# Patient Record
Sex: Female | Born: 1950 | Race: White | Hispanic: No | Smoking: Never smoker
Health system: Southern US, Community
[De-identification: ages and names within clinical notes are randomized; demographics above are authoritative.]

## PROBLEM LIST (undated history)

## (undated) DIAGNOSIS — F4310 Post-traumatic stress disorder, unspecified: Secondary | ICD-10-CM

## (undated) DIAGNOSIS — I10 Essential (primary) hypertension: Secondary | ICD-10-CM

## (undated) DIAGNOSIS — F419 Anxiety disorder, unspecified: Secondary | ICD-10-CM

## (undated) DIAGNOSIS — F32A Depression, unspecified: Secondary | ICD-10-CM

## (undated) DIAGNOSIS — F1010 Alcohol abuse, uncomplicated: Secondary | ICD-10-CM

## (undated) HISTORY — DX: Post-traumatic stress disorder, unspecified: F43.10

## (undated) HISTORY — PX: CYSTECTOMY: SUR359

## (undated) HISTORY — DX: Depression, unspecified: F32.A

## (undated) HISTORY — DX: Anxiety disorder, unspecified: F41.9

---

## 2006-03-17 ENCOUNTER — Encounter: Admission: RE | Admit: 2006-03-17 | Discharge: 2006-03-17 | Payer: Self-pay | Admitting: Sports Medicine

## 2006-09-23 ENCOUNTER — Encounter: Admission: RE | Admit: 2006-09-23 | Discharge: 2006-09-23 | Payer: Self-pay | Admitting: Orthopedic Surgery

## 2007-08-22 IMAGING — US US ASPIRATION
1 series · 10 of 10 positions shown · non-contrast
Comparison: none

CLINICAL DATA: Right knee Baker's cysts. Previously aspirated in [place].

ULTRASOUND GUIDED RIGHT BAKER'S CYST ASPIRATION AND STEROID INJECTION:

[Series 1: unknown · 0.07mm/px · 10 of 10 slices shown]
[im 1/10]
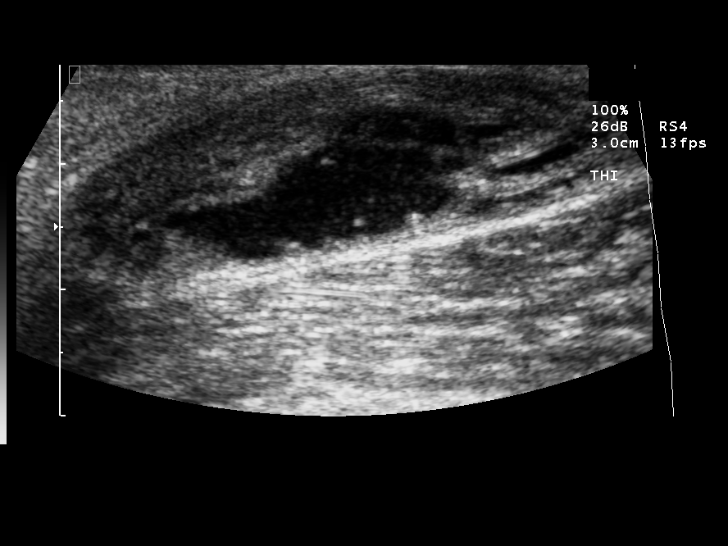
[im 2/10]
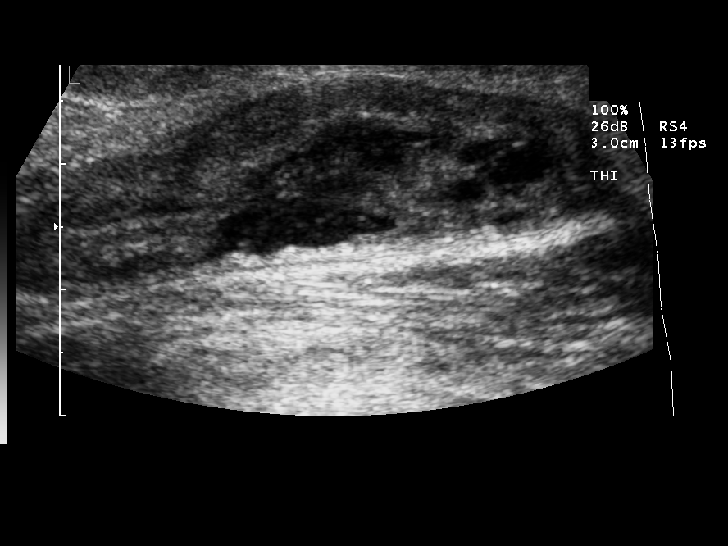
[im 3/10]
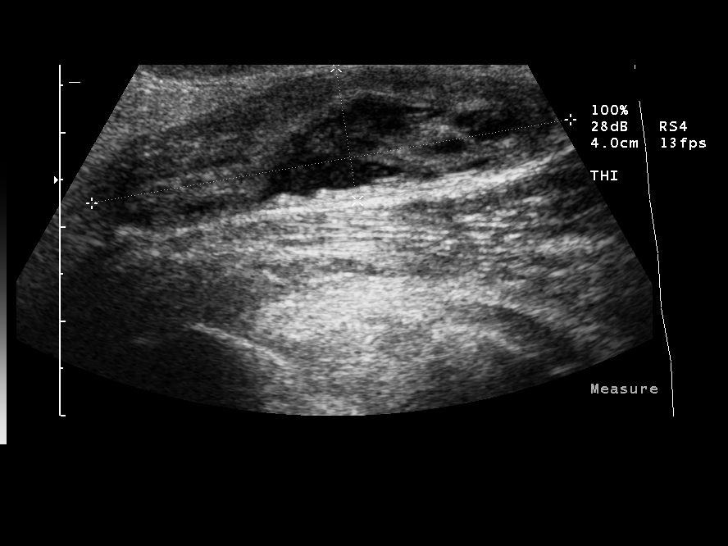
[im 4/10]
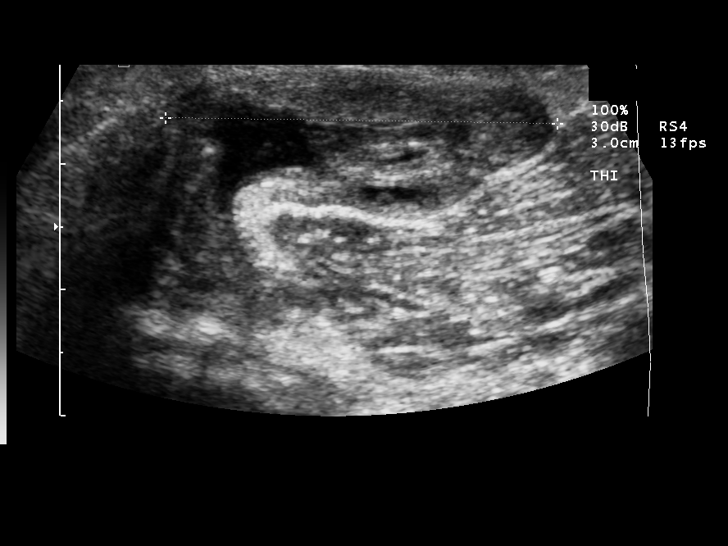
[im 5/10]
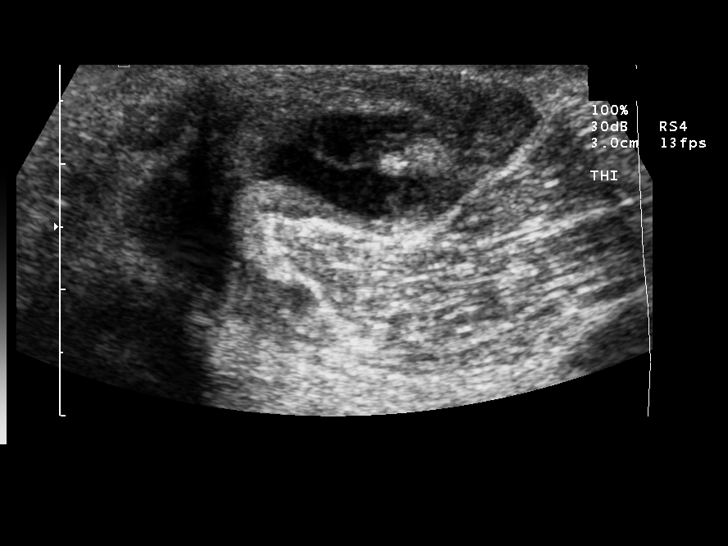
[im 6/10]
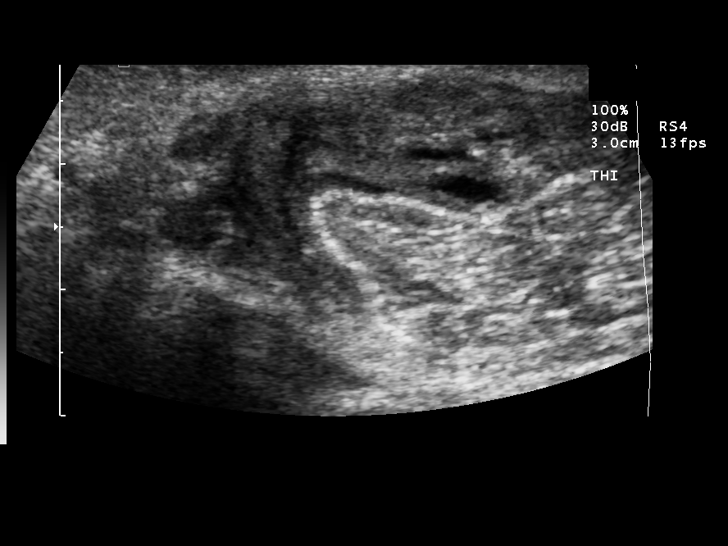
[im 7/10]
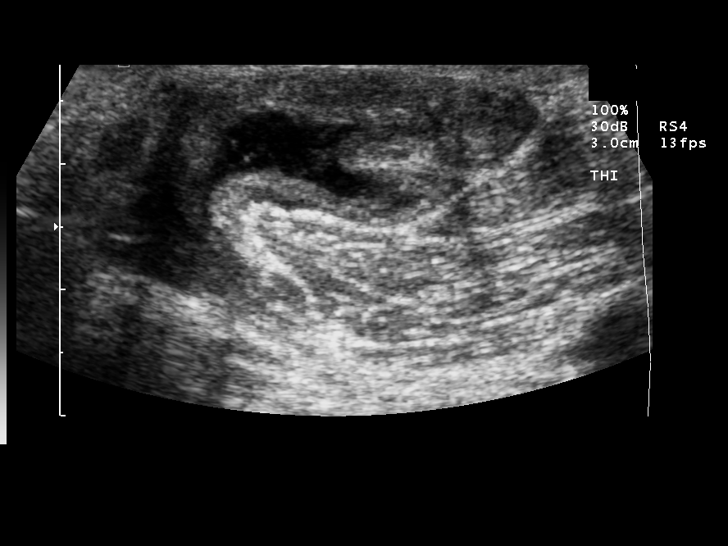
[im 8/10]
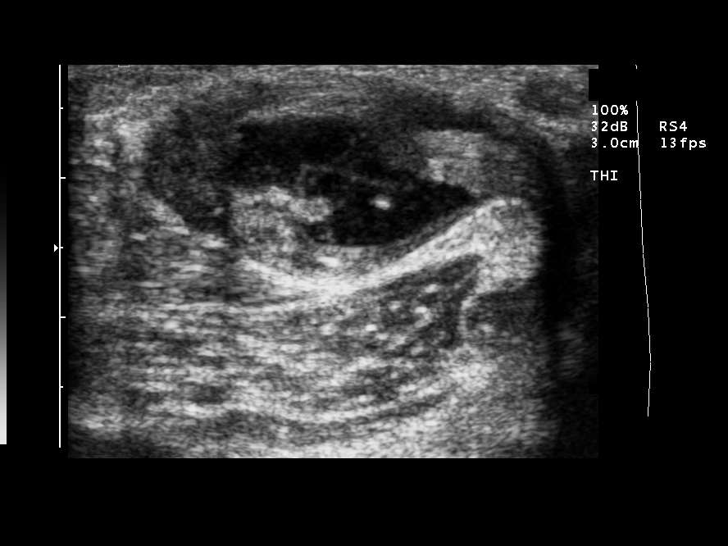
[im 9/10]
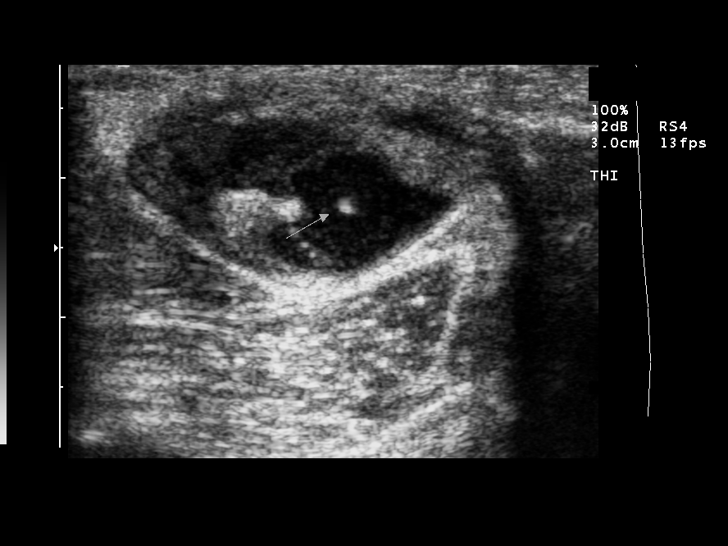
[im 10/10]
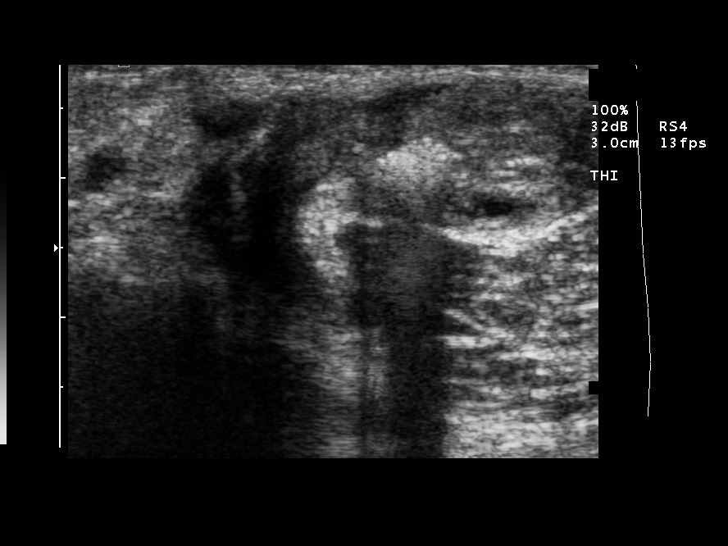

[10 of 10 positions shown; findings below may reference images not displayed]

FINDINGS: Written informed consent was obtained from the patient. The patient
was placed prone and the posterior right knee was prepped and draped in sterile
fashion. The skin was anesthetized with 1% lidocaine. Under ultrasound guidance,
an 18-gauge needle was advanced into the right knee Baker's cyst. Approximately
2-3 cc of somewhat thick amber-colored fluid was aspirated. Ultrasound shows
near complete collapse of the cyst. 80 mg of Depo-Medrol was injected into the
Baker's cyst.

The patient tolerated the procedure well.
IMPRESSION: Successful aspiration and steroid injection of the right knee Baker's cyst as
described above.

## 2022-11-06 ENCOUNTER — Emergency Department (HOSPITAL_COMMUNITY)
Admission: EM | Admit: 2022-11-06 | Discharge: 2022-11-06 | Disposition: A | Discharge status: Home / Self Care | Payer: Medicare PPO | Attending: Emergency Medicine | Admitting: Emergency Medicine

## 2022-11-06 ENCOUNTER — Other Ambulatory Visit: Payer: Self-pay

## 2022-11-06 ENCOUNTER — Encounter (HOSPITAL_COMMUNITY): Payer: Self-pay

## 2022-11-06 DIAGNOSIS — I10 Essential (primary) hypertension: Secondary | ICD-10-CM | POA: Insufficient documentation

## 2022-11-06 DIAGNOSIS — Y902 Blood alcohol level of 40-59 mg/100 ml: Secondary | ICD-10-CM | POA: Insufficient documentation

## 2022-11-06 DIAGNOSIS — E876 Hypokalemia: Secondary | ICD-10-CM | POA: Diagnosis not present

## 2022-11-06 DIAGNOSIS — F1010 Alcohol abuse, uncomplicated: Secondary | ICD-10-CM | POA: Insufficient documentation

## 2022-11-06 HISTORY — DX: Essential (primary) hypertension: I10

## 2022-11-06 HISTORY — DX: Alcohol abuse, uncomplicated: F10.10

## 2022-11-06 LAB — CBC WITH DIFFERENTIAL/PLATELET
Abs Immature Granulocytes: 0.04 10*3/uL (ref 0.00–0.07)
Basophils Absolute: 0.1 10*3/uL (ref 0.0–0.1)
Basophils Relative: 2 %
Eosinophils Absolute: 0.1 10*3/uL (ref 0.0–0.5)
Eosinophils Relative: 1 %
HCT: 39.7 % (ref 36.0–46.0)
Hemoglobin: 13.9 g/dL (ref 12.0–15.0)
Immature Granulocytes: 1 %
Lymphocytes Relative: 25 %
Lymphs Abs: 1.8 10*3/uL (ref 0.7–4.0)
MCH: 32.9 pg (ref 26.0–34.0)
MCHC: 35 g/dL (ref 30.0–36.0)
MCV: 93.9 fL (ref 80.0–100.0)
Monocytes Absolute: 0.5 10*3/uL (ref 0.1–1.0)
Monocytes Relative: 7 %
Neutro Abs: 4.5 10*3/uL (ref 1.7–7.7)
Neutrophils Relative %: 64 %
Platelets: 314 10*3/uL (ref 150–400)
RBC: 4.23 MIL/uL (ref 3.87–5.11)
RDW: 12.6 % (ref 11.5–15.5)
WBC: 7 10*3/uL (ref 4.0–10.5)
nRBC: 0 % (ref 0.0–0.2)

## 2022-11-06 LAB — COMPREHENSIVE METABOLIC PANEL
ALT: 38 U/L (ref 0–44)
AST: 45 U/L — ABNORMAL HIGH (ref 15–41)
Albumin: 4.3 g/dL (ref 3.5–5.0)
Alkaline Phosphatase: 74 U/L (ref 38–126)
Anion gap: 16 — ABNORMAL HIGH (ref 5–15)
BUN: 13 mg/dL (ref 8–23)
CO2: 21 mmol/L — ABNORMAL LOW (ref 22–32)
Calcium: 9.4 mg/dL (ref 8.9–10.3)
Chloride: 100 mmol/L (ref 98–111)
Creatinine, Ser: 0.48 mg/dL (ref 0.44–1.00)
GFR, Estimated: >60 mL/min (ref >60)
Glucose, Bld: 117 mg/dL — ABNORMAL HIGH (ref 70–99)
Potassium: 3 mmol/L — ABNORMAL LOW (ref 3.5–5.1)
Sodium: 137 mmol/L (ref 135–145)
Total Bilirubin: 1.6 mg/dL — ABNORMAL HIGH (ref 0.3–1.2)
Total Protein: 7.6 g/dL (ref 6.5–8.1)

## 2022-11-06 LAB — ETHANOL: Alcohol, Ethyl (B): 54 mg/dL — ABNORMAL HIGH (ref <10)

## 2022-11-06 MED ORDER — SODIUM CHLORIDE 0.9 % IV BOLUS
1000.0000 mL | Freq: Once | INTRAVENOUS | Status: AC
  Administered 2022-11-06: 1000 mL via INTRAVENOUS

## 2022-11-06 MED ORDER — LORAZEPAM 2 MG/ML IJ SOLN
1.0000 mg | Freq: Once | INTRAMUSCULAR | Status: AC
  Administered 2022-11-06: 1 mg via INTRAVENOUS
  Filled 2022-11-06: qty 1

## 2022-11-06 MED ORDER — KETOROLAC TROMETHAMINE 15 MG/ML IJ SOLN
15.0000 mg | Freq: Once | INTRAMUSCULAR | Status: AC
  Administered 2022-11-06: 15 mg via INTRAVENOUS
  Filled 2022-11-06: qty 1

## 2022-11-06 MED ORDER — ONDANSETRON HCL 4 MG/2ML IJ SOLN
4.0000 mg | Freq: Once | INTRAMUSCULAR | Status: AC
  Administered 2022-11-06: 4 mg via INTRAVENOUS
  Filled 2022-11-06: qty 2

## 2022-11-06 MED ORDER — POTASSIUM CHLORIDE CRYS ER 20 MEQ PO TBCR
40.0000 meq | EXTENDED_RELEASE_TABLET | Freq: Once | ORAL | Status: AC
Start: 2022-11-06 — End: 2022-11-06
  Administered 2022-11-06: 40 meq via ORAL
  Filled 2022-11-06: qty 2

## 2022-11-06 MED ORDER — THIAMINE HCL 100 MG/ML IJ SOLN
100.0000 mg | Freq: Once | INTRAMUSCULAR | Status: AC
  Administered 2022-11-06: 100 mg via INTRAVENOUS
  Filled 2022-11-06: qty 2

## 2022-11-06 NOTE — ED Triage Notes (Addendum)
Pt BIB EMS with reports of ETOH. Pt states that she feels shaky and thinks that she has seizures. Pt recently split up with her husband and has been drinking a bunch of wine. Last drink was last night. Clovis Cao)

## 2022-11-06 NOTE — ED Provider Notes (Signed)
[facility]-EMERGENCY DEPT Provider Note: Lowella Dell, MD, FACEP  CSN: [id number] MRN: [id number] ARRIVAL: 11/06/22 at 0209 ROOM: WA12/WA12   CHIEF COMPLAINT  Alcohol Intoxication   HISTORY OF PRESENT ILLNESS  11/06/22 4:49 AM Stephanie Riley is a 71 y.o. female who recently split up with her husband (due to a family problem involving their daughter) and has been drinking wine recently to cope with the associated stress.  Her last drink was yesterday evening.  She is feeling shaky but not severely so.  She has a headache on the top of her head which is not severe and this has been accompanied with nausea.  She is not sure if this is due to drinking too much.  She admits to perhaps drinking more alcohol than she should and would like referrals to treatment programs.   Past Medical History:  Diagnosis Date   Alcohol abuse    HTN (hypertension)     History reviewed. No pertinent surgical history.  History reviewed. No pertinent family history.  Social History   Tobacco Use   Smoking status: Never   Smokeless tobacco: Never  Substance Use Topics   Alcohol use: Yes   Drug use: Never    Prior to Admission medications   Not on File    Allergies Patient has no allergy information on record.   REVIEW OF SYSTEMS  Negative except as noted here or in the History of Present Illness.   PHYSICAL EXAMINATION  Initial Vital Signs Blood pressure (!) 164/89, pulse 90, temperature 98.5 F (36.9 C), temperature source Oral, resp. rate 16, height 5\' 7"  (1.702 m), weight 63.5 kg, SpO2 96 %.  Examination General: Well-developed, well-nourished female in no acute distress; appearance consistent with age of record HENT: normocephalic; atraumatic Eyes: Normal appearance; no nystagmus Neck: supple Heart: regular rate and rhythm Lungs: clear to auscultation bilaterally Abdomen: soft; nondistended; nontender; bowel sounds present Extremities: No deformity; full range of motion; pulses  normal Neurologic: Awake, alert and oriented; motor function intact in all extremities and symmetric; no facial droop; mildly tremulous Skin: Warm and dry Psychiatric: Normal mood and affect   RESULTS  Summary of this visit's results, reviewed and interpreted by myself:   EKG Interpretation  Date/Time:    Ventricular Rate:    PR Interval:    QRS Duration:   QT Interval:    QTC Calculation:   R Axis:     Text Interpretation:         Laboratory Studies: Results for orders placed or performed during the hospital encounter of 11/06/22 (from the past 24 hour(s))  CBC with Differential     Status: None   Collection Time: 11/06/22  2:19 AM  Result Value Ref Range   WBC 7.0 4.0 - 10.5 K/uL   RBC 4.23 3.87 - 5.11 MIL/uL   Hemoglobin 13.9 12.0 - 15.0 g/dL   HCT 14/01/23 65.5 - 37.4 %   MCV 93.9 80.0 - 100.0 fL   MCH 32.9 26.0 - 34.0 pg   MCHC 35.0 30.0 - 36.0 g/dL   RDW 82.7 07.8 - 67.5 %   Platelets 314 150 - 400 K/uL   nRBC 0.0 0.0 - 0.2 %   Neutrophils Relative % 64 %   Neutro Abs 4.5 1.7 - 7.7 K/uL   Lymphocytes Relative 25 %   Lymphs Abs 1.8 0.7 - 4.0 K/uL   Monocytes Relative 7 %   Monocytes Absolute 0.5 0.1 - 1.0 K/uL   Eosinophils Relative 1 %  Eosinophils Absolute 0.1 0.0 - 0.5 K/uL   Basophils Relative 2 %   Basophils Absolute 0.1 0.0 - 0.1 K/uL   Immature Granulocytes 1 %   Abs Immature Granulocytes 0.04 0.00 - 0.07 K/uL  Comprehensive metabolic panel     Status: Abnormal   Collection Time: 11/06/22  2:19 AM  Result Value Ref Range   Sodium 137 135 - 145 mmol/L   Potassium 3.0 (L) 3.5 - 5.1 mmol/L   Chloride 100 98 - 111 mmol/L   CO2 21 (L) 22 - 32 mmol/L   Glucose, Bld 117 (H) 70 - 99 mg/dL   BUN 13 8 - 23 mg/dL   Creatinine, Ser 6.25 0.44 - 1.00 mg/dL   Calcium 9.4 8.9 - 63.8 mg/dL   Total Protein 7.6 6.5 - 8.1 g/dL   Albumin 4.3 3.5 - 5.0 g/dL   AST 45 (H) 15 - 41 U/L   ALT 38 0 - 44 U/L   Alkaline Phosphatase 74 38 - 126 U/L   Total Bilirubin 1.6  (H) 0.3 - 1.2 mg/dL   GFR, Estimated >93 >73 mL/min   Anion gap 16 (H) 5 - 15  Ethanol     Status: Abnormal   Collection Time: 11/06/22  2:19 AM  Result Value Ref Range   Alcohol, Ethyl (B) 54 (H) <10 mg/dL   Imaging Studies: No results found.  ED COURSE and MDM  Nursing notes, initial and subsequent vitals signs, including pulse oximetry, reviewed and interpreted by myself.  Vitals:   11/06/22 0217 11/06/22 0218 11/06/22 0625  BP: (!) 164/89  (!) 152/74  Pulse: 90  98  Resp: 16  16  Temp: 98.5 F (36.9 C)  98 F (36.7 C)  TempSrc: Oral  Oral  SpO2: 96%  98%  Weight:  63.5 kg   Height:  5\' 7"  (1.702 m)    Medications  sodium chloride 0.9 % bolus 1,000 mL (1,000 mLs Intravenous Bolus 11/06/22 0532)  LORazepam (ATIVAN) injection 1 mg (1 mg Intravenous Given 11/06/22 0531)  ketorolac (TORADOL) 15 MG/ML injection 15 mg (15 mg Intravenous Given 11/06/22 0532)  thiamine (VITAMIN B1) injection 100 mg (100 mg Intravenous Given 11/06/22 0531)  ondansetron (ZOFRAN) injection 4 mg (4 mg Intravenous Given 11/06/22 0531)  potassium chloride SA (KLOR-CON M) CR tablet 40 mEq (40 mEq Oral Given 11/06/22 0532)   6:51 AM Patient feeling significantly better after IV fluids and medications.  We will provide outpatient referral options.  She does not appear to be in acute alcohol withdrawal and not in need of medical admission at this time.   PROCEDURES  Procedures   ED DIAGNOSES     ICD-10-CM   1. Alcohol abuse  F10.10     2. Hypokalemia  E87.6          Kaysan Peixoto, 14/1/23, MD 11/06/22 [phone]

## 2023-02-11 ENCOUNTER — Ambulatory Visit (HOSPITAL_COMMUNITY)
Admission: EM | Admit: 2023-02-11 | Discharge: 2023-02-17 | Disposition: A | Discharge status: Home / Self Care | Payer: Medicare PPO | Attending: Student | Admitting: Student

## 2023-02-11 DIAGNOSIS — I10 Essential (primary) hypertension: Secondary | ICD-10-CM | POA: Insufficient documentation

## 2023-02-11 DIAGNOSIS — F909 Attention-deficit hyperactivity disorder, unspecified type: Secondary | ICD-10-CM | POA: Insufficient documentation

## 2023-02-11 DIAGNOSIS — F13239 Sedative, hypnotic or anxiolytic dependence with withdrawal, unspecified: Secondary | ICD-10-CM | POA: Insufficient documentation

## 2023-02-11 DIAGNOSIS — Z1152 Encounter for screening for COVID-19: Secondary | ICD-10-CM | POA: Insufficient documentation

## 2023-02-11 DIAGNOSIS — F13930 Sedative, hypnotic or anxiolytic use, unspecified with withdrawal, uncomplicated: Secondary | ICD-10-CM

## 2023-02-11 DIAGNOSIS — F329 Major depressive disorder, single episode, unspecified: Secondary | ICD-10-CM | POA: Diagnosis not present

## 2023-02-11 DIAGNOSIS — Z79899 Other long term (current) drug therapy: Secondary | ICD-10-CM | POA: Diagnosis not present

## 2023-02-11 DIAGNOSIS — F419 Anxiety disorder, unspecified: Secondary | ICD-10-CM | POA: Diagnosis not present

## 2023-02-11 DIAGNOSIS — F1020 Alcohol dependence, uncomplicated: Secondary | ICD-10-CM | POA: Diagnosis present

## 2023-02-11 LAB — RESP PANEL BY RT-PCR (RSV, FLU A&B, COVID)  RVPGX2
Influenza A by PCR: NEGATIVE
Influenza B by PCR: NEGATIVE
Resp Syncytial Virus by PCR: NEGATIVE
SARS Coronavirus 2 by RT PCR: NEGATIVE

## 2023-02-11 LAB — COMPREHENSIVE METABOLIC PANEL
ALT: 34 U/L (ref 0–44)
AST: 35 U/L (ref 15–41)
Albumin: 4.7 g/dL (ref 3.5–5.0)
Alkaline Phosphatase: 91 U/L (ref 38–126)
Anion gap: 15 (ref 5–15)
BUN: 7 mg/dL — ABNORMAL LOW (ref 8–23)
CO2: 24 mmol/L (ref 22–32)
Calcium: 9.2 mg/dL (ref 8.9–10.3)
Chloride: 97 mmol/L — ABNORMAL LOW (ref 98–111)
Creatinine, Ser: 0.47 mg/dL (ref 0.44–1.00)
GFR, Estimated: >60 mL/min (ref >60)
Glucose, Bld: 97 mg/dL (ref 70–99)
Potassium: 3.2 mmol/L — ABNORMAL LOW (ref 3.5–5.1)
Sodium: 136 mmol/L (ref 135–145)
Total Bilirubin: 1.7 mg/dL — ABNORMAL HIGH (ref 0.3–1.2)
Total Protein: 7.7 g/dL (ref 6.5–8.1)

## 2023-02-11 LAB — ETHANOL: Alcohol, Ethyl (B): 72 mg/dL — ABNORMAL HIGH (ref <10)

## 2023-02-11 LAB — CBC
HCT: 38.8 % (ref 36.0–46.0)
Hemoglobin: 13.4 g/dL (ref 12.0–15.0)
MCH: 31.7 pg (ref 26.0–34.0)
MCHC: 34.5 g/dL (ref 30.0–36.0)
MCV: 91.7 fL (ref 80.0–100.0)
Platelets: 422 10*3/uL — ABNORMAL HIGH (ref 150–400)
RBC: 4.23 MIL/uL (ref 3.87–5.11)
RDW: 13 % (ref 11.5–15.5)
WBC: 9.2 10*3/uL (ref 4.0–10.5)
nRBC: 0 % (ref 0.0–0.2)

## 2023-02-11 LAB — MAGNESIUM: Magnesium: 2 mg/dL (ref 1.7–2.4)

## 2023-02-11 LAB — TSH: TSH: 3.115 u[IU]/mL (ref 0.350–4.500)

## 2023-02-11 LAB — VITAMIN B12: Vitamin B-12: 252 pg/mL (ref 180–914)

## 2023-02-11 LAB — PHOSPHORUS: Phosphorus: 3.7 mg/dL (ref 2.5–4.6)

## 2023-02-11 LAB — FOLATE: Folate: 6.3 ng/mL (ref >5.9)

## 2023-02-11 MED ORDER — MAGNESIUM HYDROXIDE 400 MG/5ML PO SUSP: 30.0000 mL | Freq: Every day | ORAL | Status: DC | PRN

## 2023-02-11 MED ORDER — LORAZEPAM 1 MG PO TABS
1.0000 mg | ORAL_TABLET | Freq: Four times a day (QID) | ORAL | Status: AC | PRN
  Administered 2023-02-11 – 2023-02-12 (×2): 1 mg via ORAL
  Filled 2023-02-11 (×2): qty 1

## 2023-02-11 MED ORDER — ONDANSETRON 4 MG PO TBDP
ORAL_TABLET | ORAL | Status: AC
  Filled 2023-02-11: qty 1

## 2023-02-11 MED ORDER — ADULT MULTIVITAMIN W/MINERALS CH
1.0000 | ORAL_TABLET | Freq: Every day | ORAL | Status: DC
  Administered 2023-02-11 – 2023-02-17 (×7): 1 via ORAL
  Filled 2023-02-11 (×7): qty 1

## 2023-02-11 MED ORDER — AMLODIPINE BESYLATE 10 MG PO TABS
10.0000 mg | ORAL_TABLET | Freq: Every day | ORAL | Status: DC
  Administered 2023-02-11 – 2023-02-16 (×6): 10 mg via ORAL
  Filled 2023-02-11 (×6): qty 1

## 2023-02-11 MED ORDER — ONDANSETRON 4 MG PO TBDP
4.0000 mg | ORAL_TABLET | Freq: Once | ORAL | Status: AC
  Administered 2023-02-11: 4 mg via ORAL

## 2023-02-11 MED ORDER — CLONAZEPAM 0.5 MG PO TABS
0.5000 mg | ORAL_TABLET | Freq: Two times a day (BID) | ORAL | Status: DC
  Administered 2023-02-11 – 2023-02-17 (×12): 0.5 mg via ORAL
  Filled 2023-02-11 (×12): qty 1

## 2023-02-11 MED ORDER — ACETAMINOPHEN 325 MG PO TABS: 650.0000 mg | ORAL_TABLET | Freq: Four times a day (QID) | ORAL | Status: DC | PRN

## 2023-02-11 MED ORDER — POTASSIUM CHLORIDE CRYS ER 20 MEQ PO TBCR
30.0000 meq | EXTENDED_RELEASE_TABLET | Freq: Once | ORAL | Status: AC
  Administered 2023-02-11: 30 meq via ORAL
  Filled 2023-02-11: qty 1

## 2023-02-11 MED ORDER — HYDROXYZINE HCL 25 MG PO TABS: 25.0000 mg | ORAL_TABLET | Freq: Three times a day (TID) | ORAL | Status: DC | PRN

## 2023-02-11 MED ORDER — ALUM & MAG HYDROXIDE-SIMETH 200-200-20 MG/5ML PO SUSP
30.0000 mL | ORAL | Status: DC | PRN
  Administered 2023-02-13: 30 mL via ORAL
  Filled 2023-02-11: qty 30

## 2023-02-11 MED ORDER — CLONIDINE HCL 0.1 MG PO TABS: 0.1000 mg | ORAL_TABLET | Freq: Once | ORAL | Status: DC

## 2023-02-11 MED ORDER — LOPERAMIDE HCL 2 MG PO CAPS
2.0000 mg | ORAL_CAPSULE | ORAL | Status: AC | PRN
  Administered 2023-02-12 – 2023-02-14 (×3): 4 mg via ORAL
  Filled 2023-02-11 (×3): qty 2

## 2023-02-11 MED ORDER — THIAMINE HCL 100 MG/ML IJ SOLN
100.0000 mg | Freq: Once | INTRAMUSCULAR | Status: AC
  Administered 2023-02-11: 100 mg via INTRAMUSCULAR
  Filled 2023-02-11: qty 2

## 2023-02-11 MED ORDER — LOSARTAN POTASSIUM 50 MG PO TABS
100.0000 mg | ORAL_TABLET | Freq: Every day | ORAL | Status: DC
  Administered 2023-02-12 – 2023-02-17 (×6): 100 mg via ORAL
  Filled 2023-02-11 (×6): qty 2

## 2023-02-11 MED ORDER — HYDROXYZINE HCL 25 MG PO TABS
25.0000 mg | ORAL_TABLET | Freq: Four times a day (QID) | ORAL | Status: AC | PRN
  Administered 2023-02-11 – 2023-02-12 (×4): 25 mg via ORAL
  Filled 2023-02-11 (×5): qty 1

## 2023-02-11 MED ORDER — HYDROCHLOROTHIAZIDE 12.5 MG PO TABS
12.5000 mg | ORAL_TABLET | Freq: Every day | ORAL | Status: DC
  Administered 2023-02-12 – 2023-02-17 (×6): 12.5 mg via ORAL
  Filled 2023-02-11 (×6): qty 1

## 2023-02-11 MED ORDER — LOSARTAN POTASSIUM-HCTZ 100-12.5 MG PO TABS: 1.0000 | ORAL_TABLET | Freq: Every day | ORAL | Status: DC

## 2023-02-11 MED ORDER — THIAMINE MONONITRATE 100 MG PO TABS
100.0000 mg | ORAL_TABLET | Freq: Every day | ORAL | Status: DC
  Administered 2023-02-12 – 2023-02-17 (×6): 100 mg via ORAL
  Filled 2023-02-11 (×6): qty 1

## 2023-02-11 MED ORDER — ONDANSETRON 4 MG PO TBDP
4.0000 mg | ORAL_TABLET | Freq: Four times a day (QID) | ORAL | Status: AC | PRN
  Administered 2023-02-12: 4 mg via ORAL
  Filled 2023-02-11: qty 1

## 2023-02-11 NOTE — BH Assessment (Signed)
Stephanie Riley presented to [facility] with EMS with report that patient was a direct admit to [facility]. TTS did not complete triage however, pt was seen by provider and admitted to [facility].

## 2023-02-11 NOTE — ED Notes (Signed)
Patient arrived on unit. Patient conversing with pharmacy. Patient calm and relaxed in assigned are. Patient safe on unit with proper behavior.

## 2023-02-11 NOTE — ED Notes (Signed)
Pt is in the bed sleeping. Respirations are even and unlabored. No acute distress noted. Will continue to monitor for safety. 

## 2023-02-11 NOTE — ED Notes (Signed)
Pt awake, alert disorganized. She has had an incontinent episode and is having difficulty following directions. Tremors noted. Medicated with Klonopin per order. Assisted pt in shower and with clean scrubs. Bed linens replaced. Instructed pt to rest in recliner bed. She denies SI/HI/AVH.  Will continue to monitor for safety

## 2023-02-11 NOTE — ED Provider Notes (Signed)
[facility] Continuous Assessment Admission H&P  Date: 02/11/23 Patient Name: Stephanie Riley MRN: [id number] Chief Complaint: "Wants help with alcohol detox after long term BZD use."  Diagnoses:  Final diagnoses:  None    HPI: Stephanie Riley is a 72 year old female BIBEMS from home for admission to the [facility] for benzodiazepine and alcohol withdrawal. 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 After 30 years on Klonopin, the decision was made to taper off during that admission. Patient was discharged with Ativan 0.5 mg BID x 15 days, which completed on 2/24.Since then, she has had worsening anxiety, and has thus increased her alcohol consumption. She now drinks anywhere between "a couple of beers or an entire box of wine" on most days.   EMT also reports that patient has been tremulous, although steady in gait. Patient reports that she would like help with returning to taking klonopin and discontinuing alcohol intake, pursuing rehabilitation.   Patient's current outpatient medications: Trazodone 100 mg qHS Zoloft 100 mg daily Losartan-HCTZ 100-12.5 mg daily Wellbutrin XL 150 mg daily Adderall '40mg'$  daily Amlodipine 10 mg daily  Additionally, she was discharged from [place] with a 15-day supply of the following: Zyprexa 15 mg Ativan 0.5 mg BID Lamictal 50 mg  Patient denies SI, HI, AVH today, and does not voice delusions.   Total Time spent with patient: 30 minutes  Musculoskeletal  Strength & Muscle Tone: within normal limits Gait & Station:  assessed on hospital stretcher Patient leans:  assessed on hospital stretcher  Psychiatric Specialty Exam  Presentation General Appearance: Appropriate for Environment; Casual  Eye Contact:Good  Speech:Clear and Coherent; Slow  Speech Volume:Normal  Handedness:Right   Mood and Affect  Mood:Anxious  Affect:Congruent   Thought Process  Thought Processes:Coherent; Goal  Directed  Descriptions of Associations:Intact  Orientation:Full (Time, Place and Person)  Thought Content:WDL; Logical    Hallucinations:Hallucinations: None  Ideas of Reference:None  Suicidal Thoughts:Suicidal Thoughts: No  Homicidal Thoughts:Homicidal Thoughts: No   Sensorium  Memory:Immediate Good; Recent Fair  Judgment:Impaired  Insight:Lacking   Executive Functions  Concentration:Fair  Attention Span:Fair  [place]   Psychomotor Activity  Psychomotor Activity:Psychomotor Activity: Normal (No tremors, no asterixis.)   Assets  Assets:Desire for Improvement; Housing   Sleep  Sleep:Sleep: Fair   Nutritional Assessment (For OBS and FBC admissions only) Has the patient had a weight loss or gain of 10 pounds or more in the last 3 months?: No Has the patient had a decrease in food intake/or appetite?: No Does the patient have dental problems?: No Does the patient have eating habits or behaviors that may be indicators of an eating disorder including binging or inducing vomiting?: No Has the patient recently lost weight without trying?: 0 Has the patient been eating poorly because of a decreased appetite?: 0 Malnutrition Screening Tool Score: 0    Physical Exam Vitals reviewed.  Constitutional:      General: She is not in acute distress.    Appearance: She is diaphoretic.  HENT:     Head: Normocephalic and atraumatic.     Mouth/Throat:     Mouth: Mucous membranes are moist.     Pharynx: Oropharynx is clear.  Pulmonary:     Effort: Pulmonary effort is normal.  Skin:    General: Skin is warm.  Neurological:     Mental Status: She is alert and oriented to person, place, and time.     Comments:  No tremors of outstretched hands nor periorally. No asterixis. Strength of BUE intact. Some abnormal perioral movements c/w tardive dyskinesia    Review of Systems  Constitutional:  Positive for diaphoresis.  Negative for chills.  Genitourinary:  Positive for urgency.       Incontinence  Neurological:  Positive for dizziness and tremors. Negative for headaches.    Blood pressure (!) 158/80, pulse 87, temperature 98.9 F (37.2 C), temperature source Oral, resp. rate 18, SpO2 98 %. There is no height or weight on file to calculate BMI.  Past Psychiatric History: Documented Anxiety, alcohol use, ADHD- inattentive type   Is the patient at risk to self? No  Has the patient been a risk to self in the past 6 months? No .    Has the patient been a risk to self within the distant past? No   Is the patient a risk to others? No   Has the patient been a risk to others in the past 6 months? No   Has the patient been a risk to others within the distant past? No   Past Medical History: See chart  Family History: See chart  Social History: Separated from husband. Lives alone.   Last Labs:  Admission on 11/06/2022, Discharged on 11/06/2022  Component Date Value Ref Range Status  . WBC 11/06/2022 7.0  4.0 - 10.5 K/uL Final  . RBC 11/06/2022 4.23  3.87 - 5.11 MIL/uL Final  . Hemoglobin 11/06/2022 13.9  12.0 - 15.0 g/dL Final  . HCT 11/06/2022 39.7  36.0 - 46.0 % Final  . MCV 11/06/2022 93.9  80.0 - 100.0 fL Final  . MCH 11/06/2022 32.9  26.0 - 34.0 pg Final  . MCHC 11/06/2022 35.0  30.0 - 36.0 g/dL Final  . RDW 11/06/2022 12.6  11.5 - 15.5 % Final  . Platelets 11/06/2022 314  150 - 400 K/uL Final  . nRBC 11/06/2022 0.0  0.0 - 0.2 % Final  . Neutrophils Relative % 11/06/2022 64  % Final  . Neutro Abs 11/06/2022 4.5  1.7 - 7.7 K/uL Final  . Lymphocytes Relative 11/06/2022 25  % Final  . Lymphs Abs 11/06/2022 1.8  0.7 - 4.0 K/uL Final  . Monocytes Relative 11/06/2022 7  % Final  . Monocytes Absolute 11/06/2022 0.5  0.1 - 1.0 K/uL Final  . Eosinophils Relative 11/06/2022 1  % Final  . Eosinophils Absolute 11/06/2022 0.1  0.0 - 0.5 K/uL Final  . Basophils Relative 11/06/2022 2  % Final  . Basophils  Absolute 11/06/2022 0.1  0.0 - 0.1 K/uL Final  . Immature Granulocytes 11/06/2022 1  % Final  . Abs Immature Granulocytes 11/06/2022 0.04  0.00 - 0.07 K/uL Final   Performed at [facility], [place] [address], [place], [place] [address]  . Sodium 11/06/2022 137  135 - 145 mmol/L Final  . Potassium 11/06/2022 3.0 (L)  3.5 - 5.1 mmol/L Final  . Chloride 11/06/2022 100  98 - 111 mmol/L Final  . CO2 11/06/2022 21 (L)  22 - 32 mmol/L Final  . Glucose, Bld 11/06/2022 117 (H)  70 - 99 mg/dL Final   Glucose reference range applies only to samples taken after fasting for at least 8 hours.  . BUN 11/06/2022 13  8 - 23 mg/dL Final  . Creatinine, Ser 11/06/2022 0.48  0.44 - 1.00 mg/dL Final  . Calcium 11/06/2022 9.4  8.9 - 10.3 mg/dL Final  . Total Protein 11/06/2022 7.6  6.5 - 8.1  g/dL Final  . Albumin 11/06/2022 4.3  3.5 - 5.0 g/dL Final  . AST 11/06/2022 45 (H)  15 - 41 U/L Final  . ALT 11/06/2022 38  0 - 44 U/L Final  . Alkaline Phosphatase 11/06/2022 74  38 - 126 U/L Final  . Total Bilirubin 11/06/2022 1.6 (H)  0.3 - 1.2 mg/dL Final  . GFR, Estimated 11/06/2022 >60  >60 mL/min Final   Comment: (NOTE) Calculated using the CKD-EPI Creatinine Equation (2021)   . Anion gap 11/06/2022 16 (H)  5 - 15 Final   Performed at [facility], [place] [address] [place], [place] [address]  . Alcohol, Ethyl (B) 11/06/2022 54 (H)  <10 mg/dL Final   Comment: (NOTE) Lowest detectable limit for serum alcohol is 10 mg/dL.  For medical purposes only. Performed at [facility], [place] [address], [place],  [address]     Allergies: Patient has no known allergies.  Medications:  Facility Ordered Medications  Medication  . [COMPLETED] ondansetron (ZOFRAN-ODT) disintegrating tablet 4 mg  . ondansetron (ZOFRAN-ODT) 4 MG disintegrating tablet  . acetaminophen (TYLENOL) tablet 650 mg  . alum & mag hydroxide-simeth (MAALOX/MYLANTA) 200-200-20 MG/5ML  suspension 30 mL  . magnesium hydroxide (MILK OF MAGNESIA) suspension 30 mL  . thiamine (VITAMIN B1) injection 100 mg  . [START ON 02/12/2023] thiamine (VITAMIN B1) tablet 100 mg  . multivitamin with minerals tablet 1 tablet  . LORazepam (ATIVAN) tablet 1 mg  . hydrOXYzine (ATARAX) tablet 25 mg  . loperamide (IMODIUM) capsule 2-4 mg  . ondansetron (ZOFRAN-ODT) disintegrating tablet 4 mg   PTA Medications  Medication Sig  . losartan-hydrochlorothiazide (HYZAAR) 100-12.5 MG tablet Take 1 tablet by mouth daily.  Marland Kitchen amphetamine-dextroamphetamine (ADDERALL) 20 MG tablet Take 20 mg by mouth 2 (two) times daily.  Marland Kitchen buPROPion (WELLBUTRIN XL) 150 MG 24 hr tablet Take 150 mg by mouth every morning.  Marland Kitchen amLODipine (NORVASC) 10 MG tablet Take 10 mg by mouth daily.  Marland Kitchen LORazepam (ATIVAN) 0.5 MG tablet Take 0.5 mg by mouth in the morning and at bedtime.  . sertraline (ZOLOFT) 100 MG tablet Take 200 mg by mouth daily.  . traZODone (DESYREL) 100 MG tablet Take 100 mg by mouth at bedtime.    Medical Decision Making  Patient brought in voluntarily via EMS. Due to age, longstanding BZD use history with recent discontinuation after short (2 week) taper, and use of other seizure-threshold lowering agents (Wellbutrin, Adderall, and alcohol), patient at high risk of decompensation. As she was brought in via EMS, will obtain labs and start CIWA monitoring with PRN Ativan and hydroxyzine. At this time, patient fully oriented, able to ambulate, and not tremulous. Will keep close monitoring if her. If labs are abnormal and patient appears to decompensate clinically, low threshold to send to the ED for management. If patient remains stable, will admit to [facility] for detox and residential substance use treatment.  Home medications: -Hold home Adderall and Wellbutrin in the setting of acute alcohol intoxication - Restart home antihypertensives: Losartan-HCTZ 100-12.5 mg and Amlodipine 10 mg - Restart home psychotropics: Zoloft  100 mg. Will move Trazodone 100 mg to PRN; use with caution in this elderly patient as alpha-1 adrenergic activity can make patients more prone to dizziness and falls.  -Start CIWA protocol for monitoring of withdrawal with IM thiamine x 1 and MVI replacement and Ativan 1mg  for scores >10   Clinical Course as of 02/12/23 0827  Fri Feb 12, 2023  0827 Temp:  98.4 F (36.9 C) [CC]    Clinical Course User Index [CC] Rosezetta Schlatter, MD    Recommendations  See above for recommendations.  Rosezetta Schlatter, MD 02/11/23  3:48 PM

## 2023-02-11 NOTE — ED Provider Notes (Signed)
Informed pt is hypertensive by nursing staff. B/P 195/102 at 1718. Was given ativan '1mg'$  at 1726. Spoke w/ pt who denies headache, dizziness, blurry vision. She states she does feel tremulous which she feels is associated with withdrawal. She states she takes amlodipine and losartan-hydrochlorothiazide for blood pressure at home. She states she took the losartan-hydrochlorothiazide today but did not take the amlodipine. Will order home amlodipine '10mg'$  for today and losartan-hydrochlorothiazide 100-12.'5mg'$  for tomorrow.

## 2023-02-11 NOTE — ED Notes (Signed)
Patient was admitted to OBS unit. Patient was brought in by EMS via stretcher. Patient is ambulatory but reported she was had an unsteady gait. Writer walked with patient to restroom due to patient voiding on the stretcher she reported she could not hold it. Patient was alert and oriented x 4. Patient reported she was nauseated. Patient had received Zofran 4 mg IV push from EMS and given 4 mg of Zofran from Probation officer. Patient reported she had drank 3 beers prior to calling EMS to pick her up. Patient was able to ambulate without difficulty. Patient was anxious and slightly jittery while her blood was being drawn and EKG. Patient denies SI/HI and AVH. Patient reported she was having a difficult time going through withdrawals from the benzos.

## 2023-02-11 NOTE — Progress Notes (Signed)
Patient remains incontinent of bowel and bladder since arrival to facility.  She is unsteady on her feet and considered to be a high fall risk.

## 2023-02-12 DIAGNOSIS — F329 Major depressive disorder, single episode, unspecified: Secondary | ICD-10-CM | POA: Diagnosis not present

## 2023-02-12 DIAGNOSIS — F1020 Alcohol dependence, uncomplicated: Secondary | ICD-10-CM

## 2023-02-12 DIAGNOSIS — F13930 Sedative, hypnotic or anxiolytic use, unspecified with withdrawal, uncomplicated: Secondary | ICD-10-CM

## 2023-02-12 HISTORY — DX: Sedative, hypnotic or anxiolytic use, unspecified with withdrawal, uncomplicated: F13.930

## 2023-02-12 MED ORDER — TRAZODONE HCL 100 MG PO TABS
100.0000 mg | ORAL_TABLET | Freq: Every evening | ORAL | Status: DC | PRN
  Administered 2023-02-13: 100 mg via ORAL
  Filled 2023-02-12: qty 1

## 2023-02-12 MED ORDER — SERTRALINE HCL 100 MG PO TABS
200.0000 mg | ORAL_TABLET | Freq: Every day | ORAL | Status: DC
  Administered 2023-02-12 – 2023-02-17 (×6): 200 mg via ORAL
  Filled 2023-02-12 (×6): qty 2

## 2023-02-12 NOTE — Progress Notes (Signed)
Patient complained of diarrhea and was given '4mg'$  imodium for symptoms relief.  Stephanie Riley
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
## 2023-02-12 NOTE — Progress Notes (Signed)
[name] CIWA was 2. °

## 2023-02-12 NOTE — ED Notes (Signed)
Pt a/o. Disorganized at times. Watching tv with peers. Denies SI/HI/AVH. Denies c/o withdrawal @ present time. No noted distress. Will continue to monitor for safety

## 2023-02-12 NOTE — ED Provider Notes (Signed)
The patient is a 72 year old female, with a history of alcohol use disorder and chronic benzodiazepine use.  No history of psychiatric admissions or psychiatric care documented in the medical record.  She presented to the [facility] behavioral urgent care reporting excessive alcohol use in the context of being taken off of her benzodiazepines.  On assessment 3/8, the patient exhibits a linear and logical thought process.  She appears confused at times during the interview, but this seems to be a significant improvement compared to her mental status yesterday.  Relevant social history obtained by the patient's report.  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  (Her story at times is not logical and I have a high suspicion that she has had severe alcohol use disorder for a long period of time).  She reports that she was separated from her husband in July but that he comes over "every day, walks the dog, and we chat".  She reports that they are doing marriage counseling right now.  The patient denies issues with depression recently and says she has no suicidal thoughts now or in the recent past.  She denies experiencing any withdrawal.  Her memory appears impaired, both short-term and long-term.  Regarding the plan, the patient states that she wants to remain on her longtime dose of Klonopin 0.5 mg twice daily.  She states that she then wants to follow-up outpatient with her primary care doctor and perhaps a psychiatrist.  She states that she does not have a problem with alcohol and is not interested in substance use treatment with residential rehab or CD IOP.  She requests to stay on Wellbutrin.  Discussed with her that we cannot prescribe Wellbutrin given her recent alcohol use issues and recent issues with benzodiazepine use, and the concern  for seizure as a result.   Corky Sox, MD PGY-2

## 2023-02-12 NOTE — ED Notes (Signed)
Pt sleeping in room. RR even and unlabored. Will continue to monitor for safety 

## 2023-02-12 NOTE — ED Notes (Signed)
Snacks given 

## 2023-02-12 NOTE — ED Notes (Signed)
Rn came to take patient to fbc.

## 2023-02-12 NOTE — ED Notes (Signed)
Patient alert and oriented x 3. Denies SI/HI/AVH. Denies intent or plan to harm self or others. Routine conducted according to faculty protocol. Encourage patient to notify staff with any needs or concerns. Patient verbalized agreement and understanding. Will continue to monitor for safety. 
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
## 2023-02-12 NOTE — ED Notes (Signed)
Rn gave report to fbc states that they will come get her shortly.

## 2023-02-12 NOTE — Progress Notes (Signed)
Pt went to the courtyard for fresh air.

## 2023-02-12 NOTE — ED Notes (Signed)
Pt accompanied to [place] from OBS unit. Gait steady.  Pt oriented to milieu and rules.  Signed consents and skin assessment performed.  Pt denies SI, HI or AVH at this time.  She spoke with Dr. Alvie Heidelberg and attended group.   Q 15 min checks initiated.

## 2023-02-12 NOTE — ED Notes (Signed)
Pt is currently sleeping, no distress noted, environmental check complete, will continue to monitor patient for safety. ? ?

## 2023-02-12 NOTE — Progress Notes (Signed)
Pt had lunch and is currently watching TV. No distress noted or concerns voiced. Staff will monitor for pt's safety.

## 2023-02-12 NOTE — ED Notes (Signed)
Patient  sleeping in no acute stress. RR even and unlabored .Environment secured .Will continue to monitor for safely. 

## 2023-02-13 DIAGNOSIS — F329 Major depressive disorder, single episode, unspecified: Secondary | ICD-10-CM | POA: Diagnosis not present

## 2023-02-13 MED ORDER — TRAZODONE HCL 100 MG PO TABS
100.0000 mg | ORAL_TABLET | Freq: Every day | ORAL | Status: DC
  Administered 2023-02-13 – 2023-02-16 (×4): 100 mg via ORAL
  Filled 2023-02-13 (×4): qty 1

## 2023-02-13 NOTE — ED Notes (Signed)
Pt is currently sleeping, no distress noted, environmental check complete, will continue to monitor patient for safety. ? ?

## 2023-02-13 NOTE — ED Notes (Signed)
Pt sleeping in bed. RR even and unlabored. No noted distress. Will continue to monitor for safety

## 2023-02-13 NOTE — ED Provider Notes (Cosign Needed Addendum)
Behavioral Health Progress Note  Date and Time: 02/13/2023 1:42 PM Name: Stephanie Riley MRN:  [id number]  Subjective:  Stephanie Riley is a 72 yo patient with a PPH of MDD, Etoh use disorder, ADHD, anxiety, and chronic prescribed BZD use. She presented to the ED endorsing withdrawal symptoms and wish to receive medication assisted withdrawal from BZDs. Patient had inpatient hospitalization 12/2022 for Etoh use disorder where there was concern that patient was fixated on titrating off Klonopin but minimized her Etoh intake, which providers felt was more concerning. Per EMR, patient endorsed 1 bottle of wine a night being normal intake. Patient was noted to have withdrawals 2/2 to Etoh (diarrhea, HTN, and nausea). There was also concern that patient was having falls at home, but unsure if poor cognition or being evasive, she would not talk about them.  On assessment today patient is Aox4. Patient is able to spell "world" backwards, completes DOWB, and calculate 11 quarters in $2.75.However, provider does not deem patient to be a reliable story teller as she does appear a bit confused on timelines, and does not ever talk about her psych hospitalization 12/2022 at [facility]. She denies SI, HI, and AVH. She reports that her appetite was good, but she did have some night time awakening until she got PRN Trazodone which helped. She denies SI, HI, and AVH today.  Etoh-  Did address this hospitalization with patient (RN briefly came in to talk with patient as well) and she continues to minimize the hospitalization and her Etoh intake. Patient did admit that she has some trouble recalling things. Objectively patient appeared to become anxious when talking about Etoh use, and was noted to tug at her hair and tense up. Patient did admit that her family attempted an intervention in July, but she did not wish to go into details. Patient endorsed that she does not think her drinking is a problem, when asked by RN 2 times.  Patient endorsed she was most concerned about her Klonopin and BZD reliance.   Patient endorsed that she thinks she really on drinks when she is not using BZDs. Patient really struggled to quantify how much Etoh she is drinking, but was willing to admit that she is at least drinking 1 bottle of wine in a 24 hr period. Patient was not very forthcoming about her drinking but admitted she had some amount of Etoh the day before arrival (02/12/2023).  Patient does not think her family needed to have an intervention last year and endorsed that she likes to drink from smaller glasses as this makes her feel that she is not drinking much, and she believes this.   BZD- Patient reports that in 11/2022 her PCP discontinued her BZDs at 0.'5mg'$  BID. Per PDMP, patient was tapering off Klonopin 0.'5mg'$  QID then to Klonopin 0.'5mg'$  TID in the last 2 years. It is unknown why the taper was abruptly stopped, patient endorses that she has been on either Xanax or Klonopin  since 1986. Patient reports that she went to [facility], for safe titration off of BZDs but reports that she hadn't been on any due to running out in maybe 1 week. Patient reports that around day 3, he endorsed paranoia symptoms. Patient has no insight that it was paranoia, but she thinks that the group of women she liked to hang with were about to "jump" her, she also felt like her roommate had "changed" and was more volatile towards her, despite previously getting along well. Patient reports that she  was so anxious to leave due to this, she requested a taxi but she does not think [facility] every called them. After waiting what she believes were a few hours for a ride, she ended up eloping off the campus, "sliding through the fence." She was IVC'd, found, and taken to [facility]. She reports that she recalls being told she will titrated of BZD's there, and asked Dr. Reece Levy to not put her on Klonopin, so she was placed on Ativan and then sent home with a 15 day supply. Patient reports  that she tried to self titrate with what she was sent home with, but she began to have withdrawal symptoms and her Etoh intake significantly increased to accommodate for this.   Anxiety, depression- Patient endorses depressive mood since the intervention in 06/2202, but she reports that it is because her husband left. She denies anxiety but also reports that if she does not have BZDs or Etoh she gets very anxious. She also reports that she was put on BZDs in 1986 due to anxiety symptoms, feeling overwhelmed by life.   Falls- Patient minimizes and doesn't appear concerned. RN found a bruise on R side of frontal lobe on hairline. When patient was confronted she reports she got up one night for a drink and hit her head on the nightstand. She reports that this was 1 week ago. Her last fall was approx 3 months ago.    Diagnosis:  Final diagnoses:  Alcohol use disorder, moderate, dependence (HCC)  Benzodiazepine withdrawal without complication (HCC)    Total Time spent with patient: 1.5 hours  Past Psychiatric History: OPT: Hx of Adderall, Zoloft, Trazodone, Wellbutrin, Xanax, Klonopin, no psychiatry opt 12/2022- INPT at [facility] , patient noted to have yellow/orange sclera on arrival, but cleared by discharge. Dx with Etoh use d/o,. MOCA was done due to cognitive concerns, scored 23/30. 2nd hosp in 2024- [facility], BZD withdrawal Dx: MDD, Etoh use disorder, ADHD, anxiety, and chronic prescribed BZD use. Therapy: marriage counseling Past Medical History: HTN Family History: Unknown at this time Family Psychiatric  History: Both parents had Alzhmier's Social History:  -Living alone, separated from husband -Daughter in [place] permission for Husband to be contact but not daughter - was a Pharmacist, hospital - enjoyed working out at least 1 year ago  Additional Social History:                         Sleep: Fair  Appetite:  Good  Current Medications:  Current Facility-Administered  Medications  Medication Dose Route Frequency Provider Last Rate Last Admin   acetaminophen (TYLENOL) tablet 650 mg  650 mg Oral Q6H PRN Rosezetta Schlatter, MD       alum & mag hydroxide-simeth (MAALOX/MYLANTA) 200-200-20 MG/5ML suspension 30 mL  30 mL Oral Q4H PRN Rosezetta Schlatter, MD       amLODipine (NORVASC) tablet 10 mg  10 mg Oral QHS Tharon Aquas, NP   10 mg at 02/12/23 2137   clonazePAM (KLONOPIN) tablet 0.5 mg  0.5 mg Oral BID Rosezetta Schlatter, MD   0.5 mg at 02/13/23 0959   losartan (COZAAR) tablet 100 mg  100 mg Oral Daily Tharon Aquas, NP   100 mg at 02/13/23 [id number]   And   hydrochlorothiazide (HYDRODIURIL) tablet 12.5 mg  12.5 mg Oral Daily Tharon Aquas, NP   12.5 mg at 02/13/23 0958   hydrOXYzine (ATARAX) tablet 25 mg  25 mg Oral Q6H  PRN Rosezetta Schlatter, MD   25 mg at 02/12/23 2137   loperamide (IMODIUM) capsule 2-4 mg  2-4 mg Oral PRN Rosezetta Schlatter, MD   4 mg at 02/13/23 0959   LORazepam (ATIVAN) tablet 1 mg  1 mg Oral Q6H PRN Rosezetta Schlatter, MD   1 mg at 02/12/23 0201   magnesium hydroxide (MILK OF MAGNESIA) suspension 30 mL  30 mL Oral Daily PRN Rosezetta Schlatter, MD       multivitamin with minerals tablet 1 tablet  1 tablet Oral Daily Rosezetta Schlatter, MD   1 tablet at 02/13/23 0958   ondansetron (ZOFRAN-ODT) disintegrating tablet 4 mg  4 mg Oral Q6H PRN Rosezetta Schlatter, MD   4 mg at 02/12/23 1308   sertraline (ZOLOFT) tablet 200 mg  200 mg Oral Daily Rosezetta Schlatter, MD   200 mg at 02/13/23 [id number]   thiamine (VITAMIN B1) tablet 100 mg  100 mg Oral Daily Rosezetta Schlatter, MD   100 mg at 02/13/23 0959   traZODone (DESYREL) tablet 100 mg  100 mg Oral QHS Freida Busman, MD       Current Outpatient Medications  Medication Sig Dispense Refill   amLODipine (NORVASC) 10 MG tablet Take 10 mg by mouth daily.     amphetamine-dextroamphetamine (ADDERALL) 20 MG tablet Take 20 mg by mouth 2 (two) times daily.     buPROPion (WELLBUTRIN XL) 150 MG 24 hr tablet Take 150 mg by  mouth every morning.     losartan-hydrochlorothiazide (HYZAAR) 100-12.5 MG tablet Take 1 tablet by mouth daily.     sertraline (ZOLOFT) 100 MG tablet Take 200 mg by mouth daily.     traZODone (DESYREL) 100 MG tablet Take 100 mg by mouth at bedtime.      Labs  Lab Results:  Admission on 02/11/2023  Component Date Value Ref Range Status   Alcohol, Ethyl (B) 02/11/2023 72 (H)  <10 mg/dL Final   Comment: (NOTE) Lowest detectable limit for serum alcohol is 10 mg/dL.  For medical purposes only. Performed at [facility], [place] [address], [place], [place] [address]    Sodium 02/11/2023 136  135 - 145 mmol/L Final   Potassium 02/11/2023 3.2 (L)  3.5 - 5.1 mmol/L Final   Chloride 02/11/2023 97 (L)  98 - 111 mmol/L Final   CO2 02/11/2023 24  22 - 32 mmol/L Final   Glucose, Bld 02/11/2023 97  70 - 99 mg/dL Final   Glucose reference range applies only to samples taken after fasting for at least 8 hours.   BUN 02/11/2023 7 (L)  8 - 23 mg/dL Final   Creatinine, Ser 02/11/2023 0.47  0.44 - 1.00 mg/dL Final   Calcium 02/11/2023 9.2  8.9 - 10.3 mg/dL Final   Total Protein 02/11/2023 7.7  6.5 - 8.1 g/dL Final   Albumin 02/11/2023 4.7  3.5 - 5.0 g/dL Final   AST 02/11/2023 35  15 - 41 U/L Final   ALT 02/11/2023 34  0 - 44 U/L Final   Alkaline Phosphatase 02/11/2023 91  38 - 126 U/L Final   Total Bilirubin 02/11/2023 1.7 (H)  0.3 - 1.2 mg/dL Final   GFR, Estimated 02/11/2023 >60  >60 mL/min Final   Comment: (NOTE) Calculated using the CKD-EPI Creatinine Equation (2021)    Anion gap 02/11/2023 15  5 - 15 Final   Performed at [facility] [address] [place], [place] [address]   WBC 02/11/2023 9.2  4.0 - 10.5 K/uL  Final   RBC 02/11/2023 4.23  3.87 - 5.11 MIL/uL Final   Hemoglobin 02/11/2023 13.4  12.0 - 15.0 g/dL Final   HCT 02/11/2023 38.8  36.0 - 46.0 % Final   MCV 02/11/2023 91.7  80.0 - 100.0 fL Final   MCH 02/11/2023 31.7  26.0 - 34.0 pg Final   MCHC 02/11/2023 34.5  30.0  - 36.0 g/dL Final   RDW 02/11/2023 13.0  11.5 - 15.5 % Final   Platelets 02/11/2023 422 (H)  150 - 400 K/uL Final   nRBC 02/11/2023 0.0  0.0 - 0.2 % Final   Performed at [facility], [place] [address] [place], [place] [address]   Magnesium 02/11/2023 2.0  1.7 - 2.4 mg/dL Final   Performed at [facility] [address] [place], [place] [address]   Phosphorus 02/11/2023 3.7  2.5 - 4.6 mg/dL Final   Performed at [facility] [address], [place], [place] [address]   Vitamin B-12 02/11/2023 252  180 - 914 pg/mL Final   Comment: (NOTE) This assay is not validated for testing neonatal or myeloproliferative syndrome specimens for Vitamin B12 levels. Performed at [facility], [place] [address] [place], [place] [address]    Folate 02/11/2023 6.3  >5.9 ng/mL Final   Performed at [facility] [address], [place], [place] [address]   TSH 02/11/2023 3.115  0.350 - 4.500 uIU/mL Final   Comment: Performed by a 3rd Generation assay with a functional sensitivity of <=0.01 uIU/mL. Performed at [facility], [place] [address], [place], [place] [address]    SARS Coronavirus 2 by RT PCR 02/11/2023 NEGATIVE  NEGATIVE Final   Influenza A by PCR 02/11/2023 NEGATIVE  NEGATIVE Final   Influenza B by PCR 02/11/2023 NEGATIVE  NEGATIVE Final   Comment: (NOTE) The Xpert Xpress SARS-CoV-2/FLU/RSV plus assay is intended as an aid in the diagnosis of influenza from Nasopharyngeal swab specimens and should not be used as a sole basis for treatment. Nasal washings and aspirates are unacceptable for Xpert Xpress SARS-CoV-2/FLU/RSV testing.  Fact Sheet for Patients: EntrepreneurPulse.com.au  Fact Sheet for Healthcare Providers: IncredibleEmployment.be  This test is not yet approved or cleared by the [place] FDA and has been authorized for detection and/or diagnosis of SARS-CoV-2 by FDA under an Emergency Use Authorization (EUA).  This EUA will remain in effect (meaning this test can be used) for the duration of the COVID-19 declaration under Section 564(b)(1) of the Act, 21 U.S.C. section 360bbb-3(b)(1), unless the authorization is terminated or revoked.     Resp Syncytial Virus by PCR 02/11/2023 NEGATIVE  NEGATIVE Final   Comment: (NOTE) Fact Sheet for Patients: EntrepreneurPulse.com.au  Fact Sheet for Healthcare Providers: IncredibleEmployment.be  This test is not yet approved or cleared by the [place] FDA and has been authorized for detection and/or diagnosis of SARS-CoV-2 by FDA under an Emergency Use Authorization (EUA). This EUA will remain in effect (meaning this test can be used) for the duration of the COVID-19 declaration under Section 564(b)(1) of the Act, 21 U.S.C. section 360bbb-3(b)(1), unless the authorization is terminated or revoked.  Performed at [facility], [place] [address] [place], [place] [address]   Admission on 11/06/2022, Discharged on 11/06/2022  Component Date Value Ref Range Status   WBC 11/06/2022 7.0  4.0 - 10.5 K/uL Final   RBC 11/06/2022 4.23  3.87 - 5.11 MIL/uL Final   Hemoglobin 11/06/2022 13.9  12.0 -  15.0 g/dL Final   HCT 11/06/2022 39.7  36.0 - 46.0 % Final   MCV 11/06/2022 93.9  80.0 - 100.0 fL Final   MCH 11/06/2022 32.9  26.0 - 34.0 pg Final   MCHC 11/06/2022 35.0  30.0 - 36.0 g/dL Final   RDW 11/06/2022 12.6  11.5 - 15.5 % Final   Platelets 11/06/2022 314  150 - 400 K/uL Final   nRBC 11/06/2022 0.0  0.0 - 0.2 % Final   Neutrophils Relative % 11/06/2022 64  % Final   Neutro Abs 11/06/2022 4.5  1.7 - 7.7 K/uL Final   Lymphocytes Relative 11/06/2022 25  % Final   Lymphs Abs 11/06/2022 1.8  0.7 - 4.0 K/uL Final   Monocytes Relative 11/06/2022 7  % Final   Monocytes Absolute 11/06/2022 0.5  0.1 - 1.0 K/uL Final   Eosinophils Relative 11/06/2022 1  % Final   Eosinophils Absolute 11/06/2022 0.1  0.0 - 0.5 K/uL Final    Basophils Relative 11/06/2022 2  % Final   Basophils Absolute 11/06/2022 0.1  0.0 - 0.1 K/uL Final   Immature Granulocytes 11/06/2022 1  % Final   Abs Immature Granulocytes 11/06/2022 0.04  0.00 - 0.07 K/uL Final   Performed at [facility], [place] [address] [place], [place] [address]   Sodium 11/06/2022 137  135 - 145 mmol/L Final   Potassium 11/06/2022 3.0 (L)  3.5 - 5.1 mmol/L Final   Chloride 11/06/2022 100  98 - 111 mmol/L Final   CO2 11/06/2022 21 (L)  22 - 32 mmol/L Final   Glucose, Bld 11/06/2022 117 (H)  70 - 99 mg/dL Final   Glucose reference range applies only to samples taken after fasting for at least 8 hours.   BUN 11/06/2022 13  8 - 23 mg/dL Final   Creatinine, Ser 11/06/2022 0.48  0.44 - 1.00 mg/dL Final   Calcium 11/06/2022 9.4  8.9 - 10.3 mg/dL Final   Total Protein 11/06/2022 7.6  6.5 - 8.1 g/dL Final   Albumin 11/06/2022 4.3  3.5 - 5.0 g/dL Final   AST 11/06/2022 45 (H)  15 - 41 U/L Final   ALT 11/06/2022 38  0 - 44 U/L Final   Alkaline Phosphatase 11/06/2022 74  38 - 126 U/L Final   Total Bilirubin 11/06/2022 1.6 (H)  0.3 - 1.2 mg/dL Final   GFR, Estimated 11/06/2022 >60  >60 mL/min Final   Comment: (NOTE) Calculated using the CKD-EPI Creatinine Equation (2021)    Anion gap 11/06/2022 16 (H)  5 - 15 Final   Performed at [facility], [place] [address], [place], [place] [address]   Alcohol, Ethyl (B) 11/06/2022 54 (H)  <10 mg/dL Final   Comment: (NOTE) Lowest detectable limit for serum alcohol is 10 mg/dL.  For medical purposes only. Performed at [facility], [place] [address], [place], [place] [address]     Blood Alcohol level:  Lab Results  Component Value Date   ETH 72 (H) 02/11/2023   ETH 54 (H) [id number]    Metabolic Disorder Labs: No results found for: "HGBA1C", "MPG" No results found for: "PROLACTIN" No results found for: "CHOL", "TRIG", "HDL", "CHOLHDL", "VLDL", "LDLCALC"  Therapeutic  Lab Levels: No results found for: "LITHIUM" No results found for: "VALPROATE" No results found for: "CBMZ"  Physical Findings   PHQ2-9    Flowsheet Row ED from 02/11/2023 in [facility]  PHQ-2 Total Score 6  PHQ-9 Total Score 9  [place] ED from 02/11/2023 in [facility] ED from [facility]/06/2022 in [facility] Emergency Department at [place] No Risk No Risk        Musculoskeletal  Strength & Muscle Tone: decreased Gait & Station: unsteady Patient leans: N/A  Psychiatric Specialty Exam  Presentation  General Appearance:  Appropriate for Environment  Eye Contact: Good  Speech: Clear and Coherent; Normal Rate  Speech Volume: Normal  Handedness: Right   Mood and Affect  Mood: Anxious  Affect: Congruent   Thought Process  Thought Processes: Coherent; Linear; Goal Directed  Descriptions of Associations:Intact  Orientation:Full (Time, Place and Person)  Thought Content:Logical; WDL     Hallucinations:Hallucinations: None  Ideas of Reference:None  Suicidal Thoughts:Suicidal Thoughts: No  Homicidal Thoughts:Homicidal Thoughts: No   Sensorium  Memory: Immediate Fair; Recent Fair; Remote Fair  Judgment: Poor  Insight: Poor   Executive Functions  Concentration: Fair  Attention Span: Fair  Recall: [place] of Knowledge: Fair  Language: Fair   Psychomotor Activity  Psychomotor Activity: Psychomotor Activity: Normal   Assets  Assets: Communication Skills; Desire for Improvement   Sleep  Sleep: Sleep: Good   Nutritional Assessment (For OBS and FBC admissions only) Has the patient had a weight loss or gain of 10 pounds or more in the last 3 months?: No Has the patient had a decrease in food intake/or appetite?: No Does the patient have dental problems?: No Does the patient have eating habits or behaviors that may be  indicators of an eating disorder including binging or inducing vomiting?: No Has the patient recently lost weight without trying?: 0 Has the patient been eating poorly because of a decreased appetite?: 0 Malnutrition Screening Tool Score: 0    Physical Exam  Physical Exam HENT:     Head: Normocephalic.  Pulmonary:     Effort: Pulmonary effort is normal.  Neurological:     Mental Status: She is alert and oriented to person, place, and time.    Review of Systems  Psychiatric/Behavioral:  Negative for hallucinations and suicidal ideas. The patient is not nervous/anxious.    Blood pressure 122/73, pulse 85, temperature 98.1 F (36.7 C), resp. rate 16, SpO2 96 %. There is no height or weight on file to calculate BMI.  Treatment Plan Summary: Daily contact with patient to assess and evaluate symptoms and progress in treatment and Medication management  Patient appears to have VERY poor insight into her diagnosis and problems. She is fixated on her BZD use disorder, but success in coming off BZDs will likely being decreased due to Etoh use. Patient has a lot of risk factors including living alone, falls that she may not be reporting, minimizing Etoh intake, and cognitive impairment. Unsure if patient is/ has been experiencing BZD withdrawal delirium or if her presentation (poor memory) is her baseline with a recent [place] of 23/30 this is possible. Regardless due to patient's long hx of BZD use and Etoh abuse, she needs a slow, taper over months on Klonopin. Will continue Klonopin and monitor for signs of withdrawal or improvement in cognition. Will not restart Wellbutrin due to seizure precautions.   Etoh use disorder, severe Complicated BZD withdrawal Chronic BZD use Mild cognitive impairment - CIWA w/ PRN Ativan - Continue Klonopin 0.'5mg'$  BID - thiamine '100mg'$  daily PRN -Tylenol '650mg'$  q6h, pain -Maalox 50m q4h, indigestion -Atarax '25mg'$  TID, anxiety -Milk of Mag 325m constipation -  Zofran '4mg'$  q6h , nausea -loperamide 2-'4mg'$ ,  diarrhea  Hx MDD Hx GAD - Continue Zoloft '200mg'$  daily - Trazodone '100mg'$  QHS      PGY-3 Freida Busman, MD 02/13/2023 1:42 PM

## 2023-02-13 NOTE — ED Notes (Signed)
Pt awake aat nurses station requesting sleep med. Medicated with PRN med.Returned to room. Will continue to monitor for safety

## 2023-02-13 NOTE — Group Note (Signed)
Group Topic: Recovery Basics  Group Date: 02/13/2023 Start Time: 0900 End Time: 1000 Facilitators: Parks Neptune, NT  Department: [facility]  Number of Participants: 8  Group Focus: daily focus and substance abuse education Treatment Modality:  Psychoeducation Interventions utilized were clarification, group exercise, and support Purpose: enhance coping skills and increase insight  Name: Stephanie Riley Date of Birth: 05-11-1951  MR: [id number]    Level of Participation: moderate Quality of Participation: quiet Interactions with others: positive Mood/Affect: positive Triggers (if applicable): n/a Cognition: coherent/clear Progress: Moderate Response: n/a Plan: follow-up needed  Patients Problems:  Patient Active Problem List   Diagnosis Date Noted   Alcohol use disorder, moderate, dependence ([place]) 02/12/2023   Benzodiazepine withdrawal without complication ([place]) [id number]

## 2023-02-13 NOTE — ED Notes (Signed)
Patient is currently sitting in the dining room watching television, no distress noted, will continue to monitor patient for safety. 

## 2023-02-13 NOTE — ED Notes (Signed)
Patient was disheveled and somewhat confused this morning.  She ate breakfast and spoke with MD.  Patient was under reporting her etoh use and appears to be in significant denial about substance use.  Patient [place] in thoughts around etoh and BZD use.  Patient was assisted by Probation officer and [facility] with shower and dressing.  During shower process a bruise was identified at her right temple.  MD aware and she visually assessed it.  Patient states she hit is on the edge of her end table.  Patient ate lunch and has been pleasant on approach.  Patient seems less confused as the day progresses.  She has eaten well and has attended all groups offered and has participated well.  Will monitor and provide a safe supportive environment for her.  Will assist with ADL's as needed.  No evidence of withdrawal or delirium.

## 2023-02-14 DIAGNOSIS — F329 Major depressive disorder, single episode, unspecified: Secondary | ICD-10-CM | POA: Diagnosis not present

## 2023-02-14 DIAGNOSIS — F1020 Alcohol dependence, uncomplicated: Secondary | ICD-10-CM | POA: Diagnosis not present

## 2023-02-14 LAB — POTASSIUM: Potassium: 4.2 mmol/L (ref 3.5–5.1)

## 2023-02-14 MED ORDER — HYDROXYZINE HCL 25 MG PO TABS
25.0000 mg | ORAL_TABLET | Freq: Three times a day (TID) | ORAL | Status: DC | PRN
  Administered 2023-02-14 – 2023-02-16 (×3): 25 mg via ORAL
  Filled 2023-02-14 (×3): qty 1

## 2023-02-14 NOTE — Group Note (Signed)
Group Topic: Overcoming Obstacles  Group Date: 02/14/2023 Start Time: 0800 End Time: 0900 Facilitators: Mervyn Skeeters D, NT  Department: [facility]  Number of Participants: 6  Group Focus: relaxation Treatment Modality:  Psychoeducation Interventions utilized were assignment Purpose: increase insight  Name: Stephanie Riley Date of Birth: [birth year]  MR: [id number]    Level of Participation: moderate Quality of Participation: cooperative Interactions with others: gave feedback Mood/Affect: appropriate Triggers (if applicable): n/a Cognition: coherent/clear Progress: Moderate Response: n/a Plan: follow-up needed  Patients Problems:  Patient Active Problem List   Diagnosis Date Noted   Alcohol use disorder, moderate, dependence ([place]) 02/12/2023   Benzodiazepine withdrawal without complication ([place]) [id number]

## 2023-02-14 NOTE — ED Notes (Signed)
Outside with nurse

## 2023-02-14 NOTE — ED Provider Notes (Signed)
Behavioral Health Progress Note  Date and Time: 02/14/2023 2:15 PM Name: Stephanie Riley MRN:  [id number]  Subjective:  Stephanie Riley is a 72 yo patient with a PPH of MDD, Etoh use disorder, ADHD, anxiety, and chronic prescribed BZD use. She presented to the ED endorsing withdrawal symptoms and wish to receive medication assisted withdrawal from BZDs. Patient had inpatient hospitalization 12/2022 for Etoh use disorder where there was concern that patient was fixated on titrating off Klonopin but minimized her Etoh intake, which providers felt was more concerning. Per EMR, patient endorsed 1 bottle of wine a night being normal intake. Patient was noted to have withdrawals 2/2 to Etoh (diarrhea, HTN, and nausea). There was also concern that patient was having falls at home, but unsure if poor cognition or being evasive, she would not talk about them.   On assessment today, patient appeared to have more ease with conversation and more coherent, linear thought process.  Patient endorsed that she slept and is eating well. Patient denies SI, HI, and AVH. Patient and provider discussed patient's BZD and Etoh use d/o. During discussion, patient did admit that she had been struggling with depression especially after her husband left in 06/2022. She also admitted that her drinking increased BEFORE he left, due to issues with communication and feeling verbally abused. She endorsed that she would drink more in the night time when he was still at home and after he went to bed. She appeared to make the connection that her increase Etoh intake was in response to how she felt in the relationship. Provider and patient discussed that patient must accept that both her Etoh intake and Klonopin reliance are a problem and she should not ingest either. Today patient endorsed acceptance at this idea, but she still see's BZD as the main reason for Etoh being a problem. She was a bit reluctant to admit that Etoh was a problem as well  as admit that she has anxiety and that this is likely contributing to reliance on both substances. Patient also endorsed some insight that she struggles with her identity and constantly see's herself as a Pharmacist, hospital. Patient endorsed that each time she is in a group session or therapy, she see's herself as a Pharmacist, hospital rather than a Landscape architect. Patient struggled to identify what she got out of provider's group yesterday.   Assignment:  3 things she can learn this year. 2 things she has learned in the facility.   Diagnosis:  Final diagnoses:  Alcohol use disorder, moderate, dependence (HCC)  Benzodiazepine withdrawal without complication (HCC)    Total Time spent with patient: 45 minutes Past Psychiatric History: OPT: Hx of Adderall, Zoloft, Trazodone, Wellbutrin, Xanax, Klonopin, no psychiatry opt 12/2022- INPT at [facility] , patient noted to have yellow/orange sclera on arrival, but cleared by discharge. Dx with Etoh use d/o,. MOCA was done due to cognitive concerns, scored 23/30. 2nd hosp in 2024- [place], BZD withdrawal Dx: MDD, Etoh use disorder, ADHD, anxiety, and chronic prescribed BZD use. Therapy: marriage counseling Past Medical History: HTN Family History: Unknown at this time Family Psychiatric  History: Both parents had Alzhmier's Social History:  -Living alone, separated from husband -Daughter in [place] permission for Husband to be contact but not daughter - was a Pharmacist, hospital - enjoyed working out at least 1 year ago  Additional Social History:  Sleep: Good  Appetite:  Good  Current Medications:  Current Facility-Administered Medications  Medication Dose Route Frequency Provider Last Rate Last Admin   acetaminophen (TYLENOL) tablet 650 mg  650 mg Oral Q6H PRN Rosezetta Schlatter, MD       alum & mag hydroxide-simeth (MAALOX/MYLANTA) 200-200-20 MG/5ML suspension 30 mL  30 mL Oral Q4H PRN Rosezetta Schlatter, MD   30 mL at 02/13/23 1856    amLODipine (NORVASC) tablet 10 mg  10 mg Oral QHS Tharon Aquas, NP   10 mg at 02/13/23 2100   clonazePAM (KLONOPIN) tablet 0.5 mg  0.5 mg Oral BID Rosezetta Schlatter, MD   0.5 mg at 02/14/23 0933   losartan (COZAAR) tablet 100 mg  100 mg Oral Daily Tharon Aquas, NP   100 mg at 02/14/23 [id number]   And   hydrochlorothiazide (HYDRODIURIL) tablet 12.5 mg  12.5 mg Oral Daily Tharon Aquas, NP   12.5 mg at 02/14/23 [id number]   hydrOXYzine (ATARAX) tablet 25 mg  25 mg Oral Q6H PRN Rosezetta Schlatter, MD   25 mg at 02/12/23 2137   loperamide (IMODIUM) capsule 2-4 mg  2-4 mg Oral PRN Rosezetta Schlatter, MD   4 mg at 02/13/23 0959   LORazepam (ATIVAN) tablet 1 mg  1 mg Oral Q6H PRN Rosezetta Schlatter, MD   1 mg at 02/12/23 0201   magnesium hydroxide (MILK OF MAGNESIA) suspension 30 mL  30 mL Oral Daily PRN Rosezetta Schlatter, MD       multivitamin with minerals tablet 1 tablet  1 tablet Oral Daily Rosezetta Schlatter, MD   1 tablet at 02/14/23 0933   ondansetron (ZOFRAN-ODT) disintegrating tablet 4 mg  4 mg Oral Q6H PRN Rosezetta Schlatter, MD   4 mg at 02/12/23 1308   sertraline (ZOLOFT) tablet 200 mg  200 mg Oral Daily Rosezetta Schlatter, MD   200 mg at 02/14/23 [id number]   thiamine (VITAMIN B1) tablet 100 mg  100 mg Oral Daily Rosezetta Schlatter, MD   100 mg at 02/14/23 0933   traZODone (DESYREL) tablet 100 mg  100 mg Oral QHS Damita Dunnings B, MD   100 mg at 02/13/23 2100   Current Outpatient Medications  Medication Sig Dispense Refill   amLODipine (NORVASC) 10 MG tablet Take 10 mg by mouth daily.     amphetamine-dextroamphetamine (ADDERALL) 20 MG tablet Take 20 mg by mouth 2 (two) times daily.     buPROPion (WELLBUTRIN XL) 150 MG 24 hr tablet Take 150 mg by mouth every morning.     losartan-hydrochlorothiazide (HYZAAR) 100-12.5 MG tablet Take 1 tablet by mouth daily.     sertraline (ZOLOFT) 100 MG tablet Take 200 mg by mouth daily.     traZODone (DESYREL) 100 MG tablet Take 100 mg by mouth at bedtime.      Labs  Lab  Results:  Admission on 02/11/2023  Component Date Value Ref Range Status   Alcohol, Ethyl (B) 02/11/2023 72 (H)  <10 mg/dL Final   Comment: (NOTE) Lowest detectable limit for serum alcohol is 10 mg/dL.  For medical purposes only. Performed at [facility], [place] [address] [place], [place] [address]    Sodium 02/11/2023 136  135 - 145 mmol/L Final   Potassium 02/11/2023 3.2 (L)  3.5 - 5.1 mmol/L Final   Chloride 02/11/2023 97 (L)  98 - 111 mmol/L Final   CO2 02/11/2023 24  22 - 32 mmol/L Final   Glucose, Bld 02/11/2023 97  70 - 99 mg/dL  Final   Glucose reference range applies only to samples taken after fasting for at least 8 hours.   Riley 02/11/2023 7 (L)  8 - 23 mg/dL Final   Creatinine, Ser 02/11/2023 0.47  0.44 - 1.00 mg/dL Final   Calcium 02/11/2023 9.2  8.9 - 10.3 mg/dL Final   Total Protein 02/11/2023 7.7  6.5 - 8.1 g/dL Final   Albumin 02/11/2023 4.7  3.5 - 5.0 g/dL Final   AST 02/11/2023 35  15 - 41 U/L Final   ALT 02/11/2023 34  0 - 44 U/L Final   Alkaline Phosphatase 02/11/2023 91  38 - 126 U/L Final   Total Bilirubin 02/11/2023 1.7 (H)  0.3 - 1.2 mg/dL Final   GFR, Estimated 02/11/2023 >60  >60 mL/min Final   Comment: (NOTE) Calculated using the CKD-EPI Creatinine Equation (2021)    Anion gap 02/11/2023 15  5 - 15 Final   Performed at [facility] [address], [place], [place] [address]   WBC 02/11/2023 9.2  4.0 - 10.5 K/uL Final   RBC 02/11/2023 4.23  3.87 - 5.11 MIL/uL Final   Hemoglobin 02/11/2023 13.4  12.0 - 15.0 g/dL Final   HCT 02/11/2023 38.8  36.0 - 46.0 % Final   MCV 02/11/2023 91.7  80.0 - 100.0 fL Final   MCH 02/11/2023 31.7  26.0 - 34.0 pg Final   MCHC 02/11/2023 34.5  30.0 - 36.0 g/dL Final   RDW 02/11/2023 13.0  11.5 - 15.5 % Final   Platelets 02/11/2023 422 (H)  150 - 400 K/uL Final   nRBC 02/11/2023 0.0  0.0 - 0.2 % Final   Performed at [facility] [address] [place], [place] [address]   Magnesium 02/11/2023 2.0  1.7 -  2.4 mg/dL Final   Performed at [facility] [address], [place], [place] [address]   Phosphorus 02/11/2023 3.7  2.5 - 4.6 mg/dL Final   Performed at [facility] [address], [place], [place] [address]   Vitamin B-12 02/11/2023 252  180 - 914 pg/mL Final   Comment: (NOTE) This assay is not validated for testing neonatal or myeloproliferative syndrome specimens for Vitamin B12 levels. Performed at [facility], [place] [address], [place], [place] [address]    Folate 02/11/2023 6.3  >5.9 ng/mL Final   Performed at [facility] [address] [place], [place] [address]   TSH 02/11/2023 3.115  0.350 - 4.500 uIU/mL Final   Comment: Performed by a 3rd Generation assay with a functional sensitivity of <=0.01 uIU/mL. Performed at [facility], [place] [address] [place] [address], [place], [place] [address]    SARS Coronavirus 2 by RT PCR 02/11/2023 NEGATIVE  NEGATIVE Final   Influenza A by PCR 02/11/2023 NEGATIVE  NEGATIVE Final   Influenza B by PCR 02/11/2023 NEGATIVE  NEGATIVE Final   Comment: (NOTE) The Xpert Xpress SARS-CoV-2/FLU/RSV plus assay is intended as an aid in the diagnosis of influenza from Nasopharyngeal swab specimens and should not be used as a sole basis for treatment. Nasal washings and aspirates are unacceptable for Xpert Xpress SARS-CoV-2/FLU/RSV testing.  Fact Sheet for Patients: EntrepreneurPulse.com.au  Fact Sheet for Healthcare Providers: IncredibleEmployment.be  This test is not yet approved or cleared by the [place] FDA and has been authorized for detection and/or diagnosis of SARS-CoV-2 by FDA under an Emergency Use Authorization (EUA). This EUA will remain in effect (meaning this test can be used) for  the duration of the COVID-19 declaration under Section 564(b)(1) of the Act, 21 U.S.C. section 360bbb-3(b)(1), unless the authorization is terminated or revoked.     Resp Syncytial Virus by PCR  02/11/2023 NEGATIVE  NEGATIVE Final   Comment: (NOTE) Fact Sheet for Patients: EntrepreneurPulse.com.au  Fact Sheet for Healthcare Providers: IncredibleEmployment.be  This test is not yet approved or cleared by the [place] FDA and has been authorized for detection and/or diagnosis of SARS-CoV-2 by FDA under an Emergency Use Authorization (EUA). This EUA will remain in effect (meaning this test can be used) for the duration of the COVID-19 declaration under Section 564(b)(1) of the Act, 21 U.S.C. section 360bbb-3(b)(1), unless the authorization is terminated or revoked.  Performed at [facility], [place] [address], [place], [place] [address]    Potassium 02/14/2023 4.2  3.5 - 5.1 mmol/L Final   Performed at [facility], [place] [address], [place], [place] [address]  Admission on 11/06/2022, Discharged on 11/06/2022  Component Date Value Ref Range Status   WBC 11/06/2022 7.0  4.0 - 10.5 K/uL Final   RBC 11/06/2022 4.23  3.87 - 5.11 MIL/uL Final   Hemoglobin 11/06/2022 13.9  12.0 - 15.0 g/dL Final   HCT 11/06/2022 39.7  36.0 - 46.0 % Final   MCV 11/06/2022 93.9  80.0 - 100.0 fL Final   MCH 11/06/2022 32.9  26.0 - 34.0 pg Final   MCHC 11/06/2022 35.0  30.0 - 36.0 g/dL Final   RDW 11/06/2022 12.6  11.5 - 15.5 % Final   Platelets 11/06/2022 314  150 - 400 K/uL Final   nRBC 11/06/2022 0.0  0.0 - 0.2 % Final   Neutrophils Relative % 11/06/2022 64  % Final   Neutro Abs 11/06/2022 4.5  1.7 - 7.7 K/uL Final   Lymphocytes Relative 11/06/2022 25  % Final   Lymphs Abs 11/06/2022 1.8  0.7 - 4.0 K/uL Final   Monocytes Relative 11/06/2022 7  % Final   Monocytes Absolute 11/06/2022 0.5  0.1 - 1.0 K/uL Final   Eosinophils Relative 11/06/2022 1  % Final   Eosinophils Absolute 11/06/2022 0.1  0.0 - 0.5 K/uL Final   Basophils Relative 11/06/2022 2  % Final   Basophils Absolute 11/06/2022 0.1  0.0 - 0.1 K/uL Final   Immature Granulocytes 11/06/2022  1  % Final   Abs Immature Granulocytes 11/06/2022 0.04  0.00 - 0.07 K/uL Final   Performed at [facility], [place] [address], [place], [place] [address]   Sodium 11/06/2022 137  135 - 145 mmol/L Final   Potassium 11/06/2022 3.0 (L)  3.5 - 5.1 mmol/L Final   Chloride 11/06/2022 100  98 - 111 mmol/L Final   CO2 11/06/2022 21 (L)  22 - 32 mmol/L Final   Glucose, Bld 11/06/2022 117 (H)  70 - 99 mg/dL Final   Glucose reference range applies only to samples taken after fasting for at least 8 hours.   Riley 11/06/2022 13  8 - 23 mg/dL Final   Creatinine, Ser 11/06/2022 0.48  0.44 - 1.00 mg/dL Final   Calcium 11/06/2022 9.4  8.9 - 10.3 mg/dL Final   Total Protein 11/06/2022 7.6  6.5 - 8.1 g/dL Final   Albumin 11/06/2022 4.3  3.5 - 5.0 g/dL Final   AST 11/06/2022 45 (H)  15 - 41 U/L Final   ALT 11/06/2022 38  0 - 44 U/L Final   Alkaline Phosphatase 11/06/2022 74  38 - 126 U/L Final  Total Bilirubin 11/06/2022 1.6 (H)  0.3 - 1.2 mg/dL Final   GFR, Estimated 11/06/2022 >60  >60 mL/min Final   Comment: (NOTE) Calculated using the CKD-EPI Creatinine Equation (2021)    Anion gap 11/06/2022 16 (H)  5 - 15 Final   Performed at [facility], [place] [address], [place], [place] [address]   Alcohol, Ethyl (B) 11/06/2022 54 (H)  <10 mg/dL Final   Comment: (NOTE) Lowest detectable limit for serum alcohol is 10 mg/dL.  For medical purposes only. Performed at [facility], [place] [address], [place], [place] [address]     Blood Alcohol level:  Lab Results  Component Value Date   ETH 72 (H) 02/11/2023   ETH 54 (H) [id number]    Metabolic Disorder Labs: No results found for: "HGBA1C", "MPG" No results found for: "PROLACTIN" No results found for: "CHOL", "TRIG", "HDL", "CHOLHDL", "VLDL", "[place]"  Therapeutic Lab Levels: No results found for: "LITHIUM" No results found for: "VALPROATE" No results found for: "CBMZ"  Physical Findings   PHQ2-9     [facility] ED from 02/11/2023 in [facility]  PHQ-2 Total Score 4  PHQ-9 Total Score 6      [place] ED from 02/11/2023 in [facility] ED from 11/06/2022 in [facility] Emergency Department at [place] No Risk No Risk        Musculoskeletal  Strength & Muscle Tone: within normal limits Gait & Station: normal Patient leans: N/A  Psychiatric Specialty Exam  Presentation  General Appearance:  Casual (in mental health scurbs, put hair up today to best of abilities)  Eye Contact: Fair  Speech: Clear and Coherent  Speech Volume: Normal  Handedness: Right   Mood and Affect  Mood: Euthymic  Affect: Congruent   Thought Process  Thought Processes: Goal Directed  Descriptions of Associations:Intact  Orientation:Full (Time, Place and Person)  Thought Content:Logical     Hallucinations:Hallucinations: None  Ideas of Reference:None  Suicidal Thoughts:Suicidal Thoughts: No  Homicidal Thoughts:Homicidal Thoughts: No   Sensorium  Memory: Immediate Fair; Recent Fair  Judgment: Impaired  Insight: Lacking Low  Executive Functions  Concentration: Fair  Attention Span: Fair  Recall: Old Agency of Knowledge: Good  Language: Good   Psychomotor Activity  Psychomotor Activity: Psychomotor Activity: Decreased   Assets  Assets: Desire for Improvement; Housing; Resilience   Sleep  Sleep:No data recorded  No data recorded  Physical Exam  Physical Exam HENT:     Head: Normocephalic.     Comments: R temporal ecchymosis is improved, appears smaller in size Pulmonary:     Effort: Pulmonary effort is normal.  Skin:    Comments: L elbow has large ecchymosis, appears yellow, black and blue. Area is slightly swollen  Neurological:     Mental Status: She is alert.    Review of Systems  Psychiatric/Behavioral:  Positive for depression.  Negative for hallucinations and suicidal ideas. The patient does not have insomnia.    Blood pressure 137/76, pulse 76, temperature 98.3 F (36.8 C), temperature source Oral, resp. rate 20, SpO2 99 %. There is no height or weight on file to calculate BMI.  Treatment Plan Summary: Daily contact with patient to assess and evaluate symptoms and progress in treatment and Medication management   Patient is very much struggling. She has not accepted that both Etoh and BZDs are a problem and is barely accepting that BZDs are a problem. She  did endorse having told long time friends that she felt her Klonopin may be a problem, but she seems very confused about what to do with this. Patient is slowly admitting that there is more going on with depression and anxiety, but while she is having some insight it appears that she is blaming others for her problems. It was reiterated to patient several times today and yesterday that she has to focus on herself and not attempt to control/ lecture others or blame others, in order to get better. Physically patient appears stronger and more mentally alert than on admission, however, patient really needs dispo planning before she can be discharged. Patient K+ has significantly increased (4.2) with better intake on the unit again suggesting patient was malnourished on admission.   Overall insight was mildly improved from yesterday. Would consider CDIOP for patient.   Etoh use disorder, severe Complicated BZD withdrawal Chronic BZD use Mild cognitive impairment - CIWA w/ PRN Ativan - Continue Klonopin 0.'5mg'$  BID - thiamine '100mg'$  daily PRN -Tylenol '650mg'$  q6h, pain -Maalox 39m q4h, indigestion -Atarax '25mg'$  TID, anxiety -Milk of Mag 370m constipation - Zofran '4mg'$  q6h , nausea -loperamide 2-'4mg'$ , diarrhea   Hx MDD Hx GAD - Continue Zoloft '200mg'$  daily - Trazodone '100mg'$  QHS     PGY-3 JaFreida BusmanMD 02/14/2023 2:15 PM

## 2023-02-14 NOTE — ED Notes (Signed)
Patient is currently in her room watching television, no distress noted, will continue to monitor patient for safety

## 2023-02-14 NOTE — ED Notes (Signed)
Patient asked for medication to help with her anxiety. The patient's hydroxyzine has been discontinued, will reach out to provider to see if it can be continued.

## 2023-02-14 NOTE — ED Notes (Signed)
Patient is alert and oriented x 4. Patient had her K+ redrawn this morning. Patient ate breakfast and received her medications without difficulty. Patient has been interacting with other patients on the unit and staff. Patient denies SI/HI and AVH. Patient is being monitored for safety.

## 2023-02-14 NOTE — ED Notes (Signed)
Pt is currently sleeping, no distress noted, environmental check complete, will continue to monitor patient for safety. ? ?

## 2023-02-15 DIAGNOSIS — F329 Major depressive disorder, single episode, unspecified: Secondary | ICD-10-CM | POA: Diagnosis not present

## 2023-02-15 NOTE — ED Notes (Signed)
Pt sleeping in no acute distress. RR even and unlabored. Environment secured. Will continue to monitor for safety. 

## 2023-02-15 NOTE — ED Notes (Signed)
Pt in [place]

## 2023-02-15 NOTE — ED Provider Notes (Signed)
Behavioral Health Progress Note  Date and Time: 02/15/2023 10:55 AM Name: Stephanie Riley MRN:  [id number]  HPI: Stephanie Riley 72 year old female who initially presented to the [facility] emergency department on 02/11/2023 endorsing withdrawal symptoms and requesting to receive assistance withdrawing from benzodiazepines.  She was recommended for treatment in the [facility].  Subjective:   [name], 72 y.o., female patient seen face to face by this provider chart reviewed on 02/15/23.  Per chart review patient has a past psychiatric history of MDD, EtOH use disorder, ADHD, anxiety, and chronic prescribed benzodiazepine use.  She has a hx of an inpatient hospitalization on 12/2022 for EtOH use.  On today's evaluation Tyriel Kargbo Stefan is observed in her room sitting on her bed in no acute distress.  She is alert/oriented x 4, cooperative, and attentive.  She has normal speech and behavior.  She is denying any withdrawal symptoms.  States today she is struggling with feeling depressed due to her ex spouse telling her that he was going to start dating again.  She has a depressed affect.  She denies any concerns with appetite or sleep.  She feels more confident that she can return home as long as she has psychiatric services in place and if she is discharged home on Klonopin 0.5 mg twice daily, which she is currently prescribed.  She does express anxiety that she will be returning home alone.  Provided reassurance and support.  Discussed outpatient program such as CD IOP and patient does express some interest but states she does not feel that she will be able to present face-to-face and states that may not be a good fit.  She would prefer individual therapy.  She continues to deny SI/HI/AVH.  She verbally contracts for safety.  She does not appear to be responding to internal/external stimuli.  Discussed discharge planning and patient is requested that she be discharged tomorrow 02/16/2023.  She has scheudled  appointments in place, per social work "the [facility]: Appointment was already scheduled for February 19, 2023 at 3:30pm with MD Feliciana Rossetti for medication management/geriatric care.  [facility]: Appointment was scheduled for March 15, 2023 at 10:00am with Warren Danes".    Diagnosis:  Final diagnoses:  Alcohol use disorder, moderate, dependence (HCC)  Benzodiazepine withdrawal without complication ([place])    Total Time spent with patient: 30 minutes  Past Psychiatric History: as documented in H&P Past Medical History: as documented in H&P Family History: as documented in H&P Family Psychiatric  History: as documented in H&P Social History: as documented in H&P  Additional Social History:       Sleep: Good  Appetite:  Good  Current Medications:  Current Facility-Administered Medications  Medication Dose Route Frequency Provider Last Rate Last Admin   acetaminophen (TYLENOL) tablet 650 mg  650 mg Oral Q6H PRN Rosezetta Schlatter, MD       alum & mag hydroxide-simeth (MAALOX/MYLANTA) 200-200-20 MG/5ML suspension 30 mL  30 mL Oral Q4H PRN Rosezetta Schlatter, MD   30 mL at 02/13/23 1856   amLODipine (NORVASC) tablet 10 mg  10 mg Oral QHS Tharon Aquas, NP   10 mg at 02/14/23 2130   clonazePAM (KLONOPIN) tablet 0.5 mg  0.5 mg Oral BID Rosezetta Schlatter, MD   0.5 mg at 02/15/23 0928   losartan (COZAAR) tablet 100 mg  100 mg Oral Daily Tharon Aquas, NP   100 mg at 02/15/23 [id number]   And   hydrochlorothiazide (HYDRODIURIL)  tablet 12.5 mg  12.5 mg Oral Daily Tharon Aquas, NP   12.5 mg at 02/15/23 [id number]   hydrOXYzine (ATARAX) tablet 25 mg  25 mg Oral TID PRN Ajibola, Ene A, NP   25 mg at 02/14/23 2128   magnesium hydroxide (MILK OF MAGNESIA) suspension 30 mL  30 mL Oral Daily PRN Rosezetta Schlatter, MD       multivitamin with minerals tablet 1 tablet  1 tablet Oral Daily Rosezetta Schlatter, MD   1 tablet at 02/15/23 [id number]   sertraline (ZOLOFT) tablet 200 mg   200 mg Oral Daily Rosezetta Schlatter, MD   200 mg at 02/15/23 [id number]   thiamine (VITAMIN B1) tablet 100 mg  100 mg Oral Daily Rosezetta Schlatter, MD   100 mg at 02/15/23 [id number]   traZODone (DESYREL) tablet 100 mg  100 mg Oral QHS Damita Dunnings B, MD   100 mg at 02/14/23 2132   Current Outpatient Medications  Medication Sig Dispense Refill   amLODipine (NORVASC) 10 MG tablet Take 10 mg by mouth daily.     amphetamine-dextroamphetamine (ADDERALL) 20 MG tablet Take 20 mg by mouth 2 (two) times daily.     buPROPion (WELLBUTRIN XL) 150 MG 24 hr tablet Take 150 mg by mouth every morning.     losartan-hydrochlorothiazide (HYZAAR) 100-12.5 MG tablet Take 1 tablet by mouth daily.     sertraline (ZOLOFT) 100 MG tablet Take 200 mg by mouth daily.     traZODone (DESYREL) 100 MG tablet Take 100 mg by mouth at bedtime.      Labs  Lab Results:  Admission on 02/11/2023  Component Date Value Ref Range Status   Alcohol, Ethyl (B) 02/11/2023 72 (H)  <10 mg/dL Final   Comment: (NOTE) Lowest detectable limit for serum alcohol is 10 mg/dL.  For medical purposes only. Performed at [facility], [place] [address], [place], [place] [address]    Sodium 02/11/2023 136  135 - 145 mmol/L Final   Potassium 02/11/2023 3.2 (L)  3.5 - 5.1 mmol/L Final   Chloride 02/11/2023 97 (L)  98 - 111 mmol/L Final   CO2 02/11/2023 24  22 - 32 mmol/L Final   Glucose, Bld 02/11/2023 97  70 - 99 mg/dL Final   Glucose reference range applies only to samples taken after fasting for at least 8 hours.   BUN 02/11/2023 7 (L)  8 - 23 mg/dL Final   Creatinine, Ser 02/11/2023 0.47  0.44 - 1.00 mg/dL Final   Calcium 02/11/2023 9.2  8.9 - 10.3 mg/dL Final   Total Protein 02/11/2023 7.7  6.5 - 8.1 g/dL Final   Albumin 02/11/2023 4.7  3.5 - 5.0 g/dL Final   AST 02/11/2023 35  15 - 41 U/L Final   ALT 02/11/2023 34  0 - 44 U/L Final   Alkaline Phosphatase 02/11/2023 91  38 - 126 U/L Final   Total Bilirubin 02/11/2023 1.7 (H)  0.3 - 1.2 mg/dL  Final   GFR, Estimated 02/11/2023 >60  >60 mL/min Final   Comment: (NOTE) Calculated using the CKD-EPI Creatinine Equation (2021)    Anion gap 02/11/2023 15  5 - 15 Final   Performed at [facility] [address] [place], [place] [address]   WBC 02/11/2023 9.2  4.0 - 10.5 K/uL Final   RBC 02/11/2023 4.23  3.87 - 5.11 MIL/uL Final   Hemoglobin 02/11/2023 13.4  12.0 - 15.0 g/dL Final   HCT 02/11/2023 38.8  36.0 - 46.0 %  Final   MCV 02/11/2023 91.7  80.0 - 100.0 fL Final   MCH 02/11/2023 31.7  26.0 - 34.0 pg Final   MCHC 02/11/2023 34.5  30.0 - 36.0 g/dL Final   RDW 02/11/2023 13.0  11.5 - 15.5 % Final   Platelets 02/11/2023 422 (H)  150 - 400 K/uL Final   nRBC 02/11/2023 0.0  0.0 - 0.2 % Final   Performed at [facility], [place] [address], [place], [place] [address]   Magnesium 02/11/2023 2.0  1.7 - 2.4 mg/dL Final   Performed at [facility] [address], [place], [place] [address]   Phosphorus 02/11/2023 3.7  2.5 - 4.6 mg/dL Final   Performed at [facility] [address], [place], [place] [address]   Vitamin B-12 02/11/2023 252  180 - 914 pg/mL Final   Comment: (NOTE) This assay is not validated for testing neonatal or myeloproliferative syndrome specimens for Vitamin B12 levels. Performed at [facility], [place] [address] [place], [place] [address]    Folate 02/11/2023 6.3  >5.9 ng/mL Final   Performed at [facility] [address] [place], [place] [address]   TSH 02/11/2023 3.115  0.350 - 4.500 uIU/mL Final   Comment: Performed by a 3rd Generation assay with a functional sensitivity of <=0.01 uIU/mL. Performed at [facility], [place] [address] [place], [place] [address]    SARS Coronavirus 2 by RT PCR 02/11/2023 NEGATIVE  NEGATIVE Final   Influenza A by PCR 02/11/2023 NEGATIVE  NEGATIVE Final   Influenza B by PCR 02/11/2023 NEGATIVE  NEGATIVE Final   Comment: (NOTE) The Xpert Xpress SARS-CoV-2/FLU/RSV plus assay is intended as  an aid in the diagnosis of influenza from Nasopharyngeal swab specimens and should not be used as a sole basis for treatment. Nasal washings and aspirates are unacceptable for Xpert Xpress SARS-CoV-2/FLU/RSV testing.  Fact Sheet for Patients: EntrepreneurPulse.com.au  Fact Sheet for Healthcare Providers: IncredibleEmployment.be  This test is not yet approved or cleared by the [place] FDA and has been authorized for detection and/or diagnosis of SARS-CoV-2 by FDA under an Emergency Use Authorization (EUA). This EUA will remain in effect (meaning this test can be used) for the duration of the COVID-19 declaration under Section 564(b)(1) of the Act, 21 U.S.C. section 360bbb-3(b)(1), unless the authorization is terminated or revoked.     Resp Syncytial Virus by PCR 02/11/2023 NEGATIVE  NEGATIVE Final   Comment: (NOTE) Fact Sheet for Patients: EntrepreneurPulse.com.au  Fact Sheet for Healthcare Providers: IncredibleEmployment.be  This test is not yet approved or cleared by the [place] FDA and has been authorized for detection and/or diagnosis of SARS-CoV-2 by FDA under an Emergency Use Authorization (EUA). This EUA will remain in effect (meaning this test can be used) for the duration of the COVID-19 declaration under Section 564(b)(1) of the Act, 21 U.S.C. section 360bbb-3(b)(1), unless the authorization is terminated or revoked.  Performed at [facility], [place] [address] [place], [place] [address]    Potassium 02/14/2023 4.2  3.5 - 5.1 mmol/L Final   Performed at [facility], [place] [address] [place], [place] [address]  Admission on 11/06/2022, Discharged on 11/06/2022  Component Date Value Ref Range Status   WBC 11/06/2022 7.0  4.0 - 10.5 K/uL Final   RBC 11/06/2022 4.23  3.87 - 5.11 MIL/uL Final   Hemoglobin 11/06/2022 13.9  12.0 - 15.0 g/dL Final   HCT 11/06/2022 39.7  36.0 -  46.0 % Final   MCV 11/06/2022 93.9  80.0 - 100.0 fL Final   MCH 11/06/2022 32.9  26.0 - 34.0 pg Final   MCHC 11/06/2022 35.0  30.0 - 36.0 g/dL Final   RDW 11/06/2022 12.6  11.5 - 15.5 % Final   Platelets 11/06/2022 314  150 - 400 K/uL Final   nRBC 11/06/2022 0.0  0.0 - 0.2 % Final   Neutrophils Relative % 11/06/2022 64  % Final   Neutro Abs 11/06/2022 4.5  1.7 - 7.7 K/uL Final   Lymphocytes Relative 11/06/2022 25  % Final   Lymphs Abs 11/06/2022 1.8  0.7 - 4.0 K/uL Final   Monocytes Relative 11/06/2022 7  % Final   Monocytes Absolute 11/06/2022 0.5  0.1 - 1.0 K/uL Final   Eosinophils Relative 11/06/2022 1  % Final   Eosinophils Absolute 11/06/2022 0.1  0.0 - 0.5 K/uL Final   Basophils Relative 11/06/2022 2  % Final   Basophils Absolute 11/06/2022 0.1  0.0 - 0.1 K/uL Final   Immature Granulocytes 11/06/2022 1  % Final   Abs Immature Granulocytes 11/06/2022 0.04  0.00 - 0.07 K/uL Final   Performed at [facility], [place] [address], [place], [place] [address]   Sodium 11/06/2022 137  135 - 145 mmol/L Final   Potassium 11/06/2022 3.0 (L)  3.5 - 5.1 mmol/L Final   Chloride 11/06/2022 100  98 - 111 mmol/L Final   CO2 11/06/2022 21 (L)  22 - 32 mmol/L Final   Glucose, Bld 11/06/2022 117 (H)  70 - 99 mg/dL Final   Glucose reference range applies only to samples taken after fasting for at least 8 hours.   BUN 11/06/2022 13  8 - 23 mg/dL Final   Creatinine, Ser 11/06/2022 0.48  0.44 - 1.00 mg/dL Final   Calcium 11/06/2022 9.4  8.9 - 10.3 mg/dL Final   Total Protein 11/06/2022 7.6  6.5 - 8.1 g/dL Final   Albumin 11/06/2022 4.3  3.5 - 5.0 g/dL Final   AST 11/06/2022 45 (H)  15 - 41 U/L Final   ALT 11/06/2022 38  0 - 44 U/L Final   Alkaline Phosphatase 11/06/2022 74  38 - 126 U/L Final   Total Bilirubin 11/06/2022 1.6 (H)  0.3 - 1.2 mg/dL Final   GFR, Estimated 11/06/2022 >60  >60 mL/min Final   Comment: (NOTE) Calculated using the CKD-EPI Creatinine Equation (2021)     Anion gap 11/06/2022 16 (H)  5 - 15 Final   Performed at [facility], [place] [address] [place], [place] [address]   Alcohol, Ethyl (B) 11/06/2022 54 (H)  <10 mg/dL Final   Comment: (NOTE) Lowest detectable limit for serum alcohol is 10 mg/dL.  For medical purposes only. Performed at [facility], [place] [address] [place], [place] [address]     Blood Alcohol level:  Lab Results  Component Value Date   ETH 72 (H) 02/11/2023   ETH 54 (H) [id number]    Metabolic Disorder Labs: No results found for: "HGBA1C", "MPG" No results found for: "PROLACTIN" No results found for: "CHOL", "TRIG", "HDL", "CHOLHDL", "VLDL", "LDLCALC"  Therapeutic Lab Levels: No results found for: "LITHIUM" No results found for: "VALPROATE" No results found for: "CBMZ"  Physical Findings   PHQ2-9    Flowsheet Row ED from 02/11/2023 in [facility]  PHQ-2 Total Score 4  PHQ-9 Total Score 6      [facility] ED from  02/11/2023 in [facility] from 11[facility]06/2022 in [facility] at [place] No Risk No Risk        Musculoskeletal  Strength & Muscle Tone: within normal limits Gait & Station: normal Patient leans: N/A  Psychiatric Specialty Exam  Presentation  General Appearance:  Casual  Eye Contact: Good  Speech: Clear and Coherent; Normal Rate  Speech Volume: Normal  Handedness: Right   Mood and Affect  Mood: Depressed  Affect: Congruent   Thought Process  Thought Processes: Coherent  Descriptions of Associations:Intact  Orientation:Full (Time, Place and Person)  Thought Content:Logical     Hallucinations:Hallucinations: None  Ideas of Reference:None  Suicidal Thoughts:Suicidal Thoughts: No  Homicidal Thoughts:Homicidal Thoughts: No   Sensorium  Memory: Immediate Good; Recent Good; Remote  Good  Judgment: Fair  Insight: Fair   Community education officer  Concentration: Good  Attention Span: Good  Recall: Good  Fund of Knowledge: Good  Language: Good   Psychomotor Activity  Psychomotor Activity: Psychomotor Activity: Normal   Assets  Assets: Communication Skills; Desire for Improvement; Financial Resources/Insurance; Physical Health; Resilience; Social Support   Sleep  Sleep: Sleep: Good   No data recorded  Physical Exam  Physical Exam Vitals and nursing note reviewed.  Constitutional:      General: She is not in acute distress.    Appearance: Normal appearance. She is not ill-appearing.  HENT:     Head: Normocephalic.  Eyes:     General:        Right eye: No discharge.        Left eye: No discharge.     Conjunctiva/sclera: Conjunctivae normal.  Cardiovascular:     Rate and Rhythm: Normal rate.  Pulmonary:     Effort: Pulmonary effort is normal. No respiratory distress.  Musculoskeletal:        General: Normal range of motion.     Cervical back: Normal range of motion.  Skin:    Coloration: Skin is not jaundiced or pale.  Neurological:     Mental Status: She is alert and oriented to person, place, and time.  Psychiatric:        Attention and Perception: Attention normal. She does not perceive auditory or visual hallucinations.        Mood and Affect: Affect normal. Mood is depressed.        Speech: Speech normal.        Behavior: Behavior normal. Behavior is cooperative.        Thought Content: Thought content normal.        Cognition and Memory: Cognition normal.        Judgment: Judgment normal.    Review of Systems  Constitutional: Negative.   HENT: Negative.    Eyes: Negative.   Respiratory: Negative.    Cardiovascular: Negative.   Gastrointestinal: Negative.   Musculoskeletal: Negative.   Skin: Negative.   Neurological: Negative.   Psychiatric/Behavioral:  Positive for depression.    Blood pressure 123/71, pulse 95,  temperature 98.7 F (37.1 C), temperature source Oral, resp. rate 18, SpO2 97 %. There is no height or weight on file to calculate BMI.  Treatment Plan Summary:  Disposition: Discharge planning is set for 02/16/2023.  Patient is in agreement.Will continue to have daily contact with patient to assess and evaluate symptoms and progress in treatment and Medication management.   Patient has outpatient appointments in place for medication management and therapy.  Patient is requesting to  be discharged home on Klonopin 0.5 mg twice daily, which she is currently prescribed.   "the [facility] on A[facility]: Appointment was already scheduled for February 19, 2023 at 3:30pm with MD Feliciana Rossetti for medication management/geriatric care.  [facility]: Appointment was scheduled for March 15, 2023 at 10:00am with Warren Danes".    Revonda Humphrey, NP 02/15/2023 10:55 AM

## 2023-02-15 NOTE — ED Notes (Signed)
Pt informed that breakfast is served

## 2023-02-15 NOTE — ED Notes (Signed)
Pt sitting in dayroom eating dinner and interacting with peers. No acute distress noted. No concerns voiced. Informed pt to notify staff with any needs or assistance. Pt verbalized understanding or agreement. Will continue to monitor for safety.

## 2023-02-15 NOTE — ED Notes (Signed)
Pt is in the bed sleeping. Respirations are even and unlabored. No acute distress noted. Will continue to monitor for safety. 

## 2023-02-15 NOTE — ED Notes (Signed)
Patient stated feeling depressed because of her situation with her husband. Pt requested for med. Vistaril given as prescribed.

## 2023-02-15 NOTE — ED Notes (Signed)
round

## 2023-02-15 NOTE — ED Notes (Signed)
Pt is currently sleeping, no distress noted, environmental check complete, will continue to monitor patient for safety. ? ?

## 2023-02-15 NOTE — Discharge Planning (Signed)
LCSW spoke with the patient on this morning regarding disposition plans. Patient reports her plan is to discharge home with outpatient services in place. LCSW explored if patient was interested in CDIOP services at this time and patient declined stating, "I don't want to go to groups constantly speaking about alcohol and substances", "that's not my program". LCSW expressed understanding and explored if patient would be in agreement with LCSW setting aftercare appointments for her for [facility] [place] [facility] [place], and patient agreed. Appointments are as follows:   [facility]: Appointment was already scheduled for February 19, 2023 at 3:30pm with MD Feliciana Rossetti for medication management/geriatric care.  [facility]: Appointment was scheduled for March 15, 2023 at 10:00am with Warren Danes", LCSW for therapy. Information was updated into AVS. No other needs were reported at this time.  MD to be provided update regarding plan. LCSW will continue to follow and provide support while on [facility] unit.   Lucius Conn, LCSW Clinical Social Worker [facility] Ph: [phone]

## 2023-02-15 NOTE — ED Notes (Signed)
Pt is in the bed resting. Respirations are even and unlabored. No acute distress noted. Will continue to monitor for safety  

## 2023-02-15 NOTE — Discharge Instructions (Addendum)
Based on the information you have provided and the presenting issue, outpatient services with therapy and psychiatry have been recommended.  It is imperative that you follow through with treatment recommendations within 5-7 days from the of discharge to mitigate further risk to your safety and mental well-being. A list of referrals has been provided below to get you started.  You are not limited to the list provided.  In case of an urgent crisis, you may contact the Mobile Crisis Unit with [facility] at [phone].  Outpatient Therapy and Psychiatry for Medicare Recipients  [facility] [address] [place], [place], [address] [phone] phone  [facility] [address] [place], [place], [address] [address] phone ([address] [employer] [address] - [place], [address], [place], [place], [employer], [address], [employer] Healthy [place], [place], [address], [place], [place], [place], [employer], T[employer], [employer], [employer], [place])  [facility] [address], [address] [place], [place], [address] [phone] phone  [facility] [address] [place], [place], [address] [phone] phone  [facility] [address] [place], [place], [address] [phone] phone [phone] fax  [facility], [place] [address], [place], [address] [phone] phone  [facility] [address] [place], [place], [address] [phone] phone [phone] fax  [facility] [address] [place], [place], [address] [phone] phone  [facility] [address] [place], [place], [address] [phone] phone  [facility] [address] [place], [place], [address] [phone] phone [phone] fax

## 2023-02-16 DIAGNOSIS — F329 Major depressive disorder, single episode, unspecified: Secondary | ICD-10-CM | POA: Diagnosis not present

## 2023-02-16 MED ORDER — POLYETHYLENE GLYCOL 3350 17 G PO PACK
17.0000 g | PACK | Freq: Once | ORAL | Status: AC
  Administered 2023-02-16: 17 g via ORAL
  Filled 2023-02-16: qty 1

## 2023-02-16 NOTE — ED Provider Notes (Signed)
Behavioral Health Progress Note  Date and Time: 02/16/2023 12:03 PM Name: Stephanie Riley MRN:  [id number]   Subjective:  On assessment 3/12, the patient exhibits a depressed affect.  She reports that she is bothered by a recent statement that her husband made (they are separated).  He said that he wants to start dating other women.  She feels this was hurtful.  She was scheduled to discharge today, but she expresses relief when told that she can stay until tomorrow morning.  She has a follow-up plan in place with a PCP appointment appointment on Friday and therapy on April 8.  She says that her friend Morey Hummingbird and her Bible study is a big support for her.  She has engaged well in group and should be ready for discharge tomorrow morning.  The patient denies auditory/visual hallucinations.  The patient reports good appetite, and sleep. They deny suicidal and homicidal thoughts. The patient denies side effects from their medications.  Review of systems as below. The patient denies experiencing any withdrawal symptoms.   Information obtained on initial evaluation: The patient is a 72 year old female, with a history of alcohol use disorder and chronic benzodiazepine use.  No history of psychiatric admissions or psychiatric care documented in the medical record.  She presented to the [facility] behavioral urgent care reporting excessive alcohol use in the context of being taken off of her benzodiazepines.  On assessment 3/8, the patient exhibits a linear and logical thought process.  She appears confused at times during the interview, but this seems to be a significant improvement compared to her mental status yesterday.  Relevant social history obtained by the patient's report.  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  (Her story at times  is not logical and I have a high suspicion that she has had severe alcohol use disorder for a long period of time).  She reports that she was separated from her husband in July but that he comes over "every day, walks the dog, and we chat".  She reports that they are doing marriage counseling right now.  The patient denies issues with depression recently and says she has no suicidal thoughts now or in the recent past.  She denies experiencing any withdrawal.  Her memory appears impaired, both short-term and long-term.  Regarding the plan, the patient states that she wants to remain on her longtime dose of Klonopin 0.5 mg twice daily.  She states that she then wants to follow-up outpatient with her primary care doctor and perhaps a psychiatrist.  She states that she does not have a problem with alcohol and is not interested in substance use treatment with residential rehab or CD IOP.  She requests to stay on Wellbutrin.  Discussed with her that we cannot prescribe Wellbutrin given her recent alcohol use issues and recent issues with benzodiazepine use, and the concern for seizure as a result.   Corky Sox, MD PGY-2   Diagnosis:  Final diagnoses:  Alcohol use disorder, moderate, dependence ([place])  Benzodiazepine withdrawal without complication ([place])    Total Time spent with patient: 30 minutes  Past Psychiatric History: as documented in H&P Past Medical History: as documented in H&P Family History: as documented in H&P Family Psychiatric  History: as documented in H&P Social History: as documented in H&P  Additional Social History:       Sleep: Good  Appetite:  Good  Current Medications:  Current Facility-Administered Medications  Medication Dose Route Frequency Provider Last Rate Last Admin   acetaminophen (TYLENOL) tablet 650 mg  650 mg Oral Q6H PRN Rosezetta Schlatter, MD       alum & mag hydroxide-simeth (MAALOX/MYLANTA) 200-200-20 MG/5ML suspension 30 mL  30 mL Oral Q4H PRN  Rosezetta Schlatter, MD   30 mL at 02/13/23 1856   amLODipine (NORVASC) tablet 10 mg  10 mg Oral QHS Tharon Aquas, NP   10 mg at 02/15/23 2118   clonazePAM (KLONOPIN) tablet 0.5 mg  0.5 mg Oral BID Rosezetta Schlatter, MD   0.5 mg at 02/16/23 0910   losartan (COZAAR) tablet 100 mg  100 mg Oral Daily Tharon Aquas, NP   100 mg at 02/16/23 [id number]   And   hydrochlorothiazide (HYDRODIURIL) tablet 12.5 mg  12.5 mg Oral Daily Tharon Aquas, NP   12.5 mg at 02/16/23 [id number]   hydrOXYzine (ATARAX) tablet 25 mg  25 mg Oral TID PRN [place], Ene A, NP   25 mg at 02/15/23 2013   magnesium hydroxide (MILK OF MAGNESIA) suspension 30 mL  30 mL Oral Daily PRN Rosezetta Schlatter, MD       multivitamin with minerals tablet 1 tablet  1 tablet Oral Daily Rosezetta Schlatter, MD   1 tablet at 02/16/23 0910   sertraline (ZOLOFT) tablet 200 mg  200 mg Oral Daily Rosezetta Schlatter, MD   200 mg at 02/16/23 0910   thiamine (VITAMIN B1) tablet 100 mg  100 mg Oral Daily Rosezetta Schlatter, MD   100 mg at 02/16/23 0910   traZODone (DESYREL) tablet 100 mg  100 mg Oral QHS Damita Dunnings B, MD   100 mg at 02/15/23 2118   Current Outpatient Medications  Medication Sig Dispense Refill   amLODipine (NORVASC) 10 MG tablet Take 10 mg by mouth daily.     amphetamine-dextroamphetamine (ADDERALL) 20 MG tablet Take 20 mg by mouth 2 (two) times daily.     buPROPion (WELLBUTRIN XL) 150 MG 24 hr tablet Take 150 mg by mouth every morning.     losartan-hydrochlorothiazide (HYZAAR) 100-12.5 MG tablet Take 1 tablet by mouth daily.     sertraline (ZOLOFT) 100 MG tablet Take 200 mg by mouth daily.     traZODone (DESYREL) 100 MG tablet Take 100 mg by mouth at bedtime.      Labs  Lab Results:  Admission on 02/11/2023  Component Date Value Ref Range Status   Alcohol, Ethyl (B) 02/11/2023 72 (H)  <10 mg/dL Final   Comment: (NOTE) Lowest detectable limit for serum alcohol is 10 mg/dL.  For medical purposes only. Performed at [facility], [place] [address], [place], [place] [address]    Sodium 02/11/2023 136  135 - 145 mmol/L Final   Potassium 02/11/2023 3.2 (L)  3.5 - 5.1 mmol/L Final   Chloride 02/11/2023 97 (L)  98 - 111 mmol/L Final   CO2 02/11/2023 24  22 - 32 mmol/L Final   Glucose, Bld 02/11/2023 97  70 - 99 mg/dL Final   Glucose reference range applies only to samples taken after fasting for at least 8 hours.   BUN 02/11/2023 7 (L)  8 - 23 mg/dL Final   Creatinine, Ser 02/11/2023 0.47  0.44 - 1.00 mg/dL Final   Calcium 02/11/2023 9.2  8.9 - 10.3 mg/dL Final   Total Protein 02/11/2023 7.7  6.5 - 8.1 g/dL Final   Albumin 02/11/2023 4.7  3.5 - 5.0 g/dL Final   AST 02/11/2023 35  15 - 41 U/L Final   ALT 02/11/2023 34  0 - 44 U/L Final   Alkaline Phosphatase 02/11/2023 91  38 - 126 U/L Final   Total Bilirubin 02/11/2023 1.7 (H)  0.3 - 1.2 mg/dL Final   GFR, Estimated 02/11/2023 >60  >60 mL/min Final   Comment: (NOTE) Calculated using the CKD-EPI Creatinine Equation (2021)    Anion gap 02/11/2023 15  5 - 15 Final   Performed at [facility], [place] [address], [place], [place] [address]   WBC 02/11/2023 9.2  4.0 - 10.5 K/uL Final   RBC 02/11/2023 4.23  3.87 - 5.11 MIL/uL Final   Hemoglobin 02/11/2023 13.4  12.0 - 15.0 g/dL Final   HCT 02/11/2023 38.8  36.0 - 46.0 % Final   MCV 02/11/2023 91.7  80.0 - 100.0 fL Final   MCH 02/11/2023 31.7  26.0 - 34.0 pg Final   MCHC 02/11/2023 34.5  30.0 - 36.0 g/dL Final   RDW 02/11/2023 13.0  11.5 - 15.5 % Final   Platelets 02/11/2023 422 (H)  150 - 400 K/uL Final   nRBC 02/11/2023 0.0  0.0 - 0.2 % Final   Performed at [facility] [address] [place], [place] [address]   Magnesium 02/11/2023 2.0  1.7 - 2.4 mg/dL Final   Performed at [facility] [address], [place], [place] [address]   Phosphorus 02/11/2023 3.7  2.5 - 4.6 mg/dL Final   Performed at [facility] [address], [place], [place] [address]   Vitamin B-12 02/11/2023 252  180 - 914  pg/mL Final   Comment: (NOTE) This assay is not validated for testing neonatal or myeloproliferative syndrome specimens for Vitamin B12 levels. Performed at [facility], [place] [address], [place], [place] [address]    Folate 02/11/2023 6.3  >5.9 ng/mL Final   Performed at [facility] [address] [place], [place] [address]   TSH 02/11/2023 3.115  0.350 - 4.500 uIU/mL Final   Comment: Performed by a 3rd Generation assay with a functional sensitivity of <=0.01 uIU/mL. Performed at [facility], [place] [address], [place], [place] [address]    SARS Coronavirus 2 by RT PCR 02/11/2023 NEGATIVE  NEGATIVE Final   Influenza A by PCR 02/11/2023 NEGATIVE  NEGATIVE Final   Influenza B by PCR 02/11/2023 NEGATIVE  NEGATIVE Final   Comment: (NOTE) The Xpert Xpress SARS-CoV-2/FLU/RSV plus assay is intended as an aid in the diagnosis of influenza from Nasopharyngeal swab specimens and should not be used as a sole basis for treatment. Nasal washings and aspirates are unacceptable for Xpert Xpress SARS-CoV-2/FLU/RSV testing.  Fact Sheet for Patients: EntrepreneurPulse.com.au  Fact Sheet for Healthcare Providers: IncredibleEmployment.be  This test is not yet approved or cleared by the [place] FDA and has been authorized for detection and/or diagnosis of SARS-CoV-2 by FDA under an Emergency Use Authorization (EUA). This EUA will remain in effect (meaning this test can be used) for the duration of the COVID-19 declaration under Section 564(b)(1) of the Act, 21 U.S.C. section 360bbb-3(b)(1), unless the authorization is terminated or revoked.     Resp Syncytial Virus by PCR 02/11/2023 NEGATIVE  NEGATIVE Final   Comment: (NOTE) Fact Sheet for Patients: EntrepreneurPulse.com.au  Fact Sheet for Healthcare Providers: IncredibleEmployment.be  This test is not yet approved  or cleared by the [place] and has been authorized for detection and/or diagnosis of SARS-CoV-2 by FDA under an Emergency Use Authorization (EUA). This EUA will remain in effect (meaning this test can be used) for the duration of the COVID-19 declaration under Section 564(b)(1) of the Act, 21 U.S.C. section 360bbb-3(b)(1), unless the authorization is terminated or revoked.  Performed at [facility], [place] [address] [place], [place] [address]    Potassium 02/14/2023 4.2  3.5 - 5.1 mmol/L Final   Performed at [facility], [place] [address], [place], [place] [address]  Admission on 11/06/2022, Discharged on 11/06/2022  Component Date Value Ref Range Status   WBC 11/06/2022 7.0  4.0 - 10.5 K/uL Final   RBC 11/06/2022 4.23  3.87 - 5.11 MIL/uL Final   Hemoglobin 11/06/2022 13.9  12.0 - 15.0 g/dL Final   HCT 11/06/2022 39.7  36.0 - 46.0 % Final   MCV 11/06/2022 93.9  80.0 - 100.0 fL Final   MCH 11/06/2022 32.9  26.0 - 34.0 pg Final   MCHC 11/06/2022 35.0  30.0 - 36.0 g/dL Final   RDW 11/06/2022 12.6  11.5 - 15.5 % Final   Platelets 11/06/2022 314  150 - 400 K/uL Final   nRBC 11/06/2022 0.0  0.0 - 0.2 % Final   Neutrophils Relative % 11/06/2022 64  % Final   Neutro Abs 11/06/2022 4.5  1.7 - 7.7 K/uL Final   Lymphocytes Relative 11/06/2022 25  % Final   Lymphs Abs 11/06/2022 1.8  0.7 - 4.0 K/uL Final   Monocytes Relative 11/06/2022 7  % Final   Monocytes Absolute 11/06/2022 0.5  0.1 - 1.0 K/uL Final   Eosinophils Relative 11/06/2022 1  % Final   Eosinophils Absolute 11/06/2022 0.1  0.0 - 0.5 K/uL Final   Basophils Relative 11/06/2022 2  % Final   Basophils Absolute 11/06/2022 0.1  0.0 - 0.1 K/uL Final   Immature Granulocytes 11/06/2022 1  % Final   Abs Immature Granulocytes 11/06/2022 0.04  0.00 - 0.07 K/uL Final   Performed at [facility], [place] [address], [place], [place] [address]   Sodium 11/06/2022 137  135 - 145 mmol/L Final   Potassium 11/06/2022 3.0 (L)  3.5 - 5.1 mmol/L  Final   Chloride 11/06/2022 100  98 - 111 mmol/L Final   CO2 11/06/2022 21 (L)  22 - 32 mmol/L Final   Glucose, Bld 11/06/2022 117 (H)  70 - 99 mg/dL Final   Glucose reference range applies only to samples taken after fasting for at least 8 hours.   BUN 11/06/2022 13  8 - 23 mg/dL Final   Creatinine, Ser 11/06/2022 0.48  0.44 - 1.00 mg/dL Final   Calcium 11/06/2022 9.4  8.9 - 10.3 mg/dL Final   Total Protein 11/06/2022 7.6  6.5 - 8.1 g/dL Final   Albumin 11/06/2022 4.3  3.5 - 5.0 g/dL Final   AST 11/06/2022 45 (H)  15 - 41 U/L Final   ALT 11/06/2022 38  0 - 44 U/L Final   Alkaline Phosphatase 11/06/2022 74  38 - 126 U/L Final   Total Bilirubin 11/06/2022 1.6 (H)  0.3 - 1.2 mg/dL Final   GFR, Estimated 11/06/2022 >60  >60 mL/min Final   Comment: (NOTE) Calculated using the CKD-EPI Creatinine Equation (2021)    Anion gap 11/06/2022 16 (H)  5 - 15 Final   Performed at [facility], [place] [address] [place], [place]  [address]   Alcohol, Ethyl (B) 11/06/2022 54 (H)  <10 mg/dL Final   Comment: (NOTE) Lowest detectable limit for serum alcohol is 10 mg/dL.  For medical purposes only. Performed at [facility], [place] [address], [place], [place] [address]     Blood Alcohol level:  Lab Results  Component Value Date   ETH 72 (H) 02/11/2023   ETH 54 (H) [id number]    Metabolic Disorder Labs: No results found for: "HGBA1C", "MPG" No results found for: "PROLACTIN" No results found for: "CHOL", "TRIG", "HDL", "CHOLHDL", "VLDL", "[place]"  Therapeutic Lab Levels: No results found for: "LITHIUM" No results found for: "VALPROATE" No results found for: "CBMZ"  Physical Findings   PHQ2-9    [place] [facility] from 02/11/2023 in [facility]  PHQ-2 Total Score 4  PHQ-9 Total Score 6      [place] [facility] from 02/11/2023 in [facility] from 11/06/2022 in [facility] at  [place] No Risk No Risk        Musculoskeletal  Strength & Muscle Tone: within normal limits Gait & Station: normal Patient leans: N/A  Psychiatric Specialty Exam  Presentation  General Appearance:  Casual  Eye Contact: Good  Speech: Clear and Coherent; Normal Rate  Speech Volume: Normal  Handedness: Right   Mood and Affect  Mood: Depressed  Affect: Congruent   Thought Process  Thought Processes: Coherent  Descriptions of Associations:Intact  Orientation:Full (Time, Place and Person)  Thought Content:Logical     Hallucinations:Hallucinations: None  Ideas of Reference:None  Suicidal Thoughts:Suicidal Thoughts: No  Homicidal Thoughts:Homicidal Thoughts: No   Sensorium  Memory: Immediate Good; Recent Good; Remote Good  Judgment: Fair  Insight: Fair   Community education officer  Concentration: Good  Attention Span: Good  Recall: Good  Fund of Knowledge: Good  Language: Good   Psychomotor Activity  Psychomotor Activity: Psychomotor Activity: Normal   Assets  Assets: Communication Skills; Desire for Improvement; Financial Resources/Insurance; Physical Health; Resilience; Social Support   Sleep  Sleep: Sleep: Good   No data recorded  Physical Exam  Physical Exam Vitals and nursing note reviewed.  Constitutional:      General: She is not in acute distress.    Appearance: Normal appearance. She is not ill-appearing.  HENT:     Head: Normocephalic.  Eyes:     General:        Right eye: No discharge.        Left eye: No discharge.     Conjunctiva/sclera: Conjunctivae normal.  Cardiovascular:     Rate and Rhythm: Normal rate.  Pulmonary:     Effort: Pulmonary effort is normal. No respiratory distress.  Musculoskeletal:        General: Normal range of motion.     Cervical back: Normal range of motion.  Skin:    Coloration: Skin is not jaundiced or pale.  Neurological:     Mental  Status: She is alert and oriented to person, place, and time.  Psychiatric:        Attention and Perception: Attention normal. She does not perceive auditory or visual hallucinations.        Mood and Affect: Affect normal. Mood is depressed.        Speech: Speech normal.        Behavior: Behavior normal. Behavior is cooperative.        Thought Content: Thought content normal.  Cognition and Memory: Cognition normal.        Judgment: Judgment normal.    Review of Systems  Constitutional: Negative.   HENT: Negative.    Eyes: Negative.   Respiratory: Negative.    Cardiovascular: Negative.   Gastrointestinal: Negative.   Musculoskeletal: Negative.   Skin: Negative.   Neurological: Negative.   Psychiatric/Behavioral:  Positive for depression.    Blood pressure 131/83, pulse (!) 17, temperature 98.5 F (36.9 C), temperature source Oral, resp. rate 17, SpO2 98 %. There is no height or weight on file to calculate BMI.  Treatment Plan Summary:  Disposition: Discharge planning is set for 02/17/2023.  Patient is in agreement.Will continue to have daily contact with patient to assess and evaluate symptoms and progress in treatment and Medication management.   Patient has outpatient appointments in place for medication management and therapy.  Patient is requesting to be discharged home on Klonopin 0.5 mg twice daily, which she is currently prescribed.   "[facility]: Appointment was already scheduled for February 19, 2023 at 3:30pm with MD Feliciana Rossetti for medication management/geriatric care.  [facility]: Appointment was scheduled for March 15, 2023 at 10:00am with Warren Danes".    Corky Sox, MD 02/16/2023 12:03 PM

## 2023-02-16 NOTE — Group Note (Unsigned)
Group Topic: Understanding Self  Group Date: 02/16/2023 Start Time: 1000 End Time: [place] Facilitators: Quentin Cornwall B  Department: [facility]  Number of Participants: 6  Group Focus: feeling awareness/expression Treatment Modality:  Psychoeducation Interventions utilized were group exercise Purpose: regain self-worth   Name: Stephanie Riley Date of Birth: January 12, 1951  MR: [id number]    Level of Participation: {THERAPIES; PSYCH GROUP PARTICIPATION [id number] Quality of Participation: {THERAPIES; PSYCH QUALITY OF PARTICIPATION:23992} Interactions with others: {THERAPIES; PSYCH INTERACTIONS:23993} Mood/Affect: {THERAPIES; PSYCH MOOD/AFFECT:23994} Triggers (if applicable): *** Cognition: {THERAPIES; PSYCH COGNITION:23995} Progress: {THERAPIES; PSYCH PROGRESS:23997} Response: *** Plan: {THERAPIES; PSYCH [id number]  Patients Problems:  Patient Active Problem List   Diagnosis Date Noted   Alcohol use disorder, moderate, dependence ([place]) 02/12/2023   Benzodiazepine withdrawal without complication ([place]) [id number]

## 2023-02-16 NOTE — ED Notes (Signed)
Patient A&Ox4. Denies intent to harm self/others when asked. Denies A/VH. Patient denies any physical complaints when asked. No acute distress noted. Routine safety checks conducted according to facility protocol. Encouraged patient to notify staff if thoughts of harm toward self or others arise. Patient verbalize understanding and agreement. Will continue to monitor for safety.    

## 2023-02-16 NOTE — ED Notes (Signed)
Pt sleeping in no acute distress. RR even and unlabored. Environment secured. Will continue to monitor for safety. 

## 2023-02-16 NOTE — Group Note (Signed)
Group Topic: Understanding Self  Group Date: 02/16/2023 Start Time: 1000 End Time: [place] Facilitators: Quentin Cornwall B  Department: [facility]  Number of Participants: 6  Group Focus: acceptance and self-awareness Treatment Modality:  Psychoeducation Interventions utilized were group exercise Purpose: regain self-worth  Name: Stephanie Riley Date of Birth: November 02, 1951  MR: [id number]    Level of Participation: moderate Quality of Participation: attentive and cooperative Interactions with others: gave feedback Mood/Affect: positive Triggers (if applicable): na Cognition: coherent/clear Progress: Moderate Response: na Plan: follow-up needed  Patients Problems:  Patient Active Problem List   Diagnosis Date Noted   Alcohol use disorder, moderate, dependence ([place]) 02/12/2023   Benzodiazepine withdrawal without complication ([place]) [id number]

## 2023-02-16 NOTE — ED Notes (Signed)
Pt is in the bed sleeping. Respirations are even and unlabored. No acute distress noted. Will continue to monitor for safety. 

## 2023-02-16 NOTE — ED Notes (Signed)
Patient currently in her room, no distress noted, will continue to monitor patient for safety

## 2023-02-16 NOTE — ED Notes (Signed)
Pt is currently sleeping, no distress noted, environmental check complete, will continue to monitor patient for safety. ? ?

## 2023-02-17 DIAGNOSIS — F329 Major depressive disorder, single episode, unspecified: Secondary | ICD-10-CM | POA: Diagnosis not present

## 2023-02-17 MED ORDER — SERTRALINE HCL 100 MG PO TABS: 200.0000 mg | ORAL_TABLET | Freq: Every day | ORAL | 0 refills | Status: DC

## 2023-02-17 MED ORDER — CLONAZEPAM 0.5 MG PO TABS: 0.5000 mg | ORAL_TABLET | Freq: Two times a day (BID) | ORAL | 0 refills | Status: DC

## 2023-02-17 NOTE — ED Provider Notes (Signed)
FBC/OBS ASAP Discharge Summary  Date and Time: 02/17/2023 1:36 PM  Name: Stephanie Riley  MRN:  [id number]   Discharge Diagnoses:  Final diagnoses:  Alcohol use disorder, moderate, dependence ([place])  Benzodiazepine withdrawal without complication ([place])    Subjective:  Information obtained on initial evaluation: The patient is a 72 year old female, with a history of alcohol use disorder and chronic benzodiazepine use.  No history of psychiatric admissions or psychiatric care documented in the medical record.  She presented to the [facility] behavioral urgent care reporting excessive alcohol use in the context of being taken off of her benzodiazepines.   On assessment 3/8, the patient exhibits a linear and logical thought process.  She appears confused at times during the interview, but this seems to be a significant improvement compared to her mental status yesterday.   Relevant social history obtained by the patient's report.  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  (Her story at times is not logical and I have a high suspicion that she has had severe alcohol use disorder for a long period of time).  She reports that she was separated from her husband in July but that he comes over "every day, walks the dog, and we chat".  She reports that they are doing marriage counseling right now.   The patient denies issues with depression recently and says she has no suicidal thoughts now or in the recent past.  She denies experiencing any withdrawal.  Her memory appears impaired, both short-term and long-term.   Regarding the plan, the patient states that she wants to remain on her longtime dose of Klonopin 0.5 mg twice daily.  She states that she then wants to follow-up outpatient with her primary care doctor and perhaps a psychiatrist.   She states that she does not have a problem with alcohol and is not interested in substance use treatment with residential rehab or CD IOP.  She requests to stay on Wellbutrin.  Discussed with her that we cannot prescribe Wellbutrin given her recent alcohol use issues and recent issues with benzodiazepine use, and the concern for seizure as a result.  Stay Summary:  Collateral was collected from the patient's husband, Khamille Spickerman, [phone].  He reports that since the patient quit her job as a Education officer, museum 15 years ago, she has had increasing issues with alcohol, drinking several bottles of wine per day.  Recently the patient's husband and one of her children had an intervention and stated that she would not be able to see the grandkids and that she would be separated from her husband if she did not quit drinking and go to [facility] for rehab.  The patient left [facility] after the initial evaluation.  She eventually went to [facility] treatment center, where she denied having any alcohol issues.  She eventually ended up at [facility] [place] after roaming the streets of [place] late at night.  Throughout all of this the patient has been fixated on continuing to take her Klonopin.  Per her husband, the patient has stated over the past several months that she only drinks alcohol because of benzodiazepine withdrawal.  He says this is untrue and that the patient has had alcohol issues for a long period of time.  On interview throughout her stay, the patient demonstrates little insight  into her alcohol use problems.  During the interviews she is fixated on making sure she will be able to continue taking Klonopin after discharge.  It is notable that the patient has some cognitive deficits, clearly difficulty with her memory.  Recommend strongly that the patient be tapered off her benzodiazepines.  Unfortunately, this is not the right setting for this to occur, because benzodiazepine withdrawal can be  severe and even deadly, especially for a patient like her who has been on benzodiazepines for many years.  On the day of discharge her husband reported that her PCP appointment was actually moved to March 28, significantly further out than we had been planning on.  Discussed with him that we will write a prescription for Klonopin until that appointment.  He agreed to administer the medication to the patient as prescribed so that the patient is not able to abuse the medication.  The patient denies auditory/visual hallucinations.  The patient reports good mood, appetite, and sleep. They deny suicidal and homicidal thoughts. The patient denies side effects from their medications.  Review of systems as below. The patient denies experiencing any withdrawal symptoms.   Total Time spent with patient: 20 minutes  Past Psychiatric History: as above Past Medical History: as above Family History: none Family Psychiatric History: none Social History: as above Tobacco Cessation:  A prescription for an FDA-approved tobacco cessation medication provided at discharge   Current Medications:  Current Facility-Administered Medications  Medication Dose Route Frequency Provider Last Rate Last Admin   acetaminophen (TYLENOL) tablet 650 mg  650 mg Oral Q6H PRN Rosezetta Schlatter, MD       alum & mag hydroxide-simeth (MAALOX/MYLANTA) 200-200-20 MG/5ML suspension 30 mL  30 mL Oral Q4H PRN Rosezetta Schlatter, MD   30 mL at 02/13/23 1856   amLODipine (NORVASC) tablet 10 mg  10 mg Oral QHS Tharon Aquas, NP   10 mg at 02/16/23 2102   clonazePAM (KLONOPIN) tablet 0.5 mg  0.5 mg Oral BID Rosezetta Schlatter, MD   0.5 mg at 02/17/23 0939   losartan (COZAAR) tablet 100 mg  100 mg Oral Daily Tharon Aquas, NP   100 mg at 02/17/23 [id number]   And   hydrochlorothiazide (HYDRODIURIL) tablet 12.5 mg  12.5 mg Oral Daily Tharon Aquas, NP   12.5 mg at 02/17/23 [id number]   hydrOXYzine (ATARAX) tablet 25 mg  25 mg Oral TID PRN  A[place], Ene A, NP   25 mg at 02/16/23 1653   magnesium hydroxide (MILK OF MAGNESIA) suspension 30 mL  30 mL Oral Daily PRN Rosezetta Schlatter, MD       multivitamin with minerals tablet 1 tablet  1 tablet Oral Daily Rosezetta Schlatter, MD   1 tablet at 02/17/23 0939   sertraline (ZOLOFT) tablet 200 mg  200 mg Oral Daily Rosezetta Schlatter, MD   200 mg at 02/17/23 [id number]   thiamine (VITAMIN B1) tablet 100 mg  100 mg Oral Daily Rosezetta Schlatter, MD   100 mg at 02/17/23 0939   traZODone (DESYREL) tablet 100 mg  100 mg Oral QHS Damita Dunnings B, MD   100 mg at 02/16/23 2102   Current Outpatient Medications  Medication Sig Dispense Refill   amLODipine (NORVASC) 10 MG tablet Take 10 mg by mouth daily.     losartan-hydrochlorothiazide (HYZAAR) 100-12.5 MG tablet Take 1 tablet by mouth daily.     traZODone (DESYREL) 100 MG tablet Take 100 mg by mouth at bedtime.     clonazePAM (KLONOPIN)  0.5 MG tablet Take 1 tablet (0.5 mg total) by mouth 2 (two) times daily. 34 tablet 0   sertraline (ZOLOFT) 100 MG tablet Take 2 tablets (200 mg total) by mouth daily. 60 tablet 0    PTA Medications:  Facility Ordered Medications  Medication   [COMPLETED] ondansetron (ZOFRAN-ODT) disintegrating tablet 4 mg   [EXPIRED] ondansetron (ZOFRAN-ODT) 4 MG disintegrating tablet   acetaminophen (TYLENOL) tablet 650 mg   alum & mag hydroxide-simeth (MAALOX/MYLANTA) 200-200-20 MG/5ML suspension 30 mL   magnesium hydroxide (MILK OF MAGNESIA) suspension 30 mL   [COMPLETED] thiamine (VITAMIN B1) injection 100 mg   thiamine (VITAMIN B1) tablet 100 mg   multivitamin with minerals tablet 1 tablet   [EXPIRED] LORazepam (ATIVAN) tablet 1 mg   [EXPIRED] hydrOXYzine (ATARAX) tablet 25 mg   [EXPIRED] loperamide (IMODIUM) capsule 2-4 mg   [EXPIRED] ondansetron (ZOFRAN-ODT) disintegrating tablet 4 mg   [COMPLETED] potassium chloride (KLOR-CON M) CR tablet 30 mEq   amLODipine (NORVASC) tablet 10 mg   clonazePAM (KLONOPIN) tablet 0.5 mg    losartan (COZAAR) tablet 100 mg   And   hydrochlorothiazide (HYDRODIURIL) tablet 12.5 mg   sertraline (ZOLOFT) tablet 200 mg   traZODone (DESYREL) tablet 100 mg   hydrOXYzine (ATARAX) tablet 25 mg   [COMPLETED] polyethylene glycol (MIRALAX / GLYCOLAX) packet 17 g   PTA Medications  Medication Sig   losartan-hydrochlorothiazide (HYZAAR) 100-12.5 MG tablet Take 1 tablet by mouth daily.   amLODipine (NORVASC) 10 MG tablet Take 10 mg by mouth daily.   traZODone (DESYREL) 100 MG tablet Take 100 mg by mouth at bedtime.   clonazePAM (KLONOPIN) 0.5 MG tablet Take 1 tablet (0.5 mg total) by mouth 2 (two) times daily.   sertraline (ZOLOFT) 100 MG tablet Take 2 tablets (200 mg total) by mouth daily.       02/14/2023   11:48 AM 02/13/2023    9:59 AM  Depression screen PHQ 2/9  Decreased Interest 2 3  Down, Depressed, Hopeless 2 3  PHQ - 2 Score 4 6  Altered sleeping 0 1  Tired, decreased energy 2 2  Change in appetite 0 0  Feeling bad or failure about yourself  0 0  Trouble concentrating 0 0  Moving slowly or fidgety/restless 0 0  Suicidal thoughts 0 0  PHQ-9 Score 6 9  Difficult doing work/chores Somewhat difficult Somewhat difficult    Flowsheet Row ED from 02/11/2023 in [facility] ED from 11/06/2022 in [facility] Emergency Department at [place] No Risk No Risk      Musculoskeletal  Strength & Muscle Tone: within normal limits Gait & Station: normal Patient leans: N/A  Psychiatric Specialty Exam  Presentation General Appearance: Appropriate for Environment  Eye Contact:Fair  Speech:Clear and Coherent  Speech Volume:Normal  Handedness:-- (not assessed)   Mood and Affect  Mood:Euthymic  Affect:Congruent   Thought Process  Thought Processes:Coherent; Linear  Descriptions of Associations:Intact  Orientation:Full (Time, Place and Person)  Thought Content:Logical    Hallucinations:Hallucinations:  None  Ideas of Reference:None  Suicidal Thoughts:Suicidal Thoughts: No  Homicidal Thoughts:Homicidal Thoughts: No   Sensorium  Memory:Immediate Fair; Recent Fair; Remote Fair  Judgment:Fair  Insight:Fair   Executive Functions  Concentration:Fair  Attention Span:Fair  [place]   Psychomotor Activity  Psychomotor Activity:Psychomotor Activity: Normal   Assets  Assets:Communication Skills; Resilience   Sleep  Sleep:Sleep: Fair    Physical Exam Constitutional:  Appearance: the patient is not toxic-appearing.  Pulmonary:     Effort: Pulmonary effort is normal.  Neurological:     General: No focal deficit present.     Mental Status: the patient is alert and oriented to person, place, and time.   Review of Systems  Respiratory:  Negative for shortness of breath.   Cardiovascular:  Negative for chest pain.  Gastrointestinal:  Negative for abdominal pain, constipation, diarrhea, nausea and vomiting.  Neurological:  Negative for headaches.    BP 114/79   Pulse 99   Temp 98.9 F (37.2 C) (Tympanic)   Resp 16   SpO2 95%   Demographic Factors:  None pertinent   Loss Factors: NA   Historical Factors: NA   Risk Reduction Factors:   Positive social support, Positive therapeutic relationship, and Positive coping skills or problem solving skills   Continued Clinical Symptoms:  Substance use    Cognitive Features That Contribute To Risk:  None     Suicide Risk:  Mild   Plan Of Care/Follow-up recommendations:  Activity as tolerated. Diet as recommended by PCP. Keep all scheduled follow-up appointments as recommended.   Patient is instructed to take all prescribed medications as recommended. Report any side effects or adverse reactions to your outpatient psychiatrist. Patient is instructed to abstain from alcohol and illegal drugs while on prescription medications. In the event of worsening symptoms,  patient is instructed to call the crisis hotline, 911, or go to the nearest emergency department for evaluation and treatment.     Patient is also instructed prior to discharge to: Take all medications as prescribed by mental healthcare provider. Report any adverse effects and or reactions from the medicines to outpatient provider promptly. Patient has been instructed & cautioned: To not engage in alcohol and or illegal drug use while on prescription medicines. In the event of worsening symptoms,  patient is instructed to call the crisis hotline, 911 and or go to the nearest ED for appropriate evaluation and treatment of symptoms. To follow-up with primary care provider for other medical issues, concerns and or health care needs    Disposition: self-care (care of her husband)     Corky Sox, MD 02/17/2023, 1:36 PM

## 2023-02-17 NOTE — ED Notes (Signed)
Patient is awake and alert on unit.  She is presently sitting in dayroom eating breakfast.  Patient is disheveled and poorly groomed despite encouragement to attend to ADL's.  Patient is anxious but pleasant.  No evidence of withdrawal.  She is scheduled for discharge later today.

## 2023-02-17 NOTE — ED Notes (Signed)
Patient attended group meeting with the nurse

## 2023-02-17 NOTE — [facility] Group Notes (Signed)
[facility] LCSW Group Therapy Note  Date/Time: 02/16/23  Type of Therapy/Topic:  Group Therapy:  Balance in Life  Participation Level:  Did Not Attend  Summary of Patient Progress: Patient was informed of group time and activity, however decided not to participate. Patient was encouraged to participate in programming on the unit as able.   Lucius Conn, LCSW Clinical Social Worker [facility] Ph: [phone]

## 2023-02-17 NOTE — ED Notes (Signed)
Pt is currently sleeping, no distress noted, environmental check complete, will continue to monitor patient for safety. ? ?

## 2023-02-18 NOTE — ED Provider Notes (Signed)
Patient called regarding trazodone RX not being sent to pharmacy. Called in 30-day RX for Trazodone 100 mg qHS, no refills to [facility].   Rosezetta Schlatter, MD PGY-2 02/18/2023  4:29 PM [place] Department of Psychiatry

## 2023-02-26 ENCOUNTER — Telehealth (HOSPITAL_COMMUNITY): Payer: Self-pay | Admitting: Student in an Organized Health Care Education/Training Program

## 2023-02-26 NOTE — Telephone Encounter (Signed)
Patient called in wanting to speak with you ASAP before her appointment on 04/12. She stated that she is just feeling really depressed and is wanting to speak with you. You don't have any availability that would be sooner than she is already scheduled, but I let her know about the walk in hours. As well has downstairs if she feels as if she is in crisis. She said it's not emergent but that she is just really wanting to speak with you.

## 2023-03-02 NOTE — Telephone Encounter (Signed)
Thank you, I will see her at her scheduled appt

## 2023-03-03 ENCOUNTER — Other Ambulatory Visit (HOSPITAL_COMMUNITY): Payer: Self-pay | Admitting: Student in an Organized Health Care Education/Training Program

## 2023-03-03 ENCOUNTER — Telehealth (HOSPITAL_COMMUNITY): Payer: Self-pay | Admitting: Student in an Organized Health Care Education/Training Program

## 2023-03-03 DIAGNOSIS — F13931 Sedative, hypnotic or anxiolytic use, unspecified with withdrawal delirium: Secondary | ICD-10-CM

## 2023-03-03 NOTE — Progress Notes (Signed)
Initial Plan: 17-day supply of patient's Klonopin 0.5 mg twice daily, was set to start on 3/30 for patient.  This provider did see patient previously in the [facility] and is aware that patient has benzodiazepine withdrawal, with complications.  The plan was that patient be slightly titrated over the next few months off of benzodiazepines.  This provider was scheduled to see patient at her follow-up appointment from the [facility] discharge on 03/19/2023.  Update: Decided to not dispense patient's Klonopin 0.5 mg twice daily, provider called patient to let her know that she would have a prescription waiting for her.  However, during phone call patient endorsed that she had already been misusing her Klonopin taking more than she was prescribed on occasion.  Patient also endorsed that she had received some life-changing information-her husband wanting to divorce her.  Patient endorsed that her husband had been taking her around to doctor's appointments yesterday and today however, patient endorsed that she no longer cared about anything.  Patient was still at this location, provider recommended that she check into the urgent care since she was on location.  If patient had not been at this location would have recommended patient go to a medical ED.  Did update/brief providers in the urgent care on conversation with patient.  Also notified both patient and provider is that a refill will not be sent.  Patient is not using medications appropriately.  PGY-3 Damita Dunnings, MD

## 2023-03-11 ENCOUNTER — Telehealth (HOSPITAL_COMMUNITY): Payer: Self-pay | Admitting: Student in an Organized Health Care Education/Training Program

## 2023-03-11 NOTE — Telephone Encounter (Signed)
No not with me, I have not seen her Outpatient. She either needs to come as a walk-in or asks the person who just discharged her from last emergent interactions.

## 2023-03-19 ENCOUNTER — Other Ambulatory Visit (HOSPITAL_COMMUNITY)
Admission: EM | Admit: 2023-03-19 | Discharge: 2023-03-26 | Disposition: A | Discharge status: Home / Self Care | Payer: Medicare PPO | Attending: Psychiatry | Admitting: Psychiatry

## 2023-03-19 ENCOUNTER — Encounter (HOSPITAL_COMMUNITY): Payer: Self-pay | Admitting: Student in an Organized Health Care Education/Training Program

## 2023-03-19 ENCOUNTER — Ambulatory Visit (INDEPENDENT_AMBULATORY_CARE_PROVIDER_SITE_OTHER): Payer: Medicare PPO | Admitting: Student in an Organized Health Care Education/Training Program

## 2023-03-19 VITALS — BP 145/81 | HR 91 | Resp 20 | Wt 147.0 lb

## 2023-03-19 DIAGNOSIS — F13931 Sedative, hypnotic or anxiolytic use, unspecified with withdrawal delirium: Secondary | ICD-10-CM

## 2023-03-19 DIAGNOSIS — F1090 Alcohol use, unspecified, uncomplicated: Secondary | ICD-10-CM | POA: Diagnosis not present

## 2023-03-19 DIAGNOSIS — F1910 Other psychoactive substance abuse, uncomplicated: Secondary | ICD-10-CM | POA: Diagnosis not present

## 2023-03-19 DIAGNOSIS — F909 Attention-deficit hyperactivity disorder, unspecified type: Secondary | ICD-10-CM | POA: Insufficient documentation

## 2023-03-19 DIAGNOSIS — F1024 Alcohol dependence with alcohol-induced mood disorder: Secondary | ICD-10-CM | POA: Insufficient documentation

## 2023-03-19 DIAGNOSIS — F332 Major depressive disorder, recurrent severe without psychotic features: Secondary | ICD-10-CM | POA: Insufficient documentation

## 2023-03-19 DIAGNOSIS — F1010 Alcohol abuse, uncomplicated: Secondary | ICD-10-CM | POA: Insufficient documentation

## 2023-03-19 DIAGNOSIS — I10 Essential (primary) hypertension: Secondary | ICD-10-CM | POA: Insufficient documentation

## 2023-03-19 DIAGNOSIS — M1990 Unspecified osteoarthritis, unspecified site: Secondary | ICD-10-CM | POA: Insufficient documentation

## 2023-03-19 DIAGNOSIS — Z1152 Encounter for screening for COVID-19: Secondary | ICD-10-CM | POA: Diagnosis not present

## 2023-03-19 DIAGNOSIS — Z79899 Other long term (current) drug therapy: Secondary | ICD-10-CM | POA: Insufficient documentation

## 2023-03-19 DIAGNOSIS — F411 Generalized anxiety disorder: Secondary | ICD-10-CM

## 2023-03-19 DIAGNOSIS — F1020 Alcohol dependence, uncomplicated: Secondary | ICD-10-CM

## 2023-03-19 DIAGNOSIS — F1994 Other psychoactive substance use, unspecified with psychoactive substance-induced mood disorder: Secondary | ICD-10-CM

## 2023-03-19 DIAGNOSIS — Z789 Other specified health status: Secondary | ICD-10-CM

## 2023-03-19 DIAGNOSIS — F329 Major depressive disorder, single episode, unspecified: Secondary | ICD-10-CM | POA: Insufficient documentation

## 2023-03-19 DIAGNOSIS — F419 Anxiety disorder, unspecified: Secondary | ICD-10-CM | POA: Insufficient documentation

## 2023-03-19 LAB — COMPREHENSIVE METABOLIC PANEL
ALT: 29 U/L (ref 0–44)
AST: 37 U/L (ref 15–41)
Albumin: 4.5 g/dL (ref 3.5–5.0)
Alkaline Phosphatase: 81 U/L (ref 38–126)
Anion gap: 13 (ref 5–15)
BUN: 21 mg/dL (ref 8–23)
CO2: 24 mmol/L (ref 22–32)
Calcium: 9 mg/dL (ref 8.9–10.3)
Chloride: 95 mmol/L — ABNORMAL LOW (ref 98–111)
Creatinine, Ser: 0.95 mg/dL (ref 0.44–1.00)
GFR, Estimated: >60 mL/min (ref >60)
Glucose, Bld: 97 mg/dL (ref 70–99)
Potassium: 2.8 mmol/L — ABNORMAL LOW (ref 3.5–5.1)
Sodium: 132 mmol/L — ABNORMAL LOW (ref 135–145)
Total Bilirubin: 2.9 mg/dL — ABNORMAL HIGH (ref 0.3–1.2)
Total Protein: 7.1 g/dL (ref 6.5–8.1)

## 2023-03-19 LAB — URINALYSIS, ROUTINE W REFLEX MICROSCOPIC
Glucose, UA: NEGATIVE mg/dL
Hgb urine dipstick: NEGATIVE
Ketones, ur: 5 mg/dL — AB
Nitrite: NEGATIVE
Protein, ur: 100 mg/dL — AB
Specific Gravity, Urine: 1.025 (ref 1.005–1.030)
pH: 5 (ref 5.0–8.0)

## 2023-03-19 LAB — CBC WITH DIFFERENTIAL/PLATELET
Abs Immature Granulocytes: 0.06 10*3/uL (ref 0.00–0.07)
Basophils Absolute: 0.1 10*3/uL (ref 0.0–0.1)
Basophils Relative: 1 %
Eosinophils Absolute: 0 10*3/uL (ref 0.0–0.5)
Eosinophils Relative: 0 %
HCT: 35.3 % — ABNORMAL LOW (ref 36.0–46.0)
Hemoglobin: 11.8 g/dL — ABNORMAL LOW (ref 12.0–15.0)
Immature Granulocytes: 1 %
Lymphocytes Relative: 15 %
Lymphs Abs: 1.4 10*3/uL (ref 0.7–4.0)
MCH: 30.5 pg (ref 26.0–34.0)
MCHC: 33.4 g/dL (ref 30.0–36.0)
MCV: 91.2 fL (ref 80.0–100.0)
Monocytes Absolute: 0.9 10*3/uL (ref 0.1–1.0)
Monocytes Relative: 10 %
Neutro Abs: 6.8 10*3/uL (ref 1.7–7.7)
Neutrophils Relative %: 73 %
Platelets: 181 10*3/uL (ref 150–400)
RBC: 3.87 MIL/uL (ref 3.87–5.11)
RDW: 15.4 % (ref 11.5–15.5)
WBC: 9.3 10*3/uL (ref 4.0–10.5)
nRBC: 0 % (ref 0.0–0.2)

## 2023-03-19 LAB — ETHANOL: Alcohol, Ethyl (B): <10 mg/dL (ref <10)

## 2023-03-19 LAB — POC SARS CORONAVIRUS 2 AG: SARSCOV2ONAVIRUS 2 AG: NEGATIVE

## 2023-03-19 LAB — MAGNESIUM: Magnesium: 2.2 mg/dL (ref 1.7–2.4)

## 2023-03-19 LAB — SARS CORONAVIRUS 2 BY RT PCR: SARS Coronavirus 2 by RT PCR: NEGATIVE

## 2023-03-19 LAB — VITAMIN B12: Vitamin B-12: 398 pg/mL (ref 180–914)

## 2023-03-19 MED ORDER — CHLORDIAZEPOXIDE HCL 25 MG PO CAPS
25.0000 mg | ORAL_CAPSULE | Freq: Every day | ORAL | Status: AC
  Administered 2023-03-22: 25 mg via ORAL
  Filled 2023-03-19: qty 1

## 2023-03-19 MED ORDER — CHLORDIAZEPOXIDE HCL 25 MG PO CAPS
25.0000 mg | ORAL_CAPSULE | ORAL | Status: AC
  Administered 2023-03-21 (×2): 25 mg via ORAL
  Filled 2023-03-19 (×2): qty 1

## 2023-03-19 MED ORDER — CHLORDIAZEPOXIDE HCL 25 MG PO CAPS
25.0000 mg | ORAL_CAPSULE | Freq: Three times a day (TID) | ORAL | Status: AC
  Administered 2023-03-20 (×3): 25 mg via ORAL
  Filled 2023-03-19 (×3): qty 1

## 2023-03-19 MED ORDER — SERTRALINE HCL 100 MG PO TABS
100.0000 mg | ORAL_TABLET | Freq: Every day | ORAL | Status: DC
  Administered 2023-03-19 – 2023-03-26 (×8): 100 mg via ORAL
  Filled 2023-03-19 (×8): qty 1

## 2023-03-19 MED ORDER — LOPERAMIDE HCL 2 MG PO CAPS
2.0000 mg | ORAL_CAPSULE | ORAL | Status: AC | PRN
  Administered 2023-03-19 – 2023-03-22 (×5): 2 mg via ORAL
  Filled 2023-03-19 (×5): qty 1

## 2023-03-19 MED ORDER — BUSPIRONE HCL 5 MG PO TABS
5.0000 mg | ORAL_TABLET | Freq: Two times a day (BID) | ORAL | Status: DC
  Administered 2023-03-19 – 2023-03-26 (×14): 5 mg via ORAL
  Filled 2023-03-19 (×14): qty 1

## 2023-03-19 MED ORDER — LOSARTAN POTASSIUM 50 MG PO TABS
100.0000 mg | ORAL_TABLET | Freq: Every day | ORAL | Status: DC
  Administered 2023-03-19 – 2023-03-26 (×8): 100 mg via ORAL
  Filled 2023-03-19 (×8): qty 2

## 2023-03-19 MED ORDER — AMLODIPINE BESYLATE 10 MG PO TABS
10.0000 mg | ORAL_TABLET | Freq: Every day | ORAL | Status: DC
  Administered 2023-03-19 – 2023-03-26 (×8): 10 mg via ORAL
  Filled 2023-03-19 (×8): qty 1

## 2023-03-19 MED ORDER — HYDROXYZINE PAMOATE 25 MG PO CAPS
25.0000 mg | ORAL_CAPSULE | Freq: Two times a day (BID) | ORAL | 0 refills | Status: DC | PRN
Start: 2023-03-19 — End: 2023-03-26

## 2023-03-19 MED ORDER — VITAMIN B-12 1000 MCG PO TABS
1000.0000 ug | ORAL_TABLET | Freq: Every day | ORAL | Status: DC
  Administered 2023-03-19 – 2023-03-26 (×8): 1000 ug via ORAL
  Filled 2023-03-19 (×8): qty 1

## 2023-03-19 MED ORDER — CHLORDIAZEPOXIDE HCL 25 MG PO CAPS: 25.0000 mg | ORAL_CAPSULE | Freq: Four times a day (QID) | ORAL | Status: DC | PRN

## 2023-03-19 MED ORDER — TRAZODONE HCL 100 MG PO TABS
100.0000 mg | ORAL_TABLET | Freq: Every day | ORAL | Status: DC
  Administered 2023-03-19 – 2023-03-25 (×7): 100 mg via ORAL
  Filled 2023-03-19 (×7): qty 1

## 2023-03-19 MED ORDER — THIAMINE HCL 100 MG PO TABS
500.0000 mg | ORAL_TABLET | Freq: Three times a day (TID) | ORAL | Status: DC
  Administered 2023-03-19 – 2023-03-21 (×6): 500 mg via ORAL
  Filled 2023-03-19 (×10): qty 5

## 2023-03-19 MED ORDER — CHLORDIAZEPOXIDE HCL 25 MG PO CAPS
25.0000 mg | ORAL_CAPSULE | Freq: Four times a day (QID) | ORAL | Status: AC
  Administered 2023-03-19 (×2): 25 mg via ORAL
  Filled 2023-03-19 (×2): qty 1

## 2023-03-19 MED ORDER — SERTRALINE HCL 100 MG PO TABS: 200.0000 mg | ORAL_TABLET | Freq: Every day | ORAL | Status: DC

## 2023-03-19 MED ORDER — THIAMINE HCL 100 MG/ML IJ SOLN: 100.0000 mg | Freq: Once | INTRAMUSCULAR | Status: DC

## 2023-03-19 MED ORDER — ADULT MULTIVITAMIN W/MINERALS CH
1.0000 | ORAL_TABLET | Freq: Every day | ORAL | Status: DC
  Administered 2023-03-19 – 2023-03-26 (×8): 1 via ORAL
  Filled 2023-03-19 (×8): qty 1

## 2023-03-19 MED ORDER — ACETAMINOPHEN 325 MG PO TABS: 650.0000 mg | ORAL_TABLET | Freq: Four times a day (QID) | ORAL | Status: DC | PRN

## 2023-03-19 MED ORDER — ONDANSETRON 4 MG PO TBDP: 4.0000 mg | ORAL_TABLET | Freq: Four times a day (QID) | ORAL | Status: AC | PRN

## 2023-03-19 MED ORDER — LOSARTAN POTASSIUM-HCTZ 100-12.5 MG PO TABS: 1.0000 | ORAL_TABLET | Freq: Every day | ORAL | Status: DC

## 2023-03-19 MED ORDER — HYDROCHLOROTHIAZIDE 12.5 MG PO TABS
12.5000 mg | ORAL_TABLET | Freq: Every day | ORAL | Status: DC
  Administered 2023-03-19 – 2023-03-26 (×8): 12.5 mg via ORAL
  Filled 2023-03-19 (×8): qty 1

## 2023-03-19 MED ORDER — HYDROXYZINE PAMOATE 25 MG PO CAPS
25.0000 mg | ORAL_CAPSULE | Freq: Two times a day (BID) | ORAL | Status: DC | PRN
  Filled 2023-03-19: qty 1

## 2023-03-19 MED ORDER — ALUM & MAG HYDROXIDE-SIMETH 200-200-20 MG/5ML PO SUSP: 30.0000 mL | ORAL | Status: DC | PRN

## 2023-03-19 MED ORDER — CHLORDIAZEPOXIDE HCL 25 MG PO CAPS: 25.0000 mg | ORAL_CAPSULE | Freq: Four times a day (QID) | ORAL | Status: AC | PRN

## 2023-03-19 MED ORDER — MAGNESIUM HYDROXIDE 400 MG/5ML PO SUSP: 30.0000 mL | Freq: Every day | ORAL | Status: DC | PRN

## 2023-03-19 NOTE — Discharge Instructions (Addendum)
Patient is instructed prior to discharge to:  Take all medications as prescribed by his/her mental healthcare provider. Report any adverse effects and or reactions from the medicines to his/her outpatient provider promptly. Keep all scheduled appointments, to ensure that you are getting refills on time and to avoid any interruption in your medication.  If you are unable to keep an appointment call to reschedule.  Be sure to follow-up with resources and follow-up appointments provided.  Patient has been instructed & cautioned: To not engage in alcohol and or illegal drug use while on prescription medicines. In the event of worsening symptoms, patient is instructed to call the crisis hotline, 911 and or go to the nearest ED for appropriate evaluation and treatment of symptoms. To follow-up with his/her primary care provider for your other medical issues, concerns and or health care needs.  Information: -National Suicide Prevention Lifeline 1-800-SUICIDE or 760-289-4581.  -988 offers 24/7 access to trained crisis counselors who can help people experiencing mental health-related distress. People can call or text 988 or chat 988lifeline.org for themselves or if they are worried about a loved one who may need crisis support.     [facility] [place]  [phone]  Mental Health & Drug Addiction Programs at [facility] in [place] near [place] offers a short term acute recovery-oriented treatment program for mental health and substance abuse issues. The program is designed to rapidly identify treatment strategies to promote recovery; provide the initial stabilization needed to return to work, home, family or other community placement and facilitate discharge for a seamless transition to the next step of you treatment plan.  Our mental health and substance program provides an interdisciplinary team of staff who serve as experts to  collaborate with the client to assist in identifying and reaching treatment goals. Our program is group focused, so patients can draw on the input from staff, peers and the treatment milieu as they regain stability and prepare for return to the community.    [facility] [address]  [place] [address] [phone]  Inpatient detox is approached by using the most effective medications available to help reduce the uncomfortable and unpleasant withdrawal symptoms which often prevent patients from seeking addiction treatment from drugs or alcohol.  According to the Substance Abuse and Mental Health Services Administration, close to 20 million people aged 62 and older struggle with substance use disorder yearly. The inpatient substance abuse treatment program at [facility] can help with the journey of living an addiction-free lifestyle.
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
## 2023-03-19 NOTE — Progress Notes (Signed)
   03/19/23 1404  [facility] Triage Screening (Walk-ins at [facility] only)  How Did You Hear About [place]? Other (Comment) (psychiatrist)  What Is the Reason for Your Visit/Call Today? "I'm checking in" because Dr Morrie Sheldon  recommend me to go into detox from ETOH during her appointment today. Reports she needs to go four days without alcohol to get on librium. Pt reports she drunk 8 ounces of wine last night and she drinks about 3+ glasses of wine a day for about a week. Pt was discharged from [facility] a week ago. Pt states "the Dr. wants me off of everything". Pt receives med mgt at [facility] with a dx of depression. Pt reports her drinking increased after separating from her husband July of 2023. Pt denies SI, HI, AVH, SIB. No legal issues, no housing issues.  How Long Has This Been Causing You Problems? 1 wk - 1 month  Have You Recently Had Any Thoughts About Hurting Yourself? No  Are You Planning to Commit Suicide/Harm Yourself At This time? No  Have you Recently Had Thoughts About Hurting Someone [name]? No  Are You Planning To Harm Someone At This Time? No  Are you currently experiencing any auditory, visual or other hallucinations? No  Have You Used Any Alcohol or Drugs in the Past 24 Hours? No  Do you have any current medical co-morbidities that require immediate attention? No  Clinician description of patient physical appearance/behavior: calm  What Do You Feel Would Help You the Most Today? Treatment for Depression or other mood problem  If access to [facility] Urgent [facility] was not available, would you have sought care in the Emergency Department? No  Determination of Need Routine (7 days)  Options For Referral Medication Management;Outpatient Therapy;Facility-Based Crisis

## 2023-03-19 NOTE — ED Notes (Signed)
Pt is in the dayroom watching TV with peers. Pt denies SI/HI/AVH. No acute distress noted. Will continue to monitor for safety. 
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
## 2023-03-19 NOTE — ED Notes (Signed)
Patient is sleeping. Respirations equal and unlabored, skin warm and dry. No change in assessment or acuity. Routine safety checks conducted according to facility protocol. Will continue to monitor for safety.   

## 2023-03-19 NOTE — ED Provider Notes (Signed)
Facility Based Crisis Admission H&P  Date: 03/19/23 Patient Name: Stephanie Riley MRN: [id number] Chief Complaint: Facility based crisis unit admission  Diagnoses:  Final diagnoses:  Admits to alcohol use    HPI: Patient presents voluntarily to [facility] behavioral health.  Patient encouraged to seek assessment by outpatient provider, Dr.Jai McQuilla.   Aram Beecham assessed by this nurse practitioner face-to-face.  She is seated in assessment area, no apparent distress.  She is alert and oriented, pleasant and cooperative during assessment.  She presents with euthymic mood, congruent affect.  Chart reviewed and patient evaluated along with Dr. Nelly Rout on 03/19/2023.  Patient states "Dr. Morrie Sheldon wanted me to come down here and go to [facility], she does not want me to drink any alcohol at all.  I do not want to go to [facility] has been there before and it is boring.  I do not want to be admitted."  Patient states "I know my doctors have my best interest at heart, I will go to [facility]."  Patient recently admitted to facility based crisis unit.  Discharge from facility based crisis on 02/17/2023.  Patient reports maintained sobriety for 2 weeks after discharge.  She relapsed on alcohol 2 weeks ago.  Consuming an average of 3 glasses of wine per day for the last 2 weeks.  Most recent alcohol use at 8 PM last night when she consumed 8 ounces of wine.  Patient reports she believes she should be permitted to use alcohol.  Anjel recently using more of her benzodiazepine medication, clonazepam, than prescribed.  Current plan reviewed with patient by outpatient provider includes chlordiazepoxide taper during facility based crisis unit admission.  Recent stressors include separation from her husband, of 50 years, in July 2023.  Patient reports separation triggered by both spouses alcohol use.  Lashaye denies suicidal and homicidal ideation.  She easily contracts verbally for safety at this time.  She denies  auditory and visual hallucinations.  There is no evidence of delusional thought content no indication that patient is responding to internal stimuli.  She denies symptoms of paranoia.  Patient resides alone in [place].  She denies access to weapons.  She denies substance use aside from alcohol.  She endorses average sleep and appetite.  Patient offered support and encouragement.  She agrees with plan for facility based crisis unit admission.  Reviewed medications, discussed potential side effects and patient offered opportunity to ask questions.  Patient confirms she will remain full CODE STATUS.  PHQ 2-9:  Flowsheet Row ED from 02/11/2023 in [facility]  Thoughts that you would be better off dead, or of hurting yourself in some way Not at all  PHQ-9 Total Score 4       Flowsheet Row ED from 03/19/2023 in [facility] ED from 02/11/2023 in [facility] ED from 11/06/2022 in [facility] Emergency Department at [facility]  C-SSRS RISK CATEGORY No Risk No Risk No Risk        Total Time spent with patient: 30 minutes  Musculoskeletal  Strength & Muscle Tone: within normal limits Gait & Station: normal Patient leans: N/A  Psychiatric Specialty Exam  Presentation General Appearance:  Appropriate for Environment; Casual  Eye Contact: Good  Speech: Clear and Coherent; Normal Rate  Speech Volume: Normal  Handedness: Right   Mood and Affect  Mood: Euthymic  Affect: Appropriate; Congruent   Thought Process  Thought Processes: Coherent; Goal Directed; Linear  Descriptions of  Associations:Intact  Orientation:Full (Time, Place and Person)  Thought Content:Logical; WDL    Hallucinations:Hallucinations: None  Ideas of Reference:None  Suicidal Thoughts:Suicidal Thoughts: No  Homicidal Thoughts:Homicidal Thoughts: No   Sensorium  Memory: Immediate  Good  Judgment: Fair  Insight: Fair   Executive Functions  Concentration: Good  Attention Span: Good  Recall: Good  Fund of Knowledge: Good  Language: Good   Psychomotor Activity  Psychomotor Activity: Psychomotor Activity: Normal   Assets  Assets: Communication Skills; Desire for Improvement; Financial Resources/Insurance; Housing; Physical Health; Resilience; Social Support   Sleep  Sleep: Sleep: Fair   No data recorded  Physical Exam Vitals and nursing note reviewed.  Constitutional:      Appearance: Normal appearance. She is well-developed.  HENT:     Head: Normocephalic and atraumatic.     Nose: Nose normal.  Cardiovascular:     Rate and Rhythm: Normal rate.  Pulmonary:     Effort: Pulmonary effort is normal.  Musculoskeletal:        General: Normal range of motion.     Cervical back: Normal range of motion.  Skin:    General: Skin is warm and dry.  Neurological:     Mental Status: She is alert and oriented to person, place, and time.  Psychiatric:        Attention and Perception: Attention and perception normal.        Mood and Affect: Mood and affect normal.        Speech: Speech normal.        Behavior: Behavior normal. Behavior is cooperative.        Thought Content: Thought content normal.        Cognition and Memory: Cognition and memory normal.    Review of Systems  Constitutional: Negative.   HENT: Negative.    Eyes: Negative.   Respiratory: Negative.    Cardiovascular: Negative.   Gastrointestinal: Negative.   Genitourinary: Negative.   Musculoskeletal: Negative.   Skin: Negative.   Neurological: Negative.   Psychiatric/Behavioral:  Positive for substance abuse.     Blood pressure (!) 148/86, pulse 85, temperature 98.1 F (36.7 C), temperature source Oral, resp. rate 19, height 5\' 6"  (1.676 m), weight 147 lb (66.7 kg), SpO2 99 %. Body mass index is 23.73 kg/m.  Past Psychiatric History: Alcohol use disorder,  benzodiazepine use/benzodiazepine withdrawal  Is the patient at risk to self? No  Has the patient been a risk to self in the past 6 months? No .    Has the patient been a risk to self within the distant past? No   Is the patient a risk to others? No   Has the patient been a risk to others in the past 6 months? No   Has the patient been a risk to others within the distant past? No   Past Medical History: Knee pain, laryngeal spasm, osteoarthritis, hypertension Family History: None reported Social History: Alcohol use disorder, resides alone  Last Labs:  Admission on 03/19/2023  Component Date Value Ref Range Status   SARSCOV2ONAVIRUS 2 AG 03/19/2023 NEGATIVE  NEGATIVE Final   Comment: (NOTE) SARS-CoV-2 antigen NOT DETECTED.   Negative results are presumptive.  Negative results do not preclude SARS-CoV-2 infection and should not be used as the sole basis for treatment or other patient management decisions, including infection  control decisions, particularly in the presence of clinical signs and  symptoms consistent with COVID-19, or in those who have been in contact with the virus.  Negative results must be combined with clinical observations, patient history, and epidemiological information. The expected result is Negative.  Fact Sheet for Patients: https://www.jennings-kim.com/  Fact Sheet for Healthcare Providers: https://alexander-rogers.biz/  This test is not yet approved or cleared by the [place] FDA and  has been authorized for detection and/or diagnosis of SARS-CoV-2 by FDA under an Emergency Use Authorization (EUA).  This EUA will remain in effect (meaning this test can be used) for the duration of  the COV                          ID-19 declaration under Section 564(b)(1) of the Act, 21 U.S.C. section 360bbb-3(b)(1), unless the authorization is terminated or revoked sooner.    Admission on 02/11/2023, Discharged on 02/17/2023   Component Date Value Ref Range Status   Alcohol, Ethyl (B) 02/11/2023 72 (H)  <10 mg/dL Final   Comment: (NOTE) Lowest detectable limit for serum alcohol is 10 mg/dL.  For medical purposes only. Performed at [facility], [address] [place], [place] [address]    Sodium 02/11/2023 136  135 - 145 mmol/L Final   Potassium 02/11/2023 3.2 (L)  3.5 - 5.1 mmol/L Final   Chloride 02/11/2023 97 (L)  98 - 111 mmol/L Final   CO2 02/11/2023 24  22 - 32 mmol/L Final   Glucose, Bld 02/11/2023 97  70 - 99 mg/dL Final   Glucose reference range applies only to samples taken after fasting for at least 8 hours.   BUN 02/11/2023 7 (L)  8 - 23 mg/dL Final   Creatinine, Ser 02/11/2023 0.47  0.44 - 1.00 mg/dL Final   Calcium 60/45/4098 9.2  8.9 - 10.3 mg/dL Final   Total Protein 11/91/4782 7.7  6.5 - 8.1 g/dL Final   Albumin 95/62/1308 4.7  3.5 - 5.0 g/dL Final   AST 65/78/4696 35  15 - 41 U/L Final   ALT 02/11/2023 34  0 - 44 U/L Final   Alkaline Phosphatase 02/11/2023 91  38 - 126 U/L Final   Total Bilirubin 02/11/2023 1.7 (H)  0.3 - 1.2 mg/dL Final   GFR, Estimated 02/11/2023 >60  >60 mL/min Final   Comment: (NOTE) Calculated using the CKD-EPI Creatinine Equation (2021)    Anion gap 02/11/2023 15  5 - 15 Final   Performed at [facility], [address] [place], [place] [address]   WBC 02/11/2023 9.2  4.0 - 10.5 K/uL Final   RBC 02/11/2023 4.23  3.87 - 5.11 MIL/uL Final   Hemoglobin 02/11/2023 13.4  12.0 - 15.0 g/dL Final   HCT 41/32/4401 38.8  36.0 - 46.0 % Final   MCV 02/11/2023 91.7  80.0 - 100.0 fL Final   MCH 02/11/2023 31.7  26.0 - 34.0 pg Final   MCHC 02/11/2023 34.5  30.0 - 36.0 g/dL Final   RDW 02/72/5366 13.0  11.5 - 15.5 % Final   Platelets 02/11/2023 422 (H)  150 - 400 K/uL Final   nRBC 02/11/2023 0.0  0.0 - 0.2 % Final   Performed at [facility], [address], [place], [place] [address]   Magnesium 02/11/2023 2.0  1.7 - 2.4 mg/dL Final   Performed at  [facility], [address], [place], [place] [address]   Phosphorus 02/11/2023 3.7  2.5 - 4.6 mg/dL Final   Performed at [facility], [address] [place], [place] [address]   Vitamin  B-12 02/11/2023 252  180 - 914 pg/mL Final   Comment: (NOTE) This assay is not validated for testing neonatal or myeloproliferative syndrome specimens for Vitamin B12 levels. Performed at [facility], [address], [place], [place] [address]    Folate 02/11/2023 6.3  >5.9 ng/mL Final   Performed at [facility], [address] [place], [place] [address]   TSH 02/11/2023 3.115  0.350 - 4.500 uIU/mL Final   Comment: Performed by a 3rd Generation assay with a functional sensitivity of <=0.01 uIU/mL. Performed at [facility], [address], [place], [place] [address]    SARS Coronavirus 2 by RT PCR 02/11/2023 NEGATIVE  NEGATIVE Final   Influenza A by PCR 02/11/2023 NEGATIVE  NEGATIVE Final   Influenza B by PCR 02/11/2023 NEGATIVE  NEGATIVE Final   Comment: (NOTE) The Xpert Xpress SARS-CoV-2/FLU/RSV plus assay is intended as an aid in the diagnosis of influenza from Nasopharyngeal swab specimens and should not be used as a sole basis for treatment. Nasal washings and aspirates are unacceptable for Xpert Xpress SARS-CoV-2/FLU/RSV testing.  Fact Sheet for Patients: BloggerCourse.com  Fact Sheet for Healthcare Providers: SeriousBroker.it  This test is not yet approved or cleared by the [place] FDA and has been authorized for detection and/or diagnosis of SARS-CoV-2 by FDA under an Emergency Use Authorization (EUA). This EUA will remain in effect (meaning this test can be used) for the duration of the COVID-19 declaration under Section 564(b)(1) of the Act, 21 U.S.C. section 360bbb-3(b)(1), unless the authorization is terminated or revoked.     Resp Syncytial Virus by PCR 02/11/2023 NEGATIVE  NEGATIVE Final    Comment: (NOTE) Fact Sheet for Patients: BloggerCourse.com  Fact Sheet for Healthcare Providers: SeriousBroker.it  This test is not yet approved or cleared by the [place] FDA and has been authorized for detection and/or diagnosis of SARS-CoV-2 by FDA under an Emergency Use Authorization (EUA). This EUA will remain in effect (meaning this test can be used) for the duration of the COVID-19 declaration under Section 564(b)(1) of the Act, 21 U.S.C. section 360bbb-3(b)(1), unless the authorization is terminated or revoked.  Performed at [facility], [address], [place], [place] [address]    Potassium 02/14/2023 4.2  3.5 - 5.1 mmol/L Final   Performed at [facility], [address], [place], [place] [address]  Admission on 11/06/2022, Discharged on 11/06/2022  Component Date Value Ref Range Status   WBC 11/06/2022 7.0  4.0 - 10.5 K/uL Final   RBC 11/06/2022 4.23  3.87 - 5.11 MIL/uL Final   Hemoglobin 11/06/2022 13.9  12.0 - 15.0 g/dL Final   HCT 10/03/2535 39.7  36.0 - 46.0 % Final   MCV 11/06/2022 93.9  80.0 - 100.0 fL Final   MCH 11/06/2022 32.9  26.0 - 34.0 pg Final   MCHC 11/06/2022 35.0  30.0 - 36.0 g/dL Final   RDW 64/40/3474 12.6  11.5 - 15.5 % Final   Platelets 11/06/2022 314  150 - 400 K/uL Final   nRBC 11/06/2022 0.0  0.0 - 0.2 % Final   Neutrophils Relative % 11/06/2022 64  % Final   Neutro Abs 11/06/2022 4.5  1.7 - 7.7 K/uL Final   Lymphocytes Relative 11/06/2022 25  % Final   Lymphs Abs 11/06/2022 1.8  0.7 - 4.0 K/uL Final   Monocytes Relative 11/06/2022 7  % Final   Monocytes Absolute 11/06/2022 0.5  0.1 - 1.0 K/uL Final  Eosinophils Relative 11/06/2022 1  % Final   Eosinophils Absolute 11/06/2022 0.1  0.0 - 0.5 K/uL Final   Basophils Relative 11/06/2022 2  % Final   Basophils Absolute 11/06/2022 0.1  0.0 - 0.1 K/uL Final   Immature Granulocytes 11/06/2022 1  % Final   Abs Immature  Granulocytes 11/06/2022 0.04  0.00 - 0.07 K/uL Final   Performed at [facility], [address], [place], [place] [address]   Sodium 11/06/2022 137  135 - 145 mmol/L Final   Potassium 11/06/2022 3.0 (L)  3.5 - 5.1 mmol/L Final   Chloride 11/06/2022 100  98 - 111 mmol/L Final   CO2 11/06/2022 21 (L)  22 - 32 mmol/L Final   Glucose, Bld 11/06/2022 117 (H)  70 - 99 mg/dL Final   Glucose reference range applies only to samples taken after fasting for at least 8 hours.   BUN 11/06/2022 13  8 - 23 mg/dL Final   Creatinine, Ser 11/06/2022 0.48  0.44 - 1.00 mg/dL Final   Calcium 64/33/2951 9.4  8.9 - 10.3 mg/dL Final   Total Protein 88/41/6606 7.6  6.5 - 8.1 g/dL Final   Albumin 30/16/0109 4.3  3.5 - 5.0 g/dL Final   AST 32/35/5732 45 (H)  15 - 41 U/L Final   ALT 11/06/2022 38  0 - 44 U/L Final   Alkaline Phosphatase 11/06/2022 74  38 - 126 U/L Final   Total Bilirubin 11/06/2022 1.6 (H)  0.3 - 1.2 mg/dL Final   GFR, Estimated 11/06/2022 >60  >60 mL/min Final   Comment: (NOTE) Calculated using the CKD-EPI Creatinine Equation (2021)    Anion gap 11/06/2022 16 (H)  5 - 15 Final   Performed at [facility], [address], [place], [place] [address]   Alcohol, Ethyl (B) 11/06/2022 54 (H)  <10 mg/dL Final   Comment: (NOTE) Lowest detectable limit for serum alcohol is 10 mg/dL.  For medical purposes only. Performed at [facility], [address], [place], [place] [address]     Allergies: Patient has no known allergies.  Medications:  Facility Ordered Medications  Medication   acetaminophen (TYLENOL) tablet 650 mg   alum & mag hydroxide-simeth (MAALOX/MYLANTA) 200-200-20 MG/5ML suspension 30 mL   magnesium hydroxide (MILK OF MAGNESIA) suspension 30 mL   amLODipine (NORVASC) tablet 10 mg   hydrOXYzine (VISTARIL) capsule 25 mg   traZODone (DESYREL) tablet 100 mg   multivitamin with minerals tablet 1 tablet   chlordiazePOXIDE  (LIBRIUM) capsule 25 mg   loperamide (IMODIUM) capsule 2-4 mg   ondansetron (ZOFRAN-ODT) disintegrating tablet 4 mg   losartan (COZAAR) tablet 100 mg   And   hydrochlorothiazide (HYDRODIURIL) tablet 12.5 mg   thiamine (VITAMIN B1) tablet 500 mg   busPIRone (BUSPAR) tablet 5 mg   sertraline (ZOLOFT) tablet 100 mg   chlordiazePOXIDE (LIBRIUM) capsule 25 mg   Followed by   Melene Muller ON 03/20/2023] chlordiazePOXIDE (LIBRIUM) capsule 25 mg   Followed by   Melene Muller ON 03/21/2023] chlordiazePOXIDE (LIBRIUM) capsule 25 mg   Followed by   Melene Muller ON 03/22/2023] chlordiazePOXIDE (LIBRIUM) capsule 25 mg   cyanocobalamin (VITAMIN B12) tablet 1,000 mcg   PTA Medications  Medication Sig   losartan-hydrochlorothiazide (HYZAAR) 100-12.5 MG tablet Take 1 tablet by mouth daily.   traZODone (DESYREL) 100 MG tablet Take 100 mg by mouth at bedtime.   sertraline (ZOLOFT) 100 MG tablet Take 2 tablets (200 mg total) by mouth daily. (Patient taking differently: Take  200 mg by mouth at bedtime.)   buPROPion (WELLBUTRIN XL) 150 MG 24 hr tablet Take 150 mg by mouth every morning.   naltrexone (DEPADE) 50 MG tablet Take 50 mg by mouth daily.   ondansetron (ZOFRAN) 4 MG tablet Take 4 mg by mouth every 8 (eight) hours as needed for nausea or vomiting.   busPIRone (BUSPAR) 5 MG tablet Take 5 mg by mouth 2 (two) times daily.    Long Term Goals: Improvement in symptoms so as ready for discharge  Short Term Goals: Patient will verbalize feelings in meetings with treatment team members., Patient will attend at least of 50% of the groups daily., Pt will complete the PHQ9 on admission, day 3 and discharge., Patient will participate in completing the [place] Suicide Severity Rating Scale, Patient will score a low risk of violence for 24 hours prior to discharge, and Patient will take medications as prescribed daily.  Medical Decision Making  Patient remains voluntary.  She will be admitted to facility based crisis unit for  treatment and stabilization.   Laboratory studies ordered including CBC, CMP, ethanol, magnesium and vitamin B12 level.  Urinalysis and urine drug screen ordered.  EKG order initiated.  Current medications: -Acetaminophen 650 mg every 6 as needed/mild pain -Maalox 30 mL oral every 4 as needed/digestion -Magnesium hydroxide 30 mL daily as needed/mild constipation  Initiated home medications: -Amlodipine 10 mg daily -Buspirone 5 mg twice daily -Cyanocobalamin 1000 mcg daily -Hydroxyzine 25 mg twice daily as needed/anxiety -Losartan 100 mg daily -Hydrochlorothiazide 12.25 mg daily -Sertraline 100 mg daily -Trazodone 100 mg nightly  CIWA Librium protocol initiated: -Loperamide 2 to 4 mg oral as needed/diarrhea or loose stools - chlordiazepoxide (Librium) 25 mg 4 times daily x4 doses, 25 mg 3 times daily x3 doses,25 mg 2 times daily x2 doses, 25 mg daily x1 dose -chlordiazepoxide (LIbrium) 25 mg every 6 hours as needed CIWA greater than 10 -Multivitamin with minerals 1 tablet daily -Ondansetron disintegrating tablet 4 mg every 6 as needed/nausea or vomiting -Thiamine injection 100 mg IM once -Thiamine tablet 500 mg Q8 hours  Recommendations  Based on my evaluation the patient does not appear to have an emergency medical condition.  Lenard Lance, FNP 03/19/23  5:26 PM

## 2023-03-19 NOTE — ED Notes (Signed)
Patient was provided with dinner 

## 2023-03-19 NOTE — ED Notes (Signed)
Pt admitted to [facility]bc denies  SI/HI/AVH. Calm, cooperative throughout interview process. Skin assessment completed. Oriented to unit. Meal and drink offered. At currrent, pt continue to denies SI/HI/AVH. Pt verbally contract for safety. Will monitor for safety. 

## 2023-03-19 NOTE — ED Notes (Signed)
Patient in room. Environment is secured. Will continue to monitor for safety. 

## 2023-03-20 DIAGNOSIS — F1090 Alcohol use, unspecified, uncomplicated: Secondary | ICD-10-CM | POA: Diagnosis not present

## 2023-03-20 DIAGNOSIS — F332 Major depressive disorder, recurrent severe without psychotic features: Secondary | ICD-10-CM | POA: Diagnosis not present

## 2023-03-20 MED ORDER — POTASSIUM CHLORIDE CRYS ER 20 MEQ PO TBCR
40.0000 meq | EXTENDED_RELEASE_TABLET | Freq: Two times a day (BID) | ORAL | Status: AC
  Administered 2023-03-20 (×2): 40 meq via ORAL
  Filled 2023-03-20 (×2): qty 2

## 2023-03-20 MED ORDER — SACCHAROMYCES BOULARDII 250 MG PO CAPS
250.0000 mg | ORAL_CAPSULE | Freq: Every day | ORAL | Status: DC
  Administered 2023-03-20 – 2023-03-26 (×7): 250 mg via ORAL
  Filled 2023-03-20 (×7): qty 1

## 2023-03-20 MED ORDER — CEPHALEXIN 250 MG PO CAPS
500.0000 mg | ORAL_CAPSULE | Freq: Two times a day (BID) | ORAL | Status: AC
  Administered 2023-03-20 – 2023-03-25 (×10): 500 mg via ORAL
  Filled 2023-03-20 (×10): qty 2

## 2023-03-20 NOTE — ED Provider Notes (Signed)
Behavioral Health Progress Note  Date and Time: 03/20/2023 2:40 PM Name: Stephanie Riley MRN:  [id number]  Subjective:  Stephanie Riley is a 72 yo patient with a PPH of MDD, Etoh use disorder, self-reported ADHD, anxiety, and chronic prescribed BZD use.  Current regimen:   Zoloft 100 mg daily BuSpar 5 mg twice daily (started/10/2023) Trazodone 100-150 mg nightly Librium Taper   She presented to [facility] and then [facility] at the request of, provider for Etoh detox in the setting of hx complicated withdrawal from BZDs.   On assessment today patient endorses feeling better about coming to [facility] and less depressed. She reports that she believes being around other people has positively impacted her mood and she is now considering residential rehab instead of returning home once her taper completes. She endorses still being preoccupied with her husband separating from her and wants to talk to him about how guilty he should feel for "leaving" their marriage. She is also still very upset about the intervention endorsing "I wasn't even drinking that much back then."   She reports she slept very well last night but wet the bed. She reports that this was because she was too tired to get up and was sleeping so well. However, she also reports that she has been having diarrhea. She reports she had a large amount this AM. She reports that her appetite has already drastically improved since admission. She also denies SI, HI, and AVH.   Diagnosis:  Final diagnoses:  Admits to alcohol use    Total Time spent with patient: 20 minutes  Past Psychiatric History: OPT: Hx of Adderall, Zoloft, Trazodone, Wellbutrin, Xanax, Klonopin, no psychiatry opt 12/2022- INPT at [facility] , patient noted to have yellow/orange sclera on arrival, but cleared by discharge. Dx with Etoh use d/o,. MOCA was done due to cognitive concerns, scored 23/30. 2nd hosp in 2024- [facility], BZD withdrawal Dx: MDD, Etoh use disorder, ADHD, anxiety,  and chronic prescribed BZD use. Therapy: marriage counseling Past Medical History: HTN Family History: Both parents had Alzhmier's  Family Psychiatric  History: na Social History: -Living alone, separated from husband -Daughter in [place] - was a Runner, broadcasting/film/video - enjoyed working out at least 1 year ago  Additional Social History:    Pain Medications: See [facility] Prescriptions: See MAR Over the Counter: See MAR History of alcohol / drug use?: Yes Longest period of sobriety (when/how long): Unknown Negative Consequences of Use: Personal relationships Withdrawal Symptoms: Agitation, Irritability Name of Substance 1: Alcohol 1 - Age of First Use: 17 1 - Amount (size/oz): Pt reports amounts vary from 1 to 3 glasses of wine daily 1 - Frequency: Daily 1 - Duration: Ongoing 1 - Last Use / Amount: Last 24 hours patient reports "a couple glasses of wine." 1 - Method of Aquiring: Legally 1- Route of Use: Oral                  Sleep: Good  Appetite:  Good  Current Medications:  Current Facility-Administered Medications  Medication Dose Route Frequency Provider Last Rate Last Admin   acetaminophen (TYLENOL) tablet 650 mg  650 mg Oral Q6H PRN Lenard Lance, FNP       alum & mag hydroxide-simeth (MAALOX/MYLANTA) 200-200-20 MG/5ML suspension 30 mL  30 mL Oral Q4H PRN Lenard Lance, FNP       amLODipine (NORVASC) tablet 10 mg  10 mg Oral Daily Lenard Lance, FNP   10 mg at 03/20/23 [phone]  busPIRone (BUSPAR) tablet 5 mg  5 mg Oral BID Kizzie Ide B, MD   5 mg at 03/20/23 0913   chlordiazePOXIDE (LIBRIUM) capsule 25 mg  25 mg Oral Q6H PRN Lenard Lance, FNP       chlordiazePOXIDE (LIBRIUM) capsule 25 mg  25 mg Oral TID Kizzie Ide B, MD   25 mg at 03/20/23 0913   Followed by   Melene Muller ON 03/21/2023] chlordiazePOXIDE (LIBRIUM) capsule 25 mg  25 mg Oral Wilber Bihari B, MD       Followed by   Melene Muller ON 03/22/2023] chlordiazePOXIDE (LIBRIUM) capsule 25 mg  25 mg Oral Daily Kizzie Ide  B, MD       cyanocobalamin (VITAMIN B12) tablet 1,000 mcg  1,000 mcg Oral Daily Kizzie Ide B, MD   1,000 mcg at 03/20/23 0913   losartan (COZAAR) tablet 100 mg  100 mg Oral Daily Nelly Rout, MD   100 mg at 03/20/23 6962   And   hydrochlorothiazide (HYDRODIURIL) tablet 12.5 mg  12.5 mg Oral Daily Nelly Rout, MD   12.5 mg at 03/20/23 0913   hydrOXYzine (VISTARIL) capsule 25 mg  25 mg Oral BID PRN Lenard Lance, FNP       loperamide (IMODIUM) capsule 2-4 mg  2-4 mg Oral PRN Lenard Lance, FNP   2 mg at 03/19/23 1850   magnesium hydroxide (MILK OF MAGNESIA) suspension 30 mL  30 mL Oral Daily PRN Lenard Lance, FNP       multivitamin with minerals tablet 1 tablet  1 tablet Oral Daily Lenard Lance, FNP   1 tablet at 03/20/23 0913   ondansetron (ZOFRAN-ODT) disintegrating tablet 4 mg  4 mg Oral Q6H PRN Lenard Lance, FNP       potassium chloride SA (KLOR-CON M) CR tablet 40 mEq  40 mEq Oral BID Eliseo Gum B, MD   40 mEq at 03/20/23 1031   sertraline (ZOLOFT) tablet 100 mg  100 mg Oral Daily Kizzie Ide B, MD   100 mg at 03/20/23 0913   thiamine (VITAMIN B1) tablet 500 mg  500 mg Oral Q8H Kizzie Ide B, MD   500 mg at 03/20/23 1313   traZODone (DESYREL) tablet 100 mg  100 mg Oral QHS Lenard Lance, FNP   100 mg at 03/19/23 2127   Current Outpatient Medications  Medication Sig Dispense Refill   buPROPion (WELLBUTRIN XL) 150 MG 24 hr tablet Take 150 mg by mouth every morning.     busPIRone (BUSPAR) 5 MG tablet Take 5 mg by mouth 2 (two) times daily.     Cyanocobalamin (VITAMIN B-12 PO) Take 1 tablet by mouth daily.     hydrOXYzine (VISTARIL) 25 MG capsule Take 1 capsule (25 mg total) by mouth 2 (two) times daily as needed for anxiety. 14 capsule 0   losartan-hydrochlorothiazide (HYZAAR) 100-12.5 MG tablet Take 1 tablet by mouth daily.     naltrexone (DEPADE) 50 MG tablet Take 50 mg by mouth daily.     ondansetron (ZOFRAN) 4 MG tablet Take 4 mg by mouth every 8 (eight) hours as  needed for nausea or vomiting.     sertraline (ZOLOFT) 100 MG tablet Take 2 tablets (200 mg total) by mouth daily. (Patient taking differently: Take 200 mg by mouth at bedtime.) 60 tablet 0   traZODone (DESYREL) 100 MG tablet Take 100 mg by mouth at bedtime.      Labs  Lab Results:  Admission on 03/19/2023  Component Date Value Ref Range Status   SARS Coronavirus 2 by RT PCR 03/19/2023 NEGATIVE  NEGATIVE Final   Performed at [facility], [address], [place], [place] [address]   WBC 03/19/2023 9.3  4.0 - 10.5 K/uL Final   RBC 03/19/2023 3.87  3.87 - 5.11 MIL/uL Final   Hemoglobin 03/19/2023 11.8 (L)  12.0 - 15.0 g/dL Final   HCT 60/45/4098 35.3 (L)  36.0 - 46.0 % Final   MCV 03/19/2023 91.2  80.0 - 100.0 fL Final   MCH 03/19/2023 30.5  26.0 - 34.0 pg Final   MCHC 03/19/2023 33.4  30.0 - 36.0 g/dL Final   RDW 11/91/4782 15.4  11.5 - 15.5 % Final   Platelets 03/19/2023 181  150 - 400 K/uL Final   nRBC 03/19/2023 0.0  0.0 - 0.2 % Final   Neutrophils Relative % 03/19/2023 73  % Final   Neutro Abs 03/19/2023 6.8  1.7 - 7.7 K/uL Final   Lymphocytes Relative 03/19/2023 15  % Final   Lymphs Abs 03/19/2023 1.4  0.7 - 4.0 K/uL Final   Monocytes Relative 03/19/2023 10  % Final   Monocytes Absolute 03/19/2023 0.9  0.1 - 1.0 K/uL Final   Eosinophils Relative 03/19/2023 0  % Final   Eosinophils Absolute 03/19/2023 0.0  0.0 - 0.5 K/uL Final   Basophils Relative 03/19/2023 1  % Final   Basophils Absolute 03/19/2023 0.1  0.0 - 0.1 K/uL Final   Immature Granulocytes 03/19/2023 1  % Final   Abs Immature Granulocytes 03/19/2023 0.06  0.00 - 0.07 K/uL Final   Performed at [facility], [address], [place], [place] [address]   Sodium 03/19/2023 132 (L)  135 - 145 mmol/L Final   Potassium 03/19/2023 2.8 (L)  3.5 - 5.1 mmol/L Final   Chloride 03/19/2023 95 (L)  98 - 111 mmol/L Final   CO2 03/19/2023 24  22 - 32 mmol/L Final   Glucose, Bld 03/19/2023 97  70 - 99 mg/dL Final    Glucose reference range applies only to samples taken after fasting for at least 8 hours.   BUN 03/19/2023 21  8 - 23 mg/dL Final   Creatinine, Ser 03/19/2023 0.95  0.44 - 1.00 mg/dL Final   Calcium 30/86/5784 9.0  8.9 - 10.3 mg/dL Final   Total Protein 69/62/9528 7.1  6.5 - 8.1 g/dL Final   Albumin 41/32/4401 4.5  3.5 - 5.0 g/dL Final   AST 02/72/5366 37  15 - 41 U/L Final   ALT 03/19/2023 29  0 - 44 U/L Final   Alkaline Phosphatase 03/19/2023 81  38 - 126 U/L Final   Total Bilirubin 03/19/2023 2.9 (H)  0.3 - 1.2 mg/dL Final   GFR, Estimated 03/19/2023 >60  >60 mL/min Final   Comment: (NOTE) Calculated using the CKD-EPI Creatinine Equation (2021)    Anion gap 03/19/2023 13  5 - 15 Final   Performed at [facility], [address], [place], [place] [address]   Alcohol, Ethyl (B) 03/19/2023 <10  <10 mg/dL Final   Comment: (NOTE) Lowest detectable limit for serum alcohol is 10 mg/dL.  For medical purposes only. Performed at [facility], [address], [place], [place] [address]    Color, Urine 03/19/2023 AMBER (A)  YELLOW Final   BIOCHEMICALS MAY BE AFFECTED BY COLOR   APPearance 03/19/2023 CLOUDY (A)  CLEAR Final   Specific Gravity, Urine 03/19/2023 1.025  1.005 - 1.030 Final  pH 03/19/2023 5.0  5.0 - 8.0 Final   Glucose, UA 03/19/2023 NEGATIVE  NEGATIVE mg/dL Final   Hgb urine dipstick 03/19/2023 NEGATIVE  NEGATIVE Final   Bilirubin Urine 03/19/2023 MODERATE (A)  NEGATIVE Final   Ketones, ur 03/19/2023 5 (A)  NEGATIVE mg/dL Final   Protein, ur 16/09/9603 100 (A)  NEGATIVE mg/dL Final   Nitrite 54/08/8118 NEGATIVE  NEGATIVE Final   Leukocytes,Ua 03/19/2023 MODERATE (A)  NEGATIVE Final   RBC / HPF 03/19/2023 0-5  0 - 5 RBC/hpf Final   WBC, UA 03/19/2023 21-50  0 - 5 WBC/hpf Final   Bacteria, UA 03/19/2023 MANY (A)  NONE SEEN Final   Squamous Epithelial / HPF 03/19/2023 6-10  0 - 5 /HPF Final   Mucus 03/19/2023 PRESENT   Final   Hyaline Casts, UA 03/19/2023  PRESENT   Final   Non Squamous Epithelial 03/19/2023 0-5 (A)  NONE SEEN Final   Performed at [facility], [address], [place], [place] [address]   Magnesium 03/19/2023 2.2  1.7 - 2.4 mg/dL Final   Performed at [facility], [address], [place], [place] [address]   Vitamin B-12 03/19/2023 398  180 - 914 pg/mL Final   Comment: (NOTE) This assay is not validated for testing neonatal or myeloproliferative syndrome specimens for Vitamin B12 levels. Performed at [facility], [address] [place], [place] [address]    SARSCOV2ONAVIRUS 2 AG 03/19/2023 NEGATIVE  NEGATIVE Final   Comment: (NOTE) SARS-CoV-2 antigen NOT DETECTED.   Negative results are presumptive.  Negative results do not preclude SARS-CoV-2 infection and should not be used as the sole basis for treatment or other patient management decisions, including infection  control decisions, particularly in the presence of clinical signs and  symptoms consistent with COVID-19, or in those who have been in contact with the virus.  Negative results must be combined with clinical observations, patient history, and epidemiological information. The expected result is Negative.  Fact Sheet for Patients: https://www.jennings-kim.com/  Fact Sheet for Healthcare Providers: https://alexander-rogers.biz/  This test is not yet approved or cleared by the [place] FDA and  has been authorized for detection and/or diagnosis of SARS-CoV-2 by FDA under an Emergency Use Authorization (EUA).  This EUA will remain in effect (meaning this test can be used) for the duration of  the COV                          ID-19 declaration under Section 564(b)(1) of the Act, 21 U.S.C. section 360bbb-3(b)(1), unless the authorization is terminated or revoked sooner.    Admission on 02/11/2023, Discharged on 02/17/2023  Component Date Value Ref Range Status   Alcohol, Ethyl (B) 02/11/2023 72 (H)  <10  mg/dL Final   Comment: (NOTE) Lowest detectable limit for serum alcohol is 10 mg/dL.  For medical purposes only. Performed at [facility], [address] [place], [place] [address]    Sodium 02/11/2023 136  135 - 145 mmol/L Final   Potassium 02/11/2023 3.2 (L)  3.5 - 5.1 mmol/L Final   Chloride 02/11/2023 97 (L)  98 - 111 mmol/L Final   CO2 02/11/2023 24  22 - 32 mmol/L Final   Glucose, Bld 02/11/2023 97  70 - 99 mg/dL Final   Glucose reference range applies only to samples taken after fasting for at least 8 hours.   BUN 02/11/2023 7 (L)  8 - 23 mg/dL Final  Creatinine, Ser 02/11/2023 0.47  0.44 - 1.00 mg/dL Final   Calcium 40/98/1191 9.2  8.9 - 10.3 mg/dL Final   Total Protein 47/82/9562 7.7  6.5 - 8.1 g/dL Final   Albumin 13/07/6577 4.7  3.5 - 5.0 g/dL Final   AST 46/96/2952 35  15 - 41 U/L Final   ALT 02/11/2023 34  0 - 44 U/L Final   Alkaline Phosphatase 02/11/2023 91  38 - 126 U/L Final   Total Bilirubin 02/11/2023 1.7 (H)  0.3 - 1.2 mg/dL Final   GFR, Estimated 02/11/2023 >60  >60 mL/min Final   Comment: (NOTE) Calculated using the CKD-EPI Creatinine Equation (2021)    Anion gap 02/11/2023 15  5 - 15 Final   Performed at [facility], [address], [place], [place] [address]   WBC 02/11/2023 9.2  4.0 - 10.5 K/uL Final   RBC 02/11/2023 4.23  3.87 - 5.11 MIL/uL Final   Hemoglobin 02/11/2023 13.4  12.0 - 15.0 g/dL Final   HCT 44/12/270 38.8  36.0 - 46.0 % Final   MCV 02/11/2023 91.7  80.0 - 100.0 fL Final   MCH 02/11/2023 31.7  26.0 - 34.0 pg Final   MCHC 02/11/2023 34.5  30.0 - 36.0 g/dL Final   RDW 53/66/4403 13.0  11.5 - 15.5 % Final   Platelets 02/11/2023 422 (H)  150 - 400 K/uL Final   nRBC 02/11/2023 0.0  0.0 - 0.2 % Final   Performed at [facility], [address], [place], [place] [address]   Magnesium 02/11/2023 2.0  1.7 - 2.4 mg/dL Final   Performed at [facility], [address] [place], [place] [address]   Phosphorus 02/11/2023  3.7  2.5 - 4.6 mg/dL Final   Performed at [facility], [address] [place], [place] [address]   Vitamin B-12 02/11/2023 252  180 - 914 pg/mL Final   Comment: (NOTE) This assay is not validated for testing neonatal or myeloproliferative syndrome specimens for Vitamin B12 levels. Performed at [facility], [address], [place], [place] [address]    Folate 02/11/2023 6.3  >5.9 ng/mL Final   Performed at [facility], [address], [place], [place] [address]   TSH 02/11/2023 3.115  0.350 - 4.500 uIU/mL Final   Comment: Performed by a 3rd Generation assay with a functional sensitivity of <=0.01 uIU/mL. Performed at [facility], [address], [place], [place] [address]    SARS Coronavirus 2 by RT PCR 02/11/2023 NEGATIVE  NEGATIVE Final   Influenza A by PCR 02/11/2023 NEGATIVE  NEGATIVE Final   Influenza B by PCR 02/11/2023 NEGATIVE  NEGATIVE Final   Comment: (NOTE) The Xpert Xpress SARS-CoV-2/FLU/RSV plus assay is intended as an aid in the diagnosis of influenza from Nasopharyngeal swab specimens and should not be used as a sole basis for treatment. Nasal washings and aspirates are unacceptable for Xpert Xpress SARS-CoV-2/FLU/RSV testing.  Fact Sheet for Patients: BloggerCourse.com  Fact Sheet for Healthcare Providers: SeriousBroker.it  This test is not yet approved or cleared by the [place] FDA and has been authorized for detection and/or diagnosis of SARS-CoV-2 by FDA under an Emergency Use Authorization (EUA). This EUA will remain in effect (meaning this test can be used) for the duration of the COVID-19 declaration under Section 564(b)(1) of the Act, 21 U.S.C. section 360bbb-3(b)(1), unless the authorization is terminated or revoked.     Resp Syncytial Virus by PCR  02/11/2023 NEGATIVE  NEGATIVE Final   Comment: (NOTE) Fact Sheet for  Patients: BloggerCourse.com  Fact Sheet for Healthcare Providers: SeriousBroker.it  This test is not yet approved or cleared by the [place] FDA and has been authorized for detection and/or diagnosis of SARS-CoV-2 by FDA under an Emergency Use Authorization (EUA). This EUA will remain in effect (meaning this test can be used) for the duration of the COVID-19 declaration under Section 564(b)(1) of the Act, 21 U.S.C. section 360bbb-3(b)(1), unless the authorization is terminated or revoked.  Performed at [facility], [address] [place], [place] [address]    Potassium 02/14/2023 4.2  3.5 - 5.1 mmol/L Final   Performed at [facility], [address] [place], [place] [address]  Admission on 11/06/2022, Discharged on 11/06/2022  Component Date Value Ref Range Status   WBC 11/06/2022 7.0  4.0 - 10.5 K/uL Final   RBC 11/06/2022 4.23  3.87 - 5.11 MIL/uL Final   Hemoglobin 11/06/2022 13.9  12.0 - 15.0 g/dL Final   HCT 09/81/1914 39.7  36.0 - 46.0 % Final   MCV 11/06/2022 93.9  80.0 - 100.0 fL Final   MCH 11/06/2022 32.9  26.0 - 34.0 pg Final   MCHC 11/06/2022 35.0  30.0 - 36.0 g/dL Final   RDW 78/29/5621 12.6  11.5 - 15.5 % Final   Platelets 11/06/2022 314  150 - 400 K/uL Final   nRBC 11/06/2022 0.0  0.0 - 0.2 % Final   Neutrophils Relative % 11/06/2022 64  % Final   Neutro Abs 11/06/2022 4.5  1.7 - 7.7 K/uL Final   Lymphocytes Relative 11/06/2022 25  % Final   Lymphs Abs 11/06/2022 1.8  0.7 - 4.0 K/uL Final   Monocytes Relative 11/06/2022 7  % Final   Monocytes Absolute 11/06/2022 0.5  0.1 - 1.0 K/uL Final   Eosinophils Relative 11/06/2022 1  % Final   Eosinophils Absolute 11/06/2022 0.1  0.0 - 0.5 K/uL Final   Basophils Relative 11/06/2022 2  % Final   Basophils Absolute 11/06/2022 0.1  0.0 - 0.1 K/uL Final   Immature Granulocytes 11/06/2022 1  % Final   Abs Immature Granulocytes 11/06/2022 0.04  0.00 - 0.07  K/uL Final   Performed at [facility], [address], [place], [place] [address]   Sodium 11/06/2022 137  135 - 145 mmol/L Final   Potassium 11/06/2022 3.0 (L)  3.5 - 5.1 mmol/L Final   Chloride 11/06/2022 100  98 - 111 mmol/L Final   CO2 11/06/2022 21 (L)  22 - 32 mmol/L Final   Glucose, Bld 11/06/2022 117 (H)  70 - 99 mg/dL Final   Glucose reference range applies only to samples taken after fasting for at least 8 hours.   BUN 11/06/2022 13  8 - 23 mg/dL Final   Creatinine, Ser 11/06/2022 0.48  0.44 - 1.00 mg/dL Final   Calcium 78/46/9629 9.4  8.9 - 10.3 mg/dL Final   Total Protein 52/84/1324 7.6  6.5 - 8.1 g/dL Final   Albumin 40/09/2724 4.3  3.5 - 5.0 g/dL Final   AST 36/64/4034 45 (H)  15 - 41 U/L Final   ALT 11/06/2022 38  0 - 44 U/L Final   Alkaline Phosphatase 11/06/2022 74  38 - 126 U/L Final   Total Bilirubin 11/06/2022 1.6 (H)  0.3 - 1.2 mg/dL Final   GFR, Estimated 11/06/2022 >60  >60 mL/min Final   Comment: (NOTE) Calculated using the CKD-EPI Creatinine Equation (  2021)    Anion gap 11/06/2022 16 (H)  5 - 15 Final   Performed at [facility], [address], [place], [place] [address]   Alcohol, Ethyl (B) 11/06/2022 54 (H)  <10 mg/dL Final   Comment: (NOTE) Lowest detectable limit for serum alcohol is 10 mg/dL.  For medical purposes only. Performed at [facility], [address], [place], [place] [address]     Blood Alcohol level:  Lab Results  Component Value Date   ETH <10 03/19/2023   ETH 72 (H) 02/11/2023    Metabolic Disorder Labs: No results found for: "HGBA1C", "MPG" No results found for: "PROLACTIN" No results found for: "CHOL", "TRIG", "HDL", "CHOLHDL", "VLDL", "LDLCALC"  Therapeutic Lab Levels: No results found for: "LITHIUM" No results found for: "VALPROATE" No results found for: "CBMZ"  Physical Findings   PHQ2-9    Flowsheet Row ED from 03/19/2023 in [facility] ED from 02/11/2023 in [facility]  PHQ-2 Total Score 6 1  PHQ-9 Total Score 16 4      Flowsheet Row ED from 03/19/2023 in [facility] ED from 02/11/2023 in [facility] ED from 11/06/2022 in [facility] Emergency Department at [facility]  C-SSRS RISK CATEGORY No Risk No Risk No Risk        Musculoskeletal  Strength & Muscle Tone: decreased Gait & Station: normal Patient leans: N/A  Psychiatric Specialty Exam  Presentation  General Appearance:  Disheveled  Eye Contact: Good  Speech: Clear and Coherent  Speech Volume: Normal  Handedness: Right   Mood and Affect  Mood: Euthymic  Affect: Appropriate   Thought Process  Thought Processes: Coherent  Descriptions of Associations:Intact  Orientation:Full (Time, Place and Person)  Thought Content:Perseveration  Diagnosis of Schizophrenia or Schizoaffective disorder in past: No    Hallucinations:Hallucinations: None  Ideas of Reference:None  Suicidal Thoughts:Suicidal Thoughts: No  Homicidal Thoughts:Homicidal Thoughts: No   Sensorium  Memory: Immediate Fair  Judgment: -- (IMproving)  Insight: Shallow   Executive Functions  Concentration: Fair  Attention Span: Fair  Recall: Good  Fund of Knowledge: Good  Language: Good   Psychomotor Activity  Psychomotor Activity: Psychomotor Activity: Decreased   Assets  Assets: Desire for Improvement; Housing; Financial Resources/Insurance; Resilience; Social Support   Sleep  Sleep: Sleep: Good   No data recorded  Physical Exam  Physical Exam HENT:     Head: Normocephalic.  Pulmonary:     Effort: Pulmonary effort is normal.  Neurological:     Mental Status: She is alert and oriented to person, place, and time.    Review of Systems  Psychiatric/Behavioral:  Negative for hallucinations and suicidal ideas. The patient does  not have insomnia.    Blood pressure (!) 145/77, pulse 85, temperature 98 F (36.7 C), resp. rate 19, height 5\' 6"  (1.676 m), weight 147 lb (66.7 kg), SpO2 98 %. Body mass index is 23.73 kg/m.  Treatment Plan Summary: Daily contact with patient to assess and evaluate symptoms and progress in treatment and Medication management   Patient does appear more alert today and interacts with peers on unit. She continues to smell of urine and endorses wetting the bed. Her UA is concerning and she endorses a possibility that stool was mixed in due to her diarrhea. However, even if this is the case, patient has been having a lot of urinary incontinence despite having no pain with urination, remain concern  that she now has a UTI. With patient's age would like to treat to avoid further complications that could contribute to AMS.   IN regards to Etoh use d/o patient thus far appears to not be having cravings and although insight appears to be VERY minimal her judgement is improving as she is considering rehab. She has made some connection that she is depressed and this will not help in the quest of total Etoh abstinence, she does believe that being around others and having activities will be helpful. Total abstinence will also better allow for understandings of whether or not patient SSRI is beneficial.   Will need to recheck Na and K on Monday, if NA continues to trend down despite nourishment may have to stop Zoloft; however do not expect this based off of hx it tends to increase with nourishment.   Diarrhea could be 2/2 to self reported microcolitis or Etoh use.HTN could be baseline HTN or Etoh withdrawal, will continue to monitor but will continue amlodpine.    DID reach out to Dr. Madelyn Flavors via consult about possible UTI, he did recommend treating and dx of UTI.  Etoh use d/o Hx of complicated withdrawal from BZDs Hx of BZD delirium MDD GAD - Continue Zoloft 100mg  daily - Continue Buspar 5mg   BID CIWA Librium protocol initiated: -Loperamide 2 to 4 mg oral as needed/diarrhea or loose stools - chlordiazepoxide (Librium) 25 mg 4 times daily x4 doses, 25 mg 3 times daily x3 doses,25 mg 2 times daily x2 doses, 25 mg daily x1 dose -chlordiazepoxide (LIbrium) 25 mg every 6 hours as needed CIWA greater than 10 -Multivitamin with minerals 1 tablet daily -Ondansetron disintegrating tablet 4 mg every 6 as needed/nausea or vomiting -Thiamine injection 100 mg IM once -Thiamine tablet 500 mg Q8 hours  Hypokalmeia Hyponatremia - 40mg  eQ of K+ 2x - Recheck labs Monday  Hyperbilirubinemia - Recheck on Monday, likely 2/2 to Etoh use  HTN - Continue amlodipine 10mg  daily  UTI - Probiotics - keflex 500mg  BID for 5 days  PGY-3 [name], MD 03/20/2023 2:40 PM

## 2023-03-20 NOTE — ED Notes (Signed)
Pt is in the Dayroom eating and watching TV. Respirations are even and unlabored. No acute distress noted. Will continue to monitor for safety.

## 2023-03-20 NOTE — ED Notes (Signed)
Notified pt that breakfast is been served

## 2023-03-20 NOTE — ED Notes (Signed)
Pt is sleeping. No distress noted. Will continue to monitor for safety. 

## 2023-03-20 NOTE — ED Notes (Signed)
Pt is in the bed sleeping. Respirations are even and unlabored. No acute distress noted. Will continue to monitor for safety. 

## 2023-03-20 NOTE — Progress Notes (Signed)
Pt is awake, alert and oriented X3. No distress noted or concerns voiced. Pt denies pain and current SI/HI/AVH, plan or intent. Received scheduled meds with no issue. Staff will monitor for pt's safety.

## 2023-03-21 DIAGNOSIS — F1090 Alcohol use, unspecified, uncomplicated: Secondary | ICD-10-CM | POA: Diagnosis not present

## 2023-03-21 DIAGNOSIS — F332 Major depressive disorder, recurrent severe without psychotic features: Secondary | ICD-10-CM | POA: Diagnosis not present

## 2023-03-21 LAB — POCT URINE DRUG SCREEN - MANUAL ENTRY (I-SCREEN)
POC Amphetamine UR: NOT DETECTED
POC Buprenorphine (BUP): NOT DETECTED
POC Cocaine UR: NOT DETECTED
POC Marijuana UR: NOT DETECTED
POC Methadone UR: NOT DETECTED
POC Methamphetamine UR: NOT DETECTED
POC Morphine: NOT DETECTED
POC Oxazepam (BZO): POSITIVE — AB
POC Oxycodone UR: NOT DETECTED
POC Secobarbital (BAR): NOT DETECTED

## 2023-03-21 MED ORDER — THIAMINE MONONITRATE 100 MG PO TABS
250.0000 mg | ORAL_TABLET | Freq: Every day | ORAL | Status: DC
  Administered 2023-03-22 – 2023-03-26 (×5): 250 mg via ORAL
  Filled 2023-03-21 (×6): qty 3

## 2023-03-21 MED ORDER — THIAMINE HCL 100 MG PO TABS
500.0000 mg | ORAL_TABLET | Freq: Three times a day (TID) | ORAL | Status: AC
  Administered 2023-03-21 (×2): 500 mg via ORAL
  Filled 2023-03-21 (×2): qty 5

## 2023-03-21 NOTE — ED Notes (Signed)
Pt is in his room resting in bed. Pt complained of tiredness and being bored. Writer suggested she goes to the dayroom to spend sometime with peers or read a book. Pt denies SI/HI/AVH. No acute distress noted. Will continue to monitor for safety.

## 2023-03-21 NOTE — ED Notes (Signed)
Pt sleeping in no acute distress. RR even and unlabored. Environment secured. Will continue to monitor for safety. 

## 2023-03-21 NOTE — ED Notes (Signed)
Patient was provided with lunch 

## 2023-03-21 NOTE — ED Notes (Signed)
Patient is sleeping. Respirations equal and unlabored, skin warm and dry. No change in assessment or acuity. Routine safety checks conducted according to facility protocol. Will continue to monitor for safety.   

## 2023-03-21 NOTE — Group Note (Signed)
Group Topic: Decisional Balance/Substance Abuse  Group Date: 03/21/2023 Start Time: 1515 End Time: 1610 Facilitators: Prentice Docker, RN  Department: [facility]  Number of Participants: 7  Group Focus: clarity of thought, daily focus, family, feeling awareness/expression, goals/reality orientation, personal responsibility, and self-awareness Treatment Modality:  Psychoeducation Interventions utilized were clarification and problem solving Purpose: enhance coping skills, improve communication skills, increase insight, regain self-worth, and relapse prevention strategies  Name: Stephanie Riley Date of Birth: 06/10/1951  MR: [id number]    Level of Participation: active Quality of Participation: attentive and engaged Interactions with others: gave feedback Mood/Affect: appropriate and positive Triggers (if applicable): n/a Cognition: coherent/clear, insightful, and logical Progress: Gaining insight Response: I'm just tired of always waking up sick. I'm not getting any younger. It's time to grow up" Plan: patient will be encouraged to attend future groups  Patients Problems:  Patient Active Problem List   Diagnosis Date Noted   Alcohol use disorder 03/19/2023   Alcohol use disorder, moderate, dependence 02/12/2023   Benzodiazepine withdrawal without complication 02/12/2023

## 2023-03-21 NOTE — ED Notes (Signed)
Patient was provided with dinner 

## 2023-03-21 NOTE — ED Provider Notes (Signed)
Behavioral Health Progress Note  Date and Time: 03/21/2023 11:35 AM Name: Stephanie Riley MRN:  [id number]  Subjective:  Stephanie Riley is a 72 yo patient with a PPH of MDD, Etoh use disorder, self-reported ADHD, anxiety, and chronic prescribed BZD use.  Current regimen:   Zoloft 100 mg daily BuSpar 5 mg twice daily (started/10/2023) Trazodone 100-150 mg nightly Librium Taper   She presented to [facility] and then [facility] at the request of, provider for Etoh detox in the setting of hx complicated withdrawal from BZDs.   On assessment today patient reports that she again had urinary incontinence overnight. She reports that she still slept very well despite this. She reports that she feels her appetite is ok, but she has not gotten up for breakfast this AM. She reports that she had more diarrhea yesterday and physcially feels very tired today. She endorses that she also feels more down today. She reports that this is because she has been thinking about "my situation." Patient goes on again to talk about how she feels abandoned by her family and that they are in the "wrong." Provider reminded patient that she has more support that most people and that her family has not abandoned her as they are still taking her too appointments and giving her medications to her. She agreed that abandonment may not be the best word. She reports that she is still interested in going to rehab as she feels being alone in her home will only trigger her to feel down and then drink again. She reports that she really needs to be around other people right now and have some stimulation. She reports that she will work to attend more groups here, but looks forward to attending groups when she feels more physically well. She endorses feeling that her balance is improving. She denies HI, SI, and AVH.   Diagnosis:  Final diagnoses:  Admits to alcohol use    Total Time spent with patient: 20 minutes  Past Psychiatric History:  OPT: Hx of  Adderall, Zoloft, Trazodone, Wellbutrin, Xanax, Klonopin, no psychiatry opt 12/2022- INPT at [facility] , patient noted to have yellow/orange sclera on arrival, but cleared by discharge. Dx with Etoh use d/o,. MOCA was done due to cognitive concerns, scored 23/30. 2nd hosp in 2024- [facility], BZD withdrawal Dx: MDD, Etoh use disorder, ADHD, anxiety, and chronic prescribed BZD use. Therapy: marriage counseling Past Medical History: HTN Family History: Both parents had Alzhmier's  Family Psychiatric  History: na Social History: -Living alone, separated from husband -Daughter in [place] - was a Runner, broadcasting/film/video - enjoyed working out at least 1 year ago    Additional Social History:    Pain Medications: See [facility] Prescriptions: See MAR Over the Counter: See MAR History of alcohol / drug use?: Yes Longest period of sobriety (when/how long): Unknown Negative Consequences of Use: Personal relationships Withdrawal Symptoms: Agitation, Irritability Name of Substance 1: Alcohol 1 - Age of First Use: 17 1 - Amount (size/oz): Pt reports amounts vary from 1 to 3 glasses of wine daily 1 - Frequency: Daily 1 - Duration: Ongoing 1 - Last Use / Amount: Last 24 hours patient reports "a couple glasses of wine." 1 - Method of Aquiring: Legally 1- Route of Use: Oral                  Sleep: Good  Appetite:  Fair  Current Medications:  Current Facility-Administered Medications  Medication Dose Route Frequency Provider Last Rate Last Admin  acetaminophen (TYLENOL) tablet 650 mg  650 mg Oral Q6H PRN Lenard Lance, FNP       alum & mag hydroxide-simeth (MAALOX/MYLANTA) 200-200-20 MG/5ML suspension 30 mL  30 mL Oral Q4H PRN Lenard Lance, FNP       amLODipine (NORVASC) tablet 10 mg  10 mg Oral Daily Lenard Lance, FNP   10 mg at 03/21/23 0952   busPIRone (BUSPAR) tablet 5 mg  5 mg Oral BID Kizzie Ide B, MD   5 mg at 03/21/23 0952   cephALEXin (KEFLEX) capsule 500 mg  500 mg Oral Q12H Eliseo Gum B,  MD   500 mg at 03/21/23 1610   chlordiazePOXIDE (LIBRIUM) capsule 25 mg  25 mg Oral Q6H PRN Lenard Lance, FNP       chlordiazePOXIDE (LIBRIUM) capsule 25 mg  25 mg Oral Wilber Bihari B, MD   25 mg at 03/21/23 9604   Followed by   Melene Muller ON 03/22/2023] chlordiazePOXIDE (LIBRIUM) capsule 25 mg  25 mg Oral Daily Kizzie Ide B, MD       cyanocobalamin (VITAMIN B12) tablet 1,000 mcg  1,000 mcg Oral Daily Kizzie Ide B, MD   1,000 mcg at 03/21/23 5409   losartan (COZAAR) tablet 100 mg  100 mg Oral Daily Nelly Rout, MD   100 mg at 03/21/23 8119   And   hydrochlorothiazide (HYDRODIURIL) tablet 12.5 mg  12.5 mg Oral Daily Nelly Rout, MD   12.5 mg at 03/21/23 1478   hydrOXYzine (VISTARIL) capsule 25 mg  25 mg Oral BID PRN Lenard Lance, FNP       loperamide (IMODIUM) capsule 2-4 mg  2-4 mg Oral PRN Lenard Lance, FNP   2 mg at 03/20/23 1809   magnesium hydroxide (MILK OF MAGNESIA) suspension 30 mL  30 mL Oral Daily PRN Lenard Lance, FNP       multivitamin with minerals tablet 1 tablet  1 tablet Oral Daily Lenard Lance, FNP   1 tablet at 03/21/23 0951   ondansetron (ZOFRAN-ODT) disintegrating tablet 4 mg  4 mg Oral Q6H PRN Lenard Lance, FNP       saccharomyces boulardii (FLORASTOR) capsule 250 mg  250 mg Oral Daily Eliseo Gum B, MD   250 mg at 03/21/23 2956   sertraline (ZOLOFT) tablet 100 mg  100 mg Oral Daily Kizzie Ide B, MD   100 mg at 03/21/23 0952   [START ON 03/22/2023] thiamine (VITAMIN B1) tablet 250 mg  250 mg Oral Daily Eliseo Gum B, MD       thiamine (VITAMIN B1) tablet 500 mg  500 mg Oral Q8H Akili Cuda B, MD       traZODone (DESYREL) tablet 100 mg  100 mg Oral QHS Lenard Lance, FNP   100 mg at 03/20/23 2139   Current Outpatient Medications  Medication Sig Dispense Refill   buPROPion (WELLBUTRIN XL) 150 MG 24 hr tablet Take 150 mg by mouth every morning.     busPIRone (BUSPAR) 5 MG tablet Take 5 mg by mouth 2 (two) times daily.     Cyanocobalamin  (VITAMIN B-12 PO) Take 1 tablet by mouth daily.     hydrOXYzine (VISTARIL) 25 MG capsule Take 1 capsule (25 mg total) by mouth 2 (two) times daily as needed for anxiety. 14 capsule 0   losartan-hydrochlorothiazide (HYZAAR) 100-12.5 MG tablet Take 1 tablet by mouth daily.     naltrexone (DEPADE) 50 MG tablet Take 50 mg by  mouth daily.     ondansetron (ZOFRAN) 4 MG tablet Take 4 mg by mouth every 8 (eight) hours as needed for nausea or vomiting.     sertraline (ZOLOFT) 100 MG tablet Take 2 tablets (200 mg total) by mouth daily. (Patient taking differently: Take 200 mg by mouth at bedtime.) 60 tablet 0   traZODone (DESYREL) 100 MG tablet Take 100 mg by mouth at bedtime.      Labs  Lab Results:  Admission on 03/19/2023  Component Date Value Ref Range Status   SARS Coronavirus 2 by RT PCR 03/19/2023 NEGATIVE  NEGATIVE Final   Performed at [facility], [address] [place], [place] [address]   WBC 03/19/2023 9.3  4.0 - 10.5 K/uL Final   RBC 03/19/2023 3.87  3.87 - 5.11 MIL/uL Final   Hemoglobin 03/19/2023 11.8 (L)  12.0 - 15.0 g/dL Final   HCT 30/13/1438 35.3 (L)  36.0 - 46.0 % Final   MCV 03/19/2023 91.2  80.0 - 100.0 fL Final   MCH 03/19/2023 30.5  26.0 - 34.0 pg Final   MCHC 03/19/2023 33.4  30.0 - 36.0 g/dL Final   RDW 88/75/7972 15.4  11.5 - 15.5 % Final   Platelets 03/19/2023 181  150 - 400 K/uL Final   nRBC 03/19/2023 0.0  0.0 - 0.2 % Final   Neutrophils Relative % 03/19/2023 73  % Final   Neutro Abs 03/19/2023 6.8  1.7 - 7.7 K/uL Final   Lymphocytes Relative 03/19/2023 15  % Final   Lymphs Abs 03/19/2023 1.4  0.7 - 4.0 K/uL Final   Monocytes Relative 03/19/2023 10  % Final   Monocytes Absolute 03/19/2023 0.9  0.1 - 1.0 K/uL Final   Eosinophils Relative 03/19/2023 0  % Final   Eosinophils Absolute 03/19/2023 0.0  0.0 - 0.5 K/uL Final   Basophils Relative 03/19/2023 1  % Final   Basophils Absolute 03/19/2023 0.1  0.0 - 0.1 K/uL Final   Immature Granulocytes 03/19/2023 1   % Final   Abs Immature Granulocytes 03/19/2023 0.06  0.00 - 0.07 K/uL Final   Performed at [facility], [address] [place], [place] [address]   Sodium 03/19/2023 132 (L)  135 - 145 mmol/L Final   Potassium 03/19/2023 2.8 (L)  3.5 - 5.1 mmol/L Final   Chloride 03/19/2023 95 (L)  98 - 111 mmol/L Final   CO2 03/19/2023 24  22 - 32 mmol/L Final   Glucose, Bld 03/19/2023 97  70 - 99 mg/dL Final   Glucose reference range applies only to samples taken after fasting for at least 8 hours.   BUN 03/19/2023 21  8 - 23 mg/dL Final   Creatinine, Ser 03/19/2023 0.95  0.44 - 1.00 mg/dL Final   Calcium 15/61/5379 9.0  8.9 - 10.3 mg/dL Final   Total Protein 43/27/6147 7.1  6.5 - 8.1 g/dL Final   Albumin 09/04/5746 4.5  3.5 - 5.0 g/dL Final   AST 34/02/7095 37  15 - 41 U/L Final   ALT 03/19/2023 29  0 - 44 U/L Final   Alkaline Phosphatase 03/19/2023 81  38 - 126 U/L Final   Total Bilirubin 03/19/2023 2.9 (H)  0.3 - 1.2 mg/dL Final   GFR, Estimated 03/19/2023 >60  >60 mL/min Final   Comment: (NOTE) Calculated using the CKD-EPI Creatinine Equation (2021)    Anion gap 03/19/2023 13  5 - 15 Final   Performed at [facility], [address],  [place], [place] [address]   Alcohol, Ethyl (B) 03/19/2023 <10  <10 mg/dL Final   Comment: (NOTE) Lowest detectable limit for serum alcohol is 10 mg/dL.  For medical purposes only. Performed at [facility], [address] [place], [place] [address]    Color, Urine 03/19/2023 AMBER (A)  YELLOW Final   BIOCHEMICALS MAY BE AFFECTED BY COLOR   APPearance 03/19/2023 CLOUDY (A)  CLEAR Final   Specific Gravity, Urine 03/19/2023 1.025  1.005 - 1.030 Final   pH 03/19/2023 5.0  5.0 - 8.0 Final   Glucose, UA 03/19/2023 NEGATIVE  NEGATIVE mg/dL Final   Hgb urine dipstick 03/19/2023 NEGATIVE  NEGATIVE Final   Bilirubin Urine 03/19/2023 MODERATE (A)  NEGATIVE Final   Ketones, ur 03/19/2023 5 (A)  NEGATIVE mg/dL Final   Protein, ur 09/81/1914 100 (A)   NEGATIVE mg/dL Final   Nitrite 78/29/5621 NEGATIVE  NEGATIVE Final   Leukocytes,Ua 03/19/2023 MODERATE (A)  NEGATIVE Final   RBC / HPF 03/19/2023 0-5  0 - 5 RBC/hpf Final   WBC, UA 03/19/2023 21-50  0 - 5 WBC/hpf Final   Bacteria, UA 03/19/2023 MANY (A)  NONE SEEN Final   Squamous Epithelial / HPF 03/19/2023 6-10  0 - 5 /HPF Final   Mucus 03/19/2023 PRESENT   Final   Hyaline Casts, UA 03/19/2023 PRESENT   Final   Non Squamous Epithelial 03/19/2023 0-5 (A)  NONE SEEN Final   Performed at [facility], [address], [place], [place] [address]   Magnesium 03/19/2023 2.2  1.7 - 2.4 mg/dL Final   Performed at [facility], [address], [place], [place] [address]   Vitamin B-12 03/19/2023 398  180 - 914 pg/mL Final   Comment: (NOTE) This assay is not validated for testing neonatal or myeloproliferative syndrome specimens for Vitamin B12 levels. Performed at [facility], [address] [place], [place] [address]    SARSCOV2ONAVIRUS 2 AG 03/19/2023 NEGATIVE  NEGATIVE Final   Comment: (NOTE) SARS-CoV-2 antigen NOT DETECTED.   Negative results are presumptive.  Negative results do not preclude SARS-CoV-2 infection and should not be used as the sole basis for treatment or other patient management decisions, including infection  control decisions, particularly in the presence of clinical signs and  symptoms consistent with COVID-19, or in those who have been in contact with the virus.  Negative results must be combined with clinical observations, patient history, and epidemiological information. The expected result is Negative.  Fact Sheet for Patients: https://www.jennings-kim.com/  Fact Sheet for Healthcare Providers: https://alexander-rogers.biz/  This test is not yet approved or cleared by the [place] FDA and  has been authorized for detection and/or diagnosis of SARS-CoV-2 by FDA under an Emergency Use Authorization (EUA).  This  EUA will remain in effect (meaning this test can be used) for the duration of  the COV                          ID-19 declaration under Section 564(b)(1) of the Act, 21 U.S.C. section 360bbb-3(b)(1), unless the authorization is terminated or revoked sooner.    Admission on 02/11/2023, Discharged on 02/17/2023  Component Date Value Ref Range Status   Alcohol, Ethyl (B) 02/11/2023 72 (H)  <10 mg/dL Final   Comment: (NOTE) Lowest detectable limit for serum alcohol is 10 mg/dL.  For medical purposes only. Performed at [facility], [address] [place], [place] [address]  Sodium 02/11/2023 136  135 - 145 mmol/L Final   Potassium 02/11/2023 3.2 (L)  3.5 - 5.1 mmol/L Final   Chloride 02/11/2023 97 (L)  98 - 111 mmol/L Final   CO2 02/11/2023 24  22 - 32 mmol/L Final   Glucose, Bld 02/11/2023 97  70 - 99 mg/dL Final   Glucose reference range applies only to samples taken after fasting for at least 8 hours.   BUN 02/11/2023 7 (L)  8 - 23 mg/dL Final   Creatinine, Ser 02/11/2023 0.47  0.44 - 1.00 mg/dL Final   Calcium 16/09/9603 9.2  8.9 - 10.3 mg/dL Final   Total Protein 54/08/8118 7.7  6.5 - 8.1 g/dL Final   Albumin 14/78/2956 4.7  3.5 - 5.0 g/dL Final   AST 21/30/8657 35  15 - 41 U/L Final   ALT 02/11/2023 34  0 - 44 U/L Final   Alkaline Phosphatase 02/11/2023 91  38 - 126 U/L Final   Total Bilirubin 02/11/2023 1.7 (H)  0.3 - 1.2 mg/dL Final   GFR, Estimated 02/11/2023 >60  >60 mL/min Final   Comment: (NOTE) Calculated using the CKD-EPI Creatinine Equation (2021)    Anion gap 02/11/2023 15  5 - 15 Final   Performed at [facility], [address], [place], [place] [address]   WBC 02/11/2023 9.2  4.0 - 10.5 K/uL Final   RBC 02/11/2023 4.23  3.87 - 5.11 MIL/uL Final   Hemoglobin 02/11/2023 13.4  12.0 - 15.0 g/dL Final   HCT 29/52/8413 38.8  36.0 - 46.0 % Final   MCV 02/11/2023 91.7  80.0 - 100.0 fL Final   MCH 02/11/2023 31.7  26.0 - 34.0 pg Final   MCHC  02/11/2023 34.5  30.0 - 36.0 g/dL Final   RDW 24/40/1027 13.0  11.5 - 15.5 % Final   Platelets 02/11/2023 422 (H)  150 - 400 K/uL Final   nRBC 02/11/2023 0.0  0.0 - 0.2 % Final   Performed at [facility], [address] [place], [place] [address]   Magnesium 02/11/2023 2.0  1.7 - 2.4 mg/dL Final   Performed at [facility], [address] [place], [place] [address]   Phosphorus 02/11/2023 3.7  2.5 - 4.6 mg/dL Final   Performed at [facility], [address] [place], [place] [address]   Vitamin B-12 02/11/2023 252  180 - 914 pg/mL Final   Comment: (NOTE) This assay is not validated for testing neonatal or myeloproliferative syndrome specimens for Vitamin B12 levels. Performed at [facility], [address], [place], [place] [address]    Folate 02/11/2023 6.3  >5.9 ng/mL Final   Performed at [facility], [address], [place], [place] [address]   TSH 02/11/2023 3.115  0.350 - 4.500 uIU/mL Final   Comment: Performed by a 3rd Generation assay with a functional sensitivity of <=0.01 uIU/mL. Performed at [facility], [address], [place], [place] [address]    SARS Coronavirus 2 by RT PCR 02/11/2023 NEGATIVE  NEGATIVE Final   Influenza A by PCR 02/11/2023 NEGATIVE  NEGATIVE Final   Influenza B by PCR 02/11/2023 NEGATIVE  NEGATIVE Final   Comment: (NOTE) The Xpert Xpress SARS-CoV-2/FLU/RSV plus assay is intended as an aid in the diagnosis of influenza from Nasopharyngeal swab specimens and should not be used as a sole basis for treatment. Nasal washings and aspirates are unacceptable for Xpert Xpress SARS-CoV-2/FLU/RSV testing.  Fact Sheet  for Patients: BloggerCourse.com  Fact Sheet for Healthcare Providers: SeriousBroker.it  This test is not yet approved or cleared by the [place] FDA and has been authorized for detection and/or diagnosis of SARS-CoV-2 by FDA under an Emergency Use  Authorization (EUA). This EUA will remain in effect (meaning this test can be used) for the duration of the COVID-19 declaration under Section 564(b)(1) of the Act, 21 U.S.C. section 360bbb-3(b)(1), unless the authorization is terminated or revoked.     Resp Syncytial Virus by PCR 02/11/2023 NEGATIVE  NEGATIVE Final   Comment: (NOTE) Fact Sheet for Patients: BloggerCourse.com  Fact Sheet for Healthcare Providers: SeriousBroker.it  This test is not yet approved or cleared by the [place] FDA and has been authorized for detection and/or diagnosis of SARS-CoV-2 by FDA under an Emergency Use Authorization (EUA). This EUA will remain in effect (meaning this test can be used) for the duration of the COVID-19 declaration under Section 564(b)(1) of the Act, 21 U.S.C. section 360bbb-3(b)(1), unless the authorization is terminated or revoked.  Performed at [facility], [address] [place], [place] [address]    Potassium 02/14/2023 4.2  3.5 - 5.1 mmol/L Final   Performed at [facility], [address] [place], [place] [address]  Admission on 11/06/2022, Discharged on 11/06/2022  Component Date Value Ref Range Status   WBC 11/06/2022 7.0  4.0 - 10.5 K/uL Final   RBC 11/06/2022 4.23  3.87 - 5.11 MIL/uL Final   Hemoglobin 11/06/2022 13.9  12.0 - 15.0 g/dL Final   HCT 69/62/9528 39.7  36.0 - 46.0 % Final   MCV 11/06/2022 93.9  80.0 - 100.0 fL Final   MCH 11/06/2022 32.9  26.0 - 34.0 pg Final   MCHC 11/06/2022 35.0  30.0 - 36.0 g/dL Final   RDW 41/32/4401 12.6  11.5 - 15.5 % Final   Platelets 11/06/2022 314  150 - 400 K/uL Final   nRBC 11/06/2022 0.0  0.0 - 0.2 % Final   Neutrophils Relative % 11/06/2022 64  % Final   Neutro Abs 11/06/2022 4.5  1.7 - 7.7 K/uL Final   Lymphocytes Relative 11/06/2022 25  % Final   Lymphs Abs 11/06/2022 1.8  0.7 - 4.0 K/uL Final   Monocytes Relative 11/06/2022 7  % Final   Monocytes  Absolute 11/06/2022 0.5  0.1 - 1.0 K/uL Final   Eosinophils Relative 11/06/2022 1  % Final   Eosinophils Absolute 11/06/2022 0.1  0.0 - 0.5 K/uL Final   Basophils Relative 11/06/2022 2  % Final   Basophils Absolute 11/06/2022 0.1  0.0 - 0.1 K/uL Final   Immature Granulocytes 11/06/2022 1  % Final   Abs Immature Granulocytes 11/06/2022 0.04  0.00 - 0.07 K/uL Final   Performed at [facility], [address] [place], [place] [address]   Sodium 11/06/2022 137  135 - 145 mmol/L Final   Potassium 11/06/2022 3.0 (L)  3.5 - 5.1 mmol/L Final   Chloride 11/06/2022 100  98 - 111 mmol/L Final   CO2 11/06/2022 21 (L)  22 - 32 mmol/L Final   Glucose, Bld 11/06/2022 117 (H)  70 - 99 mg/dL Final   Glucose reference range applies only to samples taken after fasting for at least 8 hours.   BUN 11/06/2022 13  8 - 23 mg/dL Final   Creatinine, Ser 11/06/2022 0.48  0.44 - 1.00 mg/dL Final   Calcium 36/64/4034 9.4  8.9 - 10.3 mg/dL Final   Total Protein  11/06/2022 7.6  6.5 - 8.1 g/dL Final   Albumin 16/09/9603 4.3  3.5 - 5.0 g/dL Final   AST 54/08/8118 45 (H)  15 - 41 U/L Final   ALT 11/06/2022 38  0 - 44 U/L Final   Alkaline Phosphatase 11/06/2022 74  38 - 126 U/L Final   Total Bilirubin 11/06/2022 1.6 (H)  0.3 - 1.2 mg/dL Final   GFR, Estimated 11/06/2022 >60  >60 mL/min Final   Comment: (NOTE) Calculated using the CKD-EPI Creatinine Equation (2021)    Anion gap 11/06/2022 16 (H)  5 - 15 Final   Performed at [facility], [address], [place], [place] [address]   Alcohol, Ethyl (B) 11/06/2022 54 (H)  <10 mg/dL Final   Comment: (NOTE) Lowest detectable limit for serum alcohol is 10 mg/dL.  For medical purposes only. Performed at [facility], [address], [place], [place] [address]     Blood Alcohol level:  Lab Results  Component Value Date   ETH <10 03/19/2023   ETH 72 (H) 02/11/2023    Metabolic Disorder Labs: No results  found for: "HGBA1C", "MPG" No results found for: "PROLACTIN" No results found for: "CHOL", "TRIG", "HDL", "CHOLHDL", "VLDL", "LDLCALC"  Therapeutic Lab Levels: No results found for: "LITHIUM" No results found for: "VALPROATE" No results found for: "CBMZ"  Physical Findings   PHQ2-9    Flowsheet Row ED from 03/19/2023 in [facility] ED from 02/11/2023 in [facility]  PHQ-2 Total Score 3 1  PHQ-9 Total Score 6 4      Flowsheet Row ED from 03/19/2023 in [facility] ED from 02/11/2023 in [facility] ED from 11/06/2022 in [facility] Emergency Department at [facility]  C-SSRS RISK CATEGORY No Risk No Risk No Risk        Musculoskeletal  Strength & Muscle Tone: decreased Gait & Station:  more steady still small gait Patient leans: N/A  Psychiatric Specialty Exam  Presentation  General Appearance:  Disheveled  Eye Contact: Fair  Speech: Clear and Coherent  Speech Volume: Normal  Handedness: Right   Mood and Affect  Mood: Dysphoric  Affect: Non-Congruent   Thought Process  Thought Processes: Goal Directed  Descriptions of Associations:Circumstantial  Orientation:Full (Time, Place and Person)  Thought Content:Rumination  Diagnosis of Schizophrenia or Schizoaffective disorder in past: No    Hallucinations:Hallucinations: None  Ideas of Reference:None  Suicidal Thoughts:Suicidal Thoughts: No  Homicidal Thoughts:Homicidal Thoughts: No   Sensorium  Memory: Immediate Fair; Recent Fair  Judgment: Impaired  Insight: Shallow   Executive Functions  Concentration: Fair  Attention Span: Fair  Recall: Good  Fund of Knowledge: Fair  Language: Fair   Psychomotor Activity  Psychomotor Activity: Psychomotor Activity: Decreased   Assets  Assets: Communication Skills; Desire for Improvement; Resilience; Social  Support; Housing; Financial Resources/Insurance   Sleep  Sleep: Sleep: Good   No data recorded  Physical Exam  Physical Exam HENT:     Head: Normocephalic and atraumatic.  Pulmonary:     Effort: Pulmonary effort is normal.  Neurological:     Mental Status: She is alert and oriented to person, place, and time.    Review of Systems  Gastrointestinal:  Positive for diarrhea.  Genitourinary:  Negative for dysuria.  Psychiatric/Behavioral:  Negative for hallucinations and suicidal ideas. The patient does not have insomnia.    Blood pressure 136/75, pulse 79, temperature 97.7 F (36.5 C),  temperature source Oral, resp. rate 18, height  (1.676 m), weight 147 lb (66.7 kg), SpO2 97 %. Body mass index is 23.73 kg/m.  Treatment Plan Summary: Daily contact with patient to assess and evaluate symptoms and progress in treatment and Medication management   Based on assessment today patient continues to be interested in rehab. Insight continues to be poor, but she would at least admit that in the past year her Etoh intake is a problem. She also associates her loneliness with increased Etoh and recognizes that being alone right now is not in her best interest, and this is improvement. She continues to perseverate on her families flaws and struggles with introspection, and has VERY poor insight into this.Do hope that continued nutrition and UTI treatment may help with some of patient's cognition.   Etoh use d/o Hx of complicated withdrawal from BZDs Hx of BZD delirium MDD GAD - Continue Zoloft  daily - Continue Buspar  BID CIWA Librium protocol initiated: -Loperamide 2 to 4 mg oral as needed/diarrhea or loose stools - chlordiazepoxide (Librium) 25 mg 4 times daily x4 doses, 25 mg 3 times daily x3 doses,25 mg 2 times daily x2 doses, 25 mg daily x1 dose -chlordiazepoxide (LIbrium) 25 mg every 6 hours as needed CIWA greater than 10 -Multivitamin with minerals 1 tablet  daily -Ondansetron disintegrating tablet 4 mg every 6 as needed/nausea or vomiting -Thiamine injection 100 mg IM once -Thiamine tablet 500 mg Q8 hours   Hypokalmeia Hyponatremia Diarrhea - Recheck labs Monday - Recommend no more than 1 cup of coffee/ day   Hyperbilirubinemia - Recheck on Monday, likely 2/2 to Etoh use   HTN - Continue amlodipine  daily   UTI Diarrhea - Probiotics - keflex  BID for 5 days     PGY-3 [name], MD 03/21/2023 11:35 AM

## 2023-03-21 NOTE — ED Notes (Signed)
Patient reported she had voided on herself during the night. Patient was assisted by staff to wash her clothing and get clean scrubs. Patient's bed was cleaned by staff as well. Patient was reminded to come to breakfast. Patient denies SI/HI and AVH. Patient received her medications without difficulty. Patient will be monitored for safety.

## 2023-03-21 NOTE — ED Notes (Signed)
Patient was informed that breakfast is being served

## 2023-03-21 NOTE — ED Notes (Signed)
Pt is in the bed sleeping. Respirations are even and unlabored. No acute distress noted. Will continue to monitor for safety. 

## 2023-03-22 DIAGNOSIS — Z789 Other specified health status: Secondary | ICD-10-CM | POA: Diagnosis not present

## 2023-03-22 DIAGNOSIS — F1090 Alcohol use, unspecified, uncomplicated: Secondary | ICD-10-CM | POA: Diagnosis not present

## 2023-03-22 DIAGNOSIS — F332 Major depressive disorder, recurrent severe without psychotic features: Secondary | ICD-10-CM | POA: Diagnosis not present

## 2023-03-22 LAB — COMPREHENSIVE METABOLIC PANEL
ALT: 18 U/L (ref 0–44)
AST: 16 U/L (ref 15–41)
Albumin: 3.8 g/dL (ref 3.5–5.0)
Alkaline Phosphatase: 63 U/L (ref 38–126)
Anion gap: 9 (ref 5–15)
BUN: 22 mg/dL (ref 8–23)
CO2: 27 mmol/L (ref 22–32)
Calcium: 9.4 mg/dL (ref 8.9–10.3)
Chloride: 99 mmol/L (ref 98–111)
Creatinine, Ser: 0.72 mg/dL (ref 0.44–1.00)
GFR, Estimated: >60 mL/min (ref >60)
Glucose, Bld: 97 mg/dL (ref 70–99)
Potassium: 3.6 mmol/L (ref 3.5–5.1)
Sodium: 135 mmol/L (ref 135–145)
Total Bilirubin: 0.7 mg/dL (ref 0.3–1.2)
Total Protein: 6.9 g/dL (ref 6.5–8.1)

## 2023-03-22 NOTE — ED Notes (Signed)
Pt is in the bed sleeping. Respirations are even and unlabored. No acute distress noted. Will continue to monitor for safety. 

## 2023-03-22 NOTE — ED Provider Notes (Signed)
Behavioral Health Progress Note  Date and Time: 03/22/2023 3:29 PM Name: Stephanie Riley MRN:  [id number]  Subjective:  Stephanie Riley is a 72 yo patient with a PPH of MDD, Etoh use disorder, self-reported ADHD, anxiety, and chronic prescribed BZD use.  Current regimen:   Zoloft 100 mg daily BuSpar 5 mg twice daily (started/10/2023) Trazodone 100-150 mg nightly Librium Taper   She presented to [facility] and then [facility] at the request of provider for Etoh detox in the setting of hx complicated withdrawal from BZDs.   On assessment today patient reports that she is feeling down due to the separation from her husband. She again reports urinary urgency with some incontinence. She reports that she slept well, and her appetite is improving. She continues to report diarrheal episodes. She maintains that she is interested in going to rehab as she desires to be around others. She denies HI, SI, and AVH.   Diagnosis:  Final diagnoses:  Admits to alcohol use    Total Time spent with patient: 20 minutes  Past Psychiatric History:  OPT: Hx of Adderall, Zoloft, Trazodone, Wellbutrin, Xanax, Klonopin, no psychiatry opt 12/2022- INPT at [facility] , patient noted to have yellow/orange sclera on arrival, but cleared by discharge. Dx with Etoh use d/o,. MOCA was done due to cognitive concerns, scored 23/30. 2nd hosp in 2024- [facility], BZD withdrawal Dx: MDD, Etoh use disorder, ADHD, anxiety, and chronic prescribed BZD use. Therapy: marriage counseling Past Medical History: HTN Family History: Both parents had Alzhmier's  Family Psychiatric  History: na Social History: -Living alone, separated from husband -Daughter in [place] - was a Runner, broadcasting/film/video - enjoyed working out at least 1 year ago    Additional Social History:    Pain Medications: See [facility] Prescriptions: See MAR Over the Counter: See MAR History of alcohol / drug use?: Yes Longest period of sobriety (when/how long): Unknown Negative Consequences of  Use: Personal relationships Withdrawal Symptoms: Agitation, Irritability Name of Substance 1: Alcohol 1 - Age of First Use: 17 1 - Amount (size/oz): Pt reports amounts vary from 1 to 3 glasses of wine daily 1 - Frequency: Daily 1 - Duration: Ongoing 1 - Last Use / Amount: Last 24 hours patient reports "a couple glasses of wine." 1 - Method of Aquiring: Legally 1- Route of Use: Oral                  Sleep: Good  Appetite:  Fair  Current Medications:  Current Facility-Administered Medications  Medication Dose Route Frequency Provider Last Rate Last Admin   acetaminophen (TYLENOL) tablet 650 mg  650 mg Oral Q6H PRN Lenard Lance, FNP       alum & mag hydroxide-simeth (MAALOX/MYLANTA) 200-200-20 MG/5ML suspension 30 mL  30 mL Oral Q4H PRN Lenard Lance, FNP       amLODipine (NORVASC) tablet 10 mg  10 mg Oral Daily Lenard Lance, FNP   10 mg at 03/22/23 0934   busPIRone (BUSPAR) tablet 5 mg  5 mg Oral BID Kizzie Ide B, MD   5 mg at 03/22/23 0935   cephALEXin (KEFLEX) capsule 500 mg  500 mg Oral Q12H Eliseo Gum B, MD   500 mg at 03/22/23 0934   cyanocobalamin (VITAMIN B12) tablet 1,000 mcg  1,000 mcg Oral Daily Kizzie Ide B, MD   1,000 mcg at 03/22/23 0934   losartan (COZAAR) tablet 100 mg  100 mg Oral Daily Nelly Rout, MD   100 mg at 03/22/23  6213   And   hydrochlorothiazide (HYDRODIURIL) tablet 12.5 mg  12.5 mg Oral Daily Nelly Rout, MD   12.5 mg at 03/22/23 0935   hydrOXYzine (VISTARIL) capsule 25 mg  25 mg Oral BID PRN Lenard Lance, FNP       magnesium hydroxide (MILK OF MAGNESIA) suspension 30 mL  30 mL Oral Daily PRN Lenard Lance, FNP       multivitamin with minerals tablet 1 tablet  1 tablet Oral Daily Lenard Lance, FNP   1 tablet at 03/22/23 0935   saccharomyces boulardii (FLORASTOR) capsule 250 mg  250 mg Oral Daily Eliseo Gum B, MD   250 mg at 03/22/23 0934   sertraline (ZOLOFT) tablet 100 mg  100 mg Oral Daily Kizzie Ide B, MD   100 mg at  03/22/23 0935   thiamine (VITAMIN B1) tablet 250 mg  250 mg Oral Daily Eliseo Gum B, MD   250 mg at 03/22/23 0934   traZODone (DESYREL) tablet 100 mg  100 mg Oral QHS Lenard Lance, FNP   100 mg at 03/21/23 2116   Current Outpatient Medications  Medication Sig Dispense Refill   buPROPion (WELLBUTRIN XL) 150 MG 24 hr tablet Take 150 mg by mouth every morning.     busPIRone (BUSPAR) 5 MG tablet Take 5 mg by mouth 2 (two) times daily.     Cyanocobalamin (VITAMIN B-12 PO) Take 1 tablet by mouth daily.     hydrOXYzine (VISTARIL) 25 MG capsule Take 1 capsule (25 mg total) by mouth 2 (two) times daily as needed for anxiety. 14 capsule 0   losartan-hydrochlorothiazide (HYZAAR) 100-12.5 MG tablet Take 1 tablet by mouth daily.     naltrexone (DEPADE) 50 MG tablet Take 50 mg by mouth daily.     ondansetron (ZOFRAN) 4 MG tablet Take 4 mg by mouth every 8 (eight) hours as needed for nausea or vomiting.     sertraline (ZOLOFT) 100 MG tablet Take 2 tablets (200 mg total) by mouth daily. (Patient taking differently: Take 200 mg by mouth at bedtime.) 60 tablet 0   traZODone (DESYREL) 100 MG tablet Take 100 mg by mouth at bedtime.      Labs  Lab Results:  Admission on 03/19/2023  Component Date Value Ref Range Status   SARS Coronavirus 2 by RT PCR 03/19/2023 NEGATIVE  NEGATIVE Final   Performed at [facility], [address], [place], [place] [address]   WBC 03/19/2023 9.3  4.0 - 10.5 K/uL Final   RBC 03/19/2023 3.87  3.87 - 5.11 MIL/uL Final   Hemoglobin 03/19/2023 11.8 (L)  12.0 - 15.0 g/dL Final   HCT 84/69/6295 35.3 (L)  36.0 - 46.0 % Final   MCV 03/19/2023 91.2  80.0 - 100.0 fL Final   MCH 03/19/2023 30.5  26.0 - 34.0 pg Final   MCHC 03/19/2023 33.4  30.0 - 36.0 g/dL Final   RDW 28/41/3244 15.4  11.5 - 15.5 % Final   Platelets 03/19/2023 181  150 - 400 K/uL Final   nRBC 03/19/2023 0.0  0.0 - 0.2 % Final   Neutrophils Relative % 03/19/2023 73  % Final   Neutro Abs 03/19/2023 6.8  1.7 -  7.7 K/uL Final   Lymphocytes Relative 03/19/2023 15  % Final   Lymphs Abs 03/19/2023 1.4  0.7 - 4.0 K/uL Final   Monocytes Relative 03/19/2023 10  % Final   Monocytes Absolute 03/19/2023 0.9  0.1 - 1.0 K/uL Final  Eosinophils Relative 03/19/2023 0  % Final   Eosinophils Absolute 03/19/2023 0.0  0.0 - 0.5 K/uL Final   Basophils Relative 03/19/2023 1  % Final   Basophils Absolute 03/19/2023 0.1  0.0 - 0.1 K/uL Final   Immature Granulocytes 03/19/2023 1  % Final   Abs Immature Granulocytes 03/19/2023 0.06  0.00 - 0.07 K/uL Final   Performed at [facility], [address], [place], [place] [address]   Sodium 03/19/2023 132 (L)  135 - 145 mmol/L Final   Potassium 03/19/2023 2.8 (L)  3.5 - 5.1 mmol/L Final   Chloride 03/19/2023 95 (L)  98 - 111 mmol/L Final   CO2 03/19/2023 24  22 - 32 mmol/L Final   Glucose, Bld 03/19/2023 97  70 - 99 mg/dL Final   Glucose reference range applies only to samples taken after fasting for at least 8 hours.   BUN 03/19/2023 21  8 - 23 mg/dL Final   Creatinine, Ser 03/19/2023 0.95  0.44 - 1.00 mg/dL Final   Calcium 21/30/8657 9.0  8.9 - 10.3 mg/dL Final   Total Protein 84/69/6295 7.1  6.5 - 8.1 g/dL Final   Albumin 28/41/3244 4.5  3.5 - 5.0 g/dL Final   AST 12/09/7251 37  15 - 41 U/L Final   ALT 03/19/2023 29  0 - 44 U/L Final   Alkaline Phosphatase 03/19/2023 81  38 - 126 U/L Final   Total Bilirubin 03/19/2023 2.9 (H)  0.3 - 1.2 mg/dL Final   GFR, Estimated 03/19/2023 >60  >60 mL/min Final   Comment: (NOTE) Calculated using the CKD-EPI Creatinine Equation (2021)    Anion gap 03/19/2023 13  5 - 15 Final   Performed at [facility], [address] [place], [place] [address]   Alcohol, Ethyl (B) 03/19/2023 <10  <10 mg/dL Final   Comment: (NOTE) Lowest detectable limit for serum alcohol is 10 mg/dL.  For medical purposes only. Performed at [facility], [address], [place], [place] [address]    POC Amphetamine UR 03/21/2023 None  Detected  NONE DETECTED (Cut Off Level 1000 ng/mL) Final   POC Secobarbital (BAR) 03/21/2023 None Detected  NONE DETECTED (Cut Off Level 300 ng/mL) Final   POC Buprenorphine (BUP) 03/21/2023 None Detected  NONE DETECTED (Cut Off Level 10 ng/mL) Final   POC Oxazepam (BZO) 03/21/2023 Positive (A)  NONE DETECTED (Cut Off Level 300 ng/mL) Final   POC Cocaine UR 03/21/2023 None Detected  NONE DETECTED (Cut Off Level 300 ng/mL) Final   POC Methamphetamine UR 03/21/2023 None Detected  NONE DETECTED (Cut Off Level 1000 ng/mL) Final   POC Morphine 03/21/2023 None Detected  NONE DETECTED (Cut Off Level 300 ng/mL) Final   POC Methadone UR 03/21/2023 None Detected  NONE DETECTED (Cut Off Level 300 ng/mL) Final   POC Oxycodone UR 03/21/2023 None Detected  NONE DETECTED (Cut Off Level 100 ng/mL) Final   POC Marijuana UR 03/21/2023 None Detected  NONE DETECTED (Cut Off Level 50 ng/mL) Final   Color, Urine 03/19/2023 AMBER (A)  YELLOW Final   BIOCHEMICALS MAY BE AFFECTED BY COLOR   APPearance 03/19/2023 CLOUDY (A)  CLEAR Final   Specific Gravity, Urine 03/19/2023 1.025  1.005 - 1.030 Final   pH 03/19/2023 5.0  5.0 - 8.0 Final   Glucose, UA 03/19/2023 NEGATIVE  NEGATIVE mg/dL Final   Hgb urine dipstick 03/19/2023 NEGATIVE  NEGATIVE Final   Bilirubin Urine 03/19/2023 MODERATE (A)  NEGATIVE Final   Ketones,  ur 03/19/2023 5 (A)  NEGATIVE mg/dL Final   Protein, ur 11/91/4782 100 (A)  NEGATIVE mg/dL Final   Nitrite 95/62/1308 NEGATIVE  NEGATIVE Final   Leukocytes,Ua 03/19/2023 MODERATE (A)  NEGATIVE Final   RBC / HPF 03/19/2023 0-5  0 - 5 RBC/hpf Final   WBC, UA 03/19/2023 21-50  0 - 5 WBC/hpf Final   Bacteria, UA 03/19/2023 MANY (A)  NONE SEEN Final   Squamous Epithelial / HPF 03/19/2023 6-10  0 - 5 /HPF Final   Mucus 03/19/2023 PRESENT   Final   Hyaline Casts, UA 03/19/2023 PRESENT   Final   Non Squamous Epithelial 03/19/2023 0-5 (A)  NONE SEEN Final   Performed at [facility], [address]  [place], [place] [address]   Magnesium 03/19/2023 2.2  1.7 - 2.4 mg/dL Final   Performed at [facility], [address] [place], [place] [address]   Vitamin B-12 03/19/2023 398  180 - 914 pg/mL Final   Comment: (NOTE) This assay is not validated for testing neonatal or myeloproliferative syndrome specimens for Vitamin B12 levels. Performed at [facility], [address], [place], [place] [address]    SARSCOV2ONAVIRUS 2 AG 03/19/2023 NEGATIVE  NEGATIVE Final   Comment: (NOTE) SARS-CoV-2 antigen NOT DETECTED.   Negative results are presumptive.  Negative results do not preclude SARS-CoV-2 infection and should not be used as the sole basis for treatment or other patient management decisions, including infection  control decisions, particularly in the presence of clinical signs and  symptoms consistent with COVID-19, or in those who have been in contact with the virus.  Negative results must be combined with clinical observations, patient history, and epidemiological information. The expected result is Negative.  Fact Sheet for Patients: https://www.jennings-kim.com/  Fact Sheet for Healthcare Providers: https://alexander-rogers.biz/  This test is not yet approved or cleared by the [place] FDA and  has been authorized for detection and/or diagnosis of SARS-CoV-2 by FDA under an Emergency Use Authorization (EUA).  This EUA will remain in effect (meaning this test can be used) for the duration of  the COV                          ID-19 declaration under Section 564(b)(1) of the Act, 21 U.S.C. section 360bbb-3(b)(1), unless the authorization is terminated or revoked sooner.    Admission on 02/11/2023, Discharged on 02/17/2023  Component Date Value Ref Range Status   Alcohol, Ethyl (B) 02/11/2023 72 (H)  <10 mg/dL Final   Comment: (NOTE) Lowest detectable limit for serum alcohol is 10 mg/dL.  For medical purposes only. Performed at [facility], [address], [place], [place] [address]    Sodium 02/11/2023 136  135 - 145 mmol/L Final   Potassium 02/11/2023 3.2 (L)  3.5 - 5.1 mmol/L Final   Chloride 02/11/2023 97 (L)  98 - 111 mmol/L Final   CO2 02/11/2023 24  22 - 32 mmol/L Final   Glucose, Bld 02/11/2023 97  70 - 99 mg/dL Final   Glucose reference range applies only to samples taken after fasting for at least 8 hours.   BUN 02/11/2023 7 (L)  8 - 23 mg/dL Final   Creatinine, Ser 02/11/2023 0.47  0.44 - 1.00 mg/dL Final   Calcium 10/03/2535 9.2  8.9 - 10.3 mg/dL Final   Total Protein 64/40/3474 7.7  6.5 - 8.1 g/dL Final   Albumin 25/95/6387 4.7  3.5 - 5.0 g/dL Final   AST 91/47/8295 35  15 - 41 U/L Final   ALT 02/11/2023 34  0 - 44 U/L Final   Alkaline Phosphatase 02/11/2023 91  38 - 126 U/L Final   Total Bilirubin 02/11/2023 1.7 (H)  0.3 - 1.2 mg/dL Final   GFR, Estimated 02/11/2023 >60  >60 mL/min Final   Comment: (NOTE) Calculated using the CKD-EPI Creatinine Equation (2021)    Anion gap 02/11/2023 15  5 - 15 Final   Performed at [facility], [address], [place], [place] [address]   WBC 02/11/2023 9.2  4.0 - 10.5 K/uL Final   RBC 02/11/2023 4.23  3.87 - 5.11 MIL/uL Final   Hemoglobin 02/11/2023 13.4  12.0 - 15.0 g/dL Final   HCT 86/57/8469 38.8  36.0 - 46.0 % Final   MCV 02/11/2023 91.7  80.0 - 100.0 fL Final   MCH 02/11/2023 31.7  26.0 - 34.0 pg Final   MCHC 02/11/2023 34.5  30.0 - 36.0 g/dL Final   RDW 62/95/2841 13.0  11.5 - 15.5 % Final   Platelets 02/11/2023 422 (H)  150 - 400 K/uL Final   nRBC 02/11/2023 0.0  0.0 - 0.2 % Final   Performed at [facility], [address] [place], [place] [address]   Magnesium 02/11/2023 2.0  1.7 - 2.4 mg/dL Final   Performed at [facility], [address], [place], [place] [address]   Phosphorus 02/11/2023 3.7  2.5 - 4.6 mg/dL Final   Performed at [facility], [address], [place], [place] [address]   Vitamin B-12 02/11/2023 252   180 - 914 pg/mL Final   Comment: (NOTE) This assay is not validated for testing neonatal or myeloproliferative syndrome specimens for Vitamin B12 levels. Performed at [facility], [address], [place], [place] [address]    Folate 02/11/2023 6.3  >5.9 ng/mL Final   Performed at [facility], [address], [place], [place] [address]   TSH 02/11/2023 3.115  0.350 - 4.500 uIU/mL Final   Comment: Performed by a 3rd Generation assay with a functional sensitivity of <=0.01 uIU/mL. Performed at [facility], [address] [place], [place] [address]    SARS Coronavirus 2 by RT PCR 02/11/2023 NEGATIVE  NEGATIVE Final   Influenza A by PCR 02/11/2023 NEGATIVE  NEGATIVE Final   Influenza B by PCR 02/11/2023 NEGATIVE  NEGATIVE Final   Comment: (NOTE) The Xpert Xpress SARS-CoV-2/FLU/RSV plus assay is intended as an aid in the diagnosis of influenza from Nasopharyngeal swab specimens and should not be used as a sole basis for treatment. Nasal washings and aspirates are unacceptable for Xpert Xpress SARS-CoV-2/FLU/RSV testing.  Fact Sheet for Patients: BloggerCourse.com  Fact Sheet for Healthcare Providers: SeriousBroker.it  This test is not yet approved or cleared by the [place] FDA and has been authorized for detection and/or diagnosis of SARS-CoV-2 by FDA under an Emergency Use Authorization (EUA). This EUA will remain in effect (meaning this test can be used) for the duration of the COVID-19 declaration under Section 564(b)(1) of the Act, 21 U.S.C. section 360bbb-3(b)(1), unless the authorization is terminated or revoked.     Resp Syncytial Virus by PCR 02/11/2023 NEGATIVE  NEGATIVE Final   Comment: (NOTE) Fact Sheet for Patients: BloggerCourse.com  Fact Sheet for Healthcare Providers: SeriousBroker.it  This test is not yet approved or cleared by the  [place] FDA and has been  authorized for detection and/or diagnosis of SARS-CoV-2 by FDA under an Emergency Use Authorization (EUA). This EUA will remain in effect (meaning this test can be used) for the duration of the COVID-19 declaration under Section 564(b)(1) of the Act, 21 U.S.C. section 360bbb-3(b)(1), unless the authorization is terminated or revoked.  Performed at [facility], [address] [place], [place] [address]    Potassium 02/14/2023 4.2  3.5 - 5.1 mmol/L Final   Performed at [facility], [address] [place], [place] [address]  Admission on 11/06/2022, Discharged on 11/06/2022  Component Date Value Ref Range Status   WBC 11/06/2022 7.0  4.0 - 10.5 K/uL Final   RBC 11/06/2022 4.23  3.87 - 5.11 MIL/uL Final   Hemoglobin 11/06/2022 13.9  12.0 - 15.0 g/dL Final   HCT 82/95/6213 39.7  36.0 - 46.0 % Final   MCV 11/06/2022 93.9  80.0 - 100.0 fL Final   MCH 11/06/2022 32.9  26.0 - 34.0 pg Final   MCHC 11/06/2022 35.0  30.0 - 36.0 g/dL Final   RDW 08/65/7846 12.6  11.5 - 15.5 % Final   Platelets 11/06/2022 314  150 - 400 K/uL Final   nRBC 11/06/2022 0.0  0.0 - 0.2 % Final   Neutrophils Relative % 11/06/2022 64  % Final   Neutro Abs 11/06/2022 4.5  1.7 - 7.7 K/uL Final   Lymphocytes Relative 11/06/2022 25  % Final   Lymphs Abs 11/06/2022 1.8  0.7 - 4.0 K/uL Final   Monocytes Relative 11/06/2022 7  % Final   Monocytes Absolute 11/06/2022 0.5  0.1 - 1.0 K/uL Final   Eosinophils Relative 11/06/2022 1  % Final   Eosinophils Absolute 11/06/2022 0.1  0.0 - 0.5 K/uL Final   Basophils Relative 11/06/2022 2  % Final   Basophils Absolute 11/06/2022 0.1  0.0 - 0.1 K/uL Final   Immature Granulocytes 11/06/2022 1  % Final   Abs Immature Granulocytes 11/06/2022 0.04  0.00 - 0.07 K/uL Final   Performed at [facility], [address], [place], [place] [address]   Sodium 11/06/2022 137  135 - 145 mmol/L Final   Potassium 11/06/2022 3.0 (L)  3.5  - 5.1 mmol/L Final   Chloride 11/06/2022 100  98 - 111 mmol/L Final   CO2 11/06/2022 21 (L)  22 - 32 mmol/L Final   Glucose, Bld 11/06/2022 117 (H)  70 - 99 mg/dL Final   Glucose reference range applies only to samples taken after fasting for at least 8 hours.   BUN 11/06/2022 13  8 - 23 mg/dL Final   Creatinine, Ser 11/06/2022 0.48  0.44 - 1.00 mg/dL Final   Calcium 28/41/3244 9.4  8.9 - 10.3 mg/dL Final   Total Protein 12/09/7251 7.6  6.5 - 8.1 g/dL Final   Albumin 66/44/0347 4.3  3.5 - 5.0 g/dL Final   AST 42/59/5638 45 (H)  15 - 41 U/L Final   ALT 11/06/2022 38  0 - 44 U/L Final   Alkaline Phosphatase 11/06/2022 74  38 - 126 U/L Final   Total Bilirubin 11/06/2022 1.6 (H)  0.3 - 1.2 mg/dL Final   GFR, Estimated 11/06/2022 >60  >60 mL/min Final   Comment: (NOTE) Calculated using the CKD-EPI Creatinine Equation (2021)    Anion gap 11/06/2022 16 (H)  5 - 15 Final   Performed at [facility], [address] [place], [place] [address]   Alcohol, Ethyl (B) 11/06/2022 54 (H)  <10  mg/dL Final   Comment: (NOTE) Lowest detectable limit for serum alcohol is 10 mg/dL.  For medical purposes only. Performed at [facility], [address] [place], [place] [address]     Blood Alcohol level:  Lab Results  Component Value Date   ETH <10 03/19/2023   ETH 72 (H) 02/11/2023    Metabolic Disorder Labs: No results found for: "HGBA1C", "MPG" No results found for: "PROLACTIN" No results found for: "CHOL", "TRIG", "HDL", "CHOLHDL", "VLDL", "LDLCALC"  Therapeutic Lab Levels: No results found for: "LITHIUM" No results found for: "VALPROATE" No results found for: "CBMZ"  Physical Findings   PHQ2-9    Flowsheet Row ED from 03/19/2023 in [facility] ED from 02/11/2023 in [facility]  PHQ-2 Total Score 3 1  PHQ-9 Total Score 6 4      Flowsheet Row ED from 03/19/2023 in [facility] ED from 02/11/2023 in [facility] ED from 11/06/2022 in [facility] Emergency Department [facility]  C-SSRS RISK CATEGORY No Risk No Risk No Risk        Musculoskeletal  Strength & Muscle Tone: decreased Gait & Station:  more steady still small gait Patient leans: N/A  Psychiatric Specialty Exam  Presentation  General Appearance:  Disheveled  Eye Contact: Fair  Speech: Clear and Coherent  Speech Volume: Normal  Handedness: Right   Mood and Affect  Mood: Dysphoric  Affect: Non-Congruent   Thought Process  Thought Processes: Goal Directed  Descriptions of Associations:Circumstantial  Orientation:Full (Time, Place and Person)  Thought Content:Rumination  Diagnosis of Schizophrenia or Schizoaffective disorder in past: No    Hallucinations:Hallucinations: None  Ideas of Reference:None  Suicidal Thoughts:Suicidal Thoughts: No  Homicidal Thoughts:Homicidal Thoughts: No   Sensorium  Memory: Immediate Fair; Recent Fair  Judgment: Impaired  Insight: Shallow   Executive Functions  Concentration: Fair  Attention Span: Fair  Recall: Good  Fund of Knowledge: Fair  Language: Fair   Psychomotor Activity  Psychomotor Activity: Psychomotor Activity: Decreased   Assets  Assets: Communication Skills; Desire for Improvement; Resilience; Social Support; Housing; Financial Resources/Insurance   Sleep  Sleep: Sleep: Good    Physical Exam  Physical Exam HENT:     Head: Normocephalic and atraumatic.  Pulmonary:     Effort: Pulmonary effort is normal.  Neurological:     Mental Status: She is alert and oriented to person, place, and time.    Review of Systems  Gastrointestinal:  Positive for diarrhea.  Genitourinary:  Positive for urgency. Negative for dysuria.  Psychiatric/Behavioral:  Negative for hallucinations and suicidal ideas. The patient does not have insomnia.    Blood  pressure 122/87, pulse 97, temperature 99.1 F (37.3 C), temperature source Oral, resp. rate 16, height 5\' 6"  (1.676 m), weight 147 lb (66.7 kg), SpO2 98 %. Body mass index is 23.73 kg/m.  Treatment Plan Summary: Daily contact with patient to assess and evaluate symptoms and progress in treatment and Medication management   Based on assessment today patient continues to be interested in rehab. Insight continues to be poor, and she no longer admits that her alcohol consumption is a problem, repeatedly stating "it wasn't that much." She also associates her loneliness with increased Etoh and recognizes that being alone right now is not in her best interest, and this is improvement. She continues to perseverate on her families flaws and struggles with introspection, and has VERY poor insight into this. Do  hope that continued nutrition and UTI treatment may help with some of patient's cognition.   Etoh use d/o Hx of complicated withdrawal from BZDs Hx of BZD delirium MDD GAD - Continue Zoloft 100mg  daily - Continue Buspar 5mg  BID CIWA Librium protocol initiated: -Loperamide 2 to 4 mg oral as needed/diarrhea or loose stools - chlordiazepoxide (Librium) 25 mg 4 times daily x4 doses, 25 mg 3 times daily x3 doses,25 mg 2 times daily x2 doses, 25 mg daily x1 dose -chlordiazepoxide (LIbrium) 25 mg every 6 hours as needed CIWA greater than 10 -Multivitamin with minerals 1 tablet daily -Ondansetron disintegrating tablet 4 mg every 6 as needed/nausea or vomiting -Thiamine injection 100 mg IM once -Thiamine tablet 500 mg Q8 hours   Hypokalmeia Hyponatremia Diarrhea - Repeat labs pending - Recommend no more than 1 cup of coffee/ day   Hyperbilirubinemia - Repeat labs pending, likely 2/2 to Etoh use   HTN - Continue amlodipine 10mg  daily   UTI Diarrhea - Probiotics - keflex 500mg  BID for 5 days     PGY-2 Lamar Sprinkles, MD 03/22/2023 3:29 PM

## 2023-03-22 NOTE — ED Notes (Signed)
Patient is sleeping. Respirations equal and unlabored, skin warm and dry. No change in assessment or acuity. Routine safety checks conducted according to facility protocol. Will continue to monitor for safety.   

## 2023-03-22 NOTE — Group Note (Signed)
Group Topic: Relapse and Recovery  Group Date: 03/22/2023 Start Time: 1000 End Time: 1115 Facilitators: Cristal Ford  Department: [facility]  Number of Participants: 5  Group Focus: relapse prevention and safety plan Treatment Modality:  Psychoeducation Interventions utilized were group exercise and support Purpose: relapse prevention strategies  Name: Stephanie Riley Date of Birth: March 25, 1951  MR: [id number]    Level of Participation: active Quality of Participation: attentive and cooperative Interactions with others: gave feedback Mood/Affect: positive Triggers (if applicable): na Cognition: coherent/clear Progress: Moderate Response: na Plan: follow-up needed  Patients Problems:  Patient Active Problem List   Diagnosis Date Noted   Alcohol use disorder 03/19/2023   Alcohol use disorder, moderate, dependence 02/12/2023   Benzodiazepine withdrawal without complication 02/12/2023

## 2023-03-22 NOTE — ED Notes (Signed)
Snacks given 

## 2023-03-22 NOTE — ED Notes (Signed)
Patient continues to remain asleep in bed.  She refused breakfast this morning.  Patient is anxious but cooperative with care.  Insight remains poor but improving slowly.  Will monitor and provide a  safe environment.

## 2023-03-23 DIAGNOSIS — F1090 Alcohol use, unspecified, uncomplicated: Secondary | ICD-10-CM | POA: Diagnosis not present

## 2023-03-23 DIAGNOSIS — F332 Major depressive disorder, recurrent severe without psychotic features: Secondary | ICD-10-CM | POA: Diagnosis not present

## 2023-03-23 MED ORDER — HYDROXYZINE HCL 25 MG PO TABS
25.0000 mg | ORAL_TABLET | Freq: Two times a day (BID) | ORAL | Status: DC | PRN
  Administered 2023-03-24 – 2023-03-25 (×2): 25 mg via ORAL
  Filled 2023-03-23 (×2): qty 1

## 2023-03-23 MED ORDER — HYDROXYZINE HCL 25 MG PO TABS
ORAL_TABLET | ORAL | Status: AC
  Administered 2023-03-23: 25 mg via ORAL
  Filled 2023-03-23: qty 1

## 2023-03-23 MED ORDER — LOPERAMIDE HCL 2 MG PO CAPS: 2.0000 mg | ORAL_CAPSULE | ORAL | Status: DC | PRN

## 2023-03-23 NOTE — ED Notes (Signed)
Pt sitting in dayroom interacting with peers. No acute distress noted. No concerns voiced. Informed pt to notify staff with any needs or assistance. Pt verbalized understanding or agreement. Will continue to monitor for safety. 

## 2023-03-23 NOTE — ED Notes (Signed)
Pt c/o increased anxiety and requested Ativan. Informed pt that Ativan was not on her profile as she have completed detox protocol. Pt states, "I'll have to get Dr. Morrie Sheldon to order me some because it really help me. I just spoke with my husband and he don't get it. So my nerves are rattled". Provided education on coping skills such as deep breathing, reading a book or puzzling to get mind off of conversation with her husband. Pt received Vistaril for anxiety. Will continue to monitor for safety.
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
## 2023-03-23 NOTE — Care Management (Addendum)
[facility] Care Management   Writer referred patient to [facility] in [place] [phone]).   Writer also referred patient to [facility] [phone]).  Both facilities are reviewing the patients insurance plan.  Writer spoke to the patient husband Stephanie Riley [phone]) who reports that he is willing to pay out of pocket if there are no facilities with open beds for the patient.    4:48pm  [facility] has called back stating that the patient is tentatively accepted pending a telephone interview  Writer met with the patient and informed her of her tentative acceptance to the substance abuse program.  Patient informed the writer that there has been a miscommunication and she does not need substance abuse inpatient services.  Patient reports that she only needs occasional outpatient therapy.    Writer left a voice mail message with the patient husband regarding the patient decision to decline substance abuse services.  Writer informed Dr.Cosby.

## 2023-03-23 NOTE — ED Provider Notes (Signed)
Behavioral Health Progress Note  Date and Time: 03/23/2023 5:09 PM Name: Stephanie Riley MRN:  [id number]  Subjective:  Stephanie Riley is a 72 yo patient with a PPH of MDD, Etoh use disorder, self-reported ADHD, anxiety, and chronic prescribed BZD use.  Current regimen:   Zoloft 100 mg daily BuSpar 5 mg twice daily (started/10/2023) Trazodone 100-150 mg nightly Librium Taper   She presented to [facility] and then [facility] at the request of provider for Etoh detox in the setting of hx complicated withdrawal from BZDs.   On assessment today patient reports that she continues to feel down due to the separation from her husband. Motivational interviewing is provided, and she is appreciative. She reports some diarrhea, but it has improved overall. She reports that she slept well, and her appetite is improving. She denies other withdrawal symptoms. She maintains that she is interested in going to rehab as she desires to be around others, and is agreeable to referrals being placed to [facility] and other treatment facilities. She denies HI, SI, and AVH.   Diagnosis:  Final diagnoses:  Admits to alcohol use    Total Time spent with patient: 20 minutes  Past Psychiatric History:  OPT: Hx of Adderall, Zoloft, Trazodone, Wellbutrin, Xanax, Klonopin, no psychiatry opt 12/2022- INPT at [facility] , patient noted to have yellow/orange sclera on arrival, but cleared by discharge. Dx with Etoh use d/o,. MOCA was done due to cognitive concerns, scored 23/30. 2nd hosp in 2024- [facility], BZD withdrawal Dx: MDD, Etoh use disorder, ADHD, anxiety, and chronic prescribed BZD use. Therapy: marriage counseling Past Medical History: HTN Family History: Both parents had Alzhmier's  Family Psychiatric  History: na Social History: -Living alone, separated from husband -Daughter in [place] - was a Runner, broadcasting/film/video - enjoyed working out at least 1 year ago    Additional Social History:    Pain Medications: See  [facility] Prescriptions: See MAR Over the Counter: See MAR History of alcohol / drug use?: Yes Longest period of sobriety (when/how long): Unknown Negative Consequences of Use: Personal relationships Withdrawal Symptoms: Agitation, Irritability Name of Substance 1: Alcohol 1 - Age of First Use: 17 1 - Amount (size/oz): Pt reports amounts vary from 1 to 3 glasses of wine daily 1 - Frequency: Daily 1 - Duration: Ongoing 1 - Last Use / Amount: Last 24 hours patient reports "a couple glasses of wine." 1 - Method of Aquiring: Legally 1- Route of Use: Oral                  Sleep: Good  Appetite:  Fair  Current Medications:  Current Facility-Administered Medications  Medication Dose Route Frequency Provider Last Rate Last Admin   acetaminophen (TYLENOL) tablet 650 mg  650 mg Oral Q6H PRN Lenard Lance, FNP       alum & mag hydroxide-simeth (MAALOX/MYLANTA) 200-200-20 MG/5ML suspension 30 mL  30 mL Oral Q4H PRN Lenard Lance, FNP       amLODipine (NORVASC) tablet 10 mg  10 mg Oral Daily Lenard Lance, FNP   10 mg at 03/23/23 0931   busPIRone (BUSPAR) tablet 5 mg  5 mg Oral BID Kizzie Ide B, MD   5 mg at 03/23/23 0932   cephALEXin (KEFLEX) capsule 500 mg  500 mg Oral Q12H Eliseo Gum B, MD   500 mg at 03/23/23 4098   cyanocobalamin (VITAMIN B12) tablet 1,000 mcg  1,000 mcg Oral Daily Lance Muss, MD   1,000 mcg at  03/23/23 0932   losartan (COZAAR) tablet 100 mg  100 mg Oral Daily Nelly Rout, MD   100 mg at 03/23/23 0932   And   hydrochlorothiazide (HYDRODIURIL) tablet 12.5 mg  12.5 mg Oral Daily Nelly Rout, MD   12.5 mg at 03/23/23 0933   hydrOXYzine (VISTARIL) capsule 25 mg  25 mg Oral BID PRN Lenard Lance, FNP       loperamide (IMODIUM) capsule 2-4 mg  2-4 mg Oral PRN Lamar Sprinkles, MD       magnesium hydroxide (MILK OF MAGNESIA) suspension 30 mL  30 mL Oral Daily PRN Lenard Lance, FNP       multivitamin with minerals tablet 1 tablet  1 tablet Oral Daily Lenard Lance, FNP   1 tablet at 03/23/23 0932   saccharomyces boulardii (FLORASTOR) capsule 250 mg  250 mg Oral Daily Eliseo Gum B, MD   250 mg at 03/23/23 0933   sertraline (ZOLOFT) tablet 100 mg  100 mg Oral Daily Kizzie Ide B, MD   100 mg at 03/23/23 0932   thiamine (VITAMIN B1) tablet 250 mg  250 mg Oral Daily Eliseo Gum B, MD   250 mg at 03/23/23 0933   traZODone (DESYREL) tablet 100 mg  100 mg Oral QHS Lenard Lance, FNP   100 mg at 03/22/23 2126   Current Outpatient Medications  Medication Sig Dispense Refill   buPROPion (WELLBUTRIN XL) 150 MG 24 hr tablet Take 150 mg by mouth every morning.     busPIRone (BUSPAR) 5 MG tablet Take 5 mg by mouth 2 (two) times daily.     Cyanocobalamin (VITAMIN B-12 PO) Take 1 tablet by mouth daily.     hydrOXYzine (VISTARIL) 25 MG capsule Take 1 capsule (25 mg total) by mouth 2 (two) times daily as needed for anxiety. 14 capsule 0   losartan-hydrochlorothiazide (HYZAAR) 100-12.5 MG tablet Take 1 tablet by mouth daily.     naltrexone (DEPADE) 50 MG tablet Take 50 mg by mouth daily.     ondansetron (ZOFRAN) 4 MG tablet Take 4 mg by mouth every 8 (eight) hours as needed for nausea or vomiting.     sertraline (ZOLOFT) 100 MG tablet Take 2 tablets (200 mg total) by mouth daily. (Patient taking differently: Take 200 mg by mouth at bedtime.) 60 tablet 0   traZODone (DESYREL) 100 MG tablet Take 100 mg by mouth at bedtime.      Labs  Lab Results:  Admission on 03/19/2023  Component Date Value Ref Range Status   SARS Coronavirus 2 by RT PCR 03/19/2023 NEGATIVE  NEGATIVE Final   Performed at [facility], [address] [place], [place] [address]   WBC 03/19/2023 9.3  4.0 - 10.5 K/uL Final   RBC 03/19/2023 3.87  3.87 - 5.11 MIL/uL Final   Hemoglobin 03/19/2023 11.8 (L)  12.0 - 15.0 g/dL Final   HCT 60/45/4098 35.3 (L)  36.0 - 46.0 % Final   MCV 03/19/2023 91.2  80.0 - 100.0 fL Final   MCH 03/19/2023 30.5  26.0 - 34.0 pg Final   MCHC 03/19/2023  33.4  30.0 - 36.0 g/dL Final   RDW 11/91/4782 15.4  11.5 - 15.5 % Final   Platelets 03/19/2023 181  150 - 400 K/uL Final   nRBC 03/19/2023 0.0  0.0 - 0.2 % Final   Neutrophils Relative % 03/19/2023 73  % Final   Neutro Abs 03/19/2023 6.8  1.7 - 7.7 K/uL Final  Lymphocytes Relative 03/19/2023 15  % Final   Lymphs Abs 03/19/2023 1.4  0.7 - 4.0 K/uL Final   Monocytes Relative 03/19/2023 10  % Final   Monocytes Absolute 03/19/2023 0.9  0.1 - 1.0 K/uL Final   Eosinophils Relative 03/19/2023 0  % Final   Eosinophils Absolute 03/19/2023 0.0  0.0 - 0.5 K/uL Final   Basophils Relative 03/19/2023 1  % Final   Basophils Absolute 03/19/2023 0.1  0.0 - 0.1 K/uL Final   Immature Granulocytes 03/19/2023 1  % Final   Abs Immature Granulocytes 03/19/2023 0.06  0.00 - 0.07 K/uL Final   Performed at [facility], [address] [place], [place] [address]   Sodium 03/19/2023 132 (L)  135 - 145 mmol/L Final   Potassium 03/19/2023 2.8 (L)  3.5 - 5.1 mmol/L Final   Chloride 03/19/2023 95 (L)  98 - 111 mmol/L Final   CO2 03/19/2023 24  22 - 32 mmol/L Final   Glucose, Bld 03/19/2023 97  70 - 99 mg/dL Final   Glucose reference range applies only to samples taken after fasting for at least 8 hours.   BUN 03/19/2023 21  8 - 23 mg/dL Final   Creatinine, Ser 03/19/2023 0.95  0.44 - 1.00 mg/dL Final   Calcium 60/45/4098 9.0  8.9 - 10.3 mg/dL Final   Total Protein 11/91/4782 7.1  6.5 - 8.1 g/dL Final   Albumin 95/62/1308 4.5  3.5 - 5.0 g/dL Final   AST 65/78/4696 37  15 - 41 U/L Final   ALT 03/19/2023 29  0 - 44 U/L Final   Alkaline Phosphatase 03/19/2023 81  38 - 126 U/L Final   Total Bilirubin 03/19/2023 2.9 (H)  0.3 - 1.2 mg/dL Final   GFR, Estimated 03/19/2023 >60  >60 mL/min Final   Comment: (NOTE) Calculated using the CKD-EPI Creatinine Equation (2021)    Anion gap 03/19/2023 13  5 - 15 Final   Performed at [facility], [address], [place], [place] [address]   Alcohol, Ethyl (B)  03/19/2023 <10  <10 mg/dL Final   Comment: (NOTE) Lowest detectable limit for serum alcohol is 10 mg/dL.  For medical purposes only. Performed at [facility], [address], [place], [place] [address]    POC Amphetamine UR 03/21/2023 None Detected  NONE DETECTED (Cut Off Level 1000 ng/mL) Final   POC Secobarbital (BAR) 03/21/2023 None Detected  NONE DETECTED (Cut Off Level 300 ng/mL) Final   POC Buprenorphine (BUP) 03/21/2023 None Detected  NONE DETECTED (Cut Off Level 10 ng/mL) Final   POC Oxazepam (BZO) 03/21/2023 Positive (A)  NONE DETECTED (Cut Off Level 300 ng/mL) Final   POC Cocaine UR 03/21/2023 None Detected  NONE DETECTED (Cut Off Level 300 ng/mL) Final   POC Methamphetamine UR 03/21/2023 None Detected  NONE DETECTED (Cut Off Level 1000 ng/mL) Final   POC Morphine 03/21/2023 None Detected  NONE DETECTED (Cut Off Level 300 ng/mL) Final   POC Methadone UR 03/21/2023 None Detected  NONE DETECTED (Cut Off Level 300 ng/mL) Final   POC Oxycodone UR 03/21/2023 None Detected  NONE DETECTED (Cut Off Level 100 ng/mL) Final   POC Marijuana UR 03/21/2023 None Detected  NONE DETECTED (Cut Off Level 50 ng/mL) Final   Color, Urine 03/19/2023 AMBER (A)  YELLOW Final   BIOCHEMICALS MAY BE AFFECTED BY COLOR   APPearance 03/19/2023 CLOUDY (A)  CLEAR Final   Specific Gravity, Urine 03/19/2023 1.025  1.005 - 1.030 Final  pH 03/19/2023 5.0  5.0 - 8.0 Final   Glucose, UA 03/19/2023 NEGATIVE  NEGATIVE mg/dL Final   Hgb urine dipstick 03/19/2023 NEGATIVE  NEGATIVE Final   Bilirubin Urine 03/19/2023 MODERATE (A)  NEGATIVE Final   Ketones, ur 03/19/2023 5 (A)  NEGATIVE mg/dL Final   Protein, ur 16/09/9603 100 (A)  NEGATIVE mg/dL Final   Nitrite 54/08/8118 NEGATIVE  NEGATIVE Final   Leukocytes,Ua 03/19/2023 MODERATE (A)  NEGATIVE Final   RBC / HPF 03/19/2023 0-5  0 - 5 RBC/hpf Final   WBC, UA 03/19/2023 21-50  0 - 5 WBC/hpf Final   Bacteria, UA 03/19/2023 MANY (A)  NONE SEEN Final   Squamous  Epithelial / HPF 03/19/2023 6-10  0 - 5 /HPF Final   Mucus 03/19/2023 PRESENT   Final   Hyaline Casts, UA 03/19/2023 PRESENT   Final   Non Squamous Epithelial 03/19/2023 0-5 (A)  NONE SEEN Final   Performed at [facility], [address], [place], [place] [address]   Magnesium 03/19/2023 2.2  1.7 - 2.4 mg/dL Final   Performed at [facility], [address] [place], [place] [address]   Vitamin B-12 03/19/2023 398  180 - 914 pg/mL Final   Comment: (NOTE) This assay is not validated for testing neonatal or myeloproliferative syndrome specimens for Vitamin B12 levels. Performed at [facility], [address] [place], [place] [address]    SARSCOV2ONAVIRUS 2 AG 03/19/2023 NEGATIVE  NEGATIVE Final   Comment: (NOTE) SARS-CoV-2 antigen NOT DETECTED.   Negative results are presumptive.  Negative results do not preclude SARS-CoV-2 infection and should not be used as the sole basis for treatment or other patient management decisions, including infection  control decisions, particularly in the presence of clinical signs and  symptoms consistent with COVID-19, or in those who have been in contact with the virus.  Negative results must be combined with clinical observations, patient history, and epidemiological information. The expected result is Negative.  Fact Sheet for Patients: https://www.jennings-kim.com/  Fact Sheet for Healthcare Providers: https://alexander-rogers.biz/  This test is not yet approved or cleared by the [place] FDA and  has been authorized for detection and/or diagnosis of SARS-CoV-2 by FDA under an Emergency Use Authorization (EUA).  This EUA will remain in effect (meaning this test can be used) for the duration of  the COV                          ID-19 declaration under Section 564(b)(1) of the Act, 21 U.S.C. section 360bbb-3(b)(1), unless the authorization is terminated or revoked sooner.     Sodium 03/22/2023  135  135 - 145 mmol/L Final   Potassium 03/22/2023 3.6  3.5 - 5.1 mmol/L Final   Chloride 03/22/2023 99  98 - 111 mmol/L Final   CO2 03/22/2023 27  22 - 32 mmol/L Final   Glucose, Bld 03/22/2023 97  70 - 99 mg/dL Final   Glucose reference range applies only to samples taken after fasting for at least 8 hours.   BUN 03/22/2023 22  8 - 23 mg/dL Final   Creatinine, Ser 03/22/2023 0.72  0.44 - 1.00 mg/dL Final   Calcium 78/46/9629 9.4  8.9 - 10.3 mg/dL Final   Total Protein 52/84/1324 6.9  6.5 - 8.1 g/dL Final   Albumin 40/09/2724 3.8  3.5 - 5.0 g/dL Final   AST 36/64/4034 16  15 - 41 U/L Final   ALT 03/22/2023  18  0 - 44 U/L Final   Alkaline Phosphatase 03/22/2023 63  38 - 126 U/L Final   Total Bilirubin 03/22/2023 0.7  0.3 - 1.2 mg/dL Final   GFR, Estimated 03/22/2023 >60  >60 mL/min Final   Comment: (NOTE) Calculated using the CKD-EPI Creatinine Equation (2021)    Anion gap 03/22/2023 9  5 - 15 Final   Performed at [facility], [address] [place], [place] [address]  Admission on 02/11/2023, Discharged on 02/17/2023  Component Date Value Ref Range Status   Alcohol, Ethyl (B) 02/11/2023 72 (H)  <10 mg/dL Final   Comment: (NOTE) Lowest detectable limit for serum alcohol is 10 mg/dL.  For medical purposes only. Performed at [facility], [address] [place], [place] [address]    Sodium 02/11/2023 136  135 - 145 mmol/L Final   Potassium 02/11/2023 3.2 (L)  3.5 - 5.1 mmol/L Final   Chloride 02/11/2023 97 (L)  98 - 111 mmol/L Final   CO2 02/11/2023 24  22 - 32 mmol/L Final   Glucose, Bld 02/11/2023 97  70 - 99 mg/dL Final   Glucose reference range applies only to samples taken after fasting for at least 8 hours.   BUN 02/11/2023 7 (L)  8 - 23 mg/dL Final   Creatinine, Ser 02/11/2023 0.47  0.44 - 1.00 mg/dL Final   Calcium 60/63/0160 9.2  8.9 - 10.3 mg/dL Final   Total Protein 10/93/2355 7.7  6.5 - 8.1 g/dL Final   Albumin 73/22/0254 4.7  3.5 - 5.0 g/dL Final    AST 27/05/2375 35  15 - 41 U/L Final   ALT 02/11/2023 34  0 - 44 U/L Final   Alkaline Phosphatase 02/11/2023 91  38 - 126 U/L Final   Total Bilirubin 02/11/2023 1.7 (H)  0.3 - 1.2 mg/dL Final   GFR, Estimated 02/11/2023 >60  >60 mL/min Final   Comment: (NOTE) Calculated using the CKD-EPI Creatinine Equation (2021)    Anion gap 02/11/2023 15  5 - 15 Final   Performed at [facility], [address], [place], [place] [address]   WBC 02/11/2023 9.2  4.0 - 10.5 K/uL Final   RBC 02/11/2023 4.23  3.87 - 5.11 MIL/uL Final   Hemoglobin 02/11/2023 13.4  12.0 - 15.0 g/dL Final   HCT 17/61/6073 38.8  36.0 - 46.0 % Final   MCV 02/11/2023 91.7  80.0 - 100.0 fL Final   MCH 02/11/2023 31.7  26.0 - 34.0 pg Final   MCHC 02/11/2023 34.5  30.0 - 36.0 g/dL Final   RDW 71/05/2693 13.0  11.5 - 15.5 % Final   Platelets 02/11/2023 422 (H)  150 - 400 K/uL Final   nRBC 02/11/2023 0.0  0.0 - 0.2 % Final   Performed at [facility], [address] [place], [place] [address]   Magnesium 02/11/2023 2.0  1.7 - 2.4 mg/dL Final   Performed at [facility], [address], [place], [place] [address]   Phosphorus 02/11/2023 3.7  2.5 - 4.6 mg/dL Final   Performed at [facility], [address], [place], [place] [address]   Vitamin B-12 02/11/2023 252  180 - 914 pg/mL Final   Comment: (NOTE) This assay is not validated for testing neonatal or myeloproliferative syndrome specimens for Vitamin B12 levels. Performed at [facility], [address], [place], [place] [address]    Folate 02/11/2023 6.3  >5.9 ng/mL Final  Performed at [facility], [address] [place], [place] [address]   TSH 02/11/2023 3.115  0.350 - 4.500 uIU/mL Final   Comment: Performed by a 3rd Generation assay with a functional sensitivity of <=0.01 uIU/mL. Performed at [facility], [address], [place], [place] [address]    SARS Coronavirus 2 by RT PCR 02/11/2023 NEGATIVE  NEGATIVE Final    Influenza A by PCR 02/11/2023 NEGATIVE  NEGATIVE Final   Influenza B by PCR 02/11/2023 NEGATIVE  NEGATIVE Final   Comment: (NOTE) The Xpert Xpress SARS-CoV-2/FLU/RSV plus assay is intended as an aid in the diagnosis of influenza from Nasopharyngeal swab specimens and should not be used as a sole basis for treatment. Nasal washings and aspirates are unacceptable for Xpert Xpress SARS-CoV-2/FLU/RSV testing.  Fact Sheet for Patients: BloggerCourse.com  Fact Sheet for Healthcare Providers: SeriousBroker.it  This test is not yet approved or cleared by the [place] FDA and has been authorized for detection and/or diagnosis of SARS-CoV-2 by FDA under an Emergency Use Authorization (EUA). This EUA will remain in effect (meaning this test can be used) for the duration of the COVID-19 declaration under Section 564(b)(1) of the Act, 21 U.S.C. section 360bbb-3(b)(1), unless the authorization is terminated or revoked.     Resp Syncytial Virus by PCR 02/11/2023 NEGATIVE  NEGATIVE Final   Comment: (NOTE) Fact Sheet for Patients: BloggerCourse.com  Fact Sheet for Healthcare Providers: SeriousBroker.it  This test is not yet approved or cleared by the [place] FDA and has been authorized for detection and/or diagnosis of SARS-CoV-2 by FDA under an Emergency Use Authorization (EUA). This EUA will remain in effect (meaning this test can be used) for the duration of the COVID-19 declaration under Section 564(b)(1) of the Act, 21 U.S.C. section 360bbb-3(b)(1), unless the authorization is terminated or revoked.  Performed at [facility], [address], [place], [place] [address]    Potassium 02/14/2023 4.2  3.5 - 5.1 mmol/L Final   Performed at [facility], [address], [place], [place] [address]  Admission on 11/06/2022, Discharged on 11/06/2022  Component Date Value  Ref Range Status   WBC 11/06/2022 7.0  4.0 - 10.5 K/uL Final   RBC 11/06/2022 4.23  3.87 - 5.11 MIL/uL Final   Hemoglobin 11/06/2022 13.9  12.0 - 15.0 g/dL Final   HCT 29/56/2130 39.7  36.0 - 46.0 % Final   MCV 11/06/2022 93.9  80.0 - 100.0 fL Final   MCH 11/06/2022 32.9  26.0 - 34.0 pg Final   MCHC 11/06/2022 35.0  30.0 - 36.0 g/dL Final   RDW 86/57/8469 12.6  11.5 - 15.5 % Final   Platelets 11/06/2022 314  150 - 400 K/uL Final   nRBC 11/06/2022 0.0  0.0 - 0.2 % Final   Neutrophils Relative % 11/06/2022 64  % Final   Neutro Abs 11/06/2022 4.5  1.7 - 7.7 K/uL Final   Lymphocytes Relative 11/06/2022 25  % Final   Lymphs Abs 11/06/2022 1.8  0.7 - 4.0 K/uL Final   Monocytes Relative 11/06/2022 7  % Final   Monocytes Absolute 11/06/2022 0.5  0.1 - 1.0 K/uL Final   Eosinophils Relative 11/06/2022 1  % Final   Eosinophils Absolute 11/06/2022 0.1  0.0 - 0.5 K/uL Final   Basophils Relative 11/06/2022 2  % Final   Basophils Absolute 11/06/2022 0.1  0.0 - 0.1 K/uL Final   Immature Granulocytes 11/06/2022 1  % Final   Abs Immature  Granulocytes 11/06/2022 0.04  0.00 - 0.07 K/uL Final   Performed at [facility], [address] [place], [place] [address]   Sodium 11/06/2022 137  135 - 145 mmol/L Final   Potassium 11/06/2022 3.0 (L)  3.5 - 5.1 mmol/L Final   Chloride 11/06/2022 100  98 - 111 mmol/L Final   CO2 11/06/2022 21 (L)  22 - 32 mmol/L Final   Glucose, Bld 11/06/2022 117 (H)  70 - 99 mg/dL Final   Glucose reference range applies only to samples taken after fasting for at least 8 hours.   BUN 11/06/2022 13  8 - 23 mg/dL Final   Creatinine, Ser 11/06/2022 0.48  0.44 - 1.00 mg/dL Final   Calcium 57/84/6962 9.4  8.9 - 10.3 mg/dL Final   Total Protein 95/28/4132 7.6  6.5 - 8.1 g/dL Final   Albumin 44/12/270 4.3  3.5 - 5.0 g/dL Final   AST 53/66/4403 45 (H)  15 - 41 U/L Final   ALT 11/06/2022 38  0 - 44 U/L Final   Alkaline Phosphatase 11/06/2022 74  38 - 126 U/L Final    Total Bilirubin 11/06/2022 1.6 (H)  0.3 - 1.2 mg/dL Final   GFR, Estimated 11/06/2022 >60  >60 mL/min Final   Comment: (NOTE) Calculated using the CKD-EPI Creatinine Equation (2021)    Anion gap 11/06/2022 16 (H)  5 - 15 Final   Performed at [facility], [address], [place], [place] [address]   Alcohol, Ethyl (B) 11/06/2022 54 (H)  <10 mg/dL Final   Comment: (NOTE) Lowest detectable limit for serum alcohol is 10 mg/dL.  For medical purposes only. Performed at [facility], [address], [place], [place] [address]     Blood Alcohol level:  Lab Results  Component Value Date   ETH <10 03/19/2023   ETH 72 (H) 02/11/2023    Metabolic Disorder Labs: No results found for: "HGBA1C", "MPG" No results found for: "PROLACTIN" No results found for: "CHOL", "TRIG", "HDL", "CHOLHDL", "VLDL", "LDLCALC"  Therapeutic Lab Levels: No results found for: "LITHIUM" No results found for: "VALPROATE" No results found for: "CBMZ"  Physical Findings   PHQ2-9    Flowsheet Row ED from 03/19/2023 in [facility] ED from 02/11/2023 in [facility]  PHQ-2 Total Score 2 1  PHQ-9 Total Score 5 4      Flowsheet Row ED from 03/19/2023 in [facility] ED from 02/11/2023 in [facility] ED from 11/06/2022 in [facility] Emergency Department at [facility]  C-SSRS RISK CATEGORY No Risk No Risk No Risk        Musculoskeletal  Strength & Muscle Tone: decreased Gait & Station:  more steady still small gait Patient leans: N/A  Psychiatric Specialty Exam  Presentation  General Appearance:  Appropriate for Environment; Casual  Eye Contact: Good  Speech: Clear and Coherent; Normal Rate  Speech Volume: Normal  Handedness: Right   Mood and Affect  Mood: Depressed  Affect: Congruent; Depressed   Thought Process  Thought  Processes: Coherent; Goal Directed  Descriptions of Associations:Circumstantial  Orientation:Full (Time, Place and Person)  Thought Content:Rumination  Diagnosis of Schizophrenia or Schizoaffective disorder in past: No    Hallucinations:Hallucinations: None   Ideas of Reference:None  Suicidal Thoughts:Suicidal Thoughts: No   Homicidal Thoughts:Homicidal Thoughts: No    Sensorium  Memory: Immediate Fair; Recent Fair  Judgment: Poor  Insight: Poor; Lacking; Shallow  Executive Functions  Concentration: Fair  Attention Span: Fair  Recall: [employer] of Knowledge: Fair  Language: Fair   Psychomotor Activity  Psychomotor Activity: Psychomotor Activity: Normal    Assets  Assets: Manufacturing systems engineer; Desire for Improvement; Housing; Leisure Time; Physical Health; Social Support; Vocational/Educational   Sleep  Sleep: Sleep: Good     Physical Exam  Physical Exam HENT:     Head: Normocephalic and atraumatic.  Pulmonary:     Effort: Pulmonary effort is normal.  Neurological:     Mental Status: She is alert and oriented to person, place, and time.    Review of Systems  Gastrointestinal:  Positive for diarrhea.  Genitourinary:  Positive for urgency. Negative for dysuria.  Psychiatric/Behavioral:  Negative for hallucinations and suicidal ideas. The patient does not have insomnia.    Blood pressure 134/79, pulse 98, temperature 98.3 F (36.8 C), temperature source Oral, resp. rate 16, height 5\' 6"  (1.676 m), weight 147 lb (66.7 kg), SpO2 99 %. Body mass index is 23.73 kg/m.  Treatment Plan Summary: Daily contact with patient to assess and evaluate symptoms and progress in treatment and Medication management   Based on assessment today patient continues to be interested in rehab. Insight continues to be poor, and she no longer admits that her alcohol consumption is a problem, repeatedly stating "it wasn't that much." She also associates her  loneliness with increased Etoh and recognizes that being alone right now is not in her best interest, and this is improvement. She continues to perseverate on her families flaws and struggles with introspection, and has VERY poor insight into this. Do hope that continued nutrition and UTI treatment may help with some of patient's cognition.   Etoh use d/o Hx of complicated withdrawal from BZDs Hx of BZD delirium MDD GAD - Continue Zoloft 100mg  daily - Continue Buspar 5mg  BID CIWA Librium protocol initiated: -Loperamide 2 to 4 mg oral as needed/diarrhea or loose stools - chlordiazepoxide (Librium) 25 mg 4 times daily x4 doses, 25 mg 3 times daily x3 doses,25 mg 2 times daily x2 doses, 25 mg daily x1 dose -chlordiazepoxide (LIbrium) 25 mg every 6 hours as needed CIWA greater than 10 -Multivitamin with minerals 1 tablet daily -Ondansetron disintegrating tablet 4 mg every 6 as needed/nausea or vomiting -Thiamine injection 100 mg IM once -Thiamine tablet 500 mg Q8 hours   Hypokalmeia, resolved Hyponatremia, resolved Diarrhea, improving - Recommend no more than 1 cup of coffee/ day - Imodium PRN   Hyperbilirubinemia, resolved - Likely 2/2 to Etoh use   HTN - Continue amlodipine 10mg  daily   UTI Diarrhea - Probiotics - keflex 500mg  BID for 5 days   Dispo: Pending  PGY-2 Lamar Sprinkles, MD 03/23/2023 5:09 PM

## 2023-03-23 NOTE — Group Note (Signed)
Group Topic: Communication  Group Date: 03/23/2023 Start Time: 1130 End Time: 1145 Facilitators: Emmit Pomfret D, NT  Department: [facility]  Number of Participants: 4  Group Focus: relaxation Treatment Modality:  Psychoeducation Interventions utilized were support Purpose: regain self-worth  Name: Stephanie Riley Date of Birth: 1951-08-17  MR: [id number]    Level of Participation: moderate Quality of Participation: attentive Interactions with others: intrusive Mood/Affect: appropriate Triggers (if applicable): n/a Cognition: coherent/clear Progress: Significant Response: n/a Plan: changes to discharge plan  Patients Problems:  Patient Active Problem List   Diagnosis Date Noted   Alcohol use disorder 03/19/2023   Alcohol use disorder, moderate, dependence 02/12/2023   Benzodiazepine withdrawal without complication 02/12/2023

## 2023-03-23 NOTE — ED Notes (Signed)
Snacks given 

## 2023-03-23 NOTE — ED Notes (Signed)
Patient is sleeping. Respirations equal and unlabored, skin warm and dry. No change in assessment or acuity. Routine safety checks conducted according to facility protocol. Will continue to monitor for safety.   

## 2023-03-23 NOTE — ED Notes (Signed)
Patient A&Ox4. Denies intent to harm self/others when asked. Denies A/VH. Patient denies any physical complaints when asked. No acute distress noted. Support and encouragement provided. Routine safety checks conducted according to facility protocol. Encouraged patient to notify staff if thoughts of harm toward self or others arise. Patient verbalize understanding and agreement. Will continue to monitor for safety.    

## 2023-03-24 DIAGNOSIS — F1090 Alcohol use, unspecified, uncomplicated: Secondary | ICD-10-CM | POA: Diagnosis not present

## 2023-03-24 DIAGNOSIS — F332 Major depressive disorder, recurrent severe without psychotic features: Secondary | ICD-10-CM | POA: Diagnosis not present

## 2023-03-24 MED ORDER — MIRTAZAPINE 7.5 MG PO TABS
7.5000 mg | ORAL_TABLET | Freq: Every day | ORAL | Status: DC
  Administered 2023-03-24 – 2023-03-25 (×2): 7.5 mg via ORAL
  Filled 2023-03-24 (×2): qty 1

## 2023-03-24 MED ORDER — ONDANSETRON 4 MG PO TBDP
4.0000 mg | ORAL_TABLET | Freq: Three times a day (TID) | ORAL | Status: DC | PRN
  Administered 2023-03-24: 4 mg via ORAL
  Filled 2023-03-24: qty 1

## 2023-03-24 MED ORDER — LOPERAMIDE HCL 2 MG PO CAPS: 2.0000 mg | ORAL_CAPSULE | ORAL | Status: DC | PRN

## 2023-03-24 NOTE — ED Notes (Signed)
Snacks given 

## 2023-03-24 NOTE — ED Notes (Signed)
Pt in AA group meeting 

## 2023-03-24 NOTE — ED Notes (Signed)
Pt is currently sleeping, no distress noted, environmental check complete, will continue to monitor patient for safety.  

## 2023-03-24 NOTE — Progress Notes (Signed)
Pt is awake, alert and oriented X4. Pt did not voice any complaints of pain or discomfort. No signs of acute distress noted. Administered scheduled meds with no issue. Pt denies current SI/HI/AVH, plan or intent. Staff will monitor for pt's safety. 

## 2023-03-24 NOTE — Progress Notes (Signed)
Pt is laying on the hallway bench. No distress noted or concerns voiced. Pt has been interacting with staff/peers. Staff will monitor for pt's safety.

## 2023-03-24 NOTE — Care Management (Signed)
[facility] Care Management   11am  Writer met with patient.  Patient reports that she is now willing to consider going to [facility].  Patient reports that she still does not want to go to [place] .  Patient reports that she and her husband will need to talk more about their options because if she goes to [facility] then they will have to pay more than $20,000 for 30 days of treatment.    3pm  Writer received a phone call from the patient's husband.  Her husband reports that they are still discussing options related to which facility would be the best fit for the patient.    Writer informed Dr. Alfonse Flavors.

## 2023-03-24 NOTE — ED Notes (Signed)
Patient is currently sitting in the dayroom watching television, no distress noted, will continue to monitor patient for safety.  

## 2023-03-24 NOTE — Progress Notes (Signed)
Pt is in the dayroom watching TV and interacting with peer. Received Zofran for nausea. No other concerns voiced. Staff will monitor for pt's safety.

## 2023-03-24 NOTE — ED Provider Notes (Cosign Needed Addendum)
Behavioral Health Progress Note  Date and Time: 03/24/2023 11:26 AM Name: Stephanie Riley MRN:  [id number]  Subjective:  Stephanie Riley is a 72 yo patient with a PPH of MDD, Etoh use disorder, self-reported ADHD, anxiety, and chronic prescribed BZD use.  Current regimen:   Zoloft 100 mg daily BuSpar 5 mg twice daily (started/10/2023) Trazodone 100-150 mg nightly Librium Taper   She presented to [facility] and then [facility] at the request of provider for Etoh detox in the setting of hx complicated withdrawal from BZDs.   On assessment today patient reports that she continues to feel down due to the separation from her husband. Motivational interviewing is provided, and she is appreciative. We discuss at length residential treatment options with her outpatient psychiatrist, Dr. Morrie Sheldon. She is agreeable to [facility] in [place], [place]. She reports continued improvements to diarrhea with imodium. She reports that she is sleeping fairly, and her appetite continues to improve. She denies other withdrawal symptoms. She denies HI, SI, and AVH.   Diagnosis:  Final diagnoses:  Admits to alcohol use    Total Time spent with patient: 20 minutes  Past Psychiatric History:  OPT: Hx of Adderall, Zoloft, Trazodone, Wellbutrin, Xanax, Klonopin, no psychiatry opt 12/2022- INPT at [facility] , patient noted to have yellow/orange sclera on arrival, but cleared by discharge. Dx with Etoh use d/o,. MOCA was done due to cognitive concerns, scored 23/30. 2nd hosp in 2024- [facility], BZD withdrawal Dx: MDD, Etoh use disorder, ADHD, anxiety, and chronic prescribed BZD use. Therapy: marriage counseling Past Medical History: HTN Family History: Both parents had Alzhmier's  Family Psychiatric  History: na Social History: -Living alone, separated from husband -Daughter in [place] - was a Runner, broadcasting/film/video - enjoyed working out at least 1 year ago    Additional Social History:    Pain Medications: See  [facility] Prescriptions: See MAR Over the Counter: See MAR History of alcohol / drug use?: Yes Longest period of sobriety (when/how long): Unknown Negative Consequences of Use: Personal relationships Withdrawal Symptoms: Agitation, Irritability Name of Substance 1: Alcohol 1 - Age of First Use: 17 1 - Amount (size/oz): Pt reports amounts vary from 1 to 3 glasses of wine daily 1 - Frequency: Daily 1 - Duration: Ongoing 1 - Last Use / Amount: Last 24 hours patient reports "a couple glasses of wine." 1 - Method of Aquiring: Legally 1- Route of Use: Oral                  Sleep: Good  Appetite:  Fair  Current Medications:  Current Facility-Administered Medications  Medication Dose Route Frequency Provider Last Rate Last Admin   acetaminophen (TYLENOL) tablet 650 mg  650 mg Oral Q6H PRN Lenard Lance, FNP       alum & mag hydroxide-simeth (MAALOX/MYLANTA) 200-200-20 MG/5ML suspension 30 mL  30 mL Oral Q4H PRN Lenard Lance, FNP       amLODipine (NORVASC) tablet 10 mg  10 mg Oral Daily Lenard Lance, FNP   10 mg at 03/24/23 0924   busPIRone (BUSPAR) tablet 5 mg  5 mg Oral BID Kizzie Ide B, MD   5 mg at 03/24/23 0925   cephALEXin (KEFLEX) capsule 500 mg  500 mg Oral Q12H Eliseo Gum B, MD   500 mg at 03/24/23 4098   cyanocobalamin (VITAMIN B12) tablet 1,000 mcg  1,000 mcg Oral Daily Kizzie Ide B, MD   1,000 mcg at 03/24/23 0924   losartan (COZAAR)  tablet 100 mg  100 mg Oral Daily Nelly Rout, MD   100 mg at 03/24/23 1610   And   hydrochlorothiazide (HYDRODIURIL) tablet 12.5 mg  12.5 mg Oral Daily Nelly Rout, MD   12.5 mg at 03/24/23 9604   hydrOXYzine (ATARAX) tablet 25 mg  25 mg Oral BID PRN Herby Abraham, RPH   25 mg at 03/23/23 1729   loperamide (IMODIUM) capsule 2-4 mg  2-4 mg Oral PRN Lamar Sprinkles, MD       magnesium hydroxide (MILK OF MAGNESIA) suspension 30 mL  30 mL Oral Daily PRN Lenard Lance, FNP       multivitamin with minerals tablet 1 tablet  1 tablet  Oral Daily Lenard Lance, FNP   1 tablet at 03/24/23 0924   ondansetron (ZOFRAN-ODT) disintegrating tablet 4 mg  4 mg Oral Q8H PRN Lamar Sprinkles, MD   4 mg at 03/24/23 1041   saccharomyces boulardii (FLORASTOR) capsule 250 mg  250 mg Oral Daily Eliseo Gum B, MD   250 mg at 03/24/23 0925   sertraline (ZOLOFT) tablet 100 mg  100 mg Oral Daily Kizzie Ide B, MD   100 mg at 03/24/23 5409   thiamine (VITAMIN B1) tablet 250 mg  250 mg Oral Daily Eliseo Gum B, MD   250 mg at 03/24/23 8119   traZODone (DESYREL) tablet 100 mg  100 mg Oral QHS Lenard Lance, FNP   100 mg at 03/23/23 2110   Current Outpatient Medications  Medication Sig Dispense Refill   buPROPion (WELLBUTRIN XL) 150 MG 24 hr tablet Take 150 mg by mouth every morning.     busPIRone (BUSPAR) 5 MG tablet Take 5 mg by mouth 2 (two) times daily.     Cyanocobalamin (VITAMIN B-12 PO) Take 1 tablet by mouth daily.     hydrOXYzine (VISTARIL) 25 MG capsule Take 1 capsule (25 mg total) by mouth 2 (two) times daily as needed for anxiety. 14 capsule 0   losartan-hydrochlorothiazide (HYZAAR) 100-12.5 MG tablet Take 1 tablet by mouth daily.     naltrexone (DEPADE) 50 MG tablet Take 50 mg by mouth daily.     ondansetron (ZOFRAN) 4 MG tablet Take 4 mg by mouth every 8 (eight) hours as needed for nausea or vomiting.     sertraline (ZOLOFT) 100 MG tablet Take 2 tablets (200 mg total) by mouth daily. (Patient taking differently: Take 200 mg by mouth at bedtime.) 60 tablet 0   traZODone (DESYREL) 100 MG tablet Take 100 mg by mouth at bedtime.      Labs  Lab Results:  Admission on 03/19/2023  Component Date Value Ref Range Status   SARS Coronavirus 2 by RT PCR 03/19/2023 NEGATIVE  NEGATIVE Final   Performed at [facility], [address], [place], [place] [address]   WBC 03/19/2023 9.3  4.0 - 10.5 K/uL Final   RBC 03/19/2023 3.87  3.87 - 5.11 MIL/uL Final   Hemoglobin 03/19/2023 11.8 (L)  12.0 - 15.0 g/dL Final   HCT 95/62/1308 35.3  (L)  36.0 - 46.0 % Final   MCV 03/19/2023 91.2  80.0 - 100.0 fL Final   MCH 03/19/2023 30.5  26.0 - 34.0 pg Final   MCHC 03/19/2023 33.4  30.0 - 36.0 g/dL Final   RDW 65/78/4696 15.4  11.5 - 15.5 % Final   Platelets 03/19/2023 181  150 - 400 K/uL Final   nRBC 03/19/2023 0.0  0.0 - 0.2 % Final  Neutrophils Relative % 03/19/2023 73  % Final   Neutro Abs 03/19/2023 6.8  1.7 - 7.7 K/uL Final   Lymphocytes Relative 03/19/2023 15  % Final   Lymphs Abs 03/19/2023 1.4  0.7 - 4.0 K/uL Final   Monocytes Relative 03/19/2023 10  % Final   Monocytes Absolute 03/19/2023 0.9  0.1 - 1.0 K/uL Final   Eosinophils Relative 03/19/2023 0  % Final   Eosinophils Absolute 03/19/2023 0.0  0.0 - 0.5 K/uL Final   Basophils Relative 03/19/2023 1  % Final   Basophils Absolute 03/19/2023 0.1  0.0 - 0.1 K/uL Final   Immature Granulocytes 03/19/2023 1  % Final   Abs Immature Granulocytes 03/19/2023 0.06  0.00 - 0.07 K/uL Final   Performed at [facility], [address], [place], [place] [address]   Sodium 03/19/2023 132 (L)  135 - 145 mmol/L Final   Potassium 03/19/2023 2.8 (L)  3.5 - 5.1 mmol/L Final   Chloride 03/19/2023 95 (L)  98 - 111 mmol/L Final   CO2 03/19/2023 24  22 - 32 mmol/L Final   Glucose, Bld 03/19/2023 97  70 - 99 mg/dL Final   Glucose reference range applies only to samples taken after fasting for at least 8 hours.   BUN 03/19/2023 21  8 - 23 mg/dL Final   Creatinine, Ser 03/19/2023 0.95  0.44 - 1.00 mg/dL Final   Calcium 81/19/1478 9.0  8.9 - 10.3 mg/dL Final   Total Protein 29/56/2130 7.1  6.5 - 8.1 g/dL Final   Albumin 86/57/8469 4.5  3.5 - 5.0 g/dL Final   AST 62/95/2841 37  15 - 41 U/L Final   ALT 03/19/2023 29  0 - 44 U/L Final   Alkaline Phosphatase 03/19/2023 81  38 - 126 U/L Final   Total Bilirubin 03/19/2023 2.9 (H)  0.3 - 1.2 mg/dL Final   GFR, Estimated 03/19/2023 >60  >60 mL/min Final   Comment: (NOTE) Calculated using the CKD-EPI Creatinine Equation (2021)    Anion gap  03/19/2023 13  5 - 15 Final   Performed at [facility], [address], [place], [place] [address]   Alcohol, Ethyl (B) 03/19/2023 <10  <10 mg/dL Final   Comment: (NOTE) Lowest detectable limit for serum alcohol is 10 mg/dL.  For medical purposes only. Performed at [facility], [address], [place], [place] [address]    POC Amphetamine UR 03/21/2023 None Detected  NONE DETECTED (Cut Off Level 1000 ng/mL) Final   POC Secobarbital (BAR) 03/21/2023 None Detected  NONE DETECTED (Cut Off Level 300 ng/mL) Final   POC Buprenorphine (BUP) 03/21/2023 None Detected  NONE DETECTED (Cut Off Level 10 ng/mL) Final   POC Oxazepam (BZO) 03/21/2023 Positive (A)  NONE DETECTED (Cut Off Level 300 ng/mL) Final   POC Cocaine UR 03/21/2023 None Detected  NONE DETECTED (Cut Off Level 300 ng/mL) Final   POC Methamphetamine UR 03/21/2023 None Detected  NONE DETECTED (Cut Off Level 1000 ng/mL) Final   POC Morphine 03/21/2023 None Detected  NONE DETECTED (Cut Off Level 300 ng/mL) Final   POC Methadone UR 03/21/2023 None Detected  NONE DETECTED (Cut Off Level 300 ng/mL) Final   POC Oxycodone UR 03/21/2023 None Detected  NONE DETECTED (Cut Off Level 100 ng/mL) Final   POC Marijuana UR 03/21/2023 None Detected  NONE DETECTED (Cut Off Level 50 ng/mL) Final   Color, Urine 03/19/2023 AMBER (A)  YELLOW Final   BIOCHEMICALS MAY BE AFFECTED BY COLOR  APPearance 03/19/2023 CLOUDY (A)  CLEAR Final   Specific Gravity, Urine 03/19/2023 1.025  1.005 - 1.030 Final   pH 03/19/2023 5.0  5.0 - 8.0 Final   Glucose, UA 03/19/2023 NEGATIVE  NEGATIVE mg/dL Final   Hgb urine dipstick 03/19/2023 NEGATIVE  NEGATIVE Final   Bilirubin Urine 03/19/2023 MODERATE (A)  NEGATIVE Final   Ketones, ur 03/19/2023 5 (A)  NEGATIVE mg/dL Final   Protein, ur 21/30/8657 100 (A)  NEGATIVE mg/dL Final   Nitrite 84/69/6295 NEGATIVE  NEGATIVE Final   Leukocytes,Ua 03/19/2023 MODERATE (A)  NEGATIVE Final   RBC / HPF 03/19/2023 0-5  0 - 5  RBC/hpf Final   WBC, UA 03/19/2023 21-50  0 - 5 WBC/hpf Final   Bacteria, UA 03/19/2023 MANY (A)  NONE SEEN Final   Squamous Epithelial / HPF 03/19/2023 6-10  0 - 5 /HPF Final   Mucus 03/19/2023 PRESENT   Final   Hyaline Casts, UA 03/19/2023 PRESENT   Final   Non Squamous Epithelial 03/19/2023 0-5 (A)  NONE SEEN Final   Performed at [facility], [address], [place], [place] [address]   Magnesium 03/19/2023 2.2  1.7 - 2.4 mg/dL Final   Performed at [facility], [address] [place], [place] [address]   Vitamin B-12 03/19/2023 398  180 - 914 pg/mL Final   Comment: (NOTE) This assay is not validated for testing neonatal or myeloproliferative syndrome specimens for Vitamin B12 levels. Performed at [facility], [address], [place], [place] [address]    SARSCOV2ONAVIRUS 2 AG 03/19/2023 NEGATIVE  NEGATIVE Final   Comment: (NOTE) SARS-CoV-2 antigen NOT DETECTED.   Negative results are presumptive.  Negative results do not preclude SARS-CoV-2 infection and should not be used as the sole basis for treatment or other patient management decisions, including infection  control decisions, particularly in the presence of clinical signs and  symptoms consistent with COVID-19, or in those who have been in contact with the virus.  Negative results must be combined with clinical observations, patient history, and epidemiological information. The expected result is Negative.  Fact Sheet for Patients: https://www.jennings-kim.com/  Fact Sheet for Healthcare Providers: https://alexander-rogers.biz/  This test is not yet approved or cleared by the [place] FDA and  has been authorized for detection and/or diagnosis of SARS-CoV-2 by FDA under an Emergency Use Authorization (EUA).  This EUA will remain in effect (meaning this test can be used) for the duration of  the COV                          ID-19 declaration under Section 564(b)(1) of  the Act, 21 U.S.C. section 360bbb-3(b)(1), unless the authorization is terminated or revoked sooner.     Sodium 03/22/2023 135  135 - 145 mmol/L Final   Potassium 03/22/2023 3.6  3.5 - 5.1 mmol/L Final   Chloride 03/22/2023 99  98 - 111 mmol/L Final   CO2 03/22/2023 27  22 - 32 mmol/L Final   Glucose, Bld 03/22/2023 97  70 - 99 mg/dL Final   Glucose reference range applies only to samples taken after fasting for at least 8 hours.   BUN 03/22/2023 22  8 - 23 mg/dL Final   Creatinine, Ser 03/22/2023 0.72  0.44 - 1.00 mg/dL Final   Calcium 36/64/4034 9.4  8.9 - 10.3 mg/dL Final   Total Protein 74/25/9563 6.9  6.5 - 8.1 g/dL Final   Albumin 87/56/4332 3.8  3.5 - 5.0 g/dL Final   AST 16/09/9603 16  15 - 41 U/L Final   ALT 03/22/2023 18  0 - 44 U/L Final   Alkaline Phosphatase 03/22/2023 63  38 - 126 U/L Final   Total Bilirubin 03/22/2023 0.7  0.3 - 1.2 mg/dL Final   GFR, Estimated 03/22/2023 >60  >60 mL/min Final   Comment: (NOTE) Calculated using the CKD-EPI Creatinine Equation (2021)    Anion gap 03/22/2023 9  5 - 15 Final   Performed at [facility], [address] [place], [place] [address]  Admission on 02/11/2023, Discharged on 02/17/2023  Component Date Value Ref Range Status   Alcohol, Ethyl (B) 02/11/2023 72 (H)  <10 mg/dL Final   Comment: (NOTE) Lowest detectable limit for serum alcohol is 10 mg/dL.  For medical purposes only. Performed at [facility], [address], [place], [place] [address]    Sodium 02/11/2023 136  135 - 145 mmol/L Final   Potassium 02/11/2023 3.2 (L)  3.5 - 5.1 mmol/L Final   Chloride 02/11/2023 97 (L)  98 - 111 mmol/L Final   CO2 02/11/2023 24  22 - 32 mmol/L Final   Glucose, Bld 02/11/2023 97  70 - 99 mg/dL Final   Glucose reference range applies only to samples taken after fasting for at least 8 hours.   BUN 02/11/2023 7 (L)  8 - 23 mg/dL Final   Creatinine, Ser 02/11/2023 0.47  0.44 - 1.00 mg/dL Final   Calcium 78/29/5621 9.2   8.9 - 10.3 mg/dL Final   Total Protein 30/86/5784 7.7  6.5 - 8.1 g/dL Final   Albumin 69/62/9528 4.7  3.5 - 5.0 g/dL Final   AST 41/32/4401 35  15 - 41 U/L Final   ALT 02/11/2023 34  0 - 44 U/L Final   Alkaline Phosphatase 02/11/2023 91  38 - 126 U/L Final   Total Bilirubin 02/11/2023 1.7 (H)  0.3 - 1.2 mg/dL Final   GFR, Estimated 02/11/2023 >60  >60 mL/min Final   Comment: (NOTE) Calculated using the CKD-EPI Creatinine Equation (2021)    Anion gap 02/11/2023 15  5 - 15 Final   Performed at [facility], [address], [place], [place] [address]   WBC 02/11/2023 9.2  4.0 - 10.5 K/uL Final   RBC 02/11/2023 4.23  3.87 - 5.11 MIL/uL Final   Hemoglobin 02/11/2023 13.4  12.0 - 15.0 g/dL Final   HCT 36/64/4034 38.8  36.0 - 46.0 % Final   MCV 02/11/2023 91.7  80.0 - 100.0 fL Final   MCH 02/11/2023 31.7  26.0 - 34.0 pg Final   MCHC 02/11/2023 34.5  30.0 - 36.0 g/dL Final   RDW 74/25/9563 13.0  11.5 - 15.5 % Final   Platelets 02/11/2023 422 (H)  150 - 400 K/uL Final   nRBC 02/11/2023 0.0  0.0 - 0.2 % Final   Performed at [facility], [address], [place], [place] [address]   Magnesium 02/11/2023 2.0  1.7 - 2.4 mg/dL Final   Performed at [facility], [address], [place], [place] [address]   Phosphorus 02/11/2023 3.7  2.5 - 4.6 mg/dL Final   Performed at [facility], [address] [place] [address], [place], [place] [address]   Vitamin B-12 02/11/2023 252  180 - 914 pg/mL Final   Comment: (NOTE) This assay is not validated for testing neonatal or myeloproliferative syndrome specimens for Vitamin B12 levels. Performed at [facility], [address] [place], [place] [address]    Folate 02/11/2023 6.3  >5.9 ng/mL Final   Performed at [facility], [address] [place], [place] [address]   TSH 02/11/2023 3.115  0.350 - 4.500 uIU/mL Final   Comment: Performed by a 3rd Generation assay with a functional sensitivity of <=0.01 uIU/mL. Performed at [facility], [address], [place], [place] [address]    SARS Coronavirus 2 by RT PCR 02/11/2023 NEGATIVE  NEGATIVE Final   Influenza A by PCR 02/11/2023 NEGATIVE  NEGATIVE Final   Influenza B by PCR 02/11/2023 NEGATIVE  NEGATIVE Final   Comment: (NOTE) The Xpert Xpress SARS-CoV-2/FLU/RSV plus assay is intended as an aid in the diagnosis of influenza from Nasopharyngeal swab specimens and should not be used as a sole basis for treatment. Nasal washings and aspirates are unacceptable for Xpert Xpress SARS-CoV-2/FLU/RSV testing.  Fact Sheet for Patients: BloggerCourse.com  Fact Sheet for Healthcare Providers: SeriousBroker.it  This test is not yet approved or cleared by the [place] FDA and has been authorized for detection and/or diagnosis of SARS-CoV-2 by FDA under an Emergency Use Authorization (EUA). This EUA will remain in effect (meaning this test can be used) for the duration of the COVID-19 declaration under Section 564(b)(1) of the Act, 21 U.S.C. section 360bbb-3(b)(1), unless the authorization is terminated or revoked.     Resp Syncytial Virus by PCR 02/11/2023 NEGATIVE  NEGATIVE Final   Comment: (NOTE) Fact Sheet for Patients: BloggerCourse.com  Fact Sheet for Healthcare Providers: SeriousBroker.it  This test is not yet approved or cleared by the [place] FDA and has been authorized for detection and/or diagnosis of SARS-CoV-2 by FDA under an Emergency Use Authorization (EUA). This EUA will remain in effect (meaning this test can be used) for the duration of the COVID-19 declaration under Section 564(b)(1) of the Act, 21 U.S.C. section 360bbb-3(b)(1), unless the authorization is terminated or revoked.  Performed at [facility], [address] [place], [place] [address]    Potassium 02/14/2023 4.2  3.5 - 5.1 mmol/L Final   Performed at [facility], [address] [place], [place] [address]  Admission on 11/06/2022, Discharged on 11/06/2022  Component Date Value Ref Range Status   WBC 11/06/2022 7.0  4.0 - 10.5 K/uL Final   RBC 11/06/2022 4.23  3.87 - 5.11 MIL/uL Final   Hemoglobin 11/06/2022 13.9  12.0 - 15.0 g/dL Final   HCT 13/07/6577 39.7  36.0 - 46.0 % Final   MCV 11/06/2022 93.9  80.0 - 100.0 fL Final   MCH 11/06/2022 32.9  26.0 - 34.0 pg Final   MCHC 11/06/2022 35.0  30.0 - 36.0 g/dL Final   RDW 46/96/2952 12.6  11.5 - 15.5 % Final   Platelets 11/06/2022 314  150 - 400 K/uL Final   nRBC 11/06/2022 0.0  0.0 - 0.2 % Final   Neutrophils Relative % 11/06/2022 64  % Final   Neutro Abs 11/06/2022 4.5  1.7 - 7.7 K/uL Final   Lymphocytes Relative 11/06/2022 25  % Final   Lymphs Abs 11/06/2022 1.8  0.7 - 4.0 K/uL Final   Monocytes Relative 11/06/2022 7  % Final   Monocytes Absolute 11/06/2022 0.5  0.1 - 1.0 K/uL Final   Eosinophils Relative 11/06/2022 1  % Final   Eosinophils Absolute 11/06/2022 0.1  0.0 - 0.5 K/uL Final   Basophils Relative 11/06/2022 2  % Final   Basophils Absolute  11/06/2022 0.1  0.0 - 0.1 K/uL Final   Immature Granulocytes 11/06/2022 1  % Final   Abs Immature Granulocytes 11/06/2022 0.04  0.00 - 0.07 K/uL Final   Performed at [facility], [address] [place], [place] [address]   Sodium 11/06/2022 137  135 - 145 mmol/L Final   Potassium 11/06/2022 3.0 (L)  3.5 - 5.1 mmol/L Final   Chloride 11/06/2022 100  98 - 111 mmol/L Final   CO2 11/06/2022 21 (L)  22 - 32 mmol/L Final   Glucose, Bld 11/06/2022 117 (H)  70 - 99 mg/dL Final   Glucose reference range applies only to samples taken after fasting for at least 8 hours.   BUN 11/06/2022 13  8 - 23 mg/dL Final   Creatinine, Ser 11/06/2022 0.48  0.44 - 1.00 mg/dL Final   Calcium 60/45/4098 9.4  8.9 - 10.3 mg/dL Final   Total Protein 11/91/4782 7.6  6.5 - 8.1 g/dL Final   Albumin 95/62/1308 4.3  3.5 - 5.0 g/dL Final   AST  65/78/4696 45 (H)  15 - 41 U/L Final   ALT 11/06/2022 38  0 - 44 U/L Final   Alkaline Phosphatase 11/06/2022 74  38 - 126 U/L Final   Total Bilirubin 11/06/2022 1.6 (H)  0.3 - 1.2 mg/dL Final   GFR, Estimated 11/06/2022 >60  >60 mL/min Final   Comment: (NOTE) Calculated using the CKD-EPI Creatinine Equation (2021)    Anion gap 11/06/2022 16 (H)  5 - 15 Final   Performed at [facility], [address], [place], [place] [address]   Alcohol, Ethyl (B) 11/06/2022 54 (H)  <10 mg/dL Final   Comment: (NOTE) Lowest detectable limit for serum alcohol is 10 mg/dL.  For medical purposes only. Performed at [facility], [address], [place], [place] [address]     Blood Alcohol level:  Lab Results  Component Value Date   ETH <10 03/19/2023   ETH 72 (H) 02/11/2023    Metabolic Disorder Labs: No results found for: "HGBA1C", "MPG" No results found for: "PROLACTIN" No results found for: "CHOL", "TRIG", "HDL", "CHOLHDL", "VLDL", "LDLCALC"  Therapeutic Lab Levels: No results found for: "LITHIUM" No results found for: "VALPROATE" No results found for: "CBMZ"  Physical Findings   PHQ2-9    Flowsheet Row ED from 03/19/2023 in [facility] ED from 02/11/2023 in [facility]  PHQ-2 Total Score 2 1  PHQ-9 Total Score 5 4      Flowsheet Row ED from 03/19/2023 in [facility] ED from 02/11/2023 in [facility] ED from 11/06/2022 in [facility] Emergency Department at [facility]  C-SSRS RISK CATEGORY No Risk No Risk No Risk        Musculoskeletal  Strength & Muscle Tone: decreased Gait & Station:  more steady still small gait Patient leans: N/A  Psychiatric Specialty Exam  Presentation  General Appearance:  Appropriate for Environment; Casual  Eye Contact: Good  Speech: Clear and Coherent; Normal Rate  Speech  Volume: Normal  Handedness: Right   Mood and Affect  Mood: Depressed  Affect: Congruent; Depressed   Thought Process  Thought Processes: Coherent; Goal Directed  Descriptions of Associations:Circumstantial  Orientation:Full (Time, Place and Person)  Thought Content:Rumination  Diagnosis of Schizophrenia or Schizoaffective disorder in past: No    Hallucinations:Hallucinations: None   Ideas of Reference:None  Suicidal Thoughts:Suicidal Thoughts: No   Homicidal  Thoughts:Homicidal Thoughts: No    Sensorium  Memory: Immediate Fair; Recent Fair  Judgment: Poor  Insight: Poor; Lacking; Shallow   Executive Functions  Concentration: Fair  Attention Span: Fair  Recall: [employer] of Knowledge: Fair  Language: Fair   Psychomotor Activity  Psychomotor Activity: Psychomotor Activity: Normal    Assets  Assets: Communication Skills; Desire for Improvement; Housing; Leisure Time; Physical Health; Social Support; Vocational/Educational   Sleep  Sleep: Sleep: Good     Physical Exam  Physical Exam HENT:     Head: Normocephalic and atraumatic.  Pulmonary:     Effort: Pulmonary effort is normal.  Neurological:     Mental Status: She is alert and oriented to person, place, and time.    Review of Systems  Gastrointestinal:  Positive for diarrhea.  Genitourinary:  Positive for urgency. Negative for dysuria.  Psychiatric/Behavioral:  Negative for hallucinations and suicidal ideas. The patient does not have insomnia.    Blood pressure (!) 150/85, pulse 91, temperature 98.7 F (37.1 C), temperature source Oral, resp. rate 18, height  (1.676 m), weight 147 lb (66.7 kg), SpO2 100 %. Body mass index is 23.73 kg/m.  Treatment Plan Summary: Daily contact with patient to assess and evaluate symptoms and progress in treatment and Medication management   Based on assessment today patient continues to be somewhat interested in rehab, but  quickly changes her mind. Her motivation appears to be desiring for her husband to pay a large amount for her rehabilitation, as she later reports preference for [facility] instead of [address], despite extensive discussion about the latter. Insight continues to be poor regarding her alcohol consumption. She also associates her loneliness with increased Etoh and recognizes that being alone right now is not in her best interest, and this is improvement. However, this fluctuates day by day. She continues to perseverate on her families flaws and struggles with introspection, and has VERY poor insight into this. Do hope that continued nutrition and UTI treatment may help with some of patient's cognition.   Etoh use d/o Hx of complicated withdrawal from BZDs Hx of BZD delirium MDD GAD - Continue Zoloft  daily - START Remeron 7.5 mg qHS for depressive symptom adjunct and sleep - Continue Buspar  BID CIWA Librium protocol completed, with the following to be continued: -Loperamide 2 to 4 mg oral as needed/diarrhea or loose stools -Multivitamin with minerals 1 tablet daily -Ondansetron disintegrating tablet 4 mg every 6 as needed/nausea or vomiting -Thiamine tablet 250 mg daily   Hypokalmeia, resolved Hyponatremia, resolved Diarrhea, improving - Recommend no more than 1 cup of coffee/ day - Imodium PRN   Hyperbilirubinemia, resolved - Likely 2/2 to Etoh use   HTN - Continue amlodipine  daily   UTI Diarrhea - Probiotics - keflex  BID for 5 days   Dispo: Pending, awaiting [facility] confirmation.  PGY-2 Lamar Sprinkles, MD 03/24/2023 11:26 AM

## 2023-03-24 NOTE — ED Notes (Signed)
Patient is sleeping. Respirations equal and unlabored, skin warm and dry. No change in assessment or acuity. Routine safety checks conducted according to facility protocol. Will continue to monitor for safety.   

## 2023-03-25 DIAGNOSIS — F332 Major depressive disorder, recurrent severe without psychotic features: Secondary | ICD-10-CM | POA: Diagnosis not present

## 2023-03-25 DIAGNOSIS — F1090 Alcohol use, unspecified, uncomplicated: Secondary | ICD-10-CM | POA: Diagnosis not present

## 2023-03-25 NOTE — Group Note (Signed)
Group Topic: Fears and Unhealthy Coping Skills  Group Date: 03/25/2023 Start Time: 1000 End Time: 1130 Facilitators: Cristal Ford  Department: [facility]  Number of Participants: 5  Group Focus: feeling awareness/expression Treatment Modality:  Psychoeducation Interventions utilized were mental fitness and problem solving Purpose: express feelings  Name: Stephanie Riley Date of Birth: 12-28-1950  MR: [id number]    Level of Participation: active Quality of Participation: attentive and cooperative Interactions with others: gave feedback Mood/Affect: positive Triggers (if applicable): na Cognition: coherent/clear Progress: Moderate Response: na Plan: follow-up needed  Patients Problems:  Patient Active Problem List   Diagnosis Date Noted   Alcohol use disorder 03/19/2023   Alcohol use disorder, moderate, dependence 02/12/2023   Benzodiazepine withdrawal without complication 02/12/2023

## 2023-03-25 NOTE — ED Notes (Signed)
Pt is currently sleeping, no distress noted, environmental check complete, will continue to monitor patient for safety.  

## 2023-03-25 NOTE — ED Notes (Signed)
Pt is in the Dayroom with other patients doing some colorings.Respirations are even and unlabored. No acute distress noted. Will continue to monitor for safety.  

## 2023-03-25 NOTE — ED Notes (Signed)
Patient observed/assessed in room in bed appearing in no immediate distress resting peacefully. Q15 minute checks continued by MHT and nursing staff. Will continue to monitor and support. 

## 2023-03-25 NOTE — Care Management (Signed)
[facility] Care Management   Writer spoke with the patient's husband and discussed possible placement arrangements for the patient.  Patient's husband reports that the patient does not want to go to [place].  Writer was able to find another facility in [place] that accepts [employer].  The name of the facility is [facility].   The phone number is [phone].     Writer faxed the referral to [facility].  Writer spoke to the intake worker at [facility] Drucilla Schmidt [phone]).  Per Drucilla Schmidt, patient has been tentatively accepted to the facility on Apr 07, 2023.    Writer provided the tentative admission acceptance information for [facility] to the patient husband.    Per Dr. Alfonse Flavors the patient will be discharging tomorrow Friday 03-25-2025 at 10am.  Patient's husband reports that he will be able to pick up the patient.   Patient husband reports that he has identified family members that the patient is able to stay with so that she will have care 24-hours a day until she she is placed at the [facility] in [place].

## 2023-03-25 NOTE — ED Notes (Signed)
Patient awake and alert interacting with staff and peers.  She has been expressing her feelings, thoughts and fears around the status of her marriage, her life in general and ways to make positive changes.  She has been having trouble accepting the separation between herself and her husband and was given the opportunity to express herself and her feelings about it.  She spoke to her husband on the phone and told him she wanted him to pay for treatment.  Apparently the conversation did not go well according to patient.  Patient states that she will "need someone to stay with me when I leave" because "I don't do well on my own."  Husband may be open to hiring an aide for her until she gets into long term rehab.  Encouraged patient to continue to process feelings.  Will monitor and provide support.

## 2023-03-25 NOTE — ED Notes (Signed)
Patient remains asleep in bed without distress or complaint.  No evidence of withdrawal.  Will monitor and provide a safe environment.

## 2023-03-26 DIAGNOSIS — F1090 Alcohol use, unspecified, uncomplicated: Secondary | ICD-10-CM | POA: Diagnosis not present

## 2023-03-26 DIAGNOSIS — F332 Major depressive disorder, recurrent severe without psychotic features: Secondary | ICD-10-CM | POA: Diagnosis not present

## 2023-03-26 MED ORDER — HYDROXYZINE HCL 25 MG PO TABS: 25.0000 mg | ORAL_TABLET | Freq: Two times a day (BID) | ORAL | 0 refills | Status: DC | PRN

## 2023-03-26 MED ORDER — SACCHAROMYCES BOULARDII 250 MG PO CAPS: 250.0000 mg | ORAL_CAPSULE | Freq: Every day | ORAL | 0 refills | Status: AC

## 2023-03-26 MED ORDER — SERTRALINE HCL 100 MG PO TABS: 100.0000 mg | ORAL_TABLET | Freq: Every day | ORAL | 0 refills | Status: DC

## 2023-03-26 MED ORDER — AMLODIPINE BESYLATE 10 MG PO TABS: 10.0000 mg | ORAL_TABLET | Freq: Every day | ORAL | 0 refills | Status: DC

## 2023-03-26 MED ORDER — MIRTAZAPINE 7.5 MG PO TABS: 7.5000 mg | ORAL_TABLET | Freq: Every day | ORAL | 0 refills | Status: DC

## 2023-03-26 NOTE — ED Provider Notes (Signed)
FBC/OBS ASAP Discharge Summary  Date and Time: 03/26/2023 7:08 AM  Name: Stephanie Riley  MRN:  [id number]   Discharge Diagnoses:  Final diagnoses:  Admits to alcohol use    Subjective: Stephanie Riley is a 72 yo patient with a PPH of MDD, Etoh use disorder, self-reported ADHD, anxiety, and chronic prescribed BZD use.     She presented to [facility] and then [facility] at the request of provider for Etoh detox in the setting of hx complicated withdrawal from BZDs.  Stay Summary: The patient was evaluated each day by a clinical provider to ascertain response to treatment. Improvement was noted by the patient's report of decreasing symptoms, improved sleep and appetite, affect, medication tolerance, behavior, and participation in unit programming.  Patient was asked each day to complete a self inventory noting mood, mental status, pain, new symptoms, anxiety and concerns.   Patient responded well to medication and being in a therapeutic and supportive environment. Positive and appropriate behavior was noted and the patient was motivated for recovery. The patient worked closely with the treatment team and case manager to develop a discharge plan with appropriate goals. Coping skills, problem solving as well as relaxation therapies were also part of the unit programming.   By the day of discharge patient was in much improved condition than upon admission.  Symptoms were reported as significantly decreased or resolved completely. The patient denied SI/HI and voiced no AVH. The patient was motivated to continue taking medication with a goal of continued improvement in mental health.    Collateral: Spoke to husband, Luan Pulling, on day of discharge to confirm discharge plan from previous day. Patient still to be picked up at 10 AM. Mentioned patient's discussion with another patient about moving into their home upon discharge. Rich assured that the other patient would not be discharged at the same time and that many members  of our team have had discussions with his wife about how that was not a good idea and could potentially be dangerous.  Spoke to patient with RN, Michail Sermon and discussed the agreement signed on admission in which patient was not to "sell, trade, barter, or give away personal items" which included access to her home. Patient voiced understanding. As well, she was told that she would not be able to discharge at 12 to take an Benedetto Goad with the other patient to her home. Rather, she would be discharging at 10 with her husband, as discussed yesterday.  Total Time spent with patient: 30 minutes  Past Psychiatric History:  OPT: Hx of Adderall, Zoloft, Trazodone, Wellbutrin, Xanax, Klonopin, no psychiatry opt 12/2022- INPT at [facility] , patient noted to have yellow/orange sclera on arrival, but cleared by discharge. Dx with Etoh use d/o,. MOCA was done due to cognitive concerns, scored 23/30. 2nd hosp in 2024- [facility], BZD withdrawal Dx: MDD, Etoh use disorder, ADHD, anxiety, and chronic prescribed BZD use. Therapy: marriage counseling Past Medical History: HTN Family History: Both parents had Alzhmier's  Family Psychiatric  History: na Social History: -Living alone, separated from husband -Daughter in [place] - was a Runner, broadcasting/film/video - enjoyed working out at least 1 year ago     Additional Social History:  Pain Medications: See [facility] Prescriptions: See MAR Over the Counter: See MAR History of alcohol / drug use?: Yes Longest period of sobriety (when/how long): Unknown Negative Consequences of Use: Personal relationships Withdrawal Symptoms: Agitation, Irritability Name of Substance 1: Alcohol 1 - Age of First Use: 17 1 - Amount (size/oz):  Pt reports amounts vary from 1 to 3 glasses of wine daily 1 - Frequency: Daily 1 - Duration: Ongoing 1 - Last Use / Amount: Last 24 hours patient reports "a couple glasses of wine." 1 - Method of Aquiring: Legally 1- Route of Use: Oral Tobacco Cessation:  N/A, patient does  not currently use tobacco products  Current Medications:  Current Facility-Administered Medications  Medication Dose Route Frequency Provider Last Rate Last Admin   acetaminophen (TYLENOL) tablet 650 mg  650 mg Oral Q6H PRN Lenard Lance, FNP       alum & mag hydroxide-simeth (MAALOX/MYLANTA) 200-200-20 MG/5ML suspension 30 mL  30 mL Oral Q4H PRN Lenard Lance, FNP       amLODipine (NORVASC) tablet 10 mg  10 mg Oral Daily Lenard Lance, FNP   10 mg at 03/25/23 0935   busPIRone (BUSPAR) tablet 5 mg  5 mg Oral BID Kizzie Ide B, MD   5 mg at 03/25/23 2113   cyanocobalamin (VITAMIN B12) tablet 1,000 mcg  1,000 mcg Oral Daily Kizzie Ide B, MD   1,000 mcg at 03/25/23 0935   losartan (COZAAR) tablet 100 mg  100 mg Oral Daily Nelly Rout, MD   100 mg at 03/25/23 1610   And   hydrochlorothiazide (HYDRODIURIL) tablet 12.5 mg  12.5 mg Oral Daily Nelly Rout, MD   12.5 mg at 03/25/23 0935   hydrOXYzine (ATARAX) tablet 25 mg  25 mg Oral BID PRN Herby Abraham, RPH   25 mg at 03/25/23 1423   loperamide (IMODIUM) capsule 2-4 mg  2-4 mg Oral PRN Lamar Sprinkles, MD       magnesium hydroxide (MILK OF MAGNESIA) suspension 30 mL  30 mL Oral Daily PRN Lenard Lance, FNP       mirtazapine (REMERON) tablet 7.5 mg  7.5 mg Oral QHS Lamar Sprinkles, MD   7.5 mg at 03/25/23 2113   multivitamin with minerals tablet 1 tablet  1 tablet Oral Daily Lenard Lance, FNP   1 tablet at 03/25/23 0935   ondansetron (ZOFRAN-ODT) disintegrating tablet 4 mg  4 mg Oral Q8H PRN Lamar Sprinkles, MD   4 mg at 03/24/23 1041   saccharomyces boulardii (FLORASTOR) capsule 250 mg  250 mg Oral Daily Eliseo Gum B, MD   250 mg at 03/25/23 0935   sertraline (ZOLOFT) tablet 100 mg  100 mg Oral Daily Kizzie Ide B, MD   100 mg at 03/25/23 0935   thiamine (VITAMIN B1) tablet 250 mg  250 mg Oral Daily Eliseo Gum B, MD   250 mg at 03/25/23 0935   traZODone (DESYREL) tablet 100 mg  100 mg Oral QHS Lenard Lance, FNP   100 mg at  03/25/23 2113   Current Outpatient Medications  Medication Sig Dispense Refill   buPROPion (WELLBUTRIN XL) 150 MG 24 hr tablet Take 150 mg by mouth every morning.     busPIRone (BUSPAR) 5 MG tablet Take 5 mg by mouth 2 (two) times daily.     Cyanocobalamin (VITAMIN B-12 PO) Take 1 tablet by mouth daily.     hydrOXYzine (VISTARIL) 25 MG capsule Take 1 capsule (25 mg total) by mouth 2 (two) times daily as needed for anxiety. 14 capsule 0   losartan-hydrochlorothiazide (HYZAAR) 100-12.5 MG tablet Take 1 tablet by mouth daily.     naltrexone (DEPADE) 50 MG tablet Take 50 mg by mouth daily.     sertraline (ZOLOFT) 100 MG tablet Take 2 tablets (200  mg total) by mouth daily. (Patient taking differently: Take 200 mg by mouth at bedtime.) 60 tablet 0   traZODone (DESYREL) 100 MG tablet Take 100 mg by mouth at bedtime.      PTA Medications:  Facility Ordered Medications  Medication   acetaminophen (TYLENOL) tablet 650 mg   alum & mag hydroxide-simeth (MAALOX/MYLANTA) 200-200-20 MG/5ML suspension 30 mL   magnesium hydroxide (MILK OF MAGNESIA) suspension 30 mL   amLODipine (NORVASC) tablet 10 mg   traZODone (DESYREL) tablet 100 mg   multivitamin with minerals tablet 1 tablet   [EXPIRED] chlordiazePOXIDE (LIBRIUM) capsule 25 mg   [EXPIRED] loperamide (IMODIUM) capsule 2-4 mg   [EXPIRED] ondansetron (ZOFRAN-ODT) disintegrating tablet 4 mg   losartan (COZAAR) tablet 100 mg   And   hydrochlorothiazide (HYDRODIURIL) tablet 12.5 mg   busPIRone (BUSPAR) tablet 5 mg   sertraline (ZOLOFT) tablet 100 mg   [COMPLETED] chlordiazePOXIDE (LIBRIUM) capsule 25 mg   Followed by   [COMPLETED] chlordiazePOXIDE (LIBRIUM) capsule 25 mg   Followed by   [COMPLETED] chlordiazePOXIDE (LIBRIUM) capsule 25 mg   Followed by   [COMPLETED] chlordiazePOXIDE (LIBRIUM) capsule 25 mg   cyanocobalamin (VITAMIN B12) tablet 1,000 mcg   [COMPLETED] potassium chloride SA (KLOR-CON M) CR tablet 40 mEq   [COMPLETED] cephALEXin  (KEFLEX) capsule 500 mg   saccharomyces boulardii (FLORASTOR) capsule 250 mg   [COMPLETED] thiamine (VITAMIN B1) tablet 500 mg   thiamine (VITAMIN B1) tablet 250 mg   hydrOXYzine (ATARAX) tablet 25 mg   ondansetron (ZOFRAN-ODT) disintegrating tablet 4 mg   loperamide (IMODIUM) capsule 2-4 mg   mirtazapine (REMERON) tablet 7.5 mg   PTA Medications  Medication Sig   losartan-hydrochlorothiazide (HYZAAR) 100-12.5 MG tablet Take 1 tablet by mouth daily.   traZODone (DESYREL) 100 MG tablet Take 100 mg by mouth at bedtime.   sertraline (ZOLOFT) 100 MG tablet Take 2 tablets (200 mg total) by mouth daily. (Patient taking differently: Take 200 mg by mouth at bedtime.)   buPROPion (WELLBUTRIN XL) 150 MG 24 hr tablet Take 150 mg by mouth every morning.   naltrexone (DEPADE) 50 MG tablet Take 50 mg by mouth daily.   [EXPIRED] ondansetron (ZOFRAN) 4 MG tablet Take 4 mg by mouth every 8 (eight) hours as needed for nausea or vomiting.   busPIRone (BUSPAR) 5 MG tablet Take 5 mg by mouth 2 (two) times daily.       03/23/2023    5:02 PM 03/21/2023   10:51 AM 03/19/2023    5:31 PM  Depression screen PHQ 2/9  Decreased Interest Down, Depressed, Hopeless PHQ - 2 Score Altered sleeping 0 0 1  Tired, decreased energy Change in appetite 0 0 3  Feeling bad or failure about yourself  0 0 1  Trouble concentrating Moving slowly or fidgety/restless 0 0 0  Suicidal thoughts 0 0 0  PHQ-9 Score Difficult doing work/chores  Somewhat difficult Very difficult    Flowsheet Row ED from 03/[facility]/20[facility] from 02/11/2023 in [facility] ED from 11/06/2022 in [facility] Emergency Department at [facility]  C-SSRS RISK CATEGORY No Risk No Risk No Risk       Musculoskeletal  Strength & Muscle Tone: within normal limits Gait & Station:  more steady than previous day Patient leans:  N/A  Psychiatric Specialty Exam  Presentation  General Appearance:  Appropriate for Environment; Casual  Eye Contact: Good  Speech: Clear and Coherent; Normal Rate  Speech Volume: Normal  Handedness: Right   Mood and Affect  Mood: Depressed  Affect: Appropriate; Non-Congruent   Thought Process  Thought Processes: Coherent; Goal Directed  Descriptions of Associations:Intact  Orientation:Full (Time, Place and Person)  Thought Content:Rumination  Diagnosis of Schizophrenia or Schizoaffective disorder in past: No    Hallucinations:Hallucinations: None  Ideas of Reference:None  Suicidal Thoughts:Suicidal Thoughts: No  Homicidal Thoughts:Homicidal Thoughts: No   Sensorium  Memory: Immediate Fair; Recent Fair  Judgment: Poor  Insight: Lacking; Shallow   Executive Functions  Concentration: Fair  Attention Span: Fair  Recall: [employer] of Knowledge: Fair  Language: Good   Psychomotor Activity  Psychomotor Activity: Psychomotor Activity: Normal   Assets  Assets: Communication Skills; Desire for Improvement; Social Support; Health and safety inspector; Housing; Intimacy; Leisure Time   Sleep  Sleep: Sleep: Good  Physical Exam  Physical Exam HENT:     Head: Normocephalic and atraumatic.  Pulmonary:     Effort: Pulmonary effort is normal.  Neurological:     Mental Status: She is alert and oriented to person, place, and time. Review of Systems  Gastrointestinal:  Negative for abdominal pain, diarrhea and nausea.  Musculoskeletal:  Negative for myalgias.  Neurological:  Negative for dizziness and tremors.   Blood pressure 138/80, pulse 93, temperature 98.3 F (36.8 C), temperature source Oral, resp. rate 16, height  (1.676 m), weight 147 lb (66.7 kg), SpO2 99 %. Body mass index is 23.73 kg/m.  Demographic Factors:  Age 72 or older, Caucasian, Living alone, and Unemployed  Loss Factors: Decrease in vocational  status, Loss of significant relationship, and Decline in physical health  Historical Factors: Impulsivity  Risk Reduction Factors:   Sense of responsibility to family, Positive social support, and Positive therapeutic relationship  Continued Clinical Symptoms:  Depression:   Comorbid alcohol abuse/dependence Delusional Impulsivity Alcohol/Substance Abuse/Dependencies Previous Psychiatric Diagnoses and Treatments Medical Diagnoses and Treatments/Surgeries  Cognitive Features That Contribute To Risk:  Polarized thinking    Suicide Risk:  Mild: There are no identifiable plans, no associated intent, mild dysphoria and related symptoms, good self-control (both objective and subjective assessment), few other risk factors, and identifiable protective factors, including available and accessible social support.  Plan Of Care/Follow-up recommendations:  Follow-up recommendations:  Activity:  Normal, as tolerated Diet:  Per PCP recommendation  Patient is instructed prior to discharge to: Take all medications as prescribed by her mental healthcare provider. Report any adverse effects and/or reactions from the medicines to her outpatient provider promptly. Patient has been instructed & cautioned: To not engage in alcohol and or illegal drug use while on prescription medicines.  In the event of worsening symptoms, patient is instructed to call the crisis hotline at 988, 911 and or go to the nearest ED for appropriate evaluation and treatment of symptoms. To follow-up with her primary care provider for your other medical issues, concerns and or health care needs.   Disposition: Home with husband while awaiting bed at [facility].  Lamar Sprinkles, MD 03/26/2023, 7:08 AM

## 2023-03-26 NOTE — ED Notes (Signed)
Pt attended group 

## 2023-03-26 NOTE — ED Notes (Signed)
Pt is currently sleeping, no distress noted, environmental check complete, will continue to monitor patient for safety.  

## 2023-03-26 NOTE — ED Provider Notes (Signed)
Behavioral Health Progress Note  Date and Time: 03/25/2023   11:47 AM Name: Stephanie Riley MRN:  [id number]  Subjective:  Stephanie Riley is a 72 yo patient with a PPH of MDD, Etoh use disorder, self-reported ADHD, anxiety, and chronic prescribed BZD use.  Current regimen:   Zoloft 100 mg daily BuSpar 5 mg twice daily (started/10/2023) Trazodone 100-150 mg nightly Librium Taper   She presented to [facility] and then [facility] at the request of provider for Etoh detox in the setting of hx complicated withdrawal from BZDs.   On assessment today patient reports that she continues to feel down due to the separation from her husband. We discuss at length residential treatment options with [facility]. She is advised to speak with her husband about the best option moving forward. After their conversation, it is decided that patient will go to [facility], and her husband will assist in arranging for patient to stay with family members under supervision until her bed is opened. This is confirmed in phone call with husband, Stephanie Riley. Patient denies somatic concerns or complaints today. She reports that she is sleeping fairly, and her appetite is intact. She denies HI, SI, and AVH.   Diagnosis:  Final diagnoses:  Admits to alcohol use    Total Time spent with patient: 30 minutes  Past Psychiatric History:  OPT: Hx of Adderall, Zoloft, Trazodone, Wellbutrin, Xanax, Klonopin, no psychiatry opt 12/2022- INPT at [facility] , patient noted to have yellow/orange sclera on arrival, but cleared by discharge. Dx with Etoh use d/o,. MOCA was done due to cognitive concerns, scored 23/30. 2nd hosp in 2024- [facility], BZD withdrawal Dx: MDD, Etoh use disorder, ADHD, anxiety, and chronic prescribed BZD use. Therapy: marriage counseling Past Medical History: HTN Family History: Both parents had Alzhmier's  Family Psychiatric  History: na Social History: -Living alone, separated from husband -Daughter in [place] - was a Runner, broadcasting/film/video -  enjoyed working out at least 1 year ago    Additional Social History:    Pain Medications: See [facility] Prescriptions: See MAR Over the Counter: See MAR History of alcohol / drug use?: Yes Longest period of sobriety (when/how long): Unknown Negative Consequences of Use: Personal relationships Withdrawal Symptoms: Agitation, Irritability Name of Substance 1: Alcohol 1 - Age of First Use: 17 1 - Amount (size/oz): Pt reports amounts vary from 1 to 3 glasses of wine daily 1 - Frequency: Daily 1 - Duration: Ongoing 1 - Last Use / Amount: Last 24 hours patient reports "a couple glasses of wine." 1 - Method of Aquiring: Legally 1- Route of Use: Oral                  Sleep: Good  Appetite:  Fair  Current Medications:  Current Facility-Administered Medications  Medication Dose Route Frequency Provider Last Rate Last Admin   acetaminophen (TYLENOL) tablet 650 mg  650 mg Oral Q6H PRN Lenard Lance, FNP       alum & mag hydroxide-simeth (MAALOX/MYLANTA) 200-200-20 MG/5ML suspension 30 mL  30 mL Oral Q4H PRN Lenard Lance, FNP       amLODipine (NORVASC) tablet 10 mg  10 mg Oral Daily Lenard Lance, FNP   10 mg at 03/25/23 0935   busPIRone (BUSPAR) tablet 5 mg  5 mg Oral BID Kizzie Ide B, MD   5 mg at 03/25/23 2113   cyanocobalamin (VITAMIN B12) tablet 1,000 mcg  1,000 mcg Oral Daily Lance Muss, MD   1,000 mcg  at 03/25/23 0935   losartan (COZAAR) tablet 100 mg  100 mg Oral Daily Nelly Rout, MD   100 mg at 03/25/23 4098   And   hydrochlorothiazide (HYDRODIURIL) tablet 12.5 mg  12.5 mg Oral Daily Nelly Rout, MD   12.5 mg at 03/25/23 0935   hydrOXYzine (ATARAX) tablet 25 mg  25 mg Oral BID PRN Herby Abraham, RPH   25 mg at 03/25/23 1423   loperamide (IMODIUM) capsule 2-4 mg  2-4 mg Oral PRN Lamar Sprinkles, MD       magnesium hydroxide (MILK OF MAGNESIA) suspension 30 mL  30 mL Oral Daily PRN Lenard Lance, FNP       mirtazapine (REMERON) tablet 7.5 mg  7.5 mg Oral QHS  Lamar Sprinkles, MD   7.5 mg at 03/25/23 2113   multivitamin with minerals tablet 1 tablet  1 tablet Oral Daily Lenard Lance, FNP   1 tablet at 03/25/23 0935   ondansetron (ZOFRAN-ODT) disintegrating tablet 4 mg  4 mg Oral Q8H PRN Lamar Sprinkles, MD   4 mg at 03/24/23 1041   saccharomyces boulardii (FLORASTOR) capsule 250 mg  250 mg Oral Daily Eliseo Gum B, MD   250 mg at 03/25/23 0935   sertraline (ZOLOFT) tablet 100 mg  100 mg Oral Daily Kizzie Ide B, MD   100 mg at 03/25/23 0935   thiamine (VITAMIN B1) tablet 250 mg  250 mg Oral Daily Eliseo Gum B, MD   250 mg at 03/25/23 0935   traZODone (DESYREL) tablet 100 mg  100 mg Oral QHS Lenard Lance, FNP   100 mg at 03/25/23 2113   Current Outpatient Medications  Medication Sig Dispense Refill   buPROPion (WELLBUTRIN XL) 150 MG 24 hr tablet Take 150 mg by mouth every morning.     busPIRone (BUSPAR) 5 MG tablet Take 5 mg by mouth 2 (two) times daily.     Cyanocobalamin (VITAMIN B-12 PO) Take 1 tablet by mouth daily.     hydrOXYzine (VISTARIL) 25 MG capsule Take 1 capsule (25 mg total) by mouth 2 (two) times daily as needed for anxiety. 14 capsule 0   losartan-hydrochlorothiazide (HYZAAR) 100-12.5 MG tablet Take 1 tablet by mouth daily.     naltrexone (DEPADE) 50 MG tablet Take 50 mg by mouth daily.     sertraline (ZOLOFT) 100 MG tablet Take 2 tablets (200 mg total) by mouth daily. (Patient taking differently: Take 200 mg by mouth at bedtime.) 60 tablet 0   traZODone (DESYREL) 100 MG tablet Take 100 mg by mouth at bedtime.      Labs  Lab Results:  Admission on 03/19/2023  Component Date Value Ref Range Status   SARS Coronavirus 2 by RT PCR 03/19/2023 NEGATIVE  NEGATIVE Final   Performed at [facility], [address] [place], [place] [address]   WBC 03/19/2023 9.3  4.0 - 10.5 K/uL Final   RBC 03/19/2023 3.87  3.87 - 5.11 MIL/uL Final   Hemoglobin 03/19/2023 11.8 (L)  12.0 - 15.0 g/dL Final   HCT 78/29/5621 35.3 (L)  36.0 -  46.0 % Final   MCV 03/19/2023 91.2  80.0 - 100.0 fL Final   MCH 03/19/2023 30.5  26.0 - 34.0 pg Final   MCHC 03/19/2023 33.4  30.0 - 36.0 g/dL Final   RDW 30/86/5784 15.4  11.5 - 15.5 % Final   Platelets 03/19/2023 181  150 - 400 K/uL Final   nRBC 03/19/2023 0.0  0.0 -  0.2 % Final   Neutrophils Relative % 03/19/2023 73  % Final   Neutro Abs 03/19/2023 6.8  1.7 - 7.7 K/uL Final   Lymphocytes Relative 03/19/2023 15  % Final   Lymphs Abs 03/19/2023 1.4  0.7 - 4.0 K/uL Final   Monocytes Relative 03/19/2023 10  % Final   Monocytes Absolute 03/19/2023 0.9  0.1 - 1.0 K/uL Final   Eosinophils Relative 03/19/2023 0  % Final   Eosinophils Absolute 03/19/2023 0.0  0.0 - 0.5 K/uL Final   Basophils Relative 03/19/2023 1  % Final   Basophils Absolute 03/19/2023 0.1  0.0 - 0.1 K/uL Final   Immature Granulocytes 03/19/2023 1  % Final   Abs Immature Granulocytes 03/19/2023 0.06  0.00 - 0.07 K/uL Final   Performed at [facility], [address] [place], [place] [address]   Sodium 03/19/2023 132 (L)  135 - 145 mmol/L Final   Potassium 03/19/2023 2.8 (L)  3.5 - 5.1 mmol/L Final   Chloride 03/19/2023 95 (L)  98 - 111 mmol/L Final   CO2 03/19/2023 24  22 - 32 mmol/L Final   Glucose, Bld 03/19/2023 97  70 - 99 mg/dL Final   Glucose reference range applies only to samples taken after fasting for at least 8 hours.   BUN 03/19/2023 21  8 - 23 mg/dL Final   Creatinine, Ser 03/19/2023 0.95  0.44 - 1.00 mg/dL Final   Calcium 30/86/5784 9.0  8.9 - 10.3 mg/dL Final   Total Protein 69/62/9528 7.1  6.5 - 8.1 g/dL Final   Albumin 41/32/4401 4.5  3.5 - 5.0 g/dL Final   AST 02/72/5366 37  15 - 41 U/L Final   ALT 03/19/2023 29  0 - 44 U/L Final   Alkaline Phosphatase 03/19/2023 81  38 - 126 U/L Final   Total Bilirubin 03/19/2023 2.9 (H)  0.3 - 1.2 mg/dL Final   GFR, Estimated 03/19/2023 >60  >60 mL/min Final   Comment: (NOTE) Calculated using the CKD-EPI Creatinine Equation (2021)    Anion gap 03/19/2023  13  5 - 15 Final   Performed at [facility], [address], [place], [place] [address]   Alcohol, Ethyl (B) 03/19/2023 <10  <10 mg/dL Final   Comment: (NOTE) Lowest detectable limit for serum alcohol is 10 mg/dL.  For medical purposes only. Performed at [facility], [address], [place], [place] [address]    POC Amphetamine UR 03/21/2023 None Detected  NONE DETECTED (Cut Off Level 1000 ng/mL) Final   POC Secobarbital (BAR) 03/21/2023 None Detected  NONE DETECTED (Cut Off Level 300 ng/mL) Final   POC Buprenorphine (BUP) 03/21/2023 None Detected  NONE DETECTED (Cut Off Level 10 ng/mL) Final   POC Oxazepam (BZO) 03/21/2023 Positive (A)  NONE DETECTED (Cut Off Level 300 ng/mL) Final   POC Cocaine UR 03/21/2023 None Detected  NONE DETECTED (Cut Off Level 300 ng/mL) Final   POC Methamphetamine UR 03/21/2023 None Detected  NONE DETECTED (Cut Off Level 1000 ng/mL) Final   POC Morphine 03/21/2023 None Detected  NONE DETECTED (Cut Off Level 300 ng/mL) Final   POC Methadone UR 03/21/2023 None Detected  NONE DETECTED (Cut Off Level 300 ng/mL) Final   POC Oxycodone UR 03/21/2023 None Detected  NONE DETECTED (Cut Off Level 100 ng/mL) Final   POC Marijuana UR 03/21/2023 None Detected  NONE DETECTED (Cut Off Level 50 ng/mL) Final   Color, Urine 03/19/2023 AMBER (A)  YELLOW Final   BIOCHEMICALS  MAY BE AFFECTED BY COLOR   APPearance 03/19/2023 CLOUDY (A)  CLEAR Final   Specific Gravity, Urine 03/19/2023 1.025  1.005 - 1.030 Final   pH 03/19/2023 5.0  5.0 - 8.0 Final   Glucose, UA 03/19/2023 NEGATIVE  NEGATIVE mg/dL Final   Hgb urine dipstick 03/19/2023 NEGATIVE  NEGATIVE Final   Bilirubin Urine 03/19/2023 MODERATE (A)  NEGATIVE Final   Ketones, ur 03/19/2023 5 (A)  NEGATIVE mg/dL Final   Protein, ur 16/09/9603 100 (A)  NEGATIVE mg/dL Final   Nitrite 54/08/8118 NEGATIVE  NEGATIVE Final   Leukocytes,Ua 03/19/2023 MODERATE (A)  NEGATIVE Final   RBC / HPF 03/19/2023 0-5  0 - 5 RBC/hpf  Final   WBC, UA 03/19/2023 21-50  0 - 5 WBC/hpf Final   Bacteria, UA 03/19/2023 MANY (A)  NONE SEEN Final   Squamous Epithelial / HPF 03/19/2023 6-10  0 - 5 /HPF Final   Mucus 03/19/2023 PRESENT   Final   Hyaline Casts, UA 03/19/2023 PRESENT   Final   Non Squamous Epithelial 03/19/2023 0-5 (A)  NONE SEEN Final   Performed at [facility], [address] [place], [place] [address]   Magnesium 03/19/2023 2.2  1.7 - 2.4 mg/dL Final   Performed at [facility], [address] [place], [place] [address]   Vitamin B-12 03/19/2023 398  180 - 914 pg/mL Final   Comment: (NOTE) This assay is not validated for testing neonatal or myeloproliferative syndrome specimens for Vitamin B12 levels. Performed at [facility], [address], [place], [place] [address]    SARSCOV2ONAVIRUS 2 AG 03/19/2023 NEGATIVE  NEGATIVE Final   Comment: (NOTE) SARS-CoV-2 antigen NOT DETECTED.   Negative results are presumptive.  Negative results do not preclude SARS-CoV-2 infection and should not be used as the sole basis for treatment or other patient management decisions, including infection  control decisions, particularly in the presence of clinical signs and  symptoms consistent with COVID-19, or in those who have been in contact with the virus.  Negative results must be combined with clinical observations, patient history, and epidemiological information. The expected result is Negative.  Fact Sheet for Patients: https://www.jennings-kim.com/  Fact Sheet for Healthcare Providers: https://alexander-rogers.biz/  This test is not yet approved or cleared by the [place] FDA and  has been authorized for detection and/or diagnosis of SARS-CoV-2 by FDA under an Emergency Use Authorization (EUA).  This EUA will remain in effect (meaning this test can be used) for the duration of  the COV                          ID-19 declaration under Section 564(b)(1) of the Act,  21 U.S.C. section 360bbb-3(b)(1), unless the authorization is terminated or revoked sooner.     Sodium 03/22/2023 135  135 - 145 mmol/L Final   Potassium 03/22/2023 3.6  3.5 - 5.1 mmol/L Final   Chloride 03/22/2023 99  98 - 111 mmol/L Final   CO2 03/22/2023 27  22 - 32 mmol/L Final   Glucose, Bld 03/22/2023 97  70 - 99 mg/dL Final   Glucose reference range applies only to samples taken after fasting for at least 8 hours.   BUN 03/22/2023 22  8 - 23 mg/dL Final   Creatinine, Ser 03/22/2023 0.72  0.44 - 1.00 mg/dL Final   Calcium 78/46/9629 9.4  8.9 - 10.3 mg/dL Final   Total Protein 52/84/1324 6.9  6.5 - 8.1  g/dL Final   Albumin 13/07/6577 3.8  3.5 - 5.0 g/dL Final   AST 46/96/2952 16  15 - 41 U/L Final   ALT 03/22/2023 18  0 - 44 U/L Final   Alkaline Phosphatase 03/22/2023 63  38 - 126 U/L Final   Total Bilirubin 03/22/2023 0.7  0.3 - 1.2 mg/dL Final   GFR, Estimated 03/22/2023 >60  >60 mL/min Final   Comment: (NOTE) Calculated using the CKD-EPI Creatinine Equation (2021)    Anion gap 03/22/2023 9  5 - 15 Final   Performed at [facility], [address], [place], [place] [address]  Admission on 02/11/2023, Discharged on 02/17/2023  Component Date Value Ref Range Status   Alcohol, Ethyl (B) 02/11/2023 72 (H)  <10 mg/dL Final   Comment: (NOTE) Lowest detectable limit for serum alcohol is 10 mg/dL.  For medical purposes only. Performed at [facility], [address], [place], [place] [address]    Sodium 02/11/2023 136  135 - 145 mmol/L Final   Potassium 02/11/2023 3.2 (L)  3.5 - 5.1 mmol/L Final   Chloride 02/11/2023 97 (L)  98 - 111 mmol/L Final   CO2 02/11/2023 24  22 - 32 mmol/L Final   Glucose, Bld 02/11/2023 97  70 - 99 mg/dL Final   Glucose reference range applies only to samples taken after fasting for at least 8 hours.   BUN 02/11/2023 7 (L)  8 - 23 mg/dL Final   Creatinine, Ser 02/11/2023 0.47  0.44 - 1.00 mg/dL Final   Calcium 27/25/3664 9.2  8.9 -  10.3 mg/dL Final   Total Protein 40/34/7425 7.7  6.5 - 8.1 g/dL Final   Albumin 95/63/8756 4.7  3.5 - 5.0 g/dL Final   AST 43/32/9518 35  15 - 41 U/L Final   ALT 02/11/2023 34  0 - 44 U/L Final   Alkaline Phosphatase 02/11/2023 91  38 - 126 U/L Final   Total Bilirubin 02/11/2023 1.7 (H)  0.3 - 1.2 mg/dL Final   GFR, Estimated 02/11/2023 >60  >60 mL/min Final   Comment: (NOTE) Calculated using the CKD-EPI Creatinine Equation (2021)    Anion gap 02/11/2023 15  5 - 15 Final   Performed at [facility], [address] [place], [place] [address]   WBC 02/11/2023 9.2  4.0 - 10.5 K/uL Final   RBC 02/11/2023 4.23  3.87 - 5.11 MIL/uL Final   Hemoglobin 02/11/2023 13.4  12.0 - 15.0 g/dL Final   HCT 06/05/1600 38.8  36.0 - 46.0 % Final   MCV 02/11/2023 91.7  80.0 - 100.0 fL Final   MCH 02/11/2023 31.7  26.0 - 34.0 pg Final   MCHC 02/11/2023 34.5  30.0 - 36.0 g/dL Final   RDW 09/32/3557 13.0  11.5 - 15.5 % Final   Platelets 02/11/2023 422 (H)  150 - 400 K/uL Final   nRBC 02/11/2023 0.0  0.0 - 0.2 % Final   Performed at [facility], [address], [place], [place] [address]   Magnesium 02/11/2023 2.0  1.7 - 2.4 mg/dL Final   Performed at [facility], [address] [place], [place] [address]   Phosphorus 02/11/2023 3.7  2.5 - 4.6 mg/dL Final   Performed at [facility], [address] [place], [place] [address]   Vitamin B-12 02/11/2023 252  180 - 914 pg/mL Final   Comment: (NOTE) This assay is not validated for testing neonatal or myeloproliferative syndrome specimens  for Vitamin B12 levels. Performed at [facility], [address] [place], [place] [address]    Folate 02/11/2023 6.3  >5.9 ng/mL Final   Performed at [facility], [address], [place], [place] [address]   TSH 02/11/2023 3.115  0.350 - 4.500 uIU/mL Final   Comment: Performed by a 3rd Generation assay with a functional sensitivity of <=0.01 uIU/mL. Performed at [facility]  Lab, [address], [place], [place] [address]    SARS Coronavirus 2 by RT PCR 02/11/2023 NEGATIVE  NEGATIVE Final   Influenza A by PCR 02/11/2023 NEGATIVE  NEGATIVE Final   Influenza B by PCR 02/11/2023 NEGATIVE  NEGATIVE Final   Comment: (NOTE) The Xpert Xpress SARS-CoV-2/FLU/RSV plus assay is intended as an aid in the diagnosis of influenza from Nasopharyngeal swab specimens and should not be used as a sole basis for treatment. Nasal washings and aspirates are unacceptable for Xpert Xpress SARS-CoV-2/FLU/RSV testing.  Fact Sheet for Patients: BloggerCourse.com  Fact Sheet for Healthcare Providers: SeriousBroker.it  This test is not yet approved or cleared by the [place] FDA and has been authorized for detection and/or diagnosis of SARS-CoV-2 by FDA under an Emergency Use Authorization (EUA). This EUA will remain in effect (meaning this test can be used) for the duration of the COVID-19 declaration under Section 564(b)(1) of the Act, 21 U.S.C. section 360bbb-3(b)(1), unless the authorization is terminated or revoked.     Resp Syncytial Virus by PCR 02/11/2023 NEGATIVE  NEGATIVE Final   Comment: (NOTE) Fact Sheet for Patients: BloggerCourse.com  Fact Sheet for Healthcare Providers: SeriousBroker.it  This test is not yet approved or cleared by the [place] FDA and has been authorized for detection and/or diagnosis of SARS-CoV-2 by FDA under an Emergency Use Authorization (EUA). This EUA will remain in effect (meaning this test can be used) for the duration of the COVID-19 declaration under Section 564(b)(1) of the Act, 21 U.S.C. section 360bbb-3(b)(1), unless the authorization is terminated or revoked.  Performed at [facility], [address], [place], [place] [address]    Potassium 02/14/2023 4.2  3.5 - 5.1 mmol/L Final   Performed at [facility]  Lab, [address], [place], [place] [address]  Admission on 11/06/2022, Discharged on 11/06/2022  Component Date Value Ref Range Status   WBC 11/06/2022 7.0  4.0 - 10.5 K/uL Final   RBC 11/06/2022 4.23  3.87 - 5.11 MIL/uL Final   Hemoglobin 11/06/2022 13.9  12.0 - 15.0 g/dL Final   HCT 13/07/6577 39.7  36.0 - 46.0 % Final   MCV 11/06/2022 93.9  80.0 - 100.0 fL Final   MCH 11/06/2022 32.9  26.0 - 34.0 pg Final   MCHC 11/06/2022 35.0  30.0 - 36.0 g/dL Final   RDW 46/96/2952 12.6  11.5 - 15.5 % Final   Platelets 11/06/2022 314  150 - 400 K/uL Final   nRBC 11/06/2022 0.0  0.0 - 0.2 % Final   Neutrophils Relative % 11/06/2022 64  % Final   Neutro Abs 11/06/2022 4.5  1.7 - 7.7 K/uL Final   Lymphocytes Relative 11/06/2022 25  % Final   Lymphs Abs 11/06/2022 1.8  0.7 - 4.0 K/uL Final   Monocytes Relative 11/06/2022 7  % Final   Monocytes Absolute 11/06/2022 0.5  0.1 - 1.0 K/uL Final   Eosinophils Relative 11/06/2022 1  % Final   Eosinophils Absolute 11/06/2022 0.1  0.0 - 0.5 K/uL Final   Basophils Relative 11/06/2022  2  % Final   Basophils Absolute 11/06/2022 0.1  0.0 - 0.1 K/uL Final   Immature Granulocytes 11/06/2022 1  % Final   Abs Immature Granulocytes 11/06/2022 0.04  0.00 - 0.07 K/uL Final   Performed at [facility], [address] [place], [place] [address]   Sodium 11/06/2022 137  135 - 145 mmol/L Final   Potassium 11/06/2022 3.0 (L)  3.5 - 5.1 mmol/L Final   Chloride 11/06/2022 100  98 - 111 mmol/L Final   CO2 11/06/2022 21 (L)  22 - 32 mmol/L Final   Glucose, Bld 11/06/2022 117 (H)  70 - 99 mg/dL Final   Glucose reference range applies only to samples taken after fasting for at least 8 hours.   BUN 11/06/2022 13  8 - 23 mg/dL Final   Creatinine, Ser 11/06/2022 0.48  0.44 - 1.00 mg/dL Final   Calcium 60/45/4098 9.4  8.9 - 10.3 mg/dL Final   Total Protein 11/91/4782 7.6  6.5 - 8.1 g/dL Final   Albumin 95/62/1308 4.3  3.5 - 5.0 g/dL Final   AST 65/78/4696 45 (H)   15 - 41 U/L Final   ALT 11/06/2022 38  0 - 44 U/L Final   Alkaline Phosphatase 11/06/2022 74  38 - 126 U/L Final   Total Bilirubin 11/06/2022 1.6 (H)  0.3 - 1.2 mg/dL Final   GFR, Estimated 11/06/2022 >60  >60 mL/min Final   Comment: (NOTE) Calculated using the CKD-EPI Creatinine Equation (2021)    Anion gap 11/06/2022 16 (H)  5 - 15 Final   Performed at [facility], [address] [place], [place] [address]   Alcohol, Ethyl (B) 11/06/2022 54 (H)  <10 mg/dL Final   Comment: (NOTE) Lowest detectable limit for serum alcohol is 10 mg/dL.  For medical purposes only. Performed at [facility], [address], [place], [place] [address]     Blood Alcohol level:  Lab Results  Component Value Date   ETH <10 03/19/2023   ETH 72 (H) 02/11/2023    Metabolic Disorder Labs: No results found for: "HGBA1C", "MPG" No results found for: "PROLACTIN" No results found for: "CHOL", "TRIG", "HDL", "CHOLHDL", "VLDL", "LDLCALC"  Therapeutic Lab Levels: No results found for: "LITHIUM" No results found for: "VALPROATE" No results found for: "CBMZ"  Physical Findings   PHQ2-9    Flowsheet Row ED from 03/19/2023 in [facility] ED from 02/11/2023 in [facility]  PHQ-2 Total Score 2 1  PHQ-9 Total Score 5 4      Flowsheet Row ED from 03/19/2023 in [facility] ED from 02/11/2023 in [facility] ED from 11/06/2022 in [facility] at [facility]  C-SSRS RISK CATEGORY No Risk No Risk No Risk        Musculoskeletal  Strength & Muscle Tone: decreased Gait & Station:  more steady Patient leans: N/A  Psychiatric Specialty Exam  Presentation  General Appearance:  Appropriate for Environment; Casual  Eye Contact: Good  Speech: Clear and Coherent; Normal Rate  Speech Volume: Normal  Handedness: Right   Mood  and Affect  Mood: Depressed  Affect: Appropriate; Non-Congruent   Thought Process  Thought Processes: Coherent; Goal Directed  Descriptions of Associations:Intact  Orientation:Full (Time, Place and Person)  Thought Content:Rumination  Diagnosis of Schizophrenia or Schizoaffective disorder in past: No    Hallucinations:Hallucinations: None    Ideas of Reference:None  Suicidal  Thoughts:Suicidal Thoughts: No    Homicidal Thoughts:Homicidal Thoughts: No     Sensorium  Memory: Immediate Fair; Recent Fair  Judgment: Poor  Insight: Lacking; Shallow   Executive Functions  Concentration: Fair  Attention Span: Fair  Recall: [employer] of Knowledge: Fair  Language: Good   Psychomotor Activity  Psychomotor Activity: Psychomotor Activity: Normal     Assets  Assets: Communication Skills; Desire for Improvement; Social Support; Health and safety inspector; Housing; Intimacy; Leisure Time   Sleep  Sleep: Sleep: Good      Physical Exam  Physical Exam HENT:     Head: Normocephalic and atraumatic.  Pulmonary:     Effort: Pulmonary effort is normal.  Neurological:     Mental Status: She is alert and oriented to person, place, and time.    Review of Systems  Gastrointestinal:  Positive for diarrhea.  Genitourinary:  Positive for urgency. Negative for dysuria.  Psychiatric/Behavioral:  Negative for hallucinations and suicidal ideas. The patient does not have insomnia.    Blood pressure 138/80, pulse 93, temperature 98.3 F (36.8 C), temperature source Oral, resp. rate 16, height 5\' 6"  (1.676 m), weight 147 lb (66.7 kg), SpO2 99 %. Body mass index is 23.73 kg/m.  Treatment Plan Summary: Daily contact with patient to assess and evaluate symptoms and progress in treatment and Medication management   Based on assessment today patient continues to be somewhat interested in rehab, but quickly changes her mind. We have finally agreed on a  plan with patient and her husband for her to go to [facility] in [place], [place]. However, with bed availability not until the beginning of May, there were concerns for where patient would go in the interim. Luckily, her husband is a great support and working to arrange this. Insight continues to be poor regarding her alcohol consumption. She also associates her loneliness with increased Etoh and recognizes that being alone right now is not in her best interest, and this is improvement. However, this fluctuates day by day. She continues to perseverate on her families flaws and struggles with introspection, and has VERY poor insight into this.     Etoh use d/o Hx of complicated withdrawal from BZDs Hx of BZD delirium MDD GAD - Continue Zoloft 100mg  daily - Continue Remeron 7.5 mg qHS for depressive symptom adjunct and sleep - Continue Buspar 5mg  BID CIWA Librium protocol completed, with the following to be continued: -Loperamide 2 to 4 mg oral as needed/diarrhea or loose stools -Multivitamin with minerals 1 tablet daily -Ondansetron disintegrating tablet 4 mg every 6 as needed/nausea or vomiting -Thiamine tablet 250 mg daily   Hypokalmeia, resolved Hyponatremia, resolved Diarrhea, improving - Recommend no more than 1 cup of coffee/ day - Imodium PRN   Hyperbilirubinemia, resolved - Likely 2/2 to Etoh use   HTN - Continue amlodipine 10mg  daily   UTI Diarrhea - Probiotics - keflex 500mg  BID for 5 days   Dispo: Discharge home tomorrow while awaiting Rebound bed. Safe disposition plan arranged with husband. See note from SW Ava.  PGY-2 Lamar Sprinkles, MD 03/25/2023    11:47 AM

## 2023-03-26 NOTE — ED Notes (Signed)
Pt is in the bed sleeping. Respirations are even and unlabored. No acute distress noted. Will continue to monitor for safety. 

## 2023-03-26 NOTE — ED Notes (Signed)
Patient A&O x 4, ambulatory. Patient discharged in no acute distress. Patient denied SI/HI, A/VH upon discharge. Patient verbalized understanding of all discharge instructions explained by staff, to include follow up appointments, RX's and safety plan. Patient reported mood 10/10.  Pt belongings returned to patient from locker #  intact. Patient escorted to lobby via staff for transport to destination. Safety maintained.   

## 2023-04-01 ENCOUNTER — Encounter (HOSPITAL_COMMUNITY): Payer: Self-pay | Admitting: Student in an Organized Health Care Education/Training Program

## 2023-04-01 ENCOUNTER — Telehealth (HOSPITAL_BASED_OUTPATIENT_CLINIC_OR_DEPARTMENT_OTHER): Payer: Medicare PPO | Admitting: Student in an Organized Health Care Education/Training Program

## 2023-04-01 ENCOUNTER — Telehealth (HOSPITAL_COMMUNITY): Payer: Self-pay | Admitting: Licensed Clinical Social Worker

## 2023-04-01 DIAGNOSIS — F1020 Alcohol dependence, uncomplicated: Secondary | ICD-10-CM

## 2023-04-01 DIAGNOSIS — F411 Generalized anxiety disorder: Secondary | ICD-10-CM

## 2023-04-01 DIAGNOSIS — F332 Major depressive disorder, recurrent severe without psychotic features: Secondary | ICD-10-CM

## 2023-04-01 DIAGNOSIS — F1021 Alcohol dependence, in remission: Secondary | ICD-10-CM

## 2023-04-01 MED ORDER — HYDROXYZINE PAMOATE 25 MG PO CAPS
25.0000 mg | ORAL_CAPSULE | Freq: Three times a day (TID) | ORAL | 0 refills | Status: DC | PRN
Start: 2023-04-01 — End: 2023-05-26

## 2023-04-01 MED ORDER — TRAZODONE HCL 100 MG PO TABS
100.0000 mg | ORAL_TABLET | Freq: Every evening | ORAL | 1 refills | Status: DC | PRN
Start: 2023-04-01 — End: 2023-05-26

## 2023-04-01 MED ORDER — MIRTAZAPINE 7.5 MG PO TABS
7.5000 mg | ORAL_TABLET | Freq: Every day | ORAL | 0 refills | Status: DC
Start: 2023-04-01 — End: 2023-05-26

## 2023-04-01 MED ORDER — BUSPIRONE HCL 15 MG PO TABS: 15.0000 mg | ORAL_TABLET | Freq: Two times a day (BID) | ORAL | 1 refills | Status: DC

## 2023-04-01 NOTE — Progress Notes (Signed)
Virtual Visit via Video Note  I connected with Stephanie Riley on 04/01/23 at  3:30 PM EDT by a video enabled telemedicine application and verified that I am speaking with the correct person using two identifiers.  Location: Patient: Home Provider: Office   I discussed the limitations of evaluation and management by telemedicine and the availability of in person appointments. The patient expressed understanding and agreed to proceed.      I discussed the assessment and treatment plan with the patient. The patient was provided an opportunity to ask questions and all were answered. The patient agreed with the plan and demonstrated an understanding of the instructions.   The patient was advised to call back or seek an in-person evaluation if the symptoms worsen or if the condition fails to improve as anticipated.  I provided 30 minutes of non-face-to-face time during this encounter.   [name], MD  [facility] MD/PA/NP OP Progress Note  04/01/2023 5:05 PM GENAVIVE KUBICKI  MRN:  [id number]  Chief Complaint:  Chief Complaint  Patient presents with   Follow-up   HPI: Stephanie Riley is a 40 yo patient with a PPH of MDD, Etoh use disorder, self-reported ADHD, anxiety, and chronic prescribed BZD use.  Current regimen:   - Remeron 7.5mg  QHS - Buspar 5mg  TID - Naltrexone 50mg  daily, rx by PCP  Daughter is here for appt today. Sober since 03/18/2023. Patient endorses she is taking her medications.    Patient reports that she has been staying mostly by herself, but her husband,  sister  and her daughter have been visiting and are spending the night.  Patient reports that she has no intention of drinking again.   Patient reports that she took Buspar QID. She does feel like it is beneficial. he endorses that she will take up to 200mg  of trazodone QHS, but will go down to 150mg . She does still take Hydroxzyine and does feel like its helps. She does think that her anxiety still needs to be  treated, as she feels anxious about her marriage. She reports that her mood has improved some, down when she thinks about her marriage. She also recently found out that her husband who is separated from her is dating and this led to her feeling, "devastated" and endorsing SI to her family, which later resolved. Patient herself reports that she does not ever see herself actually attempting suicide due to religious beliefs. Did discuss with patient more appropriate ways to handle with her feelings.   Patient also reports that has talked to her daughter about going to Independent living and they may go look at some soon. She reports that she can see how bad the Etoh was for her as she recalls how unsteady she was and reiterates that she is not having cravings and has no intention of drinking again. She reports that she finds joy in nonalcoholic alcohol beverages now. She reports that she is sleeping well. She reports that her appetite is good, she enjoys ice cream most. Patient endorses that her energy is still a bit low, but thinks it is because she is getting her energy back. She denies SI, HI, and AVH.  Daughter clarified that patient did not got GA because her insurance would not cover the stay since she discharged. It would only cover if she relapsed and and was drinking for 5 days.   Patient endorses she made the decision to not go to her HS reunion to make sure she stays sober.  Patient endorses she is still too shaky to drive due to anxiety.    She does endorses knowing friends at AA at [employer]. A friend also told her that she sounds like herself.    Visit Diagnosis:    ICD-10-CM   1. Alcohol use disorder, severe, in early remission  F10.21 Ambulatory referral to [facility] Intensive OP [facility]    traZODone (DESYREL) 100 MG tablet    2. Alcohol use disorder, moderate, dependence  F10.20 Ambulatory referral to [facility] Intensive OP Program    3. Severe episode of recurrent major depressive  disorder, without psychotic features  F33.2 mirtazapine (REMERON) 7.5 MG tablet    traZODone (DESYREL) 100 MG tablet    4. GAD (generalized anxiety disorder)  F41.1 busPIRone (BUSPAR) 15 MG tablet    mirtazapine (REMERON) 7.5 MG tablet    hydrOXYzine (VISTARIL) 25 MG capsule      Past Psychiatric History: OPT: Hx of Adderall, Zoloft, Trazodone, Wellbutrin, Xanax, Klonopin, no psychiatry opt 12/2022- INPT at [facility] , patient noted to have yellow/orange sclera on arrival, but cleared by discharge. Dx with Etoh use d/o,. MOCA was done due to cognitive concerns, scored 23/30. 2nd hosp in 2024- [facility], BZD withdrawal Dx: MDD, Etoh use disorder, ADHD, anxiety, and chronic prescribed BZD use. Therapy: marriage counseling 03/2023- Sent to [facility], for Etoh detox. Patient was already approx 5 days sober from BZDs but still heavily drinking. In [facility] patient had hyperbilirubinemia,  hypokalemia and UTI and was treated for this. She was also placed on Librium taper, patient tolerated well and cd with plan to go to [facility] in [place] via family. Unfortunately insurance would no longer cover since she left the detox faciltiy, but this was not known to family at the time.  Past Medical History:  Past Medical History:  Diagnosis Date   Alcohol abuse    HTN (hypertension)    History reviewed. No pertinent surgical history.  Family Psychiatric History:  Both parents had Alzhmier's   Family History: History reviewed. No pertinent family history.  Social History:  Social History   Socioeconomic History   Marital status: Married    Spouse name: Not on file   Number of children: Not on file   Years of education: Not on file   Highest education level: Not on file  Occupational History   Not on file  Tobacco Use   Smoking status: Never   Smokeless tobacco: Never  Substance and Sexual Activity   Alcohol use: Yes   Drug use: Never   Sexual activity: Not Currently  Other Topics Concern   Not on file   Social History Narrative   Not on file   Social Determinants of Health   Financial Resource Strain: Not on file  Food Insecurity: No Food Insecurity (03/19/2023)   Hunger Vital Sign    Worried About Running Out of Food in the Last Year: Never true    Ran Out of Food in the Last Year: Never true  Transportation Needs: No Transportation Needs (03/19/2023)   PRAPARE - Administrator, Civil Service (Medical): No    Lack of Transportation (Non-Medical): No  Physical Activity: Not on file  Stress: Not on file  Social Connections: Not on file    Allergies: No Known Allergies  Metabolic Disorder Labs: No results found for: "HGBA1C", "MPG" No results found for: "PROLACTIN" No results found for: "CHOL", "TRIG", "HDL", "CHOLHDL", "VLDL", "LDLCALC" Lab Results  Component Value Date   TSH 3.115  02/11/2023    Therapeutic Level Labs: No results found for: "LITHIUM" No results found for: "VALPROATE" No results found for: "CBMZ"  Current Medications: Current Outpatient Medications  Medication Sig Dispense Refill   hydrOXYzine (VISTARIL) 25 MG capsule Take 1 capsule (25 mg total) by mouth 3 (three) times daily as needed. 30 capsule 0   amLODipine (NORVASC) 10 MG tablet Take 1 tablet (10 mg total) by mouth daily. 30 tablet 0   busPIRone (BUSPAR) 15 MG tablet Take 1 tablet (15 mg total) by mouth 2 (two) times daily. 60 tablet 1   Cyanocobalamin (VITAMIN B-12 PO) Take 1 tablet by mouth daily.     hydrOXYzine (ATARAX) 25 MG tablet Take 1 tablet (25 mg total) by mouth 2 (two) times daily as needed for anxiety. 30 tablet 0   losartan-hydrochlorothiazide (HYZAAR) 100-12.5 MG tablet Take 1 tablet by mouth daily.     mirtazapine (REMERON) 7.5 MG tablet Take 1 tablet (7.5 mg total) by mouth at bedtime. 30 tablet 0   naltrexone (DEPADE) 50 MG tablet Take 50 mg by mouth daily.     saccharomyces boulardii (FLORASTOR) 250 MG capsule Take 1 capsule (250 mg total) by mouth daily. 30 capsule 0    traZODone (DESYREL) 100 MG tablet Take 1 tablet (100 mg total) by mouth at bedtime as needed for sleep. Take 100mg - 150mg  by mouth at bedtime as needed. 30 tablet 1   No current facility-administered medications for this visit.     Psychiatric Specialty Exam: Review of Systems  Psychiatric/Behavioral:  Positive for dysphoric mood. Negative for decreased concentration, hallucinations, sleep disturbance and suicidal ideas. The patient is nervous/anxious.     There were no vitals taken for this visit.There is no height or weight on file to calculate BMI.  General Appearance: Casual  Eye Contact:  Good  Speech:  Clear and Coherent  Volume:  Normal  Mood:  Anxious  Affect:  Appropriate  Thought Process:  Coherent  Orientation:  Full (Time, Place, and Person)  Thought Content: Logical   Suicidal Thoughts:  No  Homicidal Thoughts:  No  Memory:  Immediate;   Good Recent;   Good  Judgement:  Other:  Improving  Insight:   Improving  Psychomotor Activity:  Normal  Concentration:  Concentration: Fair  Recall:  Fair  Fund of Knowledge: Good  Language: Good  Akathisia:  No  Handed:    AIMS (if indicated): not done  Assets:  Communication Skills Desire for Improvement Housing Resilience Social Support  ADL's:  Intact  Cognition: WNL  Sleep:  Good   Screenings: PHQ2-9    Flowsheet Row ED from 03/19/2023 in [facility] ED from 02/11/2023 in [facility]  PHQ-2 Total Score 3 1  PHQ-9 Total Score 3 4      Flowsheet Row ED from 03/19/2023 in [facility] ED from 02/11/2023 in [facility] ED from 11/06/2022 in [facility] Emergency Department at [facility]  C-SSRS RISK CATEGORY No Risk No Risk No Risk        Assessment and Plan:   Patient continues to show improvement. Her insight has drastically improved, as she is able to say out loud that she nearly died  from her drinking. She continues to be I denial about the intervention, but overall this is improvement. She continues to struggle with managing her emotions especially related to her relationships within the family, but she is now a bit  more focused on her own health, than previously. She is also now able to say out loud that she has anxiety, and that it manifests in different ways. She is showing improvement in judgement by endorsing willingness to participate in CDIOP and AA. She is also not driving because she feels her general anxiety is too high. She does appear to be happy with all the attention she is getting from her family right now, and this will need to be capitalized on, by getting her into CDIOP, before her feelings of loneliness become more of a problem increasing risk for relapse. He Remeron may need increasing in the future, but because she is used to immediate response from medicine, will focus on increasing the short term, medication this visit to help minimize patient seeking out Etoh for her anxiety. Will also add back Hydroxyzine. Do recommend patient continue Naltrexone prescribed by PCP. Did make it very clear to patient that she must take medications as instructed.   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 Patient/Guardian was advised if they have not already done so to contact the registration department to sign all necessary forms in order for [place] to release information regarding their care.   Consent: Patient/Guardian gives verbal consent for treatment and assignment of benefits for services provided during this visit. Patient/Guardian expressed understanding and agreed to proceed.   PGY-3 [name], MD 04/01/2023, 5:05 PM

## 2023-04-01 NOTE — Patient Instructions (Signed)
Please contact one of the following facilities to start medication management and therapy services:   [facility]  [address] [place], [place] [address] [phone]  [facility]  [address], [place], [place] [address] [phone]  [facility]  [address]  [place], [place] [address] [phone]  [facility]  [address] [place], [place] [address] [phone]  [facility]  [address],  [place], [place] [address] [phone]  

## 2023-04-01 NOTE — Telephone Encounter (Signed)
The therapist calls [facility] as the request of Dr. Morrie Sheldon confirming her identity via two identifiers. She says that she has not drunk alcohol since April 11th and has had no benzodiazepines in probably three weeks saying that she was on Klonepin for 30 years.   She is unable to drive for two more weeks so is going to check on the price of an Benedetto Goad to and from [employer] and will also call [employer] to verify the cost of attending CD IOP per diem and will call this therapist back at [phone] after doing so.  [name], MA, LCSW, [facility], LCAS 04/01/2023

## 2023-04-02 NOTE — Addendum Note (Signed)
Addended by: Everlena Cooper on: 04/02/2023 08:39 AM   Modules accepted: Level of Service

## 2023-05-12 ENCOUNTER — Ambulatory Visit (HOSPITAL_COMMUNITY): Payer: Medicare PPO | Admitting: Student in an Organized Health Care Education/Training Program

## 2023-05-12 ENCOUNTER — Encounter (HOSPITAL_COMMUNITY): Payer: Self-pay

## 2023-05-12 NOTE — Progress Notes (Deleted)
BH MD/PA/NP OP Progress Note  05/12/2023 12:06 PM [name]  MRN:  [id number]  Chief Complaint: No chief complaint on file.  HPI: Stephanie Riley is a 72 yo patient with a PPH of MDD, Etoh use disorder, self-reported ADHD, anxiety, and chronic prescribed BZD use.  Current regimen:    - Remeron 7.5mg  QHS - Buspar 5mg  TID - Naltrexone 50mg  daily, rx by PCP  AA   Visit Diagnosis: No diagnosis found.  Past Psychiatric History: OPT: Hx of Adderall, Zoloft, Trazodone, Wellbutrin, Xanax, Klonopin, no psychiatry opt 12/2022- INPT at [facility] , patient noted to have yellow/orange sclera on arrival, but cleared by discharge. Dx with Etoh use d/o,. MOCA was done due to cognitive concerns, scored 23/30. 2nd hosp in 2024- [facility], BZD withdrawal Dx: MDD, Etoh use disorder, ADHD, anxiety, and chronic prescribed BZD use. Therapy: marriage counseling 03/2023- Sent to [facility], for Etoh detox. Patient was already approx 5 days sober from BZDs but still heavily drinking. In [facility] patient had hyperbilirubinemia,  hypokalemia and UTI and was treated for this. She was also placed on Librium taper, patient tolerated well and cd with plan to go to [facility] in [place] via family. Unfortunately insurance would no longer cover since she left the detox faciltiy, but this was not known to family at the time.  03/2023- Patient is sober from [place], still endorsing anxiety especially around driving. Daughter was present for assessment. Increased buspar to 15mg  BID, continued naltrexone 50mg  daily, remeron 7.5mg  QHS  Past Medical History:  Past Medical History:  Diagnosis Date   Alcohol abuse    HTN (hypertension)    No past surgical history on file.  Family Psychiatric History: Both parents had Alzhmier's   Family History: No family history on file.  Social History:  Social History   Socioeconomic History   Marital status: Married    Spouse name: Not on file   Number of children: Not on file   Years of  education: Not on file   Highest education level: Not on file  Occupational History   Not on file  Tobacco Use   Smoking status: Never   Smokeless tobacco: Never  Substance and Sexual Activity   Alcohol use: Yes   Drug use: Never   Sexual activity: Not Currently  Other Topics Concern   Not on file  Social History Narrative   Not on file   Social Determinants of Health   Financial Resource Strain: Not on file  Food Insecurity: No Food Insecurity (03/19/2023)   Hunger Vital Sign    Worried About Running Out of Food in the Last Year: Never true    Ran Out of Food in the Last Year: Never true  Transportation Needs: No Transportation Needs (03/19/2023)   PRAPARE - Administrator, Civil Service (Medical): No    Lack of Transportation (Non-Medical): No  Physical Activity: Not on file  Stress: Not on file  Social Connections: Not on file    Allergies: No Known Allergies  Metabolic Disorder Labs: No results found for: "HGBA1C", "MPG" No results found for: "PROLACTIN" No results found for: "CHOL", "TRIG", "HDL", "CHOLHDL", "VLDL", "LDLCALC" Lab Results  Component Value Date   TSH 3.115 02/11/2023    Therapeutic Level Labs: No results found for: "LITHIUM" No results found for: "VALPROATE" No results found for: "CBMZ"  Current Medications: Current Outpatient Medications  Medication Sig Dispense Refill   amLODipine (NORVASC) 10 MG tablet Take 1 tablet (10 mg total) by  mouth daily. 30 tablet 0   busPIRone (BUSPAR) 15 MG tablet Take 1 tablet (15 mg total) by mouth 2 (two) times daily. 60 tablet 1   Cyanocobalamin (VITAMIN B-12 PO) Take 1 tablet by mouth daily.     hydrOXYzine (ATARAX) 25 MG tablet Take 1 tablet (25 mg total) by mouth 2 (two) times daily as needed for anxiety. 30 tablet 0   hydrOXYzine (VISTARIL) 25 MG capsule Take 1 capsule (25 mg total) by mouth 3 (three) times daily as needed. 30 capsule 0   losartan-hydrochlorothiazide (HYZAAR) 100-12.5 MG tablet  Take 1 tablet by mouth daily.     mirtazapine (REMERON) 7.5 MG tablet Take 1 tablet (7.5 mg total) by mouth at bedtime. 30 tablet 0   naltrexone (DEPADE) 50 MG tablet Take 50 mg by mouth daily.     traZODone (DESYREL) 100 MG tablet Take 1 tablet (100 mg total) by mouth at bedtime as needed for sleep. Take 100mg - 150mg  by mouth at bedtime as needed. 30 tablet 1   No current facility-administered medications for this visit.     Musculoskeletal: Strength & Muscle Tone: {desc; muscle tone:32375} Gait & Station: {PE GAIT ED NWGN:56213} Patient leans: {Patient Leans:21022755}  Psychiatric Specialty Exam: Review of Systems  There were no vitals taken for this visit.There is no height or weight on file to calculate BMI.  General Appearance: {Appearance:22683}  Eye Contact:  {BHH EYE CONTACT:22684}  Speech:  {Speech:22685}  Volume:  {Volume (PAA):22686}  Mood:  {BHH MOOD:22306}  Affect:  {Affect (PAA):22687}  Thought Process:  {Thought Process (PAA):22688}  Orientation:  {BHH ORIENTATION (PAA):22689}  Thought Content: {Thought Content:22690}   Suicidal Thoughts:  {ST/HT (PAA):22692}  Homicidal Thoughts:  {ST/HT (PAA):22692}  Memory:  {BHH MEMORY:22881}  Judgement:  {Judgement (PAA):22694}  Insight:  {Insight (PAA):22695}  Psychomotor Activity:  {Psychomotor (PAA):22696}  Concentration:  {Concentration:21399}  Recall:  {BHH GOOD/FAIR/POOR:22877}  Fund of Knowledge: {BHH GOOD/FAIR/POOR:22877}  Language: {BHH GOOD/FAIR/POOR:22877}  Akathisia:  {BHH YES OR NO:22294}  Handed:  {Handed:22697}  AIMS (if indicated): {Desc; done/not:10129}  Assets:  {Assets (PAA):22698}  ADL's:  {BHH YQM'V:78469}  Cognition: {chl bhh cognition:304700322}  Sleep:  {BHH GOOD/FAIR/POOR:22877}   Screenings: PHQ2-9    Flowsheet Row ED from 03/19/2023 in [facility] ED from 02/11/2023 in [facility]  PHQ-2 Total Score 3 1  PHQ-9 Total Score 3 4       Flowsheet Row ED from 03/19/2023 in [facility] ED from 02[facility]11/2023 in [facility] ED from 11/06/2022 in [facility] Emergency Department at [facility]  C-SSRS RISK CATEGORY No Risk No Risk No Risk        Assessment and Plan: ***  Collaboration of Care: Collaboration of Care: [facility] OP Collaboration of GEXB:[id number]}  Patient/Guardian was advised Release of Information must be obtained prior to any record release in order to collaborate their care with an outside provider. Patient/Guardian was advised if they have not already done so to contact the registration department to sign all necessary forms in order for [place] to release information regarding their care.   Consent: Patient/Guardian gives verbal consent for treatment and assignment of benefits for services provided during this visit. Patient/Guardian expressed understanding and agreed to proceed.   PGY-3 [name], MD 05/12/2023, 12:06 PM

## 2023-05-25 ENCOUNTER — Emergency Department (HOSPITAL_COMMUNITY): Payer: Medicare PPO

## 2023-05-25 ENCOUNTER — Other Ambulatory Visit (INDEPENDENT_AMBULATORY_CARE_PROVIDER_SITE_OTHER)
Admission: EM | Admit: 2023-05-25 | Discharge: 2023-05-29 | Disposition: A | Discharge status: Rehabilitation Facility | Payer: Medicare PPO | Source: Home / Self Care | Admitting: Behavioral Health

## 2023-05-25 ENCOUNTER — Ambulatory Visit (INDEPENDENT_AMBULATORY_CARE_PROVIDER_SITE_OTHER)
Admission: EM | Admit: 2023-05-25 | Discharge: 2023-05-25 | Disposition: A | Discharge status: Home / Self Care | Payer: Medicare PPO | Source: Home / Self Care | Admitting: Licensed Clinical Social Worker

## 2023-05-25 ENCOUNTER — Other Ambulatory Visit: Payer: Self-pay

## 2023-05-25 ENCOUNTER — Encounter (HOSPITAL_COMMUNITY): Payer: Self-pay

## 2023-05-25 ENCOUNTER — Emergency Department (HOSPITAL_COMMUNITY)
Admission: EM | Admit: 2023-05-25 | Discharge: 2023-05-25 | Disposition: A | Discharge status: Home / Self Care | Payer: Medicare PPO | Attending: Emergency Medicine | Admitting: Emergency Medicine

## 2023-05-25 ENCOUNTER — Encounter (HOSPITAL_COMMUNITY): Payer: Self-pay | Admitting: Behavioral Health

## 2023-05-25 DIAGNOSIS — R269 Unspecified abnormalities of gait and mobility: Secondary | ICD-10-CM | POA: Insufficient documentation

## 2023-05-25 DIAGNOSIS — R42 Dizziness and giddiness: Secondary | ICD-10-CM | POA: Insufficient documentation

## 2023-05-25 DIAGNOSIS — F419 Anxiety disorder, unspecified: Secondary | ICD-10-CM | POA: Diagnosis not present

## 2023-05-25 DIAGNOSIS — F13230 Sedative, hypnotic or anxiolytic dependence with withdrawal, uncomplicated: Secondary | ICD-10-CM | POA: Insufficient documentation

## 2023-05-25 DIAGNOSIS — F1020 Alcohol dependence, uncomplicated: Secondary | ICD-10-CM | POA: Insufficient documentation

## 2023-05-25 DIAGNOSIS — F1010 Alcohol abuse, uncomplicated: Secondary | ICD-10-CM | POA: Insufficient documentation

## 2023-05-25 DIAGNOSIS — F1090 Alcohol use, unspecified, uncomplicated: Secondary | ICD-10-CM

## 2023-05-25 DIAGNOSIS — Z79899 Other long term (current) drug therapy: Secondary | ICD-10-CM | POA: Diagnosis not present

## 2023-05-25 DIAGNOSIS — R531 Weakness: Secondary | ICD-10-CM | POA: Insufficient documentation

## 2023-05-25 DIAGNOSIS — I10 Essential (primary) hypertension: Secondary | ICD-10-CM | POA: Insufficient documentation

## 2023-05-25 DIAGNOSIS — R110 Nausea: Secondary | ICD-10-CM | POA: Diagnosis not present

## 2023-05-25 DIAGNOSIS — R197 Diarrhea, unspecified: Secondary | ICD-10-CM | POA: Insufficient documentation

## 2023-05-25 LAB — CBC WITH DIFFERENTIAL/PLATELET
Abs Immature Granulocytes: 0.05 10*3/uL (ref 0.00–0.07)
Basophils Absolute: 0.1 10*3/uL (ref 0.0–0.1)
Basophils Relative: 1 %
Eosinophils Absolute: 0 10*3/uL (ref 0.0–0.5)
Eosinophils Relative: 0 %
HCT: 43.6 % (ref 36.0–46.0)
Hemoglobin: 14.5 g/dL (ref 12.0–15.0)
Immature Granulocytes: 1 %
Lymphocytes Relative: 16 %
Lymphs Abs: 1 10*3/uL (ref 0.7–4.0)
MCH: 30.2 pg (ref 26.0–34.0)
MCHC: 33.3 g/dL (ref 30.0–36.0)
MCV: 90.8 fL (ref 80.0–100.0)
Monocytes Absolute: 0.3 10*3/uL (ref 0.1–1.0)
Monocytes Relative: 6 %
Neutro Abs: 4.5 10*3/uL (ref 1.7–7.7)
Neutrophils Relative %: 76 %
Platelets: 241 10*3/uL (ref 150–400)
RBC: 4.8 MIL/uL (ref 3.87–5.11)
RDW: 14.9 % (ref 11.5–15.5)
WBC: 6 10*3/uL (ref 4.0–10.5)
nRBC: 0 % (ref 0.0–0.2)

## 2023-05-25 LAB — TROPONIN I (HIGH SENSITIVITY)
Troponin I (High Sensitivity): 13 ng/L (ref <18)
Troponin I (High Sensitivity): 11 ng/L (ref <18)

## 2023-05-25 LAB — I-STAT VENOUS BLOOD GAS, ED
Acid-Base Excess: 0 mmol/L (ref 0.0–2.0)
Bicarbonate: 25 mmol/L (ref 20.0–28.0)
Calcium, Ion: 1.01 mmol/L — ABNORMAL LOW (ref 1.15–1.40)
HCT: 44 % (ref 36.0–46.0)
Hemoglobin: 15 g/dL (ref 12.0–15.0)
O2 Saturation: 92 %
Potassium: 3.7 mmol/L (ref 3.5–5.1)
Sodium: 134 mmol/L — ABNORMAL LOW (ref 135–145)
TCO2: 26 mmol/L (ref 22–32)
pCO2, Ven: 39.8 mmHg — ABNORMAL LOW (ref 44–60)
pH, Ven: 7.406 (ref 7.25–7.43)
pO2, Ven: 64 mmHg — ABNORMAL HIGH (ref 32–45)

## 2023-05-25 LAB — COMPREHENSIVE METABOLIC PANEL
ALT: 62 U/L — ABNORMAL HIGH (ref 0–44)
AST: 105 U/L — ABNORMAL HIGH (ref 15–41)
Albumin: 4.4 g/dL (ref 3.5–5.0)
Alkaline Phosphatase: 87 U/L (ref 38–126)
Anion gap: 19 — ABNORMAL HIGH (ref 5–15)
BUN: 21 mg/dL (ref 8–23)
CO2: 23 mmol/L (ref 22–32)
Calcium: 8.9 mg/dL (ref 8.9–10.3)
Chloride: 96 mmol/L — ABNORMAL LOW (ref 98–111)
Creatinine, Ser: 0.63 mg/dL (ref 0.44–1.00)
GFR, Estimated: >60 mL/min (ref >60)
Glucose, Bld: 121 mg/dL — ABNORMAL HIGH (ref 70–99)
Potassium: 3.7 mmol/L (ref 3.5–5.1)
Sodium: 138 mmol/L (ref 135–145)
Total Bilirubin: 2 mg/dL — ABNORMAL HIGH (ref 0.3–1.2)
Total Protein: 6.9 g/dL (ref 6.5–8.1)

## 2023-05-25 LAB — LIPID PANEL
Cholesterol: 145 mg/dL (ref 0–200)
HDL: 91 mg/dL (ref >40)
LDL Cholesterol: 41 mg/dL (ref 0–99)
Total CHOL/HDL Ratio: 1.6 RATIO
Triglycerides: 65 mg/dL (ref <150)
VLDL: 13 mg/dL (ref 0–40)

## 2023-05-25 LAB — URINALYSIS, ROUTINE W REFLEX MICROSCOPIC
Bilirubin Urine: NEGATIVE
Glucose, UA: NEGATIVE mg/dL
Hgb urine dipstick: NEGATIVE
Ketones, ur: NEGATIVE mg/dL
Leukocytes,Ua: NEGATIVE
Nitrite: NEGATIVE
Protein, ur: NEGATIVE mg/dL
Specific Gravity, Urine: 1.025 (ref 1.005–1.030)
pH: 5 (ref 5.0–8.0)

## 2023-05-25 LAB — TSH: TSH: 1.83 u[IU]/mL (ref 0.350–4.500)

## 2023-05-25 LAB — MAGNESIUM: Magnesium: 1.8 mg/dL (ref 1.7–2.4)

## 2023-05-25 LAB — LIPASE, BLOOD: Lipase: 52 U/L — ABNORMAL HIGH (ref 11–51)

## 2023-05-25 LAB — ETHANOL: Alcohol, Ethyl (B): <10 mg/dL (ref <10)

## 2023-05-25 LAB — HEMOGLOBIN A1C
Hgb A1c MFr Bld: 4.9 % (ref 4.8–5.6)
Mean Plasma Glucose: 93.93 mg/dL

## 2023-05-25 MED ORDER — ADULT MULTIVITAMIN W/MINERALS CH
1.0000 | ORAL_TABLET | Freq: Every day | ORAL | Status: DC
  Administered 2023-05-25 – 2023-05-29 (×5): 1 via ORAL
  Filled 2023-05-25 (×5): qty 1

## 2023-05-25 MED ORDER — AMLODIPINE BESYLATE 10 MG PO TABS: 10.0000 mg | ORAL_TABLET | Freq: Every day | ORAL | 0 refills | Status: DC

## 2023-05-25 MED ORDER — HYDROXYZINE HCL 25 MG PO TABS
25.0000 mg | ORAL_TABLET | Freq: Four times a day (QID) | ORAL | Status: AC | PRN
  Administered 2023-05-27: 25 mg via ORAL
  Filled 2023-05-25 (×2): qty 1

## 2023-05-25 MED ORDER — BUSPIRONE HCL 5 MG PO TABS
5.0000 mg | ORAL_TABLET | Freq: Two times a day (BID) | ORAL | Status: DC
  Administered 2023-05-25 – 2023-05-28 (×6): 5 mg via ORAL
  Filled 2023-05-25 (×6): qty 1

## 2023-05-25 MED ORDER — MAGNESIUM HYDROXIDE 400 MG/5ML PO SUSP: 30.0000 mL | Freq: Every day | ORAL | Status: DC | PRN

## 2023-05-25 MED ORDER — MIRTAZAPINE 7.5 MG PO TABS
7.5000 mg | ORAL_TABLET | Freq: Every day | ORAL | Status: DC
  Administered 2023-05-25 – 2023-05-28 (×4): 7.5 mg via ORAL
  Filled 2023-05-25 (×4): qty 1

## 2023-05-25 MED ORDER — CHLORDIAZEPOXIDE HCL 25 MG PO CAPS: 25.0000 mg | ORAL_CAPSULE | Freq: Four times a day (QID) | ORAL | Status: AC | PRN

## 2023-05-25 MED ORDER — LOSARTAN POTASSIUM-HCTZ 100-12.5 MG PO TABS: 1.0000 | ORAL_TABLET | Freq: Every day | ORAL | 0 refills | Status: DC

## 2023-05-25 MED ORDER — CHLORDIAZEPOXIDE HCL 25 MG PO CAPS
25.0000 mg | ORAL_CAPSULE | Freq: Three times a day (TID) | ORAL | Status: AC
  Administered 2023-05-27 (×3): 25 mg via ORAL
  Filled 2023-05-25 (×3): qty 1

## 2023-05-25 MED ORDER — HYDROXYZINE HCL 25 MG PO TABS: 25.0000 mg | ORAL_TABLET | Freq: Two times a day (BID) | ORAL | 0 refills | Status: DC | PRN

## 2023-05-25 MED ORDER — MIRTAZAPINE 7.5 MG PO TABS: 7.5000 mg | ORAL_TABLET | Freq: Every day | ORAL | 0 refills | Status: DC

## 2023-05-25 MED ORDER — CHLORDIAZEPOXIDE HCL 25 MG PO CAPS
25.0000 mg | ORAL_CAPSULE | Freq: Four times a day (QID) | ORAL | Status: AC
  Administered 2023-05-25 – 2023-05-26 (×6): 25 mg via ORAL
  Filled 2023-05-25 (×6): qty 1

## 2023-05-25 MED ORDER — LOPERAMIDE HCL 2 MG PO CAPS
2.0000 mg | ORAL_CAPSULE | ORAL | Status: AC | PRN
  Administered 2023-05-25 – 2023-05-26 (×2): 2 mg via ORAL
  Administered 2023-05-26 – 2023-05-27 (×2): 4 mg via ORAL
  Filled 2023-05-25: qty 2
  Filled 2023-05-25 (×2): qty 1
  Filled 2023-05-25: qty 2

## 2023-05-25 MED ORDER — HYDROCHLOROTHIAZIDE 12.5 MG PO TABS
12.5000 mg | ORAL_TABLET | Freq: Every day | ORAL | Status: DC
  Administered 2023-05-26 – 2023-05-29 (×4): 12.5 mg via ORAL
  Filled 2023-05-25 (×4): qty 1

## 2023-05-25 MED ORDER — ONDANSETRON 4 MG PO TBDP
4.0000 mg | ORAL_TABLET | Freq: Four times a day (QID) | ORAL | Status: AC | PRN
  Administered 2023-05-25 – 2023-05-28 (×3): 4 mg via ORAL
  Filled 2023-05-25 (×3): qty 1

## 2023-05-25 MED ORDER — LORAZEPAM 1 MG PO TABS
1.0000 mg | ORAL_TABLET | Freq: Once | ORAL | Status: AC
  Administered 2023-05-25: 1 mg via ORAL
  Filled 2023-05-25: qty 1

## 2023-05-25 MED ORDER — TRAZODONE HCL 100 MG PO TABS: 100.0000 mg | ORAL_TABLET | Freq: Every day | ORAL | 0 refills | Status: DC

## 2023-05-25 MED ORDER — BUSPIRONE HCL 15 MG PO TABS: 15.0000 mg | ORAL_TABLET | Freq: Two times a day (BID) | ORAL | 0 refills | Status: DC

## 2023-05-25 MED ORDER — ONDANSETRON HCL 4 MG/2ML IJ SOLN
4.0000 mg | Freq: Once | INTRAMUSCULAR | Status: AC
  Administered 2023-05-25: 4 mg via INTRAVENOUS
  Filled 2023-05-25: qty 2

## 2023-05-25 MED ORDER — LOSARTAN POTASSIUM-HCTZ 100-12.5 MG PO TABS: 1.0000 | ORAL_TABLET | Freq: Every day | ORAL | Status: DC

## 2023-05-25 MED ORDER — THIAMINE HCL 100 MG/ML IJ SOLN
100.0000 mg | Freq: Once | INTRAMUSCULAR | Status: AC
  Administered 2023-05-25: 100 mg via INTRAMUSCULAR
  Filled 2023-05-25: qty 2

## 2023-05-25 MED ORDER — SODIUM CHLORIDE 0.9 % IV BOLUS
1000.0000 mL | Freq: Once | INTRAVENOUS | Status: AC
  Administered 2023-05-25: 1000 mL via INTRAVENOUS

## 2023-05-25 MED ORDER — ACETAMINOPHEN 325 MG PO TABS: 650.0000 mg | ORAL_TABLET | Freq: Four times a day (QID) | ORAL | Status: DC | PRN

## 2023-05-25 MED ORDER — NALTREXONE HCL 50 MG PO TABS: 50.0000 mg | ORAL_TABLET | Freq: Every day | ORAL | 0 refills | Status: DC

## 2023-05-25 MED ORDER — CHLORDIAZEPOXIDE HCL 25 MG PO CAPS
25.0000 mg | ORAL_CAPSULE | ORAL | Status: AC
  Administered 2023-05-28 (×2): 25 mg via ORAL
  Filled 2023-05-25 (×2): qty 1

## 2023-05-25 MED ORDER — CHLORDIAZEPOXIDE HCL 25 MG PO CAPS
25.0000 mg | ORAL_CAPSULE | Freq: Every day | ORAL | Status: AC
  Administered 2023-05-29: 25 mg via ORAL
  Filled 2023-05-25: qty 1

## 2023-05-25 MED ORDER — ALUM & MAG HYDROXIDE-SIMETH 200-200-20 MG/5ML PO SUSP: 30.0000 mL | ORAL | Status: DC | PRN

## 2023-05-25 MED ORDER — TRAZODONE HCL 100 MG PO TABS
100.0000 mg | ORAL_TABLET | Freq: Every evening | ORAL | Status: DC | PRN
  Administered 2023-05-25 – 2023-05-28 (×4): 100 mg via ORAL
  Filled 2023-05-25 (×4): qty 1

## 2023-05-25 MED ORDER — AMLODIPINE BESYLATE 10 MG PO TABS
10.0000 mg | ORAL_TABLET | Freq: Every day | ORAL | Status: DC
  Administered 2023-05-25 – 2023-05-29 (×5): 10 mg via ORAL
  Filled 2023-05-25 (×5): qty 1

## 2023-05-25 MED ORDER — LOSARTAN POTASSIUM 50 MG PO TABS
100.0000 mg | ORAL_TABLET | Freq: Every day | ORAL | Status: DC
  Administered 2023-05-26 – 2023-05-29 (×4): 100 mg via ORAL
  Filled 2023-05-25 (×4): qty 2

## 2023-05-25 NOTE — ED Notes (Signed)
Pt given  taxi voucher approved by Child psychotherapist. Pt sent to ER waiting room

## 2023-05-25 NOTE — Discharge Instructions (Addendum)
Please follow-up with your primary care provider in regards to further medication refills  Please follow-up with behavioral health urgent care in regards to outpatient detox options for your alcohol abuse  It was a pleasure caring for you today in the emergency department.  Please return to the emergency department for any worsening or worrisome symptoms.

## 2023-05-25 NOTE — ED Provider Notes (Signed)
Facility Based Crisis Admission H&P  Date: 05/25/23 Patient Name: Stephanie Riley MRN: [id number] Chief Complaint: "I want to detox"  Diagnoses:  Final diagnoses:  Alcohol use disorder    HPI: Stephanie Riley is a 72 y.o. female patient with a past psychiatric history of alcohol use disorder, MDD, GAD, benzodiazepine withdrawal with delirium, substance-induced mood disorder, insomnia, paranoia, adjustment disorder, and ADHD who presented voluntarily and unaccompanied to [facility] seeking alcohol detox. Patient states she was just discharged from [facility] and they sent her in a taxi to [facility] for alcohol detox.  Patient assessed face-to-face by this provider and chart reviewed on 05/25/23. On evaluation, Stephanie Riley is seated in assessment area in no acute distress. Patient is alert and oriented x4, calm, cooperative, and pleasant. Speech is clear and coherent, normal rate and volume. Eye contact is good. Mood is euthymic with congruent affect. Thought process is coherent with logical thought content. Patient denies suicidal and homicidal ideations and easily contracts verbally for safety with this Clinical research associate. Patient denies a history of suicide attempts or self-harm. Patient reports a past psychiatric hospitalization at [facility]. Per chart review, patient was admitted to [facility] in March 2024 and April 2024. Per chart review, patient was taken to [facility] from home this morning via EMS with complaints of alcohol withdrawal symptoms and was discharged with medication refills of amlodipine, buspirone, hydroxyzine, losartan-hydrochlorothiazide, mirtazapine, naltrexone, and trazodone. Patient states she does not have transportation to pick up her medications at the pharmacy in [place]. Patient denies auditory and visual hallucinations. Patient denies symptoms of paranoia. Patient is able to converse coherently with goal-directed thoughts and no distractibility or preoccupation. Objectively, there is no evidence  of psychosis/mania, delusional thinking, or indication that patient is responding to internal or external stimuli.  Patient reports good sleep (10 hours/night) and decreased appetite. Patient states she lives alone in [place] and denies access to weapons/firearms. Patient states she is retired. Patient states she was seeing Dr. Morrie Sheldon at [facility] outpatient for medication management. Per chart review, patient last had a video visit with Dr. Morrie Sheldon on 04/01/23 and was being prescribed Remeron 7.5mg  QHS, Trazodone 100-150mg  QHS PRN, Hydroxyzine 25mg  TID PRN, Naltrexone 50mg  daily, and Buspar 15mg  BID, was referred to [facility], and recommended to AA. Patient states she has not taken her medications in 2 days due to running out of them. Patient reports daily alcohol use since she was in her 70s. Patient reports currently consuming 1/2 bottle of wine daily, last use yesterday. Patient reports current withdrawal symptoms of nausea. Patient denies a history of seizures or DTs related to alcohol withdrawal. Patient denies use of other illicit substances. Patient states she is interested in alcohol detox.  Patient offered support and encouragement. Discussed options for admission to [facility] for alcohol detox or outpatient substance use treatment. Discussed restarting home medications. Patient is in agreement with plan of care for [facility] admission.   PHQ 2-9:  Flowsheet Row ED from 05/25/2023 in [facility] ED from 03/19/2023 in [facility] ED from 02/11/2023 in [facility]  Thoughts that you would be better off dead, or of hurting yourself in some way Not at all Not at all Not at all  PHQ-9 Total Score 13 3 4        Flowsheet Row ED from 05/25/2023 in [facility] Most recent reading at 05/25/2023  5:34 PM ED from 05/25/2023 in [facility] Most  recent reading at 05/25/2023   4:27 PM ED from 05/25/2023 in [facility] Emergency Department at [facility] Most recent reading at 05/25/2023 11:00 AM  C-SSRS RISK CATEGORY No Risk No Risk No Risk         Total Time spent with patient: 30 minutes  Musculoskeletal  Strength & Muscle Tone: within normal limits Gait & Station: normal Patient leans: N/A  Psychiatric Specialty Exam  Presentation General Appearance:  Appropriate for Environment  Eye Contact: Good  Speech: Clear and Coherent; Normal Rate  Speech Volume: Normal  Handedness: Right   Mood and Affect  Mood: Euthymic  Affect: Congruent   Thought Process  Thought Processes: Coherent; Goal Directed  Descriptions of Associations:Intact  Orientation:Full (Time, Place and Person)  Thought Content:Logical  Diagnosis of Schizophrenia or Schizoaffective disorder in past: No   Hallucinations:Hallucinations: None  Ideas of Reference:None  Suicidal Thoughts:Suicidal Thoughts: No  Homicidal Thoughts:Homicidal Thoughts: No   Sensorium  Memory: Immediate Fair; Recent Fair; Remote Fair  Judgment: Fair  Insight: Fair   Art therapist  Concentration: Fair  Attention Span: Fair  Recall: [employer] of Knowledge: Fair  Language: Fair   Psychomotor Activity  Psychomotor Activity:Psychomotor Activity: Normal   Assets  Assets: Manufacturing systems engineer; Desire for Improvement; Financial Resources/Insurance; Housing; Physical Health; Resilience   Sleep  Sleep:Sleep: Good Number of Hours of Sleep: 10   Nutritional Assessment (For OBS and FBC admissions only) Has the patient had a weight loss or gain of 10 pounds or more in the last 3 months?: No Has the patient had a decrease in food intake/or appetite?: Yes Does the patient have dental problems?: No Does the patient have eating habits or behaviors that may be indicators of an eating disorder including binging or inducing vomiting?: No Has the patient  recently lost weight without trying?: 0 Has the patient been eating poorly because of a decreased appetite?: 1 Malnutrition Screening Tool Score: 1    Physical Exam Vitals and nursing note reviewed.  Constitutional:      General: She is not in acute distress.    Appearance: Normal appearance. She is not ill-appearing.  HENT:     Head: Normocephalic and atraumatic.     Nose: Nose normal.  Eyes:     General:        Right eye: No discharge.        Left eye: No discharge.     Conjunctiva/sclera: Conjunctivae normal.  Cardiovascular:     Rate and Rhythm: Normal rate.  Pulmonary:     Effort: Pulmonary effort is normal. No respiratory distress.  Musculoskeletal:        General: Normal range of motion.     Cervical back: Normal range of motion.  Skin:    General: Skin is warm and dry.  Neurological:     General: No focal deficit present.     Mental Status: She is alert and oriented to person, place, and time. Mental status is at baseline.  Psychiatric:        Attention and Perception: Attention and perception normal.        Mood and Affect: Mood and affect normal.        Speech: Speech normal.        Behavior: Behavior normal. Behavior is cooperative.        Thought Content: Thought content normal. Thought content is not paranoid or delusional. Thought content does not include homicidal or suicidal ideation. Thought content does not include homicidal  or suicidal plan.        Cognition and Memory: Cognition and memory normal.        Judgment: Judgment normal.    Review of Systems  Constitutional: Negative.   HENT: Negative.    Eyes: Negative.   Respiratory: Negative.    Cardiovascular: Negative.   Gastrointestinal: Negative.   Genitourinary: Negative.   Musculoskeletal: Negative.   Skin: Negative.   Neurological: Negative.   Endo/Heme/Allergies: Negative.   Psychiatric/Behavioral:  Positive for substance abuse. Negative for depression, hallucinations, memory loss and  suicidal ideas. The patient is not nervous/anxious and does not have insomnia.     Blood pressure (!) 158/82, pulse 95, temperature 98.6 F (37 C), temperature source Oral, resp. rate 16, SpO2 97 %. There is no height or weight on file to calculate BMI.  Past Psychiatric History: Alcohol use disorder, MDD, GAD, benzodiazepine withdrawal with delirium, substance-induced mood disorder, insomnia, paranoia, adjustment disorder, and ADHD  Is the patient at risk to self? No  Has the patient been a risk to self in the past 6 months? No .    Has the patient been a risk to self within the distant past? No   Is the patient a risk to others? No   Has the patient been a risk to others in the past 6 months? No   Has the patient been a risk to others within the distant past? No   Past Medical History:  Past Medical History:  Diagnosis Date   Alcohol abuse    HTN (hypertension)    Family History: None reported  Social History:  Social History   Tobacco Use   Smoking status: Never   Smokeless tobacco: Never  Substance Use Topics   Alcohol use: Yes   Drug use: Never   Last Labs:  Admission on 05/25/2023, Discharged on 05/25/2023  Component Date Value Ref Range Status   WBC 05/25/2023 6.0  4.0 - 10.5 K/uL Final   RBC 05/25/2023 4.80  3.87 - 5.11 MIL/uL Final   Hemoglobin 05/25/2023 14.5  12.0 - 15.0 g/dL Final   HCT 16/09/9603 43.6  36.0 - 46.0 % Final   MCV 05/25/2023 90.8  80.0 - 100.0 fL Final   MCH 05/25/2023 30.2  26.0 - 34.0 pg Final   MCHC 05/25/2023 33.3  30.0 - 36.0 g/dL Final   RDW 54/08/8118 14.9  11.5 - 15.5 % Final   Platelets 05/25/2023 241  150 - 400 K/uL Final   nRBC 05/25/2023 0.0  0.0 - 0.2 % Final   Neutrophils Relative % 05/25/2023 76  % Final   Neutro Abs 05/25/2023 4.5  1.7 - 7.7 K/uL Final   Lymphocytes Relative 05/25/2023 16  % Final   Lymphs Abs 05/25/2023 1.0  0.7 - 4.0 K/uL Final   Monocytes Relative 05/25/2023 6  % Final   Monocytes Absolute 05/25/2023 0.3   0.1 - 1.0 K/uL Final   Eosinophils Relative 05/25/2023 0  % Final   Eosinophils Absolute 05/25/2023 0.0  0.0 - 0.5 K/uL Final   Basophils Relative 05/25/2023 1  % Final   Basophils Absolute 05/25/2023 0.1  0.0 - 0.1 K/uL Final   Immature Granulocytes 05/25/2023 1  % Final   Abs Immature Granulocytes 05/25/2023 0.05  0.00 - 0.07 K/uL Final   Performed at [facility], [address], [place], [place] [address]   Sodium 05/25/2023 138  135 - 145 mmol/L Final   Potassium 05/25/2023 3.7  3.5 - 5.1 mmol/L  Final   Chloride 05/25/2023 96 (L)  98 - 111 mmol/L Final   CO2 05/25/2023 23  22 - 32 mmol/L Final   Glucose, Bld 05/25/2023 121 (H)  70 - 99 mg/dL Final   Glucose reference range applies only to samples taken after fasting for at least 8 hours.   BUN 05/25/2023 21  8 - 23 mg/dL Final   Creatinine, Ser 05/25/2023 0.63  0.44 - 1.00 mg/dL Final   Calcium 16/09/9603 8.9  8.9 - 10.3 mg/dL Final   Total Protein 54/08/8118 6.9  6.5 - 8.1 g/dL Final   Albumin 14/78/2956 4.4  3.5 - 5.0 g/dL Final   AST 21/30/8657 105 (H)  15 - 41 U/L Final   ALT 05/25/2023 62 (H)  0 - 44 U/L Final   Alkaline Phosphatase 05/25/2023 87  38 - 126 U/L Final   Total Bilirubin 05/25/2023 2.0 (H)  0.3 - 1.2 mg/dL Final   GFR, Estimated 05/25/2023 >60  >60 mL/min Final   Comment: (NOTE) Calculated using the CKD-EPI Creatinine Equation (2021)    Anion gap 05/25/2023 19 (H)  5 - 15 Final   Performed at [facility] Lab, [address] [place], [place] [address]   Lipase 05/25/2023 52 (H)  11 - 51 U/L Final   Performed at [facility], [address] [place], [place] [address]   Color, Urine 05/25/2023 YELLOW  YELLOW Final   APPearance 05/25/2023 CLEAR  CLEAR Final   Specific Gravity, Urine 05/25/2023 1.025  1.005 - 1.030 Final   pH 05/25/2023 5.0  5.0 - 8.0 Final   Glucose, UA 05/25/2023 NEGATIVE  NEGATIVE mg/dL Final   Hgb urine dipstick 05/25/2023 NEGATIVE  NEGATIVE Final   Bilirubin Urine  05/25/2023 NEGATIVE  NEGATIVE Final   Ketones, ur 05/25/2023 NEGATIVE  NEGATIVE mg/dL Final   Protein, ur 41/32/4401 NEGATIVE  NEGATIVE mg/dL Final   Nitrite 02/72/5366 NEGATIVE  NEGATIVE Final   Leukocytes,Ua 05/25/2023 NEGATIVE  NEGATIVE Final   Performed at [facility], [address] [place], [place] [address]   Troponin I (High Sensitivity) 05/25/2023 13  <18 ng/L Final   Comment: (NOTE) Elevated high sensitivity troponin I (hsTnI) values and significant  changes across serial measurements may suggest ACS but many other  chronic and acute conditions are known to elevate hsTnI results.  Refer to the "Links" section for chest pain algorithms and additional  guidance. Performed at [facility], [address] [place], [place] [address]    pH, Ven 05/25/2023 7.406  7.25 - 7.43 Final   pCO2, Ven 05/25/2023 39.8 (L)  44 - 60 mmHg Final   pO2, Ven 05/25/2023 64 (H)  32 - 45 mmHg Final   Bicarbonate 05/25/2023 25.0  20.0 - 28.0 mmol/L Final   TCO2 05/25/2023 26  22 - 32 mmol/L Final   O2 Saturation 05/25/2023 92  % Final   Acid-Base Excess 05/25/2023 0.0  0.0 - 2.0 mmol/L Final   Sodium 05/25/2023 134 (L)  135 - 145 mmol/L Final   Potassium 05/25/2023 3.7  3.5 - 5.1 mmol/L Final   Calcium, Ion 05/25/2023 1.01 (L)  1.15 - 1.40 mmol/L Final   HCT 05/25/2023 44.0  36.0 - 46.0 % Final   Hemoglobin 05/25/2023 15.0  12.0 - 15.0 g/dL Final   Sample type 56/38/7564 VENOUS   Final  Admission on 03/19/2023, Discharged on 03/26/2023  Component Date Value Ref Range Status   SARS Coronavirus 2 by RT PCR  03/19/2023 NEGATIVE  NEGATIVE Final   Performed at [facility], [address], [place], [place] [address]   WBC 03/19/2023 9.3  4.0 - 10.5 K/uL Final   RBC 03/19/2023 3.87  3.87 - 5.11 MIL/uL Final   Hemoglobin 03/19/2023 11.8 (L)  12.0 - 15.0 g/dL Final   HCT 84/69/6295 35.3 (L)  36.0 - 46.0 % Final   MCV 03/19/2023 91.2  80.0 - 100.0 fL Final   MCH 03/19/2023 30.5  26.0  - 34.0 pg Final   MCHC 03/19/2023 33.4  30.0 - 36.0 g/dL Final   RDW 28/41/3244 15.4  11.5 - 15.5 % Final   Platelets 03/19/2023 181  150 - 400 K/uL Final   nRBC 03/19/2023 0.0  0.0 - 0.2 % Final   Neutrophils Relative % 03/19/2023 73  % Final   Neutro Abs 03/19/2023 6.8  1.7 - 7.7 K/uL Final   Lymphocytes Relative 03/19/2023 15  % Final   Lymphs Abs 03/19/2023 1.4  0.7 - 4.0 K/uL Final   Monocytes Relative 03/19/2023 10  % Final   Monocytes Absolute 03/19/2023 0.9  0.1 - 1.0 K/uL Final   Eosinophils Relative 03/19/2023 0  % Final   Eosinophils Absolute 03/19/2023 0.0  0.0 - 0.5 K/uL Final   Basophils Relative 03/19/2023 1  % Final   Basophils Absolute 03/19/2023 0.1  0.0 - 0.1 K/uL Final   Immature Granulocytes 03/19/2023 1  % Final   Abs Immature Granulocytes 03/19/2023 0.06  0.00 - 0.07 K/uL Final   Performed at [facility], [address], [place], [place] [address]   Sodium 03/19/2023 132 (L)  135 - 145 mmol/L Final   Potassium 03/19/2023 2.8 (L)  3.5 - 5.1 mmol/L Final   Chloride 03/19/2023 95 (L)  98 - 111 mmol/L Final   CO2 03/19/2023 24  22 - 32 mmol/L Final   Glucose, Bld 03/19/2023 97  70 - 99 mg/dL Final   Glucose reference range applies only to samples taken after fasting for at least 8 hours.   BUN 03/19/2023 21  8 - 23 mg/dL Final   Creatinine, Ser 03/19/2023 0.95  0.44 - 1.00 mg/dL Final   Calcium 25/36/6440 9.0  8.9 - 10.3 mg/dL Final   Total Protein 34/74/2595 7.1  6.5 - 8.1 g/dL Final   Albumin 63/87/5643 4.5  3.5 - 5.0 g/dL Final   AST 32/95/1884 37  15 - 41 U/L Final   ALT 03/19/2023 29  0 - 44 U/L Final   Alkaline Phosphatase 03/19/2023 81  38 - 126 U/L Final   Total Bilirubin 03/19/2023 2.9 (H)  0.3 - 1.2 mg/dL Final   GFR, Estimated 03/19/2023 >60  >60 mL/min Final   Comment: (NOTE) Calculated using the CKD-EPI Creatinine Equation (2021)    Anion gap 03/19/2023 13  5 - 15 Final   Performed at [facility], [address], [place], [place]  [address]   Alcohol, Ethyl (B) 03/19/2023 <10  <10 mg/dL Final   Comment: (NOTE) Lowest detectable limit for serum alcohol is 10 mg/dL.  For medical purposes only. Performed at [facility], [address] [place], [place] [address]    POC Amphetamine UR 03/21/2023 None Detected  NONE DETECTED (Cut Off Level 1000 ng/mL) Final   POC Secobarbital (BAR) 03/21/2023 None Detected  NONE DETECTED (Cut Off Level 300 ng/mL) Final   POC Buprenorphine (BUP) 03/21/2023 None Detected  NONE DETECTED (Cut Off Level 10 ng/mL) Final  POC Oxazepam (BZO) 03/21/2023 Positive (A)  NONE DETECTED (Cut Off Level 300 ng/mL) Final   POC Cocaine UR 03/21/2023 None Detected  NONE DETECTED (Cut Off Level 300 ng/mL) Final   POC Methamphetamine UR 03/21/2023 None Detected  NONE DETECTED (Cut Off Level 1000 ng/mL) Final   POC Morphine 03/21/2023 None Detected  NONE DETECTED (Cut Off Level 300 ng/mL) Final   POC Methadone UR 03/21/2023 None Detected  NONE DETECTED (Cut Off Level 300 ng/mL) Final   POC Oxycodone UR 03/21/2023 None Detected  NONE DETECTED (Cut Off Level 100 ng/mL) Final   POC Marijuana UR 03/21/2023 None Detected  NONE DETECTED (Cut Off Level 50 ng/mL) Final   Color, Urine 03/19/2023 AMBER (A)  YELLOW Final   BIOCHEMICALS MAY BE AFFECTED BY COLOR   APPearance 03/19/2023 CLOUDY (A)  CLEAR Final   Specific Gravity, Urine 03/19/2023 1.025  1.005 - 1.030 Final   pH 03/19/2023 5.0  5.0 - 8.0 Final   Glucose, UA 03/19/2023 NEGATIVE  NEGATIVE mg/dL Final   Hgb urine dipstick 03/19/2023 NEGATIVE  NEGATIVE Final   Bilirubin Urine 03/19/2023 MODERATE (A)  NEGATIVE Final   Ketones, ur 03/19/2023 5 (A)  NEGATIVE mg/dL Final   Protein, ur 16/09/9603 100 (A)  NEGATIVE mg/dL Final   Nitrite 54/08/8118 NEGATIVE  NEGATIVE Final   Leukocytes,Ua 03/19/2023 MODERATE (A)  NEGATIVE Final   RBC / HPF 03/19/2023 0-5  0 - 5 RBC/hpf Final   WBC, UA 03/19/2023 21-50  0 - 5 WBC/hpf Final   Bacteria, UA 03/19/2023 MANY (A)   NONE SEEN Final   Squamous Epithelial / HPF 03/19/2023 6-10  0 - 5 /HPF Final   Mucus 03/19/2023 PRESENT   Final   Hyaline Casts, UA 03/19/2023 PRESENT   Final   Non Squamous Epithelial 03/19/2023 0-5 (A)  NONE SEEN Final   Performed at [facility], [address] [place], [place] [address]   Magnesium 03/19/2023 2.2  1.7 - 2.4 mg/dL Final   Performed at [facility], [address] [place], [place] [address]   Vitamin B-12 03/19/2023 398  180 - 914 pg/mL Final   Comment: (NOTE) This assay is not validated for testing neonatal or myeloproliferative syndrome specimens for Vitamin B12 levels. Performed at [facility], [address] [place], [place] [address]    SARSCOV2ONAVIRUS 2 AG 03/19/2023 NEGATIVE  NEGATIVE Final   Comment: (NOTE) SARS-CoV-2 antigen NOT DETECTED.   Negative results are presumptive.  Negative results do not preclude SARS-CoV-2 infection and should not be used as the sole basis for treatment or other patient management decisions, including infection  control decisions, particularly in the presence of clinical signs and  symptoms consistent with COVID-19, or in those who have been in contact with the virus.  Negative results must be combined with clinical observations, patient history, and epidemiological information. The expected result is Negative.  Fact Sheet for Patients: https://www.jennings-kim.com/  Fact Sheet for Healthcare Providers: https://alexander-rogers.biz/  This test is not yet approved or cleared by the [place] FDA and  has been authorized for detection and/or diagnosis of SARS-CoV-2 by FDA under an Emergency Use Authorization (EUA).  This EUA will remain in effect (meaning this test can be used) for the duration of  the COV                          ID-19 declaration under Section 564(b)(1) of the Act, 21 U.S.C. section  360bbb-3(b)(1), unless the authorization is terminated or revoked  sooner.     Sodium 03/22/2023 135  135 - 145 mmol/L Final   Potassium 03/22/2023 3.6  3.5 - 5.1 mmol/L Final   Chloride 03/22/2023 99  98 - 111 mmol/L Final   CO2 03/22/2023 27  22 - 32 mmol/L Final   Glucose, Bld 03/22/2023 97  70 - 99 mg/dL Final   Glucose reference range applies only to samples taken after fasting for at least 8 hours.   BUN 03/22/2023 22  8 - 23 mg/dL Final   Creatinine, Ser 03/22/2023 0.72  0.44 - 1.00 mg/dL Final   Calcium 16/09/9603 9.4  8.9 - 10.3 mg/dL Final   Total Protein 54/08/8118 6.9  6.5 - 8.1 g/dL Final   Albumin 14/78/2956 3.8  3.5 - 5.0 g/dL Final   AST 21/30/8657 16  15 - 41 U/L Final   ALT 03/22/2023 18  0 - 44 U/L Final   Alkaline Phosphatase 03/22/2023 63  38 - 126 U/L Final   Total Bilirubin 03/22/2023 0.7  0.3 - 1.2 mg/dL Final   GFR, Estimated 03/22/2023 >60  >60 mL/min Final   Comment: (NOTE) Calculated using the CKD-EPI Creatinine Equation (2021)    Anion gap 03/22/2023 9  5 - 15 Final   Performed at [facility], [address] [place], [place] [address]  Admission on 02/11/2023, Discharged on 02/17/2023  Component Date Value Ref Range Status   Alcohol, Ethyl (B) 02/11/2023 72 (H)  <10 mg/dL Final   Comment: (NOTE) Lowest detectable limit for serum alcohol is 10 mg/dL.  For medical purposes only. Performed at [facility], [address] [place], [place] [address]    Sodium 02/11/2023 136  135 - 145 mmol/L Final   Potassium 02/11/2023 3.2 (L)  3.5 - 5.1 mmol/L Final   Chloride 02/11/2023 97 (L)  98 - 111 mmol/L Final   CO2 02/11/2023 24  22 - 32 mmol/L Final   Glucose, Bld 02/11/2023 97  70 - 99 mg/dL Final   Glucose reference range applies only to samples taken after fasting for at least 8 hours.   BUN 02/11/2023 7 (L)  8 - 23 mg/dL Final   Creatinine, Ser 02/11/2023 0.47  0.44 - 1.00 mg/dL Final   Calcium 41/32/4401 9.2  8.9 - 10.3 mg/dL Final   Total Protein 02/72/5366 7.7  6.5 - 8.1 g/dL Final   Albumin  44/02/4741 4.7  3.5 - 5.0 g/dL Final   AST 59/56/3875 35  15 - 41 U/L Final   ALT 02/11/2023 34  0 - 44 U/L Final   Alkaline Phosphatase 02/11/2023 91  38 - 126 U/L Final   Total Bilirubin 02/11/2023 1.7 (H)  0.3 - 1.2 mg/dL Final   GFR, Estimated 02/11/2023 >60  >60 mL/min Final   Comment: (NOTE) Calculated using the CKD-EPI Creatinine Equation (2021)    Anion gap 02/11/2023 15  5 - 15 Final   Performed at [facility], [address], [place], [place] [address]   WBC 02/11/2023 9.2  4.0 - 10.5 K/uL Final   RBC 02/11/2023 4.23  3.87 - 5.11 MIL/uL Final   Hemoglobin 02/11/2023 13.4  12.0 - 15.0 g/dL Final   HCT 95/18/8416 38.8  36.0 - 46.0 % Final   MCV 02/11/2023 91.7  80.0 - 100.0 fL Final   MCH 02/11/2023 31.7  26.0 - 34.0 pg Final   MCHC 02/11/2023 34.5  30.0 - 36.0 g/dL Final  RDW 02/11/2023 13.0  11.5 - 15.5 % Final   Platelets 02/11/2023 422 (H)  150 - 400 K/uL Final   nRBC 02/11/2023 0.0  0.0 - 0.2 % Final   Performed at [facility], [address], [place], [place] [address]   Magnesium 02/11/2023 2.0  1.7 - 2.4 mg/dL Final   Performed at [facility], [address], [place], [place] [address]   Phosphorus 02/11/2023 3.7  2.5 - 4.6 mg/dL Final   Performed at [facility], [address] [place], [place] [address]   Vitamin B-12 02/11/2023 252  180 - 914 pg/mL Final   Comment: (NOTE) This assay is not validated for testing neonatal or myeloproliferative syndrome specimens for Vitamin B12 levels. Performed at [facility], [address], [place], [place] [address]    Folate 02/11/2023 6.3  >5.9 ng/mL Final   Performed at [facility], [address] [place], [place] [address]   TSH 02/11/2023 3.115  0.350 - 4.500 uIU/mL Final   Comment: Performed by a 3rd Generation assay with a functional sensitivity of <=0.01 uIU/mL. Performed at [facility], [address], [place], [place] [address]    SARS Coronavirus 2 by RT PCR  02/11/2023 NEGATIVE  NEGATIVE Final   Influenza A by PCR 02/11/2023 NEGATIVE  NEGATIVE Final   Influenza B by PCR 02/11/2023 NEGATIVE  NEGATIVE Final   Comment: (NOTE) The Xpert Xpress SARS-CoV-2/FLU/RSV plus assay is intended as an aid in the diagnosis of influenza from Nasopharyngeal swab specimens and should not be used as a sole basis for treatment. Nasal washings and aspirates are unacceptable for Xpert Xpress SARS-CoV-2/FLU/RSV testing.  Fact Sheet for Patients: BloggerCourse.com  Fact Sheet for Healthcare Providers: SeriousBroker.it  This test is not yet approved or cleared by the [place] FDA and has been authorized for detection and/or diagnosis of SARS-CoV-2 by FDA under an Emergency Use Authorization (EUA). This EUA will remain in effect (meaning this test can be used) for the duration of the COVID-19 declaration under Section 564(b)(1) of the Act, 21 U.S.C. section 360bbb-3(b)(1), unless the authorization is terminated or revoked.     Resp Syncytial Virus by PCR 02/11/2023 NEGATIVE  NEGATIVE Final   Comment: (NOTE) Fact Sheet for Patients: BloggerCourse.com  Fact Sheet for Healthcare Providers: SeriousBroker.it  This test is not yet approved or cleared by the [place] FDA and has been authorized for detection and/or diagnosis of SARS-CoV-2 by FDA under an Emergency Use Authorization (EUA). This EUA will remain in effect (meaning this test can be used) for the duration of the COVID-19 declaration under Section 564(b)(1) of the Act, 21 U.S.C. section 360bbb-3(b)(1), unless the authorization is terminated or revoked.  Performed at [facility], [address] [place], [place] [address]    Potassium 02/14/2023 4.2  3.5 - 5.1 mmol/L Final   Performed at [facility], [address] [place], [place] [address]    Allergies: Patient has no known  allergies.  Medications:  Facility Ordered Medications  Medication   [COMPLETED] sodium chloride 0.9 % bolus 1,000 mL   [COMPLETED] ondansetron (ZOFRAN) injection 4 mg   [COMPLETED] LORazepam (ATIVAN) tablet 1 mg   acetaminophen (TYLENOL) tablet 650 mg   alum & mag hydroxide-simeth (MAALOX/MYLANTA) 200-200-20 MG/5ML suspension 30 mL   magnesium hydroxide (MILK OF MAGNESIA) suspension 30 mL   [COMPLETED] thiamine (VITAMIN B1) injection 100 mg   multivitamin with minerals tablet  1 tablet   chlordiazePOXIDE (LIBRIUM) capsule 25 mg   hydrOXYzine (ATARAX) tablet 25 mg   loperamide (IMODIUM) capsule 2-4 mg   ondansetron (ZOFRAN-ODT) disintegrating tablet 4 mg   chlordiazePOXIDE (LIBRIUM) capsule 25 mg   Followed by   Melene Muller ON 05/27/2023] chlordiazePOXIDE (LIBRIUM) capsule 25 mg   Followed by   Melene Muller ON 05/28/2023] chlordiazePOXIDE (LIBRIUM) capsule 25 mg   Followed by   Melene Muller ON 05/29/2023] chlordiazePOXIDE (LIBRIUM) capsule 25 mg   amLODipine (NORVASC) tablet 10 mg   traZODone (DESYREL) tablet 100 mg   busPIRone (BUSPAR) tablet 5 mg   mirtazapine (REMERON) tablet 7.5 mg   [START ON 05/26/2023] losartan (COZAAR) tablet 100 mg   And   [START ON 05/26/2023] hydrochlorothiazide (HYDRODIURIL) tablet 12.5 mg   PTA Medications  Medication Sig   losartan-hydrochlorothiazide (HYZAAR) 100-12.5 MG tablet Take 1 tablet by mouth daily.   Cyanocobalamin (VITAMIN B-12 PO) Take 1 tablet by mouth daily.   naltrexone (DEPADE) 50 MG tablet Take 50 mg by mouth daily.   amLODipine (NORVASC) 10 MG tablet Take 1 tablet (10 mg total) by mouth daily.   hydrOXYzine (ATARAX) 25 MG tablet Take 1 tablet (25 mg total) by mouth 2 (two) times daily as needed for anxiety.   busPIRone (BUSPAR) 15 MG tablet Take 1 tablet (15 mg total) by mouth 2 (two) times daily.   mirtazapine (REMERON) 7.5 MG tablet Take 1 tablet (7.5 mg total) by mouth at bedtime.   hydrOXYzine (VISTARIL) 25 MG capsule Take 1 capsule (25 mg total)  by mouth 3 (three) times daily as needed.   traZODone (DESYREL) 100 MG tablet Take 1 tablet (100 mg total) by mouth at bedtime as needed for sleep. Take 100mg - 150mg  by mouth at bedtime as needed.    Long Term Goals: Improvement in symptoms so as ready for discharge  Short Term Goals: Patient will verbalize feelings in meetings with treatment team members., Patient will attend at least of 50% of the groups daily., Pt will complete the PHQ9 on admission, day 3 and discharge., Patient will participate in completing the [place] Suicide Severity Rating Scale, Patient will score a low risk of violence for 24 hours prior to discharge, and Patient will take medications as prescribed daily.  Medical Decision Making  Stephanie Riley was admitted to [facility] facility based crisis unit under the service of Nelly Rout, MD for Alcohol use disorder, crisis management, and stabilization. Routine labs ordered, which include Lab Orders         Hemoglobin A1c         Magnesium         Ethanol         Lipid panel         TSH         POCT Urine Drug Screen - (I-Screen)    Labs reviewed from 05/25/23 obtained at [facility]: CMP, CBC w/ differential, urinalysis, EKG Medication Management: Medications started; home medications restarted as appropriate; initiate CIWA protocol with Librium taper Meds ordered this encounter  Medications   acetaminophen (TYLENOL) tablet 650 mg   alum & mag hydroxide-simeth (MAALOX/MYLANTA) 200-200-20 MG/5ML suspension 30 mL   magnesium hydroxide (MILK OF MAGNESIA) suspension 30 mL   thiamine (VITAMIN B1) injection 100 mg   multivitamin with minerals tablet 1 tablet   chlordiazePOXIDE (LIBRIUM) capsule 25 mg   hydrOXYzine (ATARAX) tablet 25 mg   loperamide (IMODIUM) capsule 2-4 mg   ondansetron (ZOFRAN-ODT) disintegrating tablet 4 mg  FOLLOWED BY Linked Order Group    chlordiazePOXIDE (LIBRIUM) capsule 25 mg    chlordiazePOXIDE (LIBRIUM) capsule  25 mg    chlordiazePOXIDE (LIBRIUM) capsule 25 mg    chlordiazePOXIDE (LIBRIUM) capsule 25 mg   amLODipine (NORVASC) tablet 10 mg   traZODone (DESYREL) tablet 100 mg   DISCONTD: losartan-hydrochlorothiazide (HYZAAR) 100-12.5 MG per tablet 1 tablet   busPIRone (BUSPAR) tablet 5 mg   mirtazapine (REMERON) tablet 7.5 mg   AND Linked Order Group    losartan (COZAAR) tablet 100 mg    hydrochlorothiazide (HYDRODIURIL) tablet 12.5 mg    Will maintain observation checks every 15 minutes for safety. Psychosocial education regarding relapse prevention and self-care; social and communication  Social work will consult with family for collateral information and discuss discharge and follow up plan.   Recommendations  Based on my evaluation the patient does not appear to have an emergency medical condition.   Stephanie Corn, NP 05/25/23  6:38 PM

## 2023-05-25 NOTE — ED Triage Notes (Signed)
Pt to ED via EMS from home with c/o alcohol withdrawal symptoms. Per EMS pt stated her last drink was yesterday. Pt reports she has stopped taking all of her medications and is withdrawing from them. Pt is Aox4 in triage, actively vomiting. Pt denies any pain or injuries. Pt reports she drinks half of bottle of wine daily for years.

## 2023-05-25 NOTE — ED Notes (Signed)
Patient discharge to [facility] 

## 2023-05-25 NOTE — ED Notes (Signed)
Pt standing at the sink naked drinking water out of a canister. Helped pt to the bed and put on a gown

## 2023-05-25 NOTE — ED Notes (Signed)
Patient denies SI and AVH. Patient reports she feels bad to be back here but she need to be here. Pt states [facility] is [facility] to [facility] in [facility] so she did not go. Patient reports she doesn't mind trying other residential facilities but doesn't want to be far from home. Patient was given dinner and administered her medication. Patient is being monitored for safety.

## 2023-05-25 NOTE — ED Notes (Signed)
Pt admitted to [facility]  .denies  SI/HI/AVH. Calm, cooperative throughout interview process. Skin assessment completed. Oriented to unit. Meal and drink offered. At currrent, pt continue to deny SI/HI/AVH. Pt verbally contract for safety. Will monitor for safety.

## 2023-05-25 NOTE — ED Notes (Signed)
Patient is sleeping. Respirations equal and unlabored, skin warm and dry. No change in assessment or acuity. Routine safety checks conducted according to facility protocol. Will continue to monitor for safety.   

## 2023-05-25 NOTE — ED Notes (Signed)
Patient was admitted to [facility]. Patient denies SI/HI and AVH. Patient completed her admission assessment. Patient was given dinner and her medications. Patient is in a good mood she is pleasant with staff and the other patients. Patient is being monitored for safety.

## 2023-05-25 NOTE — BH Assessment (Signed)
Comprehensive Clinical Assessment (CCA) Note  05/25/2023 Aaralynn Buller Segall [id number]  Disposition: Per Erskine Emery, NP admission to [facility] is recommended.   The patient demonstrates the following risk factors for suicide: Chronic risk factors for suicide include: substance use disorder. Acute risk factors for suicide include: family or marital conflict and social withdrawal/isolation. Protective factors for this patient include: positive social support, coping skills, and hope for the future. Considering these factors, the overall suicide risk at this point appears to be low. Patient is appropriate for outpatient follow up, once stabilized.   Per EDP note from patient's ED visit this morning: "AIRYONNA BEVARD is a 72 y.o. female with past medical history as below, significant for alcohol abuse, hypertension, benzo abuse who presents to the ED with complaint of nausea, alcohol abuse.  Patient reports that she ran over her medications 3 days ago, she tried to get refill with her PCP but they were unable to fill refill request per patient.  She drinks alcohol daily, approximately one half bottle of wine.  Per EMS patient was drinking a Heineken beer when they picked her up this morning.  Patient has chronic diarrhea, unchanged from her baseline.  Reports nausea over the past few days, poor appetite, poor p.o. intake last 24 hours.  No vomiting.  No SI or HI.  Seeking refills of her home medications."   It appears patient was referred to [facility], as she presents directly from ED in hospital scrubs.  Patient reports she has been drinking for "many years."  She presents today, as noted above, for assistance with ongoing alcohol issues. Patient reports drinking approximately a 1/2 bottle of wine daily.  She was reportedly drinking when EMS responded to her call this morning. She is reporting nausea on arrival. Patient is agreeable with recommendation for admission to [facility] for detox.    Chief Complaint:  Chief  Complaint  Patient presents with   Alcohol Problem   Visit Diagnosis: Alcohol Use Disorder, severe    CCA Screening, Triage and Referral (STR)  Patient Reported Information How did you hear about [place]? Hospital Discharge  What Is the Reason for Your Visit/Call Today? Patient is a 72 year old female that presents voluntary this date as a walk in to [facility] requesting assistance with ongoing alcohol issues. Pt reports she was discharged from [facility] prior to coming to this facility for alcohol detox. Pt reports she has been drinking since she was in her 58's. Pt reports nausea today.Pt denies SI/HI and AVH.  How Long Has This Been Causing You Problems? > than 6 months  What Do You Feel Would Help You the Most Today? Alcohol or Drug Use Treatment   Have You Recently Had Any Thoughts About Hurting Yourself? No  Are You Planning to Commit Suicide/Harm Yourself At This time? No   Flowsheet Row ED from 05/25/2023 in [facility] Most recent reading at 05/25/2023  4:27 PM ED from 05/25/2023 in [facility] at [facility] Most recent reading at 05/25/2023 11:00 AM ED from 03/19/2023 in [facility] Most recent reading at 03/19/2023  4:18 PM  C-SSRS RISK CATEGORY No Risk No Risk No Risk       Have you Recently Had Thoughts About Hurting Someone [name]? No  Are You Planning to Harm Someone at This Time? No  Explanation: N/A   Have You Used Any Alcohol or Drugs in the Past 24 Hours? Yes  What Did You Use  and How Much? 1/2 bottle of wine.   Do You Currently Have a Therapist/Psychiatrist? Yes  Name of Therapist/Psychiatrist: Name of Therapist/Psychiatrist: [facility]   Have You Been Recently Discharged From Any Office Practice or Programs? No  Explanation of Discharge From Practice/Program: N/A     CCA Screening Triage Referral Assessment Type of Contact: Face-to-Face  Telemedicine Service Delivery:   Is this  Initial or Reassessment?   Date Telepsych consult ordered in [facility]:    Time Telepsych consult ordered in [facility]:    Location of Assessment: [facility]  Provider Location: [facility] Assessment Services   Collateral Involvement: N/A   Does Patient Have a Automotive engineer Guardian? No  Legal Guardian Contact Information: N/A  Copy of Legal Guardianship Form: -- (N/A)  Legal Guardian Notified of Arrival: -- (N/A)  Legal Guardian Notified of Pending Discharge: -- (N/A)  If Minor and Not Living with Parent(s), Who has Custody? N/A  Is CPS involved or ever been involved? Never  Is APS involved or ever been involved? Never   Patient Determined To Be At Risk for Harm To Self or Others Based on Review of Patient Reported Information or Presenting Complaint? No  Method: -- (N/A, no HI)  Availability of Means: -- (N/A, no HI)  Intent: -- (N/A, no HI)  Notification Required: -- (N/A, no HI)  Additional Information for Danger to Others Potential: -- (N/A, no HI)  Additional Comments for Danger to Others Potential: N/A, no HI  Are There Guns or Other Weapons in Your Home? No  Types of Guns/Weapons: N/A  Are These Weapons Safely Secured?                            -- (N/A)  Who Could Verify You Are Able To Have These Secured: N/A  Do You Have any Outstanding Charges, Pending Court Dates, Parole/Probation? Denies  Contacted To Inform of Risk of Harm To Self or Others: -- (N/A, no HI)    Does Patient Present under Involuntary Commitment? No    [place] of Residence: [place]   Patient Currently Receiving the Following Services: Individual Therapy   Determination of Need: Urgent (48 hours)   Options For Referral: Facility-Based Crisis     CCA Biopsychosocial Patient Reported Schizophrenia/Schizoaffective Diagnosis in Past: No   Strengths: Patient is willing to participate in treatment   Mental Health Symptoms Depression:   Difficulty  Concentrating; Hopelessness   Duration of Depressive symptoms:  Duration of Depressive Symptoms: Greater than two weeks   Mania:   None   Anxiety:    Difficulty concentrating   Psychosis:   None   Duration of Psychotic symptoms:    Trauma:   None   Obsessions:   None   Compulsions:   None   Inattention:   None   Hyperactivity/Impulsivity:   N/A   Oppositional/Defiant Behaviors:   N/A   Emotional Irregularity:   None; N/A   Other Mood/Personality Symptoms:   NA    Mental Status Exam Appearance and self-care  Stature:   Average   Weight:   Average weight   Clothing:   Casual   Grooming:   Normal   Cosmetic use:   None   Posture/gait:   Normal   Motor activity:   Not Remarkable   Sensorium  Attention:   Normal   Concentration:   Normal   Orientation:   X5   Recall/memory:   Normal  Affect and Mood  Affect:   Anxious   Mood:   Anxious   Relating  Eye contact:   Normal   Facial expression:   Anxious   Attitude toward examiner:   Cooperative   Thought and Language  Speech flow:  Clear and Coherent   Thought content:   Appropriate to Mood and Circumstances   Preoccupation:   None   Hallucinations:   None   Organization:   Goal-directed; Coherent   [employer] of Knowledge:   Fair   Intelligence:   Average   Abstraction:   Normal   Judgement:   Fair   Dance movement psychotherapist:   Adequate   Insight:   Fair   Decision Making:   Normal   Social Functioning  Social Maturity:   Responsible   Social Judgement:   Normal   Stress  Stressors:   Other (Comment) (Ongoing SA issues)   Coping Ability:   Overwhelmed   Skill Deficits:   None   Supports:   Usual     Religion: Religion/Spirituality Are You A Religious Person?: No How Might This Affect Treatment?: NA  Leisure/Recreation: Leisure / Recreation Do You Have Hobbies?: No  Exercise/Diet: Exercise/Diet Do You  Exercise?: No Have You Gained or Lost A Significant Amount of Weight in the Past Six Months?: No Do You Follow a Special Diet?: No Do You Have Any Trouble Sleeping?: Yes Explanation of Sleeping Difficulties: varies   CCA Employment/Education Employment/Work Situation: Employment / Work Situation Employment Situation: Unemployed Patient's Job has Been Impacted by Current Illness: No Has Patient ever Been in Equities trader?: No  Education: Education Is Patient Currently Attending School?: No Last Grade Completed: 12 Did You Product manager?: No Did You Have An Individualized Education Program (IIEP): No Did You Have Any Difficulty At [employer]?: No Patient's Education Has Been Impacted by Current Illness: No   CCA Family/Childhood History Family and Relationship History: Family history Marital status:  (NA) Does patient have children?: No  Childhood History:  Childhood History By whom was/is the patient raised?: Both parents Did patient suffer any verbal/emotional/physical/sexual abuse as a child?: No Did patient suffer from severe childhood neglect?: No Has patient ever been sexually abused/assaulted/raped as an adolescent or adult?: No Was the patient ever a victim of a crime or a disaster?: No Witnessed domestic violence?: No Has patient been affected by domestic violence as an adult?: No       CCA Substance Use Alcohol/Drug Use: Alcohol / Drug Use Pain Medications: See MAR Prescriptions: See MAR Over the Counter: See MAR History of alcohol / drug use?: Yes Longest period of sobriety (when/how long): Unknown Negative Consequences of Use: Personal relationships Withdrawal Symptoms: Irritability Substance #1 Name of Substance 1: ETOH 1 - Age of First Use: 20s 1 - Amount (size/oz): varies 1 - Frequency: daily 1 - Duration: unknown 1 - Last Use / Amount: yesterday-1/2 bottle of wine 1 - Method of Aquiring: NA 1- Route of Use: drinks                        ASAM's:  Six Dimensions of Multidimensional Assessment  Dimension 1:  Acute Intoxication and/or Withdrawal Potential:   Dimension 1:  Description of individual's past and current experiences of substance use and withdrawal: Moderate risk of withdrawals based on assessment.  Dimension 2:  Biomedical Conditions and Complications:   Dimension 2:  Description of patient's biomedical conditions and  complications: NA  Dimension  3:  Emotional, Behavioral, or Cognitive Conditions and Complications:  Dimension 3:  Description of emotional, behavioral, or cognitive conditions and complications: Limited awareness of MH and SA issues  Dimension 4:  Readiness to Change:  Dimension 4:  Description of Readiness to Change criteria: Willing to enter treatment and explore options to maintain her sobriety  Dimension 5:  Relapse, Continued use, or Continued Problem Potential:  Dimension 5:  Relapse, continued use, or continued problem potential critiera description: Long history of alcohol use, hx of relapses  Dimension 6:  Recovery/Living Environment:  Dimension 6:  Recovery/Iiving environment criteria description: NA  ASAM Severity Score: ASAM's Severity Rating Score: 5  ASAM Recommended Level of Treatment: ASAM Recommended Level of Treatment: Level III Residential Treatment   Substance use Disorder (SUD) Substance Use Disorder (SUD)  Checklist Symptoms of Substance Use: Continued use despite having a persistent/recurrent physical/psychological problem caused/exacerbated by use, Recurrent use that results in a failure to fulfill major role obligations (work, school, home), Persistent desire or unsuccessful efforts to cut down or control use, Social, occupational, recreational activities given up or reduced due to use  Recommendations for Services/Supports/Treatments: Recommendations for Services/Supports/Treatments Recommendations For Services/Supports/Treatments: Facility Based Crisis  Discharge  Disposition:    DSM5 Diagnoses: Patient Active Problem List   Diagnosis Date Noted   Alcohol use disorder 03/19/2023   Alcohol use disorder, moderate, dependence (HCC) 02/12/2023   Benzodiazepine withdrawal without complication (HCC) 02/12/2023     Referrals to Alternative Service(s): Referred to Alternative Service(s):   Place:   Date:   Time:    Referred to Alternative Service(s):   Place:   Date:   Time:    Referred to Alternative Service(s):   Place:   Date:   Time:    Referred to Alternative Service(s):   Place:   Date:   Time:     Yetta Glassman, [facility]

## 2023-05-25 NOTE — ED Provider Notes (Signed)
EMERGENCY DEPARTMENT AT [facility] Provider Note  CSN: [id number] Arrival date & time: 05/25/23 1048  Chief Complaint(s) Withdrawal  HPI Stephanie Riley is a 72 y.o. female with past medical history as below, significant for alcohol abuse, hypertension, benzo abuse who presents to the ED with complaint of nausea, alcohol abuse.  Patient reports that she ran over her medications 3 days ago, she tried to get refill with her PCP but they were unable to fill refill request per patient.  She drinks alcohol daily, approximately one half bottle of wine.  Per EMS patient was drinking a [employer]ine[employer] beer when they picked her up this morning.  Patient has chronic diarrhea, unchanged her baseline.  Reports nausea over the past few days, poor appetite, poor p.o. intake last 24 hours.  No vomiting.  No SI or HI.  Seeking refills of her home medications.      Current Outpatient Medications (Cardiovascular):    amLODipine (NORVASC) 10 MG tablet, Take 1 tablet (10 mg total) by mouth daily for 7 days.   losartan-hydrochlorothiazide (HYZAAR) 100-12.5 MG tablet, Take 1 tablet by mouth daily for 7 days.   amLODipine (NORVASC) 10 MG tablet, Take 1 tablet (10 mg total) by mouth daily.   losartan-hydrochlorothiazide (HYZAAR) 100-12.5 MG tablet, Take 1 tablet by mouth daily.  Facility-Administered Medications Ordered in Other Encounters (Cardiovascular):    amLODipine (NORVASC) tablet 10 mg   losartan (COZAAR) tablet 100 mg **AND** hydrochlorothiazide (HYDRODIURIL) tablet 12.5 mg      Facility-Administered Medications Ordered in Other Encounters (Analgesics):    acetaminophen (TYLENOL) tablet 650 mg   Current Outpatient Medications (Hematological):    Cyanocobalamin (VITAMIN B-12 PO), Take 1 tablet by mouth daily.   Current Outpatient Medications (Other):    busPIRone (BUSPAR) 15 MG tablet, Take 1 tablet (15 mg total) by mouth 2 (two) times daily for 7 days.   hydrOXYzine (ATARAX)  25 MG tablet, Take 1 tablet (25 mg total) by mouth 2 (two) times daily as needed for up to 14 days.   mirtazapine (REMERON) 7.5 MG tablet, Take 1 tablet (7.5 mg total) by mouth at bedtime for 7 days.   naltrexone (DEPADE) 50 MG tablet, Take 1 tablet (50 mg total) by mouth daily for 7 days.   traZODone (DESYREL) 100 MG tablet, Take 1 tablet (100 mg total) by mouth at bedtime for 7 days.   busPIRone (BUSPAR) 15 MG tablet, Take 1 tablet (15 mg total) by mouth 2 (two) times daily.   hydrOXYzine (ATARAX) 25 MG tablet, Take 1 tablet (25 mg total) by mouth 2 (two) times daily as needed for anxiety.   hydrOXYzine (VISTARIL) 25 MG capsule, Take 1 capsule (25 mg total) by mouth 3 (three) times daily as needed.   mirtazapine (REMERON) 7.5 MG tablet, Take 1 tablet (7.5 mg total) by mouth at bedtime.   naltrexone (DEPADE) 50 MG tablet, Take 50 mg by mouth daily.   traZODone (DESYREL) 100 MG tablet, Take 1 tablet (100 mg total) by mouth at bedtime as needed for sleep. Take 100mg - 150mg  by mouth at bedtime as needed.  Facility-Administered Medications Ordered in Other Encounters (Other):    alum & mag hydroxide-simeth (MAALOX/MYLANTA) 200-200-20 MG/5ML suspension 30 mL   busPIRone (BUSPAR) tablet 5 mg   chlordiazePOXIDE (LIBRIUM) capsule 25 mg   chlordiazePOXIDE (LIBRIUM) capsule 25 mg **IN FOLLOWED-BY LINKED GROUP WITH** [START ON 05/27/2023] chlordiazePOXIDE (LIBRIUM) capsule 25 mg **IN FOLLOWED-BY LINKED GROUP WITH** [START ON 05/28/2023] chlordiazePOXIDE (LIBRIUM) capsule 25  mg **IN FOLLOWED-BY LINKED GROUP WITH** [START ON 05/29/2023] chlordiazePOXIDE (LIBRIUM) capsule 25 mg   hydrOXYzine (ATARAX) tablet 25 mg   loperamide (IMODIUM) capsule 2-4 mg   magnesium hydroxide (MILK OF MAGNESIA) suspension 30 mL   mirtazapine (REMERON) tablet 7.5 mg   multivitamin with minerals tablet 1 tablet   ondansetron (ZOFRAN-ODT) disintegrating tablet 4 mg   traZODone (DESYREL) tablet 100 mg No current facility-administered  medications for this encounter.     Past Medical History Past Medical History:  Diagnosis Date   Alcohol abuse    HTN (hypertension)    Patient Active Problem List   Diagnosis Date Noted   Alcohol use disorder 03/19/2023   Alcohol use disorder, moderate, dependence (HCC) 02/12/2023   Benzodiazepine withdrawal without complication (HCC) 02/12/2023   Home Medication(s) Prior to Admission medications   Medication Sig Start Date End Date Taking? Authorizing Provider  amLODipine (NORVASC) 10 MG tablet Take 1 tablet (10 mg total) by mouth daily for 7 days. 05/25/23 06/01/23 Yes Sloan Leiter, DO  busPIRone (BUSPAR) 15 MG tablet Take 1 tablet (15 mg total) by mouth 2 (two) times daily for 7 days. 05/25/23 06/01/23 Yes Sloan Leiter, DO  hydrOXYzine (ATARAX) 25 MG tablet Take 1 tablet (25 mg total) by mouth 2 (two) times daily as needed for up to 14 days. 05/25/23 06/08/23 Yes Sloan Leiter, DO  losartan-hydrochlorothiazide (HYZAAR) 100-12.5 MG tablet Take 1 tablet by mouth daily for 7 days. 05/25/23 06/01/23 Yes Tanda Rockers A, DO  mirtazapine (REMERON) 7.5 MG tablet Take 1 tablet (7.5 mg total) by mouth at bedtime for 7 days. 05/25/23 06/01/23 Yes Tanda Rockers A, DO  naltrexone (DEPADE) 50 MG tablet Take 1 tablet (50 mg total) by mouth daily for 7 days. 05/25/23 06/01/23 Yes Sloan Leiter, DO  traZODone (DESYREL) 100 MG tablet Take 1 tablet (100 mg total) by mouth at bedtime for 7 days. 05/25/23 06/01/23 Yes Tanda Rockers A, DO  amLODipine (NORVASC) 10 MG tablet Take 1 tablet (10 mg total) by mouth daily. 03/26/23 04/25/23  Lamar Sprinkles, MD  busPIRone (BUSPAR) 15 MG tablet Take 1 tablet (15 mg total) by mouth 2 (two) times daily. 04/01/23   [name], MD  Cyanocobalamin (VITAMIN B-12 PO) Take 1 tablet by mouth daily.    [provider]  hydrOXYzine (ATARAX) 25 MG tablet Take 1 tablet (25 mg total) by mouth 2 (two) times daily as needed for anxiety. 03/26/23   Lamar Sprinkles, MD   hydrOXYzine (VISTARIL) 25 MG capsule Take 1 capsule (25 mg total) by mouth 3 (three) times daily as needed. 04/01/23   [name], MD  losartan-hydrochlorothiazide (HYZAAR) 100-12.5 MG tablet Take 1 tablet by mouth daily. 04/28/17   [provider]  mirtazapine (REMERON) 7.5 MG tablet Take 1 tablet (7.5 mg total) by mouth at bedtime. 04/01/23 05/01/23  [name], MD  naltrexone (DEPADE) 50 MG tablet Take 50 mg by mouth daily. 03/18/23 03/17/24  [provider]  traZODone (DESYREL) 100 MG tablet Take 1 tablet (100 mg total) by mouth at bedtime as needed for sleep. Take 100mg - 150mg  by mouth at bedtime as needed. 04/01/23   [name], MD  Past Surgical History History reviewed. No pertinent surgical history. Family History History reviewed. No pertinent family history.  Social History Social History   Tobacco Use   Smoking status: Never   Smokeless tobacco: Never  Substance Use Topics   Alcohol use: Yes   Drug use: Never   Allergies Patient has no known allergies.  Review of Systems Review of Systems  Constitutional:  Positive for appetite change. Negative for activity change and fever.  HENT:  Negative for facial swelling and trouble swallowing.   Eyes:  Negative for discharge and redness.  Respiratory:  Negative for cough and shortness of breath.   Cardiovascular:  Negative for chest pain and palpitations.  Gastrointestinal:  Positive for diarrhea and nausea. Negative for abdominal pain.  Genitourinary:  Negative for dysuria and flank pain.  Musculoskeletal:  Negative for back pain and gait problem.  Skin:  Negative for pallor and rash.  Neurological:  Negative for syncope and headaches.    Physical Exam Vital Signs  I have reviewed the triage vital signs BP (!) 152/80   Pulse 90   Temp 98.3 F (36.8 C) (Oral)    Resp 16   Ht 5\' 6"  (1.676 m)   Wt 67.1 kg   SpO2 100%   BMI 23.89 kg/m  Physical Exam Vitals and nursing note reviewed.  Constitutional:      General: She is not in acute distress.    Appearance: Normal appearance.  HENT:     Head: Normocephalic and atraumatic.     Right Ear: External ear normal.     Left Ear: External ear normal.     Nose: Nose normal.     Mouth/Throat:     Mouth: Mucous membranes are moist.  Eyes:     General: No scleral icterus.       Right eye: No discharge.        Left eye: No discharge.  Cardiovascular:     Rate and Rhythm: Normal rate and regular rhythm.     Pulses: Normal pulses.     Heart sounds: Normal heart sounds.  Pulmonary:     Effort: Pulmonary effort is normal. No respiratory distress.     Breath sounds: Normal breath sounds.  Abdominal:     General: Abdomen is flat.     Palpations: Abdomen is soft.     Tenderness: There is no abdominal tenderness. There is no guarding or rebound.  Musculoskeletal:        General: Normal range of motion.     Right lower leg: No edema.     Left lower leg: No edema.  Skin:    General: Skin is warm and dry.     Capillary Refill: Capillary refill takes less than 2 seconds.  Neurological:     Mental Status: She is alert and oriented to person, place, and time.     GCS: GCS eye subscore is 4. GCS verbal subscore is 5. GCS motor subscore is 6.  Psychiatric:        Mood and Affect: Mood normal.        Behavior: Behavior normal.     ED Results and Treatments Labs (all labs ordered are listed, but only abnormal results are displayed) Labs Reviewed  COMPREHENSIVE METABOLIC PANEL - Abnormal; Notable for the following components:      Result Value   Chloride 96 (*)    Glucose, Bld 121 (*)    AST 105 (*)    ALT 62 (*)  Total Bilirubin 2.0 (*)    Anion gap 19 (*)    All other components within normal limits  LIPASE, BLOOD - Abnormal; Notable for the following components:   Lipase 52 (*)    All other  components within normal limits  I-STAT VENOUS BLOOD GAS, ED - Abnormal; Notable for the following components:   pCO2, Ven 39.8 (*)    pO2, Ven 64 (*)    Sodium 134 (*)    Calcium, Ion 1.01 (*)    All other components within normal limits  CBC WITH DIFFERENTIAL/PLATELET  URINALYSIS, ROUTINE W REFLEX MICROSCOPIC  TROPONIN I (HIGH SENSITIVITY)  TROPONIN I (HIGH SENSITIVITY)                                                                                                                          Radiology DG Chest Portable 1 View  Result Date: 05/25/2023 CLINICAL DATA:  Alcohol withdrawal. EXAM: PORTABLE CHEST 1 VIEW COMPARISON:  None Available. FINDINGS: Numerous leads and wires project over the chest. Moderate right hemidiaphragm elevation. Midline trachea. Borderline cardiomegaly. No pleural effusion or pneumothorax. Clear lungs. IMPRESSION: No acute cardiopulmonary disease. Electronically Signed   By: Jeronimo Greaves M.D.   On: 05/25/2023 13:54    Pertinent labs & imaging results that were available during my care of the patient were reviewed by me and considered in my medical decision making (see MDM for details).  Medications Ordered in ED Medications  sodium chloride 0.9 % bolus 1,000 mL (0 mLs Intravenous Stopped 05/25/23 1539)  ondansetron (ZOFRAN) injection 4 mg (4 mg Intravenous Given 05/25/23 1113)  LORazepam (ATIVAN) tablet 1 mg (1 mg Oral Given 05/25/23 1517)                                                                                                                                     Procedures Procedures  (including critical care time)  Medical Decision Making / ED Course    Medical Decision Making:    ARAH BARO is a 72 y.o. female with past medical history as below, significant for alcohol abuse, hypertension, benzo abuse who presents to the ED with complaint of nausea, alcohol abuse.. The complaint involves an extensive differential diagnosis and also carries  with it a high risk of complications and morbidity.  Serious etiology was considered. Ddx includes but is not limited to: Alcohol withdrawal, medication effect,  gastritis, enteritis, viral infection, foodborne illness, etc.  Complete initial physical exam performed, notably the patient  was no acute distress, HDS,.    Reviewed and confirmed nursing documentation for past medical history, family history, social history.  Vital signs reviewed.    Clinical Course as of 05/26/23 0714  Tue May 25, 2023  1522 She does not appear to be in acute alcohol withdrawal at this time.  Patient reports that she is very anxious, will give one-time dose of Ativan.  Follow-up with behavioral urgent care in regards to outpatient detox options.  No current SI or HI.  Again does not appear to be acute alcohol withdrawal. [SG]    Clinical Course User Index [SG] Sloan Leiter, DO   Patient here with vague complaints, seems primarily that she is anxious and wants refill on medications also is seeking alcohol detox even though she is still drinking alcohol, she was drinking alcoholic beverage when EMS picked her up this morning.  Will give her one-time dose of Ativan.  Screening labs obtained.  She does not appear to be in acute alcohol withdrawal.  Follow-up outpatient with behavioral health urgent care.  Gave her 1 week refill on her medications.  Alcohol cessation.  No SI or HI.  The patient improved significantly and was discharged in stable condition. Detailed discussions were had with the patient regarding current findings, and need for close f/u with PCP or on call doctor. The patient has been instructed to return immediately if the symptoms worsen in any way for re-evaluation. Patient verbalized understanding and is in agreement with current care plan. All questions answered prior to discharge.          Additional history obtained: -Additional history obtained from ems -External records from outside source  obtained and reviewed including: Chart review including previous notes, labs, imaging, consultation notes including documentation, medications, prior labs and imaging   Lab Tests: -I ordered, reviewed, and interpreted labs.   The pertinent results include:   Labs Reviewed  COMPREHENSIVE METABOLIC PANEL - Abnormal; Notable for the following components:      Result Value   Chloride 96 (*)    Glucose, Bld 121 (*)    AST 105 (*)    ALT 62 (*)    Total Bilirubin 2.0 (*)    Anion gap 19 (*)    All other components within normal limits  LIPASE, BLOOD - Abnormal; Notable for the following components:   Lipase 52 (*)    All other components within normal limits  I-STAT VENOUS BLOOD GAS, ED - Abnormal; Notable for the following components:   pCO2, Ven 39.8 (*)    pO2, Ven 64 (*)    Sodium 134 (*)    Calcium, Ion 1.01 (*)    All other components within normal limits  CBC WITH DIFFERENTIAL/PLATELET  URINALYSIS, ROUTINE W REFLEX MICROSCOPIC  TROPONIN I (HIGH SENSITIVITY)  TROPONIN I (HIGH SENSITIVITY)    Notable for minimally elevated lipase, no epigastric pain.  No ketones in urine.  EKG   EKG Interpretation  Date/Time:    Ventricular Rate:    PR Interval:    QRS Duration:   QT Interval:    QTC Calculation:   R Axis:     Text Interpretation:           Imaging Studies ordered: I ordered imaging studies including cxr I independently visualized the following imaging with scope of interpretation limited to determining acute life threatening conditions related to emergency care;  findings noted above, significant for stable chest x-ray I independently visualized and interpreted imaging. I agree with the radiologist interpretation   Medicines ordered and prescription drug management: Meds ordered this encounter  Medications   sodium chloride 0.9 % bolus 1,000 mL   ondansetron (ZOFRAN) injection 4 mg   LORazepam (ATIVAN) tablet 1 mg   amLODipine (NORVASC) 10 MG tablet     Sig: Take 1 tablet (10 mg total) by mouth daily for 7 days.    Dispense:  7 tablet    Refill:  0   busPIRone (BUSPAR) 15 MG tablet    Sig: Take 1 tablet (15 mg total) by mouth 2 (two) times daily for 7 days.    Dispense:  14 tablet    Refill:  0   hydrOXYzine (ATARAX) 25 MG tablet    Sig: Take 1 tablet (25 mg total) by mouth 2 (two) times daily as needed for up to 14 days.    Dispense:  12 tablet    Refill:  0   mirtazapine (REMERON) 7.5 MG tablet    Sig: Take 1 tablet (7.5 mg total) by mouth at bedtime for 7 days.    Dispense:  7 tablet    Refill:  0   naltrexone (DEPADE) 50 MG tablet    Sig: Take 1 tablet (50 mg total) by mouth daily for 7 days.    Dispense:  7 tablet    Refill:  0   traZODone (DESYREL) 100 MG tablet    Sig: Take 1 tablet (100 mg total) by mouth at bedtime for 7 days.    Dispense:  7 tablet    Refill:  0   losartan-hydrochlorothiazide (HYZAAR) 100-12.5 MG tablet    Sig: Take 1 tablet by mouth daily for 7 days.    Dispense:  7 tablet    Refill:  0    -I have reviewed the patients home medicines and have made adjustments as needed   Consultations Obtained: na   Cardiac Monitoring: The patient was maintained on a cardiac monitor.  I personally viewed and interpreted the cardiac monitored which showed an underlying rhythm of: SR  Social Determinants of Health:  Diagnosis or treatment significantly limited by social determinants of health: alcohol use   Reevaluation: After the interventions noted above, I reevaluated the patient and found that they have improved  Co morbidities that complicate the patient evaluation  Past Medical History:  Diagnosis Date   Alcohol abuse    HTN (hypertension)       Dispostion: Disposition decision including need for hospitalization was considered, and patient discharged from emergency department.    Final Clinical Impression(s) / ED Diagnoses Final diagnoses:  Alcohol abuse  Anxiety     This chart was  dictated using voice recognition software.  Despite best efforts to proofread,  errors can occur which can change the documentation meaning.    Sloan Leiter, DO 05/26/23 [phone]

## 2023-05-25 NOTE — Progress Notes (Addendum)
   05/25/23 1625  [facility] Triage Screening (Walk-ins at [facility] only)  How Did You Hear About [place]? Hospital Discharge  What Is the Reason for Your Visit/Call Today? Patient is a 72 year old female that presents voluntary this date as a walk in to [facility] requesting assistance with ongoing alcohol issues. Pt reports she was discharged from [facility] prior to coming to this facility for alcohol detox. Pt reports she has been drinking since she was in her 68's. Pt reports nausea today.Pt denies SI/HI and AVH.  How Long Has This Been Causing You Problems? > than 6 months  Have You Recently Had Any Thoughts About Hurting Yourself? No  Are You Planning to Commit Suicide/Harm Yourself At This time? No  Have you Recently Had Thoughts About Hurting Someone [name]? No  Are You Planning To Harm Someone At This Time? No  Are you currently experiencing any auditory, visual or other hallucinations? No  Have You Used Any Alcohol or Drugs in the Past 24 Hours? Yes  How long ago did you use Drugs or Alcohol? yesterday  What Did You Use and How Much? 1/2 bottle of wine.  Do you have any current medical co-morbidities that require immediate attention? No  Clinician description of patient physical appearance/behavior: Wearing blue paper scrubs  What Do You Feel Would Help You the Most Today? Alcohol or Drug Use Treatment  If access to [facility] was not available, would you have sought care in the Emergency Department? No  Determination of Need Urgent (48 hours)  Options For Referral Facility-Based Crisis

## 2023-05-26 DIAGNOSIS — F1090 Alcohol use, unspecified, uncomplicated: Secondary | ICD-10-CM | POA: Diagnosis not present

## 2023-05-26 LAB — POCT URINE DRUG SCREEN - MANUAL ENTRY (I-SCREEN)
POC Amphetamine UR: NOT DETECTED
POC Buprenorphine (BUP): NOT DETECTED
POC Cocaine UR: NOT DETECTED
POC Marijuana UR: NOT DETECTED
POC Methadone UR: NOT DETECTED
POC Methamphetamine UR: NOT DETECTED
POC Morphine: NOT DETECTED
POC Oxazepam (BZO): POSITIVE — AB
POC Oxycodone UR: POSITIVE — AB
POC Secobarbital (BAR): NOT DETECTED

## 2023-05-26 NOTE — Progress Notes (Signed)
Pt was visible in the milieu and had been interacting with peer. No distress noted or concerns voiced. Staff will monitor for pt's safety.

## 2023-05-26 NOTE — ED Notes (Signed)
Patient is sleeping. Respirations equal and unlabored, skin warm and dry. No change in assessment or acuity. Routine safety checks conducted according to facility protocol. Will continue to monitor for safety.   

## 2023-05-26 NOTE — ED Provider Notes (Signed)
Behavioral Health Progress Note  Date and Time: 05/26/2023 8:21 AM Name: Stephanie Riley MRN:  [id number]  Subjective:  Stephanie Riley is a 72 y.o. female patient with a past psychiatric history of alcohol use disorder, MDD, GAD, benzodiazepine withdrawal with delirium, substance-induced mood disorder, insomnia, paranoia, adjustment disorder, and ADHD who presented voluntarily and unaccompanied to [facility] seeking alcohol detox, for which she was admitted to [facility].  On assessment today, patient reiterates that she has been drinking 0.5 bottle of wine daily. She continues to minimize that this is a problem, however, she is in agreement that she is in need of residential treatment. She has been working with her daughter on obtaining help, looking into assisted living communities, as her husband is no longer involved. We discuss the limitations to options with her insurance, and she voices understanding. She is against [facility] based on reviews she saw online. She voices that she would like to go to [facility], and she is advised that her husband was unwilling to pay for it based on her last admission, and she voiced understanding.   Patient voiced feeling nauseous, urinary incontinence, and dizziness. She denies worsening of her chronic diarrhea; improved with imodium. She endorses good sleep but poor appetite. She denies SI, HI, and AVH.   Diagnosis:  Final diagnoses:  Alcohol use disorder    Total Time spent with patient: 30 minutes  Past Psychiatric History:  OPT: Hx of Adderall, Zoloft, Trazodone, Wellbutrin, Xanax, Klonopin, no psychiatry opt 12/2022- INPT at [facility] , patient noted to have yellow/orange sclera on arrival, but cleared by discharge. Dx with Etoh use d/o,. MOCA was done due to cognitive concerns, scored 23/30. 2nd hosp in 2024- [facility], BZD withdrawal Dx: MDD, Etoh use disorder, ADHD, anxiety, and chronic prescribed BZD use. Therapy: marriage counseling Past  Medical History: HTN Family History: Both parents had Alzhmier's  Family Psychiatric  History: na Social History: -Living alone, separated from husband -Daughter in [place] - was a Runner, broadcasting/film/video - enjoyed working out at least 1 year ago  Additional Social History:                         Sleep: Good  Appetite:  Poor  Current Medications:  Current Facility-Administered Medications  Medication Dose Route Frequency Provider Last Rate Last Admin   acetaminophen (TYLENOL) tablet 650 mg  650 mg Oral Q6H PRN Sunday Corn, NP       alum & mag hydroxide-simeth (MAALOX/MYLANTA) 200-200-20 MG/5ML suspension 30 mL  30 mL Oral Q4H PRN Sunday Corn, NP       amLODipine (NORVASC) tablet 10 mg  10 mg Oral Daily Sunday Corn, NP   10 mg at 05/25/23 1948   busPIRone (BUSPAR) tablet 5 mg  5 mg Oral BID Sunday Corn, NP   5 mg at 05/25/23 2108   chlordiazePOXIDE (LIBRIUM) capsule 25 mg  25 mg Oral Q6H PRN Sunday Corn, NP       chlordiazePOXIDE (LIBRIUM) capsule 25 mg  25 mg Oral QID Sunday Corn, NP   25 mg at 05/25/23 2108   Followed by   Melene Muller ON 05/27/2023] chlordiazePOXIDE (LIBRIUM) capsule 25 mg  25 mg Oral TID Sunday Corn, NP       Followed by   Melene Muller ON 05/28/2023] chlordiazePOXIDE (LIBRIUM) capsule 25 mg  25 mg Oral BH-qamhs Sunday Corn, NP       Followed by   [  START ON 05/29/2023] chlordiazePOXIDE (LIBRIUM) capsule 25 mg  25 mg Oral Daily Sunday Corn, NP       losartan (COZAAR) tablet 100 mg  100 mg Oral Daily Nelly Rout, MD       And   hydrochlorothiazide (HYDRODIURIL) tablet 12.5 mg  12.5 mg Oral Daily Nelly Rout, MD       hydrOXYzine (ATARAX) tablet 25 mg  25 mg Oral Q6H PRN Sunday Corn, NP       loperamide (IMODIUM) capsule 2-4 mg  2-4 mg Oral PRN Sunday Corn, NP   2 mg at 05/25/23 1951   magnesium hydroxide (MILK OF MAGNESIA) suspension 30 mL  30 mL Oral Daily PRN Sunday Corn, NP       mirtazapine (REMERON) tablet  7.5 mg  7.5 mg Oral QHS Sunday Corn, NP   7.5 mg at 05/25/23 2108   multivitamin with minerals tablet 1 tablet  1 tablet Oral Daily Sunday Corn, NP   1 tablet at 05/25/23 1826   ondansetron (ZOFRAN-ODT) disintegrating tablet 4 mg  4 mg Oral Q6H PRN Sunday Corn, NP   4 mg at 05/25/23 1826   traZODone (DESYREL) tablet 100 mg  100 mg Oral QHS PRN Sunday Corn, NP   100 mg at 05/25/23 2131   Current Outpatient Medications  Medication Sig Dispense Refill   amLODipine (NORVASC) 10 MG tablet Take 1 tablet (10 mg total) by mouth daily. 30 tablet 0   amLODipine (NORVASC) 10 MG tablet Take 1 tablet (10 mg total) by mouth daily for 7 days. 7 tablet 0   busPIRone (BUSPAR) 15 MG tablet Take 1 tablet (15 mg total) by mouth 2 (two) times daily. 60 tablet 1   busPIRone (BUSPAR) 15 MG tablet Take 1 tablet (15 mg total) by mouth 2 (two) times daily for 7 days. 14 tablet 0   Cyanocobalamin (VITAMIN B-12 PO) Take 1 tablet by mouth daily.     hydrOXYzine (ATARAX) 25 MG tablet Take 1 tablet (25 mg total) by mouth 2 (two) times daily as needed for anxiety. 30 tablet 0   hydrOXYzine (ATARAX) 25 MG tablet Take 1 tablet (25 mg total) by mouth 2 (two) times daily as needed for up to 14 days. 12 tablet 0   hydrOXYzine (VISTARIL) 25 MG capsule Take 1 capsule (25 mg total) by mouth 3 (three) times daily as needed. 30 capsule 0   losartan-hydrochlorothiazide (HYZAAR) 100-12.5 MG tablet Take 1 tablet by mouth daily.     losartan-hydrochlorothiazide (HYZAAR) 100-12.5 MG tablet Take 1 tablet by mouth daily for 7 days. 7 tablet 0   mirtazapine (REMERON) 7.5 MG tablet Take 1 tablet (7.5 mg total) by mouth at bedtime. 30 tablet 0   mirtazapine (REMERON) 7.5 MG tablet Take 1 tablet (7.5 mg total) by mouth at bedtime for 7 days. 7 tablet 0   naltrexone (DEPADE) 50 MG tablet Take 50 mg by mouth daily.     naltrexone (DEPADE) 50 MG tablet Take 1 tablet (50 mg total) by mouth daily for 7 days. 7 tablet 0    traZODone (DESYREL) 100 MG tablet Take 1 tablet (100 mg total) by mouth at bedtime as needed for sleep. Take 100mg - 150mg  by mouth at bedtime as needed. 30 tablet 1   traZODone (DESYREL) 100 MG tablet Take 1 tablet (100 mg total) by mouth at bedtime for 7 days. 7 tablet 0    Labs  Lab Results:  Admission on 05/25/2023  Component Date Value Ref Range Status   Hgb A1c MFr Bld 05/25/2023 4.9  4.8 - 5.6 % Final   Comment: (NOTE) Pre diabetes:          5.7%-6.4%  Diabetes:              >6.4%  Glycemic control for   <7.0% adults with diabetes    Mean Plasma Glucose 05/25/2023 93.93  mg/dL Final   Performed at [facility], [address], [place], [place] [address]   Magnesium 05/25/2023 1.8  1.7 - 2.4 mg/dL Final   Performed at [facility], [address], [place], [place] [address]   Alcohol, Ethyl (B) 05/25/2023 <10  <10 mg/dL Final   Comment: (NOTE) Lowest detectable limit for serum alcohol is 10 mg/dL.  For medical purposes only. Performed at [facility], [address] [place], [place] [address]    Cholesterol 05/25/2023 145  0 - 200 mg/dL Final   Triglycerides 91/47/8295 65  <150 mg/dL Final   HDL 62/13/0865 91  >40 mg/dL Final   Total CHOL/HDL Ratio 05/25/2023 1.6  RATIO Final   VLDL 05/25/2023 13  0 - 40 mg/dL Final   LDL Cholesterol 05/25/2023 41  0 - 99 mg/dL Final   Comment:        Total Cholesterol/HDL:CHD Risk Coronary Heart Disease Risk Table                     Men   Women  1/2 Average Risk   3.4   3.3  Average Risk       5.0   4.4  2 X Average Risk   9.6   7.1  3 X Average Risk  23.4   11.0        Use the calculated Patient Ratio above and the CHD Risk Table to determine the patient's CHD Risk.        ATP III CLASSIFICATION (LDL):  <100     mg/dL   Optimal  784-696  mg/dL   Near or Above                    Optimal  130-159  mg/dL   Borderline  295-284  mg/dL   High  >132     mg/dL   Very High Performed at [facility],  [address], [place], [place] [address]    TSH 05/25/2023 1.830  0.350 - 4.500 uIU/mL Final   Comment: Performed by a 3rd Generation assay with a functional sensitivity of <=0.01 uIU/mL. Performed at [facility], [address], [place], [place] [address]    POC Amphetamine UR 05/26/2023 None Detected  NONE DETECTED (Cut Off Level 1000 ng/mL) Final   POC Secobarbital (BAR) 05/26/2023 None Detected  NONE DETECTED (Cut Off Level 300 ng/mL) Final   POC Buprenorphine (BUP) 05/26/2023 None Detected  NONE DETECTED (Cut Off Level 10 ng/mL) Final   POC Oxazepam (BZO) 05/26/2023 Positive (A)  NONE DETECTED (Cut Off Level 300 ng/mL) Final   POC Cocaine UR 05/26/2023 None Detected  NONE DETECTED (Cut Off Level 300 ng/mL) Final   POC Methamphetamine UR 05/26/2023 None Detected  NONE DETECTED (Cut Off Level 1000 ng/mL) Final   POC Morphine 05/26/2023 None Detected  NONE DETECTED (Cut Off Level 300 ng/mL) Final   POC Methadone UR 05/26/2023 None Detected  NONE DETECTED (Cut Off Level 300 ng/mL) Final   POC Oxycodone UR  05/26/2023 Positive (A)  NONE DETECTED (Cut Off Level 100 ng/mL) Final   POC Marijuana UR 05/26/2023 None Detected  NONE DETECTED (Cut Off Level 50 ng/mL) Final  Admission on 05/25/2023, Discharged on 05/25/2023  Component Date Value Ref Range Status   WBC 05/25/2023 6.0  4.0 - 10.5 K/uL Final   RBC 05/25/2023 4.80  3.87 - 5.11 MIL/uL Final   Hemoglobin 05/25/2023 14.5  12.0 - 15.0 g/dL Final   HCT 16/09/9603 43.6  36.0 - 46.0 % Final   MCV 05/25/2023 90.8  80.0 - 100.0 fL Final   MCH 05/25/2023 30.2  26.0 - 34.0 pg Final   MCHC 05/25/2023 33.3  30.0 - 36.0 g/dL Final   RDW 54/08/8118 14.9  11.5 - 15.5 % Final   Platelets 05/25/2023 241  150 - 400 K/uL Final   nRBC 05/25/2023 0.0  0.0 - 0.2 % Final   Neutrophils Relative % 05/25/2023 76  % Final   Neutro Abs 05/25/2023 4.5  1.7 - 7.7 K/uL Final   Lymphocytes Relative 05/25/2023 16  % Final   Lymphs Abs 05/25/2023 1.0  0.7 - 4.0  K/uL Final   Monocytes Relative 05/25/2023 6  % Final   Monocytes Absolute 05/25/2023 0.3  0.1 - 1.0 K/uL Final   Eosinophils Relative 05/25/2023 0  % Final   Eosinophils Absolute 05/25/2023 0.0  0.0 - 0.5 K/uL Final   Basophils Relative 05/25/2023 1  % Final   Basophils Absolute 05/25/2023 0.1  0.0 - 0.1 K/uL Final   Immature Granulocytes 05/25/2023 1  % Final   Abs Immature Granulocytes 05/25/2023 0.05  0.00 - 0.07 K/uL Final   Performed at [facility], [address], [place], [place] [address]   Sodium 05/25/2023 138  135 - 145 mmol/L Final   Potassium 05/25/2023 3.7  3.5 - 5.1 mmol/L Final   Chloride 05/25/2023 96 (L)  98 - 111 mmol/L Final   CO2 05/25/2023 23  22 - 32 mmol/L Final   Glucose, Bld 05/25/2023 121 (H)  70 - 99 mg/dL Final   Glucose reference range applies only to samples taken after fasting for at least 8 hours.   BUN 05/25/2023 21  8 - 23 mg/dL Final   Creatinine, Ser 05/25/2023 0.63  0.44 - 1.00 mg/dL Final   Calcium 95/62/1308 8.9  8.9 - 10.3 mg/dL Final   Total Protein 65/78/4696 6.9  6.5 - 8.1 g/dL Final   Albumin 29/52/8413 4.4  3.5 - 5.0 g/dL Final   AST 24/40/1027 105 (H)  15 - 41 U/L Final   ALT 05/25/2023 62 (H)  0 - 44 U/L Final   Alkaline Phosphatase 05/25/2023 87  38 - 126 U/L Final   Total Bilirubin 05/25/2023 2.0 (H)  0.3 - 1.2 mg/dL Final   GFR, Estimated 05/25/2023 >60  >60 mL/min Final   Comment: (NOTE) Calculated using the CKD-EPI Creatinine Equation (2021)    Anion gap 05/25/2023 19 (H)  5 - 15 Final   Performed at [facility], [address], [place], [place] [address]   Lipase 05/25/2023 52 (H)  11 - 51 U/L Final   Performed at [facility], [address], [place], [place] [address]   Color, Urine 05/25/2023 YELLOW  YELLOW Final   APPearance 05/25/2023 CLEAR  CLEAR Final   Specific Gravity, Urine 05/25/2023 1.025  1.005 - 1.030 Final   pH 05/25/2023 5.0  5.0 - 8.0 Final   Glucose, UA 05/25/2023 NEGATIVE  NEGATIVE mg/dL  Final   Hgb urine dipstick 05/25/2023 NEGATIVE  NEGATIVE Final   Bilirubin Urine 05/25/2023 NEGATIVE  NEGATIVE Final   Ketones, ur 05/25/2023 NEGATIVE  NEGATIVE mg/dL Final   Protein, ur 40/98/1191 NEGATIVE  NEGATIVE mg/dL Final   Nitrite 47/82/9562 NEGATIVE  NEGATIVE Final   Leukocytes,Ua 05/25/2023 NEGATIVE  NEGATIVE Final   Performed at [facility], [address], [place], [place] [address]   Troponin I (High Sensitivity) 05/25/2023 13  <18 ng/L Final   Comment: (NOTE) Elevated high sensitivity troponin I (hsTnI) values and significant  changes across serial measurements may suggest ACS but many other  chronic and acute conditions are known to elevate hsTnI results.  Refer to the "Links" section for chest pain algorithms and additional  guidance. Performed at [facility], [address], [place], [place] [address]    pH, Ven 05/25/2023 7.406  7.25 - 7.43 Final   pCO2, Ven 05/25/2023 39.8 (L)  44 - 60 mmHg Final   pO2, Ven 05/25/2023 64 (H)  32 - 45 mmHg Final   Bicarbonate 05/25/2023 25.0  20.0 - 28.0 mmol/L Final   TCO2 05/25/2023 26  22 - 32 mmol/L Final   O2 Saturation 05/25/2023 92  % Final   Acid-Base Excess 05/25/2023 0.0  0.0 - 2.0 mmol/L Final   Sodium 05/25/2023 134 (L)  135 - 145 mmol/L Final   Potassium 05/25/2023 3.7  3.5 - 5.1 mmol/L Final   Calcium, Ion 05/25/2023 1.01 (L)  1.15 - 1.40 mmol/L Final   HCT 05/25/2023 44.0  36.0 - 46.0 % Final   Hemoglobin 05/25/2023 15.0  12.0 - 15.0 g/dL Final   Sample type 96/29/5284 VENOUS   Final   Troponin I (High Sensitivity) 05/25/2023 11  <18 ng/L Final   Comment: (NOTE) Elevated high sensitivity troponin I (hsTnI) values and significant  changes across serial measurements may suggest ACS but many other  chronic and acute conditions are known to elevate hsTnI results.  Refer to the "Links" section for chest pain algorithms and additional  guidance. Performed at [facility], [address]  [place], [place] [address]   Admission on 03/19/2023, Discharged on 03/26/2023  Component Date Value Ref Range Status   SARS Coronavirus 2 by RT PCR 03/19/2023 NEGATIVE  NEGATIVE Final   Performed at [facility], [address] [place], [place] [address]   WBC 03/19/2023 9.3  4.0 - 10.5 K/uL Final   RBC 03/19/2023 3.87  3.87 - 5.11 MIL/uL Final   Hemoglobin 03/19/2023 11.8 (L)  12.0 - 15.0 g/dL Final   HCT 25/36/6440 35.3 (L)  36.0 - 46.0 % Final   MCV 03/19/2023 91.2  80.0 - 100.0 fL Final   MCH 03/19/2023 30.5  26.0 - 34.0 pg Final   MCHC 03/19/2023 33.4  30.0 - 36.0 g/dL Final   RDW 34/74/2595 15.4  11.5 - 15.5 % Final   Platelets 03/19/2023 181  150 - 400 K/uL Final   nRBC 03/19/2023 0.0  0.0 - 0.2 % Final   Neutrophils Relative % 03/19/2023 73  % Final   Neutro Abs 03/19/2023 6.8  1.7 - 7.7 K/uL Final   Lymphocytes Relative 03/19/2023 15  % Final   Lymphs Abs 03/19/2023 1.4  0.7 - 4.0 K/uL Final   Monocytes Relative 03/19/2023 10  % Final   Monocytes Absolute 03/19/2023 0.9  0.1 - 1.0 K/uL Final   Eosinophils Relative 03/19/2023 0  % Final  Eosinophils Absolute 03/19/2023 0.0  0.0 - 0.5 K/uL Final   Basophils Relative 03/19/2023 1  % Final   Basophils Absolute 03/19/2023 0.1  0.0 - 0.1 K/uL Final   Immature Granulocytes 03/19/2023 1  % Final   Abs Immature Granulocytes 03/19/2023 0.06  0.00 - 0.07 K/uL Final   Performed at [facility], [address] [place], [place] [address]   Sodium 03/19/2023 132 (L)  135 - 145 mmol/L Final   Potassium 03/19/2023 2.8 (L)  3.5 - 5.1 mmol/L Final   Chloride 03/19/2023 95 (L)  98 - 111 mmol/L Final   CO2 03/19/2023 24  22 - 32 mmol/L Final   Glucose, Bld 03/19/2023 97  70 - 99 mg/dL Final   Glucose reference range applies only to samples taken after fasting for at least 8 hours.   BUN 03/19/2023 21  8 - 23 mg/dL Final   Creatinine, Ser 03/19/2023 0.95  0.44 - 1.00 mg/dL Final   Calcium 60/45/4098 9.0  8.9 - 10.3 mg/dL Final    Total Protein 03/19/2023 7.1  6.5 - 8.1 g/dL Final   Albumin 11/91/4782 4.5  3.5 - 5.0 g/dL Final   AST 95/62/1308 37  15 - 41 U/L Final   ALT 03/19/2023 29  0 - 44 U/L Final   Alkaline Phosphatase 03/19/2023 81  38 - 126 U/L Final   Total Bilirubin 03/19/2023 2.9 (H)  0.3 - 1.2 mg/dL Final   GFR, Estimated 03/19/2023 >60  >60 mL/min Final   Comment: (NOTE) Calculated using the CKD-EPI Creatinine Equation (2021)    Anion gap 03/19/2023 13  5 - 15 Final   Performed at [facility], [address], [place], [place] [address]   Alcohol, Ethyl (B) 03/19/2023 <10  <10 mg/dL Final   Comment: (NOTE) Lowest detectable limit for serum alcohol is 10 mg/dL.  For medical purposes only. Performed at [facility], [address] [place], [place] [address]    POC Amphetamine UR 03/21/2023 None Detected  NONE DETECTED (Cut Off Level 1000 ng/mL) Final   POC Secobarbital (BAR) 03/21/2023 None Detected  NONE DETECTED (Cut Off Level 300 ng/mL) Final   POC Buprenorphine (BUP) 03/21/2023 None Detected  NONE DETECTED (Cut Off Level 10 ng/mL) Final   POC Oxazepam (BZO) 03/21/2023 Positive (A)  NONE DETECTED (Cut Off Level 300 ng/mL) Final   POC Cocaine UR 03/21/2023 None Detected  NONE DETECTED (Cut Off Level 300 ng/mL) Final   POC Methamphetamine UR 03/21/2023 None Detected  NONE DETECTED (Cut Off Level 1000 ng/mL) Final   POC Morphine 03/21/2023 None Detected  NONE DETECTED (Cut Off Level 300 ng/mL) Final   POC Methadone UR 03/21/2023 None Detected  NONE DETECTED (Cut Off Level 300 ng/mL) Final   POC Oxycodone UR 03/21/2023 None Detected  NONE DETECTED (Cut Off Level 100 ng/mL) Final   POC Marijuana UR 03/21/2023 None Detected  NONE DETECTED (Cut Off Level 50 ng/mL) Final   Color, Urine 03/19/2023 AMBER (A)  YELLOW Final   BIOCHEMICALS MAY BE AFFECTED BY COLOR   APPearance 03/19/2023 CLOUDY (A)  CLEAR Final   Specific Gravity, Urine 03/19/2023 1.025  1.005 - 1.030 Final   pH 03/19/2023 5.0   5.0 - 8.0 Final   Glucose, UA 03/19/2023 NEGATIVE  NEGATIVE mg/dL Final   Hgb urine dipstick 03/19/2023 NEGATIVE  NEGATIVE Final   Bilirubin Urine 03/19/2023 MODERATE (A)  NEGATIVE Final   Ketones, ur 03/19/2023 5 (A)  NEGATIVE mg/dL Final  Protein, ur 03/19/2023 100 (A)  NEGATIVE mg/dL Final   Nitrite 29/56/2130 NEGATIVE  NEGATIVE Final   Leukocytes,Ua 03/19/2023 MODERATE (A)  NEGATIVE Final   RBC / HPF 03/19/2023 0-5  0 - 5 RBC/hpf Final   WBC, UA 03/19/2023 21-50  0 - 5 WBC/hpf Final   Bacteria, UA 03/19/2023 MANY (A)  NONE SEEN Final   Squamous Epithelial / HPF 03/19/2023 6-10  0 - 5 /HPF Final   Mucus 03/19/2023 PRESENT   Final   Hyaline Casts, UA 03/19/2023 PRESENT   Final   Non Squamous Epithelial 03/19/2023 0-5 (A)  NONE SEEN Final   Performed at [facility], [address], [place], [place] [address]   Magnesium 03/19/2023 2.2  1.7 - 2.4 mg/dL Final   Performed at [facility], [address] [place], [place] [address]   Vitamin B-12 03/19/2023 398  180 - 914 pg/mL Final   Comment: (NOTE) This assay is not validated for testing neonatal or myeloproliferative syndrome specimens for Vitamin B12 levels. Performed at [facility], [address] [place], [place] [address]    SARSCOV2ONAVIRUS 2 AG 03/19/2023 NEGATIVE  NEGATIVE Final   Comment: (NOTE) SARS-CoV-2 antigen NOT DETECTED.   Negative results are presumptive.  Negative results do not preclude SARS-CoV-2 infection and should not be used as the sole basis for treatment or other patient management decisions, including infection  control decisions, particularly in the presence of clinical signs and  symptoms consistent with COVID-19, or in those who have been in contact with the virus.  Negative results must be combined with clinical observations, patient history, and epidemiological information. The expected result is Negative.  Fact Sheet for Patients:  https://www.jennings-kim.com/  Fact Sheet for Healthcare Providers: https://alexander-rogers.biz/  This test is not yet approved or cleared by the [place] FDA and  has been authorized for detection and/or diagnosis of SARS-CoV-2 by FDA under an Emergency Use Authorization (EUA).  This EUA will remain in effect (meaning this test can be used) for the duration of  the COV                          ID-19 declaration under Section 564(b)(1) of the Act, 21 U.S.C. section 360bbb-3(b)(1), unless the authorization is terminated or revoked sooner.     Sodium 03/22/2023 135  135 - 145 mmol/L Final   Potassium 03/22/2023 3.6  3.5 - 5.1 mmol/L Final   Chloride 03/22/2023 99  98 - 111 mmol/L Final   CO2 03/22/2023 27  22 - 32 mmol/L Final   Glucose, Bld 03/22/2023 97  70 - 99 mg/dL Final   Glucose reference range applies only to samples taken after fasting for at least 8 hours.   BUN 03/22/2023 22  8 - 23 mg/dL Final   Creatinine, Ser 03/22/2023 0.72  0.44 - 1.00 mg/dL Final   Calcium 13/24/4010 9.4  8.9 - 10.3 mg/dL Final   Total Protein 27/25/3664 6.9  6.5 - 8.1 g/dL Final   Albumin 40/34/7425 3.8  3.5 - 5.0 g/dL Final   AST 95/63/8756 16  15 - 41 U/L Final   ALT 03/22/2023 18  0 - 44 U/L Final   Alkaline Phosphatase 03/22/2023 63  38 - 126 U/L Final   Total Bilirubin 03/22/2023 0.7  0.3 - 1.2 mg/dL Final   GFR, Estimated 03/22/2023 >60  >60 mL/min Final   Comment: (NOTE) Calculated using the CKD-EPI Creatinine Equation (2021)  Anion gap 03/22/2023 9  5 - 15 Final   Performed at [facility], [address] [place], [place] [address]  Admission on 02/11/2023, Discharged on 02/17/2023  Component Date Value Ref Range Status   Alcohol, Ethyl (B) 02/11/2023 72 (H)  <10 mg/dL Final   Comment: (NOTE) Lowest detectable limit for serum alcohol is 10 mg/dL.  For medical purposes only. Performed at [facility], [address] [place],  [place] [address]    Sodium 02/11/2023 136  135 - 145 mmol/L Final   Potassium 02/11/2023 3.2 (L)  3.5 - 5.1 mmol/L Final   Chloride 02/11/2023 97 (L)  98 - 111 mmol/L Final   CO2 02/11/2023 24  22 - 32 mmol/L Final   Glucose, Bld 02/11/2023 97  70 - 99 mg/dL Final   Glucose reference range applies only to samples taken after fasting for at least 8 hours.   BUN 02/11/2023 7 (L)  8 - 23 mg/dL Final   Creatinine, Ser 02/11/2023 0.47  0.44 - 1.00 mg/dL Final   Calcium 09/81/1914 9.2  8.9 - 10.3 mg/dL Final   Total Protein 78/29/5621 7.7  6.5 - 8.1 g/dL Final   Albumin 30/86/5784 4.7  3.5 - 5.0 g/dL Final   AST 69/62/9528 35  15 - 41 U/L Final   ALT 02/11/2023 34  0 - 44 U/L Final   Alkaline Phosphatase 02/11/2023 91  38 - 126 U/L Final   Total Bilirubin 02/11/2023 1.7 (H)  0.3 - 1.2 mg/dL Final   GFR, Estimated 02/11/2023 >60  >60 mL/min Final   Comment: (NOTE) Calculated using the CKD-EPI Creatinine Equation (2021)    Anion gap 02/11/2023 15  5 - 15 Final   Performed at [facility], [address] [place], [place] [address]   WBC 02/11/2023 9.2  4.0 - 10.5 K/uL Final   RBC 02/11/2023 4.23  3.87 - 5.11 MIL/uL Final   Hemoglobin 02/11/2023 13.4  12.0 - 15.0 g/dL Final   HCT 40/09/2724 38.8  36.0 - 46.0 % Final   MCV 02/11/2023 91.7  80.0 - 100.0 fL Final   MCH 02/11/2023 31.7  26.0 - 34.0 pg Final   MCHC 02/11/2023 34.5  30.0 - 36.0 g/dL Final   RDW 36/64/4034 13.0  11.5 - 15.5 % Final   Platelets 02/11/2023 422 (H)  150 - 400 K/uL Final   nRBC 02/11/2023 0.0  0.0 - 0.2 % Final   Performed at [facility], [address], [place], [place] [address]   Magnesium 02/11/2023 2.0  1.7 - 2.4 mg/dL Final   Performed at [facility], [address], [place], [place] [address]   Phosphorus 02/11/2023 3.7  2.5 - 4.6 mg/dL Final   Performed at [facility], [address] [place], [place] [address]   Vitamin B-12 02/11/2023 252  180 - 914 pg/mL Final   Comment: (NOTE) This  assay is not validated for testing neonatal or myeloproliferative syndrome specimens for Vitamin B12 levels. Performed at [facility], [address] [place], [place] [address]    Folate 02/11/2023 6.3  >5.9 ng/mL Final   Performed at [facility], [address], [place], [place] [address]   TSH 02/11/2023 3.115  0.350 - 4.500 uIU/mL Final   Comment: Performed by a 3rd Generation assay with a functional sensitivity of <=0.01 uIU/mL. Performed at [facility], [address] [place], [place] [address]  SARS Coronavirus 2 by RT PCR 02/11/2023 NEGATIVE  NEGATIVE Final   Influenza A by PCR 02/11/2023 NEGATIVE  NEGATIVE Final   Influenza B by PCR 02/11/2023 NEGATIVE  NEGATIVE Final   Comment: (NOTE) The Xpert Xpress SARS-CoV-2/FLU/RSV plus assay is intended as an aid in the diagnosis of influenza from Nasopharyngeal swab specimens and should not be used as a sole basis for treatment. Nasal washings and aspirates are unacceptable for Xpert Xpress SARS-CoV-2/FLU/RSV testing.  Fact Sheet for Patients: BloggerCourse.com  Fact Sheet for Healthcare Providers: SeriousBroker.it  This test is not yet approved or cleared by the [place] FDA and has been authorized for detection and/or diagnosis of SARS-CoV-2 by FDA under an Emergency Use Authorization (EUA). This EUA will remain in effect (meaning this test can be used) for the duration of the COVID-19 declaration under Section 564(b)(1) of the Act, 21 U.S.C. section 360bbb-3(b)(1), unless the authorization is terminated or revoked.     Resp Syncytial Virus by PCR 02/11/2023 NEGATIVE  NEGATIVE Final   Comment: (NOTE) Fact Sheet for Patients: BloggerCourse.com  Fact Sheet for Healthcare Providers: SeriousBroker.it  This test is not yet approved or cleared by the [place] FDA and has been authorized for  detection and/or diagnosis of SARS-CoV-2 by FDA under an Emergency Use Authorization (EUA). This EUA will remain in effect (meaning this test can be used) for the duration of the COVID-19 declaration under Section 564(b)(1) of the Act, 21 U.S.C. section 360bbb-3(b)(1), unless the authorization is terminated or revoked.  Performed at [facility], [address] [place], [place] [address]    Potassium 02/14/2023 4.2  3.5 - 5.1 mmol/L Final   Performed at [facility], [address] [place], [place] [address]    Blood Alcohol level:  Lab Results  Component Value Date   ETH <10 05/25/2023   ETH <10 03/19/2023    Metabolic Disorder Labs: Lab Results  Component Value Date   HGBA1C 4.9 05/25/2023   MPG 93.93 05/25/2023   No results found for: "PROLACTIN" Lab Results  Component Value Date   CHOL 145 05/25/2023   TRIG 65 05/25/2023   HDL 91 05/25/2023   CHOLHDL 1.6 05/25/2023   VLDL 13 05/25/2023   LDLCALC 41 05/25/2023    Therapeutic Lab Levels: No results found for: "LITHIUM" No results found for: "VALPROATE" No results found for: "CBMZ"  Physical Findings   AUDIT    Flowsheet Row ED from 05/25/2023 in [facility] Emergency Department at [facility]  Alcohol Use Disorder Identification Test Final Score (AUDIT) 27      PHQ2-9    Flowsheet Row ED from 05/25/2023 in [facility] ED from 03/19/2023 in [facility] ED from 02/11/2023 in [place]  PHQ-2 Total Score 6 3 1   PHQ-9 Total Score 13 3 4       Flowsheet Row ED from 05/25/2023 in [facility] Most recent reading at 05/25/2023  5:34 PM ED from 05/25/2023 in [facility] Most recent reading at 05/25/2023  4:27 PM ED from 05/25/2023 in [facility] Emergency Department at [facility] Most recent reading at 05/25/2023 11:00 AM  C-SSRS RISK CATEGORY No  Risk No Risk No Risk        Musculoskeletal  Strength & Muscle Tone: decreased Gait & Station: normal, but slowed Patient leans: N/A  Psychiatric Specialty Exam  Presentation  General Appearance:  Appropriate for  Environment  Eye Contact: Good  Speech: Clear and Coherent; Normal Rate  Speech Volume: Normal  Handedness: Right   Mood and Affect  Mood: Euthymic  Affect: Congruent   Thought Process  Thought Processes: Coherent; Goal Directed  Descriptions of Associations:Intact  Orientation:Full (Time, Place and Person)  Thought Content:Logical  Diagnosis of Schizophrenia or Schizoaffective disorder in past: No    Hallucinations:Hallucinations: None  Ideas of Reference:None  Suicidal Thoughts:Suicidal Thoughts: No  Homicidal Thoughts:Homicidal Thoughts: No   Sensorium  Memory: Immediate Fair; Recent Fair; Remote Fair  Judgment: Fair  Insight: Fair   Art therapist  Concentration: Fair  Attention Span: Fair  Recall: [employer] of Knowledge: Fair  Language: Fair   Psychomotor Activity  Psychomotor Activity: Psychomotor Activity: Normal   Assets  Assets: Communication Skills; Desire for Improvement; Financial Resources/Insurance; Housing; Physical Health; Resilience   Sleep  Sleep: Sleep: Good Number of Hours of Sleep: 10   Nutritional Assessment (For OBS and [facility]BC admissions only) Has the patient had a weight loss or gain of 10 pounds or more in the last 3 months?: No Has the patient had a decrease in food intake/or appetite?: Yes Does the patient have dental problems?: No Does the patient have eating habits or behaviors that may be indicators of an eating disorder including binging or inducing vomiting?: No Has the patient recently lost weight without trying?: 0 Has the patient been eating poorly because of a decreased appetite?: 1 Malnutrition Screening Tool Score: 1    Physical Exam  Physical Exam Vitals  reviewed.  Constitutional:      General: She is not in acute distress.    Appearance: She is ill-appearing and diaphoretic.  HENT:     Head: Normocephalic and atraumatic.  Pulmonary:     Effort: Pulmonary effort is normal.  Skin:    General: Skin is warm.     Coloration: Skin is not jaundiced.  Neurological:     Mental Status: She is alert and oriented to person, place, and time.     Motor: Weakness present.     Gait: Gait abnormal.    Review of Systems  Gastrointestinal:  Positive for diarrhea and nausea.  Neurological:  Positive for dizziness. Negative for tremors, seizures, weakness and headaches.   Blood pressure (!) 160/82, pulse 99, temperature 98.5 F (36.9 C), temperature source Oral, resp. rate 18, SpO2 96 %. There is no height or weight on file to calculate BMI.  Treatment Plan Summary: Daily contact with patient to assess and evaluate symptoms and progress in treatment and Medication management  LONEY BALRAM is a 72 y.o. female patient who presented voluntarily [facility] seeking alcohol detox, for which she was admitted to [facility]. Patient has been admitted to [facility] multiple times, with most recent admission, set up with IOP at programs in [place], [place], [place], [place], and [place], [place]. Patient has not followed through on any treatment options and is limited in available options due to [employer]. Attempt for CDIOP to be arranged by outpatient psychiatrist McQuilla MD but patient did not follow up. LCSW to assist with dispo planning for patient.   #Alcohol Use Disorder CIWA Librium protocol initiated: - chlordiazepoxide (Librium) 25 mg 4 times daily x4 doses, 25 mg 3 times daily x3 doses,25 mg 2 times daily x2 doses, 25 mg daily x1 dose -chlordiazepoxide (LIbrium) 25 mg every 6 hours as needed for CIWA greater than 10; Hydroxyzine 25 mg for CIWA less than 10 -Multivitamin with minerals 1 tablet daily -  Ondansetron disintegrating tablet 4 mg every 6 as needed/nausea or  vomiting -Loperamide 2 to 4 mg oral as needed/diarrhea or loose stools -Thiamine injection 100 mg IM once followed by PO daily -- Hold home Naltrexone while detoxing; to be resumed   #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

## 2023-05-26 NOTE — Progress Notes (Signed)
Pt is asleep. Respirations are even and unlabored. No signs of acute distress noted. Staff will monitor for pt's safety. 

## 2023-05-26 NOTE — ED Notes (Signed)
Pt was given lunch

## 2023-05-26 NOTE — Group Note (Signed)
Group Topic: Decisional Balance/Substance Abuse  Group Date: 05/26/2023 Start Time: 1000 End Time: 1100 Facilitators: Donnie Coffin, NT  Department: [facility]  Number of Participants: 2  Group Focus: abuse issues, chemical dependency issues, and co-dependency Treatment Modality:  Skills Training Interventions utilized were group exercise Purpose: enhance coping skills and relapse prevention strategies  Name: Stephanie Riley Date of Birth: 1951/08/19  MR: [id number]    Level of Participation: pt refused to attend group this morning, stated she doesn't feel well Quality of Participation: pt refused Interactions with others: pt refused Mood/Affect: pt refused Triggers (if applicable): n/a Cognition: pt refused Progress: None Response: pt refused Plan: patient will be encouraged to attend groups  Patients Problems:  Patient Active Problem List   Diagnosis Date Noted   Alcohol use disorder 03/19/2023   Alcohol use disorder, moderate, dependence (HCC) 02/12/2023   Benzodiazepine withdrawal without complication (HCC) 02/12/2023

## 2023-05-26 NOTE — ED Notes (Signed)
Pt is in his room resting in bed. Pt denies SI/HI/AVH. No acute distress noted. Will continue to monitor for safety. 

## 2023-05-26 NOTE — Tx Team (Signed)
Patient is known to this provider. LCSW, MD, and Resident met with patient to assess current mood, affect, physical state, and inquire about needs/goals while here in [facility] and after discharge. Patient reports she returned back to drinking after a couple weeks of being sober. Patient reports missing her virtual MD appts, however denies this being related to alcohol use. Patient is requesting to go to [facility] residential program, however understand that they do not accept Medicare and she would have to pay $26,000.00 for admission. Patient reports her husband would pay for it, however MD had conversation with patient regarding this barrier during last admission. The patient expressed some ambivalence regarding facilities outside of [place]. Per MD, patient was accepted to multiple facilities during last admission, however refused to go to further treatment and decided to return back home. Patient reports being in agreement with LCSW faxing her clinicals out for review to the agencies that may accept her insurance.  Referral has been sent to the following facilities: [facility], [address], and [facility] in [place]. LCSW will follow up to provide updates as received.  Fernande Boyden, LCSW Clinical Social Worker [facility] Ph: [phone]

## 2023-05-26 NOTE — Progress Notes (Signed)
[name] CIWA was 0. 

## 2023-05-26 NOTE — Progress Notes (Signed)
Pt is awake, alert and oriented X4. Pt did not voice any complaints of pain or discomfort. No signs of acute distress noted. Administered scheduled meds with no issue. Pt denies SI/HI/AVH, plan or intent. Staff will monitor for pt's safety.

## 2023-05-26 NOTE — Group Note (Signed)
Group Topic: Positive Affirmations  Group Date: 05/26/2023 Start Time: 0810 End Time: 0830 Facilitators: Emmit Pomfret D, NT  Department: [facility]  Number of Participants: 2  Group Focus: affirmation Treatment Modality:  Psychoeducation Interventions utilized were story telling Purpose: reinforce self-care  Name: Stephanie Riley Date of Birth: 08-04-1951  MR: [id number]    Level of Participation: moderate Quality of Participation: motivated Interactions with others: gave feedback Mood/Affect: appropriate Triggers (if applicable): n/a Cognition: concrete Progress: Significant Response: n/a Plan: follow-up needed  Patients Problems:  Patient Active Problem List   Diagnosis Date Noted   Alcohol use disorder 03/19/2023   Alcohol use disorder, moderate, dependence (HCC) 02/12/2023   Benzodiazepine withdrawal without complication (HCC) 02/12/2023

## 2023-05-27 ENCOUNTER — Encounter (HOSPITAL_COMMUNITY): Payer: Self-pay

## 2023-05-27 DIAGNOSIS — F1090 Alcohol use, unspecified, uncomplicated: Secondary | ICD-10-CM | POA: Diagnosis not present

## 2023-05-27 NOTE — ED Notes (Signed)
Pt was given breakfast this morning 

## 2023-05-27 NOTE — ED Provider Notes (Signed)
Behavioral Health Progress Note  Date and Time: 05/27/2023 1:38 PM Name: Stephanie Riley MRN:  [id number]  Subjective:  Stephanie Riley is a 72 y.o. female patient with a past psychiatric history of alcohol use disorder, MDD, GAD, benzodiazepine withdrawal with delirium, substance-induced mood disorder, insomnia, paranoia, adjustment disorder, and ADHD who presented voluntarily and unaccompanied to [facility] seeking alcohol detox, for which she was admitted to [facility].  On assessment today, patient reports feeling "pretty good" but notes hesitance about going to the [facility] in [place]. She expresses her concerns, and she insists that she would like to go to [facility] or an IOP. She is reminded that her husband will not pay for the former, and her insurance did not authorize the latter. She is advised that [facility] is her only option due to her insurance limitation and lack of bed availability at [facility]. Motivational interviewing is provided and patient reports that she would like some time to reflect on her options and decision. She is encouraged to complete the phone screening with [facility] and to ask any questions she has, which can aid in her decision-making. She endorses good sleep and improving appetite. She denies SI, HI, and AVH.   Diagnosis:  Final diagnoses:  Alcohol use disorder    Total Time spent with patient: 30 minutes  Past Psychiatric History:  OPT: Hx of Adderall, Zoloft, Trazodone, Wellbutrin, Xanax, Klonopin, no psychiatry opt 12/2022- INPT at [facility] , patient noted to have yellow/orange sclera on arrival, but cleared by discharge. Dx with Etoh use d/o,. MOCA was done due to cognitive concerns, scored 23/30. 2nd hosp in 2024- [facility], BZD withdrawal Dx: MDD, Etoh use disorder, ADHD, anxiety, and chronic prescribed BZD use. Therapy: marriage counseling Past Medical History: HTN Family History: Both parents had Alzhmier's  Family Psychiatric   History: na Social History: -Living alone, separated from husband -Daughter in [place] - was a Runner, broadcasting/film/video - enjoyed working out at least 1 year ago  Additional Social History:                         Sleep: Good  Appetite:   Improving  Current Medications:  Current Facility-Administered Medications  Medication Dose Route Frequency Provider Last Rate Last Admin   acetaminophen (TYLENOL) tablet 650 mg  650 mg Oral Q6H PRN Sunday Corn, NP       alum & mag hydroxide-simeth (MAALOX/MYLANTA) 200-200-20 MG/5ML suspension 30 mL  30 mL Oral Q4H PRN Sunday Corn, NP       amLODipine (NORVASC) tablet 10 mg  10 mg Oral Daily Sunday Corn, NP   10 mg at 05/27/23 0918   busPIRone (BUSPAR) tablet 5 mg  5 mg Oral BID Sunday Corn, NP   5 mg at 05/27/23 4098   chlordiazePOXIDE (LIBRIUM) capsule 25 mg  25 mg Oral Q6H PRN Sunday Corn, NP       chlordiazePOXIDE (LIBRIUM) capsule 25 mg  25 mg Oral TID Sunday Corn, NP   25 mg at 05/27/23 1191   Followed by   Melene Muller ON 05/28/2023] chlordiazePOXIDE (LIBRIUM) capsule 25 mg  25 mg Oral BH-qamhs Sunday Corn, NP       Followed by   Melene Muller ON 05/29/2023] chlordiazePOXIDE (LIBRIUM) capsule 25 mg  25 mg Oral Daily Sunday Corn, NP       losartan (COZAAR) tablet 100 mg  100 mg Oral Daily Nelly Rout,  MD   100 mg at 05/27/23 4540   And   hydrochlorothiazide (HYDRODIURIL) tablet 12.5 mg  12.5 mg Oral Daily Nelly Rout, MD   12.5 mg at 05/27/23 9811   hydrOXYzine (ATARAX) tablet 25 mg  25 mg Oral Q6H PRN Sunday Corn, NP       loperamide (IMODIUM) capsule 2-4 mg  2-4 mg Oral PRN Sunday Corn, NP   4 mg at 05/27/23 9147   magnesium hydroxide (MILK OF MAGNESIA) suspension 30 mL  30 mL Oral Daily PRN Sunday Corn, NP       mirtazapine (REMERON) tablet 7.5 mg  7.5 mg Oral QHS Sunday Corn, NP   7.5 mg at 05/26/23 2111   multivitamin with minerals tablet 1 tablet  1 tablet Oral Daily Sunday Corn,  NP   1 tablet at 05/27/23 0918   ondansetron (ZOFRAN-ODT) disintegrating tablet 4 mg  4 mg Oral Q6H PRN Sunday Corn, NP   4 mg at 05/25/23 1826   traZODone (DESYREL) tablet 100 mg  100 mg Oral QHS PRN Sunday Corn, NP   100 mg at 05/26/23 2111   Current Outpatient Medications  Medication Sig Dispense Refill   amLODipine (NORVASC) 10 MG tablet Take 1 tablet (10 mg total) by mouth daily for 7 days. 7 tablet 0   busPIRone (BUSPAR) 15 MG tablet Take 1 tablet (15 mg total) by mouth 2 (two) times daily for 7 days. 14 tablet 0   Cyanocobalamin (VITAMIN B-12 PO) Take 1 tablet by mouth daily.     hydrOXYzine (ATARAX) 25 MG tablet Take 1 tablet (25 mg total) by mouth 2 (two) times daily as needed for up to 14 days. 12 tablet 0   loperamide (IMODIUM) 2 MG capsule Take 2 mg by mouth daily as needed for diarrhea or loose stools.     losartan-hydrochlorothiazide (HYZAAR) 100-12.5 MG tablet Take 1 tablet by mouth daily for 7 days. 7 tablet 0   mirtazapine (REMERON) 7.5 MG tablet Take 1 tablet (7.5 mg total) by mouth at bedtime for 7 days. 7 tablet 0   naltrexone (DEPADE) 50 MG tablet Take 1 tablet (50 mg total) by mouth daily for 7 days. 7 tablet 0   ondansetron (ZOFRAN) 4 MG tablet Take 4 mg by mouth every 8 (eight) hours as needed for nausea or vomiting.     sertraline (ZOLOFT) 100 MG tablet Take 100 mg by mouth daily.     traZODone (DESYREL) 100 MG tablet Take 1 tablet (100 mg total) by mouth at bedtime for 7 days. (Patient taking differently: Take 150 mg by mouth at bedtime.) 7 tablet 0    Labs  Lab Results:  Admission on 05/25/2023  Component Date Value Ref Range Status   Hgb A1c MFr Bld 05/25/2023 4.9  4.8 - 5.6 % Final   Comment: (NOTE) Pre diabetes:          5.7%-6.4%  Diabetes:              >6.4%  Glycemic control for   <7.0% adults with diabetes    Mean Plasma Glucose 05/25/2023 93.93  mg/dL Final   Performed at [facility], [address], [place], [place] [address]    Magnesium 05/25/2023 1.8  1.7 - 2.4 mg/dL Final   Performed at [facility] Lab, [address], [place], [place] [address]   Alcohol, Ethyl (B) 05/25/2023 <10  <10 mg/dL Final   Comment: (NOTE) Lowest detectable  limit for serum alcohol is 10 mg/dL.  For medical purposes only. Performed at [facility], [address] [place], [place] [address]    Cholesterol 05/25/2023 145  0 - 200 mg/dL Final   Triglycerides 60/45/4098 65  <150 mg/dL Final   HDL 11/91/4782 91  >40 mg/dL Final   Total CHOL/HDL Ratio 05/25/2023 1.6  RATIO Final   VLDL 05/25/2023 13  0 - 40 mg/dL Final   LDL Cholesterol 05/25/2023 41  0 - 99 mg/dL Final   Comment:        Total Cholesterol/HDL:CHD Risk Coronary Heart Disease Risk Table                     Men   Women  1/2 Average Risk   3.4   3.3  Average Risk       5.0   4.4  2 X Average Risk   9.6   7.1  3 X Average Risk  23.4   11.0        Use the calculated Patient Ratio above and the CHD Risk Table to determine the patient's CHD Risk.        ATP III CLASSIFICATION (LDL):  <100     mg/dL   Optimal  956-213  mg/dL   Near or Above                    Optimal  130-159  mg/dL   Borderline  086-578  mg/dL   High  >469     mg/dL   Very High Performed at [facility], [address], [place], [place] [address]    TSH 05/25/2023 1.830  0.350 - 4.500 uIU/mL Final   Comment: Performed by a 3rd Generation assay with a functional sensitivity of <=0.01 uIU/mL. Performed at [facility], [address], [place], [place] [address]    POC Amphetamine UR 05/26/2023 None Detected  NONE DETECTED (Cut Off Level 1000 ng/mL) Final   POC Secobarbital (BAR) 05/26/2023 None Detected  NONE DETECTED (Cut Off Level 300 ng/mL) Final   POC Buprenorphine (BUP) 05/26/2023 None Detected  NONE DETECTED (Cut Off Level 10 ng/mL) Final   POC Oxazepam (BZO) 05/26/2023 Positive (A)  NONE DETECTED (Cut Off Level 300 ng/mL) Final   POC Cocaine UR 05/26/2023 None Detected   NONE DETECTED (Cut Off Level 300 ng/mL) Final   POC Methamphetamine UR 05/26/2023 None Detected  NONE DETECTED (Cut Off Level 1000 ng/mL) Final   POC Morphine 05/26/2023 None Detected  NONE DETECTED (Cut Off Level 300 ng/mL) Final   POC Methadone UR 05/26/2023 None Detected  NONE DETECTED (Cut Off Level 300 ng/mL) Final   POC Oxycodone UR 05/26/2023 Positive (A)  NONE DETECTED (Cut Off Level 100 ng/mL) Final   POC Marijuana UR 05/26/2023 None Detected  NONE DETECTED (Cut Off Level 50 ng/mL) Final  Admission on 05/25/2023, Discharged on 05/25/2023  Component Date Value Ref Range Status   WBC 05/25/2023 6.0  4.0 - 10.5 K/uL Final   RBC 05/25/2023 4.80  3.87 - 5.11 MIL/uL Final   Hemoglobin 05/25/2023 14.5  12.0 - 15.0 g/dL Final   HCT 44/12/270 43.6  36.0 - 46.0 % Final   MCV 05/25/2023 90.8  80.0 - 100.0 fL Final   MCH 05/25/2023 30.2  26.0 - 34.0 pg Final   MCHC 05/25/2023 33.3  30.0 - 36.0 g/dL Final   RDW 53/66/4403 14.9  11.5 - 15.5 % Final  Platelets 05/25/2023 241  150 - 400 K/uL Final   nRBC 05/25/2023 0.0  0.0 - 0.2 % Final   Neutrophils Relative % 05/25/2023 76  % Final   Neutro Abs 05/25/2023 4.5  1.7 - 7.7 K/uL Final   Lymphocytes Relative 05/25/2023 16  % Final   Lymphs Abs 05/25/2023 1.0  0.7 - 4.0 K/uL Final   Monocytes Relative 05/25/2023 6  % Final   Monocytes Absolute 05/25/2023 0.3  0.1 - 1.0 K/uL Final   Eosinophils Relative 05/25/2023 0  % Final   Eosinophils Absolute 05/25/2023 0.0  0.0 - 0.5 K/uL Final   Basophils Relative 05/25/2023 1  % Final   Basophils Absolute 05/25/2023 0.1  0.0 - 0.1 K/uL Final   Immature Granulocytes 05/25/2023 1  % Final   Abs Immature Granulocytes 05/25/2023 0.05  0.00 - 0.07 K/uL Final   Performed at [facility], [address], [place], [place] [address]   Sodium 05/25/2023 138  135 - 145 mmol/L Final   Potassium 05/25/2023 3.7  3.5 - 5.1 mmol/L Final   Chloride 05/25/2023 96 (L)  98 - 111 mmol/L Final   CO2 05/25/2023 23   22 - 32 mmol/L Final   Glucose, Bld 05/25/2023 121 (H)  70 - 99 mg/dL Final   Glucose reference range applies only to samples taken after fasting for at least 8 hours.   BUN 05/25/2023 21  8 - 23 mg/dL Final   Creatinine, Ser 05/25/2023 0.63  0.44 - 1.00 mg/dL Final   Calcium 60/45/4098 8.9  8.9 - 10.3 mg/dL Final   Total Protein 11/91/4782 6.9  6.5 - 8.1 g/dL Final   Albumin 95/62/1308 4.4  3.5 - 5.0 g/dL Final   AST 65/78/4696 105 (H)  15 - 41 U/L Final   ALT 05/25/2023 62 (H)  0 - 44 U/L Final   Alkaline Phosphatase 05/25/2023 87  38 - 126 U/L Final   Total Bilirubin 05/25/2023 2.0 (H)  0.3 - 1.2 mg/dL Final   GFR, Estimated 05/25/2023 >60  >60 mL/min Final   Comment: (NOTE) Calculated using the CKD-EPI Creatinine Equation (2021)    Anion gap 05/25/2023 19 (H)  5 - 15 Final   Performed at [facility], [address] [place], [place] [address]   Lipase 05/25/2023 52 (H)  11 - 51 U/L Final   Performed at [facility], [address] [place], [place] [address]   Color, Urine 05/25/2023 YELLOW  YELLOW Final   APPearance 05/25/2023 CLEAR  CLEAR Final   Specific Gravity, Urine 05/25/2023 1.025  1.005 - 1.030 Final   pH 05/25/2023 5.0  5.0 - 8.0 Final   Glucose, UA 05/25/2023 NEGATIVE  NEGATIVE mg/dL Final   Hgb urine dipstick 05/25/2023 NEGATIVE  NEGATIVE Final   Bilirubin Urine 05/25/2023 NEGATIVE  NEGATIVE Final   Ketones, ur 05/25/2023 NEGATIVE  NEGATIVE mg/dL Final   Protein, ur 40/09/2724 NEGATIVE  NEGATIVE mg/dL Final   Nitrite 36/64/4034 NEGATIVE  NEGATIVE Final   Leukocytes,Ua 05/25/2023 NEGATIVE  NEGATIVE Final   Performed at [facility], [address] [place], [place] [address]   Troponin I (High Sensitivity) 05/25/2023 13  <18 ng/L Final   Comment: (NOTE) Elevated high sensitivity troponin I (hsTnI) values and significant  changes across serial measurements may suggest ACS but many other  chronic and acute conditions are known to elevate hsTnI  results.  Refer to the "Links" section for chest pain algorithms and additional  guidance. Performed at [facility], [address], [place], [place] [address]    pH, Ven 05/25/2023 7.406  7.25 - 7.43 Final   pCO2, Ven 05/25/2023 39.8 (L)  44 - 60 mmHg Final   pO2, Ven 05/25/2023 64 (H)  32 - 45 mmHg Final   Bicarbonate 05/25/2023 25.0  20.0 - 28.0 mmol/L Final   TCO2 05/25/2023 26  22 - 32 mmol/L Final   O2 Saturation 05/25/2023 92  % Final   Acid-Base Excess 05/25/2023 0.0  0.0 - 2.0 mmol/L Final   Sodium 05/25/2023 134 (L)  135 - 145 mmol/L Final   Potassium 05/25/2023 3.7  3.5 - 5.1 mmol/L Final   Calcium, Ion 05/25/2023 1.01 (L)  1.15 - 1.40 mmol/L Final   HCT 05/25/2023 44.0  36.0 - 46.0 % Final   Hemoglobin 05/25/2023 15.0  12.0 - 15.0 g/dL Final   Sample type 19/14/7829 VENOUS   Final   Troponin I (High Sensitivity) 05/25/2023 11  <18 ng/L Final   Comment: (NOTE) Elevated high sensitivity troponin I (hsTnI) values and significant  changes across serial measurements may suggest ACS but many other  chronic and acute conditions are known to elevate hsTnI results.  Refer to the "Links" section for chest pain algorithms and additional  guidance. Performed at [facility], [address], [place], [place] [address]   Admission on 03/19/2023, Discharged on 03/26/2023  Component Date Value Ref Range Status   SARS Coronavirus 2 by RT PCR 03/19/2023 NEGATIVE  NEGATIVE Final   Performed at [facility], [address], [place], [place] [address]   WBC 03/19/2023 9.3  4.0 - 10.5 K/uL Final   RBC 03/19/2023 3.87  3.87 - 5.11 MIL/uL Final   Hemoglobin 03/19/2023 11.8 (L)  12.0 - 15.0 g/dL Final   HCT 84/69/6295 35.3 (L)  36.0 - 46.0 % Final   MCV 03/19/2023 91.2  80.0 - 100.0 fL Final   MCH 03/19/2023 30.5  26.0 - 34.0 pg Final   MCHC 03/19/2023 33.4  30.0 - 36.0 g/dL Final   RDW 28/41/3244 15.4  11.5 - 15.5 % Final   Platelets 03/19/2023 181  150 - 400 K/uL  Final   nRBC 03/19/2023 0.0  0.0 - 0.2 % Final   Neutrophils Relative % 03/19/2023 73  % Final   Neutro Abs 03/19/2023 6.8  1.7 - 7.7 K/uL Final   Lymphocytes Relative 03/19/2023 15  % Final   Lymphs Abs 03/19/2023 1.4  0.7 - 4.0 K/uL Final   Monocytes Relative 03/19/2023 10  % Final   Monocytes Absolute 03/19/2023 0.9  0.1 - 1.0 K/uL Final   Eosinophils Relative 03/19/2023 0  % Final   Eosinophils Absolute 03/19/2023 0.0  0.0 - 0.5 K/uL Final   Basophils Relative 03/19/2023 1  % Final   Basophils Absolute 03/19/2023 0.1  0.0 - 0.1 K/uL Final   Immature Granulocytes 03/19/2023 1  % Final   Abs Immature Granulocytes 03/19/2023 0.06  0.00 - 0.07 K/uL Final   Performed at [facility], [address] [place], [place] [address]   Sodium 03/19/2023 132 (L)  135 - 145 mmol/L Final   Potassium 03/19/2023 2.8 (L)  3.5 - 5.1 mmol/L Final   Chloride 03/19/2023 95 (L)  98 - 111 mmol/L Final   CO2 03/19/2023 24  22 - 32 mmol/L Final   Glucose, Bld 03/19/2023 97  70 - 99 mg/dL Final   Glucose reference range applies  only to samples taken after fasting for at least 8 hours.   BUN 03/19/2023 21  8 - 23 mg/dL Final   Creatinine, Ser 03/19/2023 0.95  0.44 - 1.00 mg/dL Final   Calcium 16/09/9603 9.0  8.9 - 10.3 mg/dL Final   Total Protein 54/08/8118 7.1  6.5 - 8.1 g/dL Final   Albumin 14/78/2956 4.5  3.5 - 5.0 g/dL Final   AST 21/30/8657 37  15 - 41 U/L Final   ALT 03/19/2023 29  0 - 44 U/L Final   Alkaline Phosphatase 03/19/2023 81  38 - 126 U/L Final   Total Bilirubin 03/19/2023 2.9 (H)  0.3 - 1.2 mg/dL Final   GFR, Estimated 03/19/2023 >60  >60 mL/min Final   Comment: (NOTE) Calculated using the CKD-EPI Creatinine Equation (2021)    Anion gap 03/19/2023 13  5 - 15 Final   Performed at [facility], [address] [place], [place] [address]   Alcohol, Ethyl (B) 03/19/2023 <10  <10 mg/dL Final   Comment: (NOTE) Lowest detectable limit for serum alcohol is 10 mg/dL.  For medical  purposes only. Performed at [facility], [address], [place], [place] [address]    POC Amphetamine UR 03/21/2023 None Detected  NONE DETECTED (Cut Off Level 1000 ng/mL) Final   POC Secobarbital (BAR) 03/21/2023 None Detected  NONE DETECTED (Cut Off Level 300 ng/mL) Final   POC Buprenorphine (BUP) 03/21/2023 None Detected  NONE DETECTED (Cut Off Level 10 ng/mL) Final   POC Oxazepam (BZO) 03/21/2023 Positive (A)  NONE DETECTED (Cut Off Level 300 ng/mL) Final   POC Cocaine UR 03/21/2023 None Detected  NONE DETECTED (Cut Off Level 300 ng/mL) Final   POC Methamphetamine UR 03/21/2023 None Detected  NONE DETECTED (Cut Off Level 1000 ng/mL) Final   POC Morphine 03/21/2023 None Detected  NONE DETECTED (Cut Off Level 300 ng/mL) Final   POC Methadone UR 03/21/2023 None Detected  NONE DETECTED (Cut Off Level 300 ng/mL) Final   POC Oxycodone UR 03/21/2023 None Detected  NONE DETECTED (Cut Off Level 100 ng/mL) Final   POC Marijuana UR 03/21/2023 None Detected  NONE DETECTED (Cut Off Level 50 ng/mL) Final   Color, Urine 03/19/2023 AMBER (A)  YELLOW Final   BIOCHEMICALS MAY BE AFFECTED BY COLOR   APPearance 03/19/2023 CLOUDY (A)  CLEAR Final   Specific Gravity, Urine 03/19/2023 1.025  1.005 - 1.030 Final   pH 03/19/2023 5.0  5.0 - 8.0 Final   Glucose, UA 03/19/2023 NEGATIVE  NEGATIVE mg/dL Final   Hgb urine dipstick 03/19/2023 NEGATIVE  NEGATIVE Final   Bilirubin Urine 03/19/2023 MODERATE (A)  NEGATIVE Final   Ketones, ur 03/19/2023 5 (A)  NEGATIVE mg/dL Final   Protein, ur 41/32/4401 100 (A)  NEGATIVE mg/dL Final   Nitrite 02/72/5366 NEGATIVE  NEGATIVE Final   Leukocytes,Ua 03/19/2023 MODERATE (A)  NEGATIVE Final   RBC / HPF 03/19/2023 0-5  0 - 5 RBC/hpf Final   WBC, UA 03/19/2023 21-50  0 - 5 WBC/hpf Final   Bacteria, UA 03/19/2023 MANY (A)  NONE SEEN Final   Squamous Epithelial / HPF 03/19/2023 6-10  0 - 5 /HPF Final   Mucus 03/19/2023 PRESENT   Final   Hyaline Casts, UA 03/19/2023  PRESENT   Final   Non Squamous Epithelial 03/19/2023 0-5 (A)  NONE SEEN Final   Performed at [facility], [address], [place], [place] [address]   Magnesium 03/19/2023 2.2  1.7 - 2.4 mg/dL  Final   Performed at [facility], [address] [place], [place] [address]   Vitamin B-12 03/19/2023 398  180 - 914 pg/mL Final   Comment: (NOTE) This assay is not validated for testing neonatal or myeloproliferative syndrome specimens for Vitamin B12 levels. Performed at [facility], [address], [place], [place] [address]    SARSCOV2ONAVIRUS 2 AG 03/19/2023 NEGATIVE  NEGATIVE Final   Comment: (NOTE) SARS-CoV-2 antigen NOT DETECTED.   Negative results are presumptive.  Negative results do not preclude SARS-CoV-2 infection and should not be used as the sole basis for treatment or other patient management decisions, including infection  control decisions, particularly in the presence of clinical signs and  symptoms consistent with COVID-19, or in those who have been in contact with the virus.  Negative results must be combined with clinical observations, patient history, and epidemiological information. The expected result is Negative.  Fact Sheet for Patients: https://www.jennings-kim.com/  Fact Sheet for Healthcare Providers: https://alexander-rogers.biz/  This test is not yet approved or cleared by the [place] FDA and  has been authorized for detection and/or diagnosis of SARS-CoV-2 by FDA under an Emergency Use Authorization (EUA).  This EUA will remain in effect (meaning this test can be used) for the duration of  the COV                          ID-19 declaration under Section 564(b)(1) of the Act, 21 U.S.C. section 360bbb-3(b)(1), unless the authorization is terminated or revoked sooner.     Sodium 03/22/2023 135  135 - 145 mmol/L Final   Potassium 03/22/2023 3.6  3.5 - 5.1 mmol/L Final   Chloride 03/22/2023 99  98 - 111  mmol/L Final   CO2 03/22/2023 27  22 - 32 mmol/L Final   Glucose, Bld 03/22/2023 97  70 - 99 mg/dL Final   Glucose reference range applies only to samples taken after fasting for at least 8 hours.   BUN 03/22/2023 22  8 - 23 mg/dL Final   Creatinine, Ser 03/22/2023 0.72  0.44 - 1.00 mg/dL Final   Calcium 09/81/1914 9.4  8.9 - 10.3 mg/dL Final   Total Protein 78/29/5621 6.9  6.5 - 8.1 g/dL Final   Albumin 30/86/5784 3.8  3.5 - 5.0 g/dL Final   AST 69/62/9528 16  15 - 41 U/L Final   ALT 03/22/2023 18  0 - 44 U/L Final   Alkaline Phosphatase 03/22/2023 63  38 - 126 U/L Final   Total Bilirubin 03/22/2023 0.7  0.3 - 1.2 mg/dL Final   GFR, Estimated 03/22/2023 >60  >60 mL/min Final   Comment: (NOTE) Calculated using the CKD-EPI Creatinine Equation (2021)    Anion gap 03/22/2023 9  5 - 15 Final   Performed at [facility], [address] [place], [place] [address]  Admission on 02/11/2023, Discharged on 02/17/2023  Component Date Value Ref Range Status   Alcohol, Ethyl (B) 02/11/2023 72 (H)  <10 mg/dL Final   Comment: (NOTE) Lowest detectable limit for serum alcohol is 10 mg/dL.  For medical purposes only. Performed at [facility], [address] [place], [place] [address]    Sodium 02/11/2023 136  135 - 145 mmol/L Final   Potassium 02/11/2023 3.2 (L)  3.5 - 5.1 mmol/L Final   Chloride 02/11/2023 97 (L)  98 - 111 mmol/L Final   CO2 02/11/2023 24  22 - 32 mmol/L Final  Glucose, Bld 02/11/2023 97  70 - 99 mg/dL Final   Glucose reference range applies only to samples taken after fasting for at least 8 hours.   BUN 02/11/2023 7 (L)  8 - 23 mg/dL Final   Creatinine, Ser 02/11/2023 0.47  0.44 - 1.00 mg/dL Final   Calcium 16/09/9603 9.2  8.9 - 10.3 mg/dL Final   Total Protein 54/08/8118 7.7  6.5 - 8.1 g/dL Final   Albumin 14/78/2956 4.7  3.5 - 5.0 g/dL Final   AST 21/30/8657 35  15 - 41 U/L Final   ALT 02/11/2023 34  0 - 44 U/L Final   Alkaline Phosphatase 02/11/2023 91   38 - 126 U/L Final   Total Bilirubin 02/11/2023 1.7 (H)  0.3 - 1.2 mg/dL Final   GFR, Estimated 02/11/2023 >60  >60 mL/min Final   Comment: (NOTE) Calculated using the CKD-EPI Creatinine Equation (2021)    Anion gap 02/11/2023 15  5 - 15 Final   Performed at [facility], [address], [place], [place] [address]   WBC 02/11/2023 9.2  4.0 - 10.5 K/uL Final   RBC 02/11/2023 4.23  3.87 - 5.11 MIL/uL Final   Hemoglobin 02/11/2023 13.4  12.0 - 15.0 g/dL Final   HCT 29/52/8413 38.8  36.0 - 46.0 % Final   MCV 02/11/2023 91.7  80.0 - 100.0 fL Final   MCH 02/11/2023 31.7  26.0 - 34.0 pg Final   MCHC 02/11/2023 34.5  30.0 - 36.0 g/dL Final   RDW 24/40/1027 13.0  11.5 - 15.5 % Final   Platelets 02/11/2023 422 (H)  150 - 400 K/uL Final   nRBC 02/11/2023 0.0  0.0 - 0.2 % Final   Performed at [facility], [address], [place], [place] [address]   Magnesium 02/11/2023 2.0  1.7 - 2.4 mg/dL Final   Performed at [facility], [address] [place], [place] [address]   Phosphorus 02/11/2023 3.7  2.5 - 4.6 mg/dL Final   Performed at [facility], [address], [place], [place] [address]   Vitamin B-12 02/11/2023 252  180 - 914 pg/mL Final   Comment: (NOTE) This assay is not validated for testing neonatal or myeloproliferative syndrome specimens for Vitamin B12 levels. Performed at [facility], [address], [place], [place] [address]    Folate 02/11/2023 6.3  >5.9 ng/mL Final   Performed at [facility], [address], [place], [place] [address]   TSH 02/11/2023 3.115  0.350 - 4.500 uIU/mL Final   Comment: Performed by a 3rd Generation assay with a functional sensitivity of <=0.01 uIU/mL. Performed at [facility], [address] [place], [place] [address]    SARS Coronavirus 2 by RT PCR 02/11/2023 NEGATIVE  NEGATIVE Final   Influenza A by PCR 02/11/2023 NEGATIVE  NEGATIVE Final   Influenza B by PCR 02/11/2023 NEGATIVE  NEGATIVE Final    Comment: (NOTE) The Xpert Xpress SARS-CoV-2/FLU/RSV plus assay is intended as an aid in the diagnosis of influenza from Nasopharyngeal swab specimens and should not be used as a sole basis for treatment. Nasal washings and aspirates are unacceptable for Xpert Xpress SARS-CoV-2/FLU/RSV testing.  Fact Sheet for Patients: BloggerCourse.com  Fact Sheet for Healthcare Providers: SeriousBroker.it  This test is not yet approved or cleared by the [place] FDA and has been authorized for detection and/or diagnosis of SARS-CoV-2 by FDA under an Emergency Use Authorization (EUA). This EUA will  remain in effect (meaning this test can be used) for the duration of the COVID-19 declaration under Section 564(b)(1) of the Act, 21 U.S.C. section 360bbb-3(b)(1), unless the authorization is terminated or revoked.     Resp Syncytial Virus by PCR 02/11/2023 NEGATIVE  NEGATIVE Final   Comment: (NOTE) Fact Sheet for Patients: BloggerCourse.com  Fact Sheet for Healthcare Providers: SeriousBroker.it  This test is not yet approved or cleared by the [place] FDA and has been authorized for detection and/or diagnosis of SARS-CoV-2 by FDA under an Emergency Use Authorization (EUA). This EUA will remain in effect (meaning this test can be used) for the duration of the COVID-19 declaration under Section 564(b)(1) of the Act, 21 U.S.C. section 360bbb-3(b)(1), unless the authorization is terminated or revoked.  Performed at [facility], [address], [place], [place] [address]    Potassium 02/14/2023 4.2  3.5 - 5.1 mmol/L Final   Performed at [facility], [address] [place], [place] [address]    Blood Alcohol level:  Lab Results  Component Value Date   ETH <10 05/25/2023   ETH <10 03/19/2023    Metabolic Disorder Labs: Lab Results  Component Value Date   HGBA1C 4.9  05/25/2023   MPG 93.93 05/25/2023   No results found for: "PROLACTIN" Lab Results  Component Value Date   CHOL 145 05/25/2023   TRIG 65 05/25/2023   HDL 91 05/25/2023   CHOLHDL 1.6 05/25/2023   VLDL 13 05/25/2023   LDLCALC 41 05/25/2023    Therapeutic Lab Levels: No results found for: "LITHIUM" No results found for: "VALPROATE" No results found for: "CBMZ"  Physical Findings   AUDIT    Flowsheet Row ED from 05/25/2023 in [facility] Emergency Department at [facility]  Alcohol Use Disorder Identification Test Final Score (AUDIT) 27      PHQ2-9    Flowsheet Row ED from 05/25/2023 in [facility] ED from 03/19/2023 in [facility] ED from 02/11/2023 in [place]  PHQ-2 Total Score 6 3 1   PHQ-9 Total Score 13 3 4       Flowsheet Row ED from 05/25/2023 in [facility] Most recent reading at 05/25/2023  5:34 PM ED from 05/25/2023 in [facility] Most recent reading at 05/25/2023  4:27 PM ED from 05/25/2023 in [facility] Emergency Department at [facility] Most recent reading at 05/25/2023 11:00 AM  C-SSRS RISK CATEGORY No Risk No Risk No Risk        Musculoskeletal  Strength & Muscle Tone: decreased Gait & Station: normal, but slowed Patient leans: N/A  Psychiatric Specialty Exam  Presentation  General Appearance:  Disheveled  Eye Contact: Good  Speech: Clear and Coherent; Normal Rate  Speech Volume: Normal  Handedness: Right   Mood and Affect  Mood: Euthymic  Affect: Appropriate; Congruent   Thought Process  Thought Processes: Coherent; Goal Directed  Descriptions of Associations:Intact  Orientation:Full (Time, Place and Person)  Thought Content:WDL  Diagnosis of Schizophrenia or Schizoaffective disorder in past: No    Hallucinations:Hallucinations: None   Ideas of  Reference:None  Suicidal Thoughts:Suicidal Thoughts: No   Homicidal Thoughts:Homicidal Thoughts: No    Sensorium  Memory: Immediate Fair; Recent Fair  Judgment: Poor  Insight: Fair; Shallow   Executive Functions  Concentration: Fair  Attention Span: Fair  Recall: [employer] of Knowledge: Fair  Language: Fair   Psychomotor Activity  Psychomotor Activity: Psychomotor Activity: Normal    Assets  Assets: Communication Skills; Desire for Improvement; Social Support; Health and safety inspector; Housing; Physical Health; Resilience   Sleep  Sleep: Sleep: Good    No data recorded   Physical Exam  Physical Exam Vitals reviewed.  Constitutional:      General: She is not in acute distress.    Appearance: She is not ill-appearing or diaphoretic.  HENT:     Head: Normocephalic and atraumatic.  Pulmonary:     Effort: Pulmonary effort is normal.  Skin:    General: Skin is warm.     Coloration: Skin is not jaundiced.  Neurological:     Mental Status: She is alert and oriented to person, place, and time.     Motor: Weakness present.     Gait: Gait abnormal.    Review of Systems  Gastrointestinal:  Positive for diarrhea. Negative for nausea.  Neurological:  Positive for dizziness. Negative for tremors, seizures, weakness and headaches.   Blood pressure 130/72, pulse 99, temperature 98 F (36.7 C), temperature source Oral, resp. rate 19, SpO2 99 %. There is no height or weight on file to calculate BMI.  Treatment Plan Summary: Daily contact with patient to assess and evaluate symptoms and progress in treatment and Medication management  NEESA KEITH is a 72 y.o. female patient who presented voluntarily [facility] seeking alcohol detox, for which she was admitted to [facility]. Patient has been admitted to [facility] multiple times, and now only has [facility] in [place] as an option for treatment; [facility] has not yet returned communication with LCSW. LCSW  assisting with dispo planning for patient and to reach out to daughter, but if patient is not agreeable to [facility], patient may be without treatment options and would subsequently have to discharge home.   #Alcohol Use Disorder CIWA Librium protocol initiated: - chlordiazepoxide (Librium) 25 mg 4 times daily x4 doses (completed), 25 mg 3 times daily x3 doses,25 mg 2 times daily x2 doses, 25 mg daily x1 dose -chlordiazepoxide (LIbrium) 25 mg every 6 hours as needed for CIWA greater than 10; Hydroxyzine 25 mg for CIWA less than 10 -Multivitamin with minerals 1 tablet daily -Ondansetron disintegrating tablet 4 mg every 6 as needed/nausea or vomiting -Loperamide 2 to 4 mg oral as needed/diarrhea or loose stools -Thiamine injection 100 mg IM once followed by PO daily -- Hold home Naltrexone while detoxing; to be resumed   #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

## 2023-05-27 NOTE — BH IP Treatment Plan (Signed)
Interdisciplinary Treatment and Diagnostic Plan Update  05/26/2023 Time of Session: 9:35AM Stephanie Riley MRN: [id number]  Diagnosis:  Final diagnoses:  Alcohol use disorder     Current Medications:  Current Facility-Administered Medications  Medication Dose Route Frequency Provider Last Rate Last Admin   acetaminophen (TYLENOL) tablet 650 mg  650 mg Oral Q6H PRN Sunday Corn, NP       alum & mag hydroxide-simeth (MAALOX/MYLANTA) 200-200-20 MG/5ML suspension 30 mL  30 mL Oral Q4H PRN Sunday Corn, NP       amLODipine (NORVASC) tablet 10 mg  10 mg Oral Daily Sunday Corn, NP   10 mg at 05/27/23 4098   busPIRone (BUSPAR) tablet 5 mg  5 mg Oral BID Sunday Corn, NP   5 mg at 05/27/23 1191   chlordiazePOXIDE (LIBRIUM) capsule 25 mg  25 mg Oral Q6H PRN Sunday Corn, NP       chlordiazePOXIDE (LIBRIUM) capsule 25 mg  25 mg Oral TID Sunday Corn, NP   25 mg at 05/27/23 4782   Followed by   Melene Muller ON 05/28/2023] chlordiazePOXIDE (LIBRIUM) capsule 25 mg  25 mg Oral BH-qamhs Sunday Corn, NP       Followed by   Melene Muller ON 05/29/2023] chlordiazePOXIDE (LIBRIUM) capsule 25 mg  25 mg Oral Daily Sunday Corn, NP       losartan (COZAAR) tablet 100 mg  100 mg Oral Daily Nelly Rout, MD   100 mg at 05/27/23 9562   And   hydrochlorothiazide (HYDRODIURIL) tablet 12.5 mg  12.5 mg Oral Daily Nelly Rout, MD   12.5 mg at 05/27/23 1308   hydrOXYzine (ATARAX) tablet 25 mg  25 mg Oral Q6H PRN Sunday Corn, NP       loperamide (IMODIUM) capsule 2-4 mg  2-4 mg Oral PRN Sunday Corn, NP   4 mg at 05/27/23 6578   magnesium hydroxide (MILK OF MAGNESIA) suspension 30 mL  30 mL Oral Daily PRN Sunday Corn, NP       mirtazapine (REMERON) tablet 7.5 mg  7.5 mg Oral QHS Sunday Corn, NP   7.5 mg at 05/26/23 2111   multivitamin with minerals tablet 1 tablet  1 tablet Oral Daily Sunday Corn, NP   1 tablet at 05/27/23 0918   ondansetron (ZOFRAN-ODT)  disintegrating tablet 4 mg  4 mg Oral Q6H PRN Sunday Corn, NP   4 mg at 05/25/23 1826   traZODone (DESYREL) tablet 100 mg  100 mg Oral QHS PRN Sunday Corn, NP   100 mg at 05/26/23 2111   Current Outpatient Medications  Medication Sig Dispense Refill   amLODipine (NORVASC) 10 MG tablet Take 1 tablet (10 mg total) by mouth daily for 7 days. 7 tablet 0   busPIRone (BUSPAR) 15 MG tablet Take 1 tablet (15 mg total) by mouth 2 (two) times daily for 7 days. 14 tablet 0   Cyanocobalamin (VITAMIN B-12 PO) Take 1 tablet by mouth daily.     hydrOXYzine (ATARAX) 25 MG tablet Take 1 tablet (25 mg total) by mouth 2 (two) times daily as needed for up to 14 days. 12 tablet 0   loperamide (IMODIUM) 2 MG capsule Take 2 mg by mouth daily as needed for diarrhea or loose stools.     losartan-hydrochlorothiazide (HYZAAR) 100-12.5 MG tablet Take 1 tablet by mouth daily for 7 days. 7 tablet 0   mirtazapine (REMERON) 7.5 MG tablet Take 1 tablet (  7.5 mg total) by mouth at bedtime for 7 days. 7 tablet 0   naltrexone (DEPADE) 50 MG tablet Take 1 tablet (50 mg total) by mouth daily for 7 days. 7 tablet 0   ondansetron (ZOFRAN) 4 MG tablet Take 4 mg by mouth every 8 (eight) hours as needed for nausea or vomiting.     sertraline (ZOLOFT) 100 MG tablet Take 100 mg by mouth daily.     traZODone (DESYREL) 100 MG tablet Take 1 tablet (100 mg total) by mouth at bedtime for 7 days. (Patient taking differently: Take 150 mg by mouth at bedtime.) 7 tablet 0   PTA Medications: Prior to Admission medications   Medication Sig Start Date End Date Taking? Authorizing Provider  amLODipine (NORVASC) 10 MG tablet Take 1 tablet (10 mg total) by mouth daily for 7 days. 05/25/23 06/01/23 Yes Sloan Leiter, DO  busPIRone (BUSPAR) 15 MG tablet Take 1 tablet (15 mg total) by mouth 2 (two) times daily for 7 days. 05/25/23 06/01/23 Yes Tanda Rockers A, DO  Cyanocobalamin (VITAMIN B-12 PO) Take 1 tablet by mouth daily.   Yes [provider]  hydrOXYzine (ATARAX) 25 MG tablet Take 1 tablet (25 mg total) by mouth 2 (two) times daily as needed for up to 14 days. 05/25/23 06/08/23 Yes Sloan Leiter, DO  loperamide (IMODIUM) 2 MG capsule Take 2 mg by mouth daily as needed for diarrhea or loose stools. 03/26/23  Yes [provider]  losartan-hydrochlorothiazide (HYZAAR) 100-12.5 MG tablet Take 1 tablet by mouth daily for 7 days. 05/25/23 06/01/23 Yes Tanda Rockers A, DO  mirtazapine (REMERON) 7.5 MG tablet Take 1 tablet (7.5 mg total) by mouth at bedtime for 7 days. 05/25/23 06/01/23 Yes Tanda Rockers A, DO  naltrexone (DEPADE) 50 MG tablet Take 1 tablet (50 mg total) by mouth daily for 7 days. 05/25/23 06/01/23 Yes Tanda Rockers A, DO  ondansetron (ZOFRAN) 4 MG tablet Take 4 mg by mouth every 8 (eight) hours as needed for nausea or vomiting.   Yes [provider]  sertraline (ZOLOFT) 100 MG tablet Take 100 mg by mouth daily. 04/01/23  Yes [provider]  traZODone (DESYREL) 100 MG tablet Take 1 tablet (100 mg total) by mouth at bedtime for 7 days. Patient taking differently: Take 150 mg by mouth at bedtime. 05/25/23 06/01/23 Yes Sloan Leiter, DO    Patient Stressors: Marital or family conflict   Substance abuse    Patient Strengths: General fund of knowledge   Treatment Modalities: Medication Management, Group therapy, Case management,  1 to 1 session with clinician, Psychoeducation, Recreational therapy.   Physician Treatment Plan for Primary and Secondary Diagnosis:  Final diagnoses:  Alcohol use disorder   Long Term Goal(s): Improvement in symptoms so as ready for discharge  Short Term Goals: Patient will verbalize feelings in meetings with treatment team members. Patient will attend at least of 50% of the groups daily. Pt will complete the PHQ9 on admission, day 3 and discharge. Patient will participate in completing the [place] Suicide Severity Rating Scale Patient will score a low risk of  violence for 24 hours prior to discharge Patient will take medications as prescribed daily.  Medication Management: Evaluate patient's response, side effects, and tolerance of medication regimen.  Therapeutic Interventions: 1 to 1 sessions, Unit Group sessions and Medication administration.  Evaluation of Outcomes: Progressing  LCSW Treatment Plan for Primary Diagnosis:  Final diagnoses:  Alcohol use disorder    Long Term Goal(s):  Safe transition to appropriate next level of care at discharge.  Short Term Goals: Facilitate acceptance of mental health diagnosis and concerns through verbal commitment to aftercare plan and appointments at discharge., Patient will identify one social support prior to discharge to aid in patient's recovery., Patient will attend AA/NA groups as scheduled., Identify minimum of 2 triggers associated with mental health/substance abuse issues with treatment team members., and Increase skills for wellness and recovery by attending 50% of scheduled groups.  Therapeutic Interventions: Assess for all discharge needs, 1 to 1 time with Child psychotherapist, Explore available resources and support systems, Assess for adequacy in community support network, Educate family and significant other(s) on suicide prevention, Complete Psychosocial Assessment, Interpersonal group therapy.  Evaluation of Outcomes: Progressing   Progress in Treatment: Attending groups: Yes. Participating in groups: Yes. Taking medication as prescribed: Yes. Toleration medication: Yes. Family/Significant other contact made: No, will contact:  Patient's daughter as needed.  Patient understands diagnosis: Yes. Discussing patient identified problems/goals with staff: Yes. Medical problems stabilized or resolved: Yes. Denies suicidal/homicidal ideation: Yes. Issues/concerns per patient self-inventory: Yes. Other: alcohol use and need for further treatment  New problem(s) identified: No, Describe:  other  than reported on admission  New Short Term/Long Term Goal(s): Safe transition to appropriate next level of care at discharge, Engage patient in therapeutic group addressing interpersonal concerns. Engage patient in aftercare planning with referrals and resources, Increase ability to appropriately verbalize feelings, Facilitate acceptance of mental health diagnosis and concerns and Identify triggers associated with mental health/substance abuse issues.   Patient Goals:  Patient is seeking residential placement at this time for substance use.   Discharge Plan or Barriers: LCSW will send referrals out for review for residential placement. Updates will be provided as received. Insurance may be a barrier for placement.   Reason for Continuation of Hospitalization: Withdrawal symptoms  Estimated Length of Stay: 3-5 days  Last 3 [place] Suicide Severity Risk Score: Flowsheet Row ED from 05/25/2023 in [facility] Most recent reading at 05/25/2023  5:34 PM ED from 05/25/2023 in [facility] Most recent reading at 05/25/2023  4:27 PM ED from 05/25/2023 in [facility] Emergency Department at [facility] Most recent reading at 05/25/2023 11:00 AM  C-SSRS RISK CATEGORY No Risk No Risk No Risk       Last PHQ 2/9 Scores:    05/25/2023    5:15 PM 03/26/2023    9:01 AM 03/23/2023    5:02 PM  Depression screen PHQ 2/9  Decreased Interest 3 0 1  Down, Depressed, Hopeless 3 3 1   PHQ - 2 Score 6 3 2   Altered sleeping 1 0 0  Tired, decreased energy 2 0 1  Change in appetite 1 0 0  Feeling bad or failure about yourself  1 0 0  Trouble concentrating 2 0 2  Moving slowly or fidgety/restless 0 0 0  Suicidal thoughts 0 0 0  PHQ-9 Score 13 3 5   Difficult doing work/chores Very difficult      Scribe for Treatment Team: Lenny Pastel 05/27/2023 9:38 AM

## 2023-05-27 NOTE — Discharge Planning (Signed)
Patient has been accepted to [facility] in [place] for further treatment and can admit as early as tomorrow 05/28/2023. Per Admissions Coordinator, transportation department will follow up to inquire about transportation needs. Patient reported that she would like to go home and pack her belongings before she goes. Patient informed that this would not be appropriate, and explored if anyone else has access to her home and can assist. Patient reports her daughter and husband has access to her home, however she would only allow her daughter to go into the home. Patient provided LCSW with permission to speak with her daughter Baxter Hire to provide an update and discuss plan.   LCSW contacted patient's daughter Beverly Milch [phone] to provide an update. Daughter is agreeable to plan of patient admitting to [facility]. Daughter reports she is working to arrange transport for the patient and will follow up once solidified. LCSW, patient, and daughter conversed regarding the plan and patient is in agreement. Current plan is for patient to discharge on Saturday to the facility as this was a late notice for the daughter to plan for tomorrow. LCSW will provide further updates as received. MD and team provided updates.   LCSW will continue to follow and provide support to patient while in [facility]   Fernande Boyden, LCSW Clinical Social Worker [place] Ph: [phone]

## 2023-05-27 NOTE — Progress Notes (Signed)
Pt c/o nausea secondary to increased anxiety r/t her discharge plan. Pt given PRNs as appropriate.

## 2023-05-27 NOTE — Group Note (Signed)
Group Topic: Wellness  Group Date: 05/27/2023 Start Time: 1130 End Time: 1210 Facilitators: Dickie La, RN  Department: [facility]  Number of Participants: 2  Group Focus: other Nutrition Treatment Modality:  Psychoeducation Interventions utilized were patient education Purpose: increase insight  Name: Stephanie Riley Date of Birth: 1951/06/09  MR: [id number]    Level of Participation: active Quality of Participation: attentive and engaged Interactions with others: gave feedback Mood/Affect: appropriate Triggers (if applicable): none Cognition: coherent/clear and insightful Progress: Gaining insight Response: Pt participated and gave a positive feedback. Plan: follow-up needed  Patients Problems:  Patient Active Problem List   Diagnosis Date Noted   Alcohol use disorder 03/19/2023   Alcohol use disorder, moderate, dependence (HCC) 02/12/2023   Benzodiazepine withdrawal without complication (HCC) 02/12/2023

## 2023-05-27 NOTE — ED Notes (Signed)
Patient is sleeping. Respirations equal and unlabored, skin warm and dry. No change in assessment or acuity. Routine safety checks conducted according to facility protocol. Will continue to monitor for safety.   

## 2023-05-27 NOTE — Group Note (Signed)
Group Topic: Relapse and Recovery  Group Date: 05/27/2023 Start Time: 2000 End Time: 2100 Facilitators: Emmit Pomfret D, NT  Department: [facility]  Number of Participants: 2  Group Focus: relapse prevention Treatment Modality:  Psychoeducation Interventions utilized were support Purpose: relapse prevention strategies  Name: Stephanie Riley Date of Birth: 21-Mar-1951  MR: [id number]    Level of Participation: moderate Quality of Participation: attentive Interactions with others: gave feedback Mood/Affect: appropriate Triggers (if applicable): n/a Cognition: concrete Progress: Significant Response: n/a Plan: follow-up needed  Patients Problems:  Patient Active Problem List   Diagnosis Date Noted   Alcohol use disorder 03/19/2023   Alcohol use disorder, moderate, dependence (HCC) 02/12/2023   Benzodiazepine withdrawal without complication (HCC) 02/12/2023

## 2023-05-27 NOTE — Progress Notes (Signed)
[name] CIWA was 0. 

## 2023-05-27 NOTE — ED Notes (Signed)
Pt was given lunch

## 2023-05-27 NOTE — ED Notes (Signed)
Pt reports nausea is decreased.

## 2023-05-27 NOTE — Group Note (Signed)
Group Topic: Relaxation  Group Date: 05/27/2023 Start Time: 1020 End Time: 1130 Facilitators: Londell Moh, NT  Department: [facility]  Number of Participants: 4  Group Focus: relaxation Treatment Modality:  Psychoeducation Interventions utilized were patient education Purpose: increase insight  Name: Stephanie Riley Date of Birth: 1951/10/12  MR: [id number]    Level of Participation: Pt did not attend group. Pt stated she was feeling sick. Patients Problems:  Patient Active Problem List   Diagnosis Date Noted   Alcohol use disorder 03/19/2023   Alcohol use disorder, moderate, dependence (HCC) 02/12/2023   Benzodiazepine withdrawal without complication (HCC) 02/12/2023

## 2023-05-27 NOTE — Group Note (Signed)
Group Topic: Balance in Life  Group Date: 05/27/2023 Start Time: 0800 End Time: 0845 Facilitators: Rae Lips B  Department: [facility]  Number of Participants: 3  Group Focus: acceptance Treatment Modality:  Cognitive Behavioral Therapy Interventions utilized were leisure development Purpose: express feelings  Name: Stephanie Riley Date of Birth: [birth year]  MR: [id number]    Level of Participation: active Quality of Participation: attentive Interactions with others: gave feedback Mood/Affect: appropriate Triggers (if applicable): NA Cognition: concrete Progress: Gaining insight Response: NA Plan: patient will be encouraged to keep going to groups.   Patients Problems:  Patient Active Problem List   Diagnosis Date Noted   Alcohol use disorder 03/19/2023   Alcohol use disorder, moderate, dependence (HCC) 02/12/2023   Benzodiazepine withdrawal without complication (HCC) 02/12/2023

## 2023-05-27 NOTE — Progress Notes (Signed)
Pt is asleep. Respirations are even and unlabored. No signs of acute distress noted. Staff will monitor for pt's safety. 

## 2023-05-27 NOTE — ED Notes (Signed)
Pt asleep at this hour. No apparent distress. RR even and unlabored. Monitored for safety.  

## 2023-05-27 NOTE — ED Notes (Addendum)
Patient denies SI/HI and AVH. Patient has eaten breakfast and taken her scheduled medications. Patient requested 4 mg of Imodium for loose stools. Patient is resting in her room. Patient is being monitored for safety.
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
## 2023-05-27 NOTE — ED Notes (Signed)
Pt was given dinner 

## 2023-05-27 NOTE — ED Notes (Signed)
Pt at nurse's station at this hour. No apparent distress. RR even and unlabored. Monitored for safety.  

## 2023-05-27 NOTE — Discharge Planning (Signed)
Referrals were sent to the following facilities for review:   [facility]: LCSW has called multiple times and has not received an answer. LCSW will continue to follow up regarding referral.  [facility]: Patient can call in the complete phone screening.  [facility] in [place]: No beds available until June 11, 2023.  [facility]: Patient can call in the complete phone screening.   Information was provided to the patient to follow up with [facility] in [place] and [facility] (if interested in returning back). Patient reports she will make calls on today. LCSW will follow up at a later time for updates.   Fernande Boyden, LCSW Clinical Social Worker [place] Ph: [phone]

## 2023-05-27 NOTE — ED Notes (Signed)
Pt woke up said she was cold. RN turned air up and MHT got her another blanket.

## 2023-05-28 DIAGNOSIS — F1090 Alcohol use, unspecified, uncomplicated: Secondary | ICD-10-CM | POA: Diagnosis not present

## 2023-05-28 MED ORDER — HYDROXYZINE HCL 25 MG PO TABS: 25.0000 mg | ORAL_TABLET | Freq: Two times a day (BID) | ORAL | 0 refills | Status: AC | PRN

## 2023-05-28 MED ORDER — AMLODIPINE BESYLATE 10 MG PO TABS: 10.0000 mg | ORAL_TABLET | Freq: Every day | ORAL | 0 refills | Status: DC

## 2023-05-28 MED ORDER — LOPERAMIDE HCL 2 MG PO CAPS: 2.0000 mg | ORAL_CAPSULE | Freq: Every day | ORAL | 0 refills | Status: AC | PRN

## 2023-05-28 MED ORDER — BUSPIRONE HCL 15 MG PO TABS: 15.0000 mg | ORAL_TABLET | Freq: Two times a day (BID) | ORAL | 0 refills | Status: AC

## 2023-05-28 MED ORDER — NALTREXONE HCL 50 MG PO TABS: 50.0000 mg | ORAL_TABLET | Freq: Every day | ORAL | 0 refills | Status: AC

## 2023-05-28 MED ORDER — BUSPIRONE HCL 15 MG PO TABS
15.0000 mg | ORAL_TABLET | Freq: Two times a day (BID) | ORAL | Status: DC
  Administered 2023-05-28 – 2023-05-29 (×2): 15 mg via ORAL
  Filled 2023-05-28 (×2): qty 1

## 2023-05-28 MED ORDER — LOSARTAN POTASSIUM-HCTZ 100-12.5 MG PO TABS: 1.0000 | ORAL_TABLET | Freq: Every day | ORAL | 0 refills | Status: DC

## 2023-05-28 MED ORDER — TRAZODONE HCL 100 MG PO TABS: 100.0000 mg | ORAL_TABLET | Freq: Every evening | ORAL | 0 refills | Status: DC | PRN

## 2023-05-28 MED ORDER — HYDROXYZINE HCL 25 MG PO TABS
25.0000 mg | ORAL_TABLET | Freq: Three times a day (TID) | ORAL | Status: DC | PRN
  Administered 2023-05-28: 25 mg via ORAL

## 2023-05-28 MED ORDER — MIRTAZAPINE 7.5 MG PO TABS: 7.5000 mg | ORAL_TABLET | Freq: Every day | ORAL | 0 refills | Status: DC

## 2023-05-28 NOTE — ED Notes (Signed)
Patient remains asleep in bed without distress or complaint.  Patient is without withdrawal symptoms.  Will monitor.

## 2023-05-28 NOTE — ED Notes (Signed)
Pt asleep at this hour. No apparent distress. RR even and unlabored. Monitored for safety.  

## 2023-05-28 NOTE — Discharge Planning (Signed)
LCSW spoke with patients daughter on this morning regarding disposition plan. Per Daughter, [facility] has agreed to meet her half way in [place] to facilitate safe transport back to their facility. Arrangement time has been scheduled for 12:00pm. Daughter reports she would need to pick up the patient by 6:30am on tomorrow morning in order to meet this requirement. Daughter reports some concerns regarding the patient refusing to get out of the car once they have arrived. Daughter was provided brief supportive counseling by LCSW and MDs regarding plan and lack of options available for further treatment. This is the only option available to the patient at this time. Daughter expressed understanding and reported appreciation for advice provided. No other concerns were reported by the daughter at this time.   LCSW went and spoke with patient regarding the plan. Patient expressed understanding regarding and then shortly after reported "I don't want to go". Brief counseling was provided to the patient and conversation from yesterday was revisited between LCSW, patient, and daughter. Patient expressed understanding and reports being in agreement with plan. Patient aware that she will be picked up in the morning and transported to treatment. No other concerns were mentioned. LCSW will continue to follow and provide support to patient while on [facility] unit.   Fernande Boyden, LCSW Clinical Social Worker [facility] Ph: [phone]

## 2023-05-28 NOTE — ED Notes (Signed)
Pt states she feels good this morning. Slept well last night. CIWA=1.

## 2023-05-28 NOTE — ED Notes (Signed)
Pt is in the Dayroom watching TV. Respirations are even and unlabored. No acute distress noted. Will continue to monitor for safety.  

## 2023-05-28 NOTE — ED Notes (Signed)
Pt is in the bed sleeping. Respirations are even and unlabored. No acute distress noted. Will continue to monitor for safety. 

## 2023-05-28 NOTE — ED Notes (Signed)
Pt in room asleep.  Breathing is unlabored and even. Will continue to monitor for safety.  ?

## 2023-05-28 NOTE — Group Note (Signed)
Group Topic: Positive Affirmations  Group Date: 05/28/2023 Start Time: 1515 End Time: 1545 Facilitators: Jenean Lindau, RN  Department: [facility]  Number of Participants: 4  Group Focus: coping skills Treatment Modality:  Patient-Centered Therapy Interventions utilized were group exercise Purpose: explore maladaptive thinking, express feelings, increase insight, and regain self-worth  Name: Stephanie Riley Date of Birth: 07/22/1951  MR: [id number]    Level of Participation: active Quality of Participation: attentive and cooperative Interactions with others: gave feedback Mood/Affect: appropriate Triggers (if applicable):   Cognition: coherent/clear Progress: Gaining insight Response:   Plan: follow-up needed  Patients Problems:  Patient Active Problem List   Diagnosis Date Noted   Alcohol use disorder 03/19/2023   Alcohol use disorder, moderate, dependence (HCC) 02/12/2023   Benzodiazepine withdrawal without complication (HCC) 02/12/2023

## 2023-05-28 NOTE — Discharge Instructions (Signed)
Patient will be discharging on tomorrow 05/29/2023 to [facility] in [place], [place]: [phone] with transportation provided by her daughter Baxter Hire at 6:30am. Address is: [address] [place], [place] [address]  [facility] [address], [place], [address] [phone] phone  New Patient Assessment/Therapy Walk-Ins:  Monday and Wednesday: 8 am until slots are full. Every 1st and 2nd Fridays of the month: 1 pm - 5 pm.  NO ASSESSMENT/THERAPY WALK-INS ON TUESDAYS OR THURSDAYS  New Patient Assessment/Medication Management Walk-Ins:  Monday - Friday:  8 am - 11 am.  For all walk-ins, we ask that you arrive by 7:30 am because patients will be seen in the order of arrival.  Availability is limited; therefore, you may not be seen on the same day that you walk-in.  Our goal is to serve and meet the needs of our community to the best of our ability.  12 STEP PROGRAMS:  Alcoholics Anonymous of [place] [employer]  Narcotics Anonymous of [place] [employer]  Al-Anon of [place], [place] www.[place]alanon.org/find-meetings.html  Nar-Anon https://nar-anon.org/find-a-meetin

## 2023-05-28 NOTE — ED Provider Notes (Signed)
FBC/OBS ASAP Discharge Summary  Date and Time: 05/28/2023 6:15 PM  Name: Stephanie Riley  MRN:  [id number]   Discharge Diagnoses:  Final diagnoses:  Alcohol use disorder    Subjective: Stephanie Riley is a 72 y.o. female patient with a past psychiatric history of alcohol use disorder, MDD, GAD, benzodiazepine withdrawal with delirium, substance-induced mood disorder, insomnia, paranoia, adjustment disorder, and ADHD who presented voluntarily and unaccompanied to [facility] seeking alcohol detox, for which she was admitted to [facility].   Stay Summary: The patient was evaluated each day by a clinical provider to ascertain response to treatment. Improvement was noted by the patient's report of decreasing symptoms, improved sleep and appetite, affect, medication tolerance, behavior, and participation in unit programming.  Patient was asked each day to complete a self inventory noting mood, mental status, pain, new symptoms, anxiety and concerns.   Patient responded well to medication and being in a therapeutic and supportive environment. Positive and appropriate behavior was noted and the patient was motivated for recovery. The patient worked closely with the treatment team and case manager to develop a discharge plan with appropriate goals. Coping skills, problem solving as well as relaxation therapies were also part of the unit programming.   By the day of discharge patient was in much improved condition than upon admission.  Symptoms were reported as significantly decreased or resolved completely. The patient denied SI/HI and voiced no AVH. The patient was motivated to continue taking medication with a goal of continued improvement in mental health.    Total Time spent with patient: 30 minutes  Past Psychiatric History:  OPT: Hx of Adderall, Zoloft, Trazodone, Wellbutrin, Xanax, Klonopin, no psychiatry opt 12/2022- INPT at [facility] , patient noted to have yellow/orange sclera on arrival, but cleared by  discharge. Dx with Etoh use d/o,. MOCA was done due to cognitive concerns, scored 23/30. 2nd hosp in 2024- [facility], BZD withdrawal Dx: MDD, Etoh use disorder, ADHD, anxiety, and chronic prescribed BZD use. Therapy: marriage counseling Past Medical History: HTN Family History: Both parents had Alzhmier's  Family Psychiatric  History: na Social History: -Living alone, separated from husband -Daughter in [place] - was a Runner, broadcasting/film/video - enjoyed working out at least 1 year ago Tobacco Cessation:  N/A, patient does not currently use tobacco products  Current Medications:  Current Facility-Administered Medications  Medication Dose Route Frequency Provider Last Rate Last Admin   acetaminophen (TYLENOL) tablet 650 mg  650 mg Oral Q6H PRN Sunday Corn, NP       alum & mag hydroxide-simeth (MAALOX/MYLANTA) 200-200-20 MG/5ML suspension 30 mL  30 mL Oral Q4H PRN Sunday Corn, NP       amLODipine (NORVASC) tablet 10 mg  10 mg Oral Daily Sunday Corn, NP   10 mg at 05/28/23 0933   busPIRone (BUSPAR) tablet 15 mg  15 mg Oral BID Lamar Sprinkles, MD       chlordiazePOXIDE (LIBRIUM) capsule 25 mg  25 mg Oral BH-qamhs Sunday Corn, NP   25 mg at 05/28/23 0933   Followed by   Melene Muller ON 05/29/2023] chlordiazePOXIDE (LIBRIUM) capsule 25 mg  25 mg Oral Daily Sunday Corn, NP       losartan (COZAAR) tablet 100 mg  100 mg Oral Daily Nelly Rout, MD   100 mg at 05/28/23 4098   And   hydrochlorothiazide (HYDRODIURIL) tablet 12.5 mg  12.5 mg Oral Daily Nelly Rout, MD   12.5 mg at 05/28/23 [phone]  hydrOXYzine (ATARAX) tablet 25 mg  25 mg Oral Q8H PRN Lenard Lance, FNP   25 mg at 05/28/23 1800   magnesium hydroxide (MILK OF MAGNESIA) suspension 30 mL  30 mL Oral Daily PRN Sunday Corn, NP       mirtazapine (REMERON) tablet 7.5 mg  7.5 mg Oral QHS Sunday Corn, NP   7.5 mg at 05/27/23 2103   multivitamin with minerals tablet 1 tablet  1 tablet Oral Daily Sunday Corn, NP   1  tablet at 05/28/23 0933   traZODone (DESYREL) tablet 100 mg  100 mg Oral QHS PRN Sunday Corn, NP   100 mg at 05/27/23 2103   Current Outpatient Medications  Medication Sig Dispense Refill   amLODipine (NORVASC) 10 MG tablet Take 1 tablet (10 mg total) by mouth daily. 30 tablet 0   busPIRone (BUSPAR) 15 MG tablet Take 1 tablet (15 mg total) by mouth 2 (two) times daily. 60 tablet 0   hydrOXYzine (ATARAX) 25 MG tablet Take 1 tablet (25 mg total) by mouth 2 (two) times daily as needed. 60 tablet 0   loperamide (IMODIUM) 2 MG capsule Take 1 capsule (2 mg total) by mouth daily as needed for diarrhea or loose stools. 30 capsule 0   losartan-hydrochlorothiazide (HYZAAR) 100-12.5 MG tablet Take 1 tablet by mouth daily. 30 tablet 0   mirtazapine (REMERON) 7.5 MG tablet Take 1 tablet (7.5 mg total) by mouth at bedtime. 30 tablet 0   naltrexone (DEPADE) 50 MG tablet Take 1 tablet (50 mg total) by mouth daily. 30 tablet 0   traZODone (DESYREL) 100 MG tablet Take 1 tablet (100 mg total) by mouth at bedtime as needed for sleep. 30 tablet 0    PTA Medications:  Facility Ordered Medications  Medication   [COMPLETED] sodium chloride 0.9 % bolus 1,000 mL   [COMPLETED] ondansetron (ZOFRAN) injection 4 mg   [COMPLETED] LORazepam (ATIVAN) tablet 1 mg   acetaminophen (TYLENOL) tablet 650 mg   alum & mag hydroxide-simeth (MAALOX/MYLANTA) 200-200-20 MG/5ML suspension 30 mL   magnesium hydroxide (MILK OF MAGNESIA) suspension 30 mL   [COMPLETED] thiamine (VITAMIN B1) injection 100 mg   multivitamin with minerals tablet 1 tablet   [EXPIRED] chlordiazePOXIDE (LIBRIUM) capsule 25 mg   [EXPIRED] hydrOXYzine (ATARAX) tablet 25 mg   [EXPIRED] loperamide (IMODIUM) capsule 2-4 mg   [EXPIRED] ondansetron (ZOFRAN-ODT) disintegrating tablet 4 mg   [COMPLETED] chlordiazePOXIDE (LIBRIUM) capsule 25 mg   Followed by   [COMPLETED] chlordiazePOXIDE (LIBRIUM) capsule 25 mg   Followed by   chlordiazePOXIDE (LIBRIUM)  capsule 25 mg   Followed by   Melene Muller ON 05/29/2023] chlordiazePOXIDE (LIBRIUM) capsule 25 mg   amLODipine (NORVASC) tablet 10 mg   traZODone (DESYREL) tablet 100 mg   mirtazapine (REMERON) tablet 7.5 mg   losartan (COZAAR) tablet 100 mg   And   hydrochlorothiazide (HYDRODIURIL) tablet 12.5 mg   busPIRone (BUSPAR) tablet 15 mg   hydrOXYzine (ATARAX) tablet 25 mg   PTA Medications  Medication Sig   amLODipine (NORVASC) 10 MG tablet Take 1 tablet (10 mg total) by mouth daily.   losartan-hydrochlorothiazide (HYZAAR) 100-12.5 MG tablet Take 1 tablet by mouth daily.   busPIRone (BUSPAR) 15 MG tablet Take 1 tablet (15 mg total) by mouth 2 (two) times daily.   hydrOXYzine (ATARAX) 25 MG tablet Take 1 tablet (25 mg total) by mouth 2 (two) times daily as needed.   mirtazapine (REMERON) 7.5 MG tablet Take 1 tablet (7.5 mg  total) by mouth at bedtime.   traZODone (DESYREL) 100 MG tablet Take 1 tablet (100 mg total) by mouth at bedtime as needed for sleep.   loperamide (IMODIUM) 2 MG capsule Take 1 capsule (2 mg total) by mouth daily as needed for diarrhea or loose stools.   naltrexone (DEPADE) 50 MG tablet Take 1 tablet (50 mg total) by mouth daily.       05/28/2023   10:53 AM 05/25/2023    5:15 PM 03/26/2023    9:01 AM  Depression screen PHQ 2/9  Decreased Interest 3 3 0  Down, Depressed, Hopeless 3 3 3   PHQ - 2 Score 6 6 3   Altered sleeping 0 1 0  Tired, decreased energy 0 2 0  Change in appetite 1 1 0  Feeling bad or failure about yourself  1 1 0  Trouble concentrating 1 2 0  Moving slowly or fidgety/restless 0 0 0  Suicidal thoughts 0 0 0  PHQ-9 Score 9 13 3   Difficult doing work/chores  Very difficult     Flowsheet Row ED from 05/25/2023 in [facility] Most recent reading at 05/25/2023  5:34 PM ED from 05/25/2023 in [facility] Most recent reading at 05/25/2023  4:27 PM ED from 05/25/2023 in [facility] Emergency Department at  [facility] Most recent reading at 05/25/2023 11:00 AM  C-SSRS RISK CATEGORY No Risk No Risk No Risk       Musculoskeletal  Strength & Muscle Tone: within normal limits Gait & Station: normal Patient leans: N/A  Psychiatric Specialty Exam  Presentation  General Appearance:  Disheveled  Eye Contact: Good  Speech: Clear and Coherent; Normal Rate  Speech Volume: Normal  Handedness: Right   Mood and Affect  Mood: Euthymic  Affect: Appropriate; Congruent   Thought Process  Thought Processes: Coherent; Goal Directed  Descriptions of Associations:Intact  Orientation:Full (Time, Place and Person)  Thought Content:WDL  Diagnosis of Schizophrenia or Schizoaffective disorder in past: No    Hallucinations:Hallucinations: None  Ideas of Reference:None  Suicidal Thoughts:Suicidal Thoughts: No  Homicidal Thoughts:Homicidal Thoughts: No   Sensorium  Memory: Immediate Fair; Recent Fair  Judgment: Poor  Insight: Fair; Shallow   Executive Functions  Concentration: Fair  Attention Span: Fair  Recall: [employer] of Knowledge: Fair  Language: Fair   Psychomotor Activity  Psychomotor Activity: Psychomotor Activity: Normal   Assets  Assets: Communication Skills; Desire for Improvement; Social Support; Health and safety inspector; Housing; Physical Health; Resilience   Sleep  Sleep: Sleep: Good   No data recorded  Physical Exam  Physical Exam Vitals reviewed.  Constitutional:      General: She is not in acute distress.    Appearance: She is not ill-appearing or diaphoretic.  HENT:     Head: Normocephalic and atraumatic.  Pulmonary:     Effort: Pulmonary effort is normal.  Skin:    General: Skin is warm.     Coloration: Skin is not jaundiced.  Neurological:     Mental Status: She is alert and oriented to person, place, and time.     Motor: No weakness.     Gait: Gait normal.      Review of Systems   Gastrointestinal:  Positive for diarrhea. Negative for nausea.       Chronic diarrhea  Neurological:  Negative for dizziness, tremors, seizures, weakness and headaches.  Blood pressure 135/83, pulse (!) 102, temperature 97.6 F (36.4 C), temperature source Oral, resp. rate 20, SpO2  100 %. There is no height or weight on file to calculate BMI.  Demographic Factors:  Age 76 or older, Divorced or widowed, Caucasian, Living alone, and Unemployed  Loss Factors: Loss of significant relationship and Decline in physical health  Historical Factors: Impulsivity  Risk Reduction Factors:   Sense of responsibility to family and Positive social support  Continued Clinical Symptoms:  Depression:   Comorbid alcohol abuse/dependence Hopelessness Alcohol/Substance Abuse/Dependencies Unstable or Poor Therapeutic Relationship Previous Psychiatric Diagnoses and Treatments  Cognitive Features That Contribute To Risk:  None    Suicide Risk:  Minimal: No identifiable suicidal ideation.  Patients presenting with no risk factors but with morbid ruminations; may be classified as minimal risk based on the severity of the depressive symptoms  Plan Of Care/Follow-up recommendations:  Follow-up recommendations:  Activity:  Normal, as tolerated Diet:  Per PCP recommendation  Patient is instructed prior to discharge to: Take all medications as prescribed by her mental healthcare provider. Report any adverse effects and/or reactions from the medicines to her outpatient provider promptly. Patient has been instructed & cautioned: To not engage in alcohol and or illegal drug use while on prescription medicines.  In the event of worsening symptoms, patient is instructed to call the crisis hotline at 988, 911 and or go to the nearest ED for appropriate evaluation and treatment of symptoms. To follow-up with her primary care provider for your other medical issues, concerns and or health care needs.    Disposition: [facility] 6/22 at 6:30 AM  Lamar Sprinkles, MD 05/28/2023, 6:15 PM

## 2023-05-28 NOTE — ED Provider Notes (Signed)
Behavioral Health Progress Note  Date and Time: 05/28/2023 8:17 AM Name: Stephanie Riley MRN:  [id number]  Subjective:  Stephanie Riley is a 72 y.o. female patient with a past psychiatric history of alcohol use disorder, MDD, GAD, benzodiazepine withdrawal with delirium, substance-induced mood disorder, insomnia, paranoia, adjustment disorder, and ADHD who presented voluntarily and unaccompanied to [facility] seeking alcohol detox, for which she was admitted to [facility].  On assessment today, patient reports feeling "ok," and she is minimal in offering responses as opposed to previous days. She reports sleeping well and intact appetite. She denies feeling dizzy or unsteady on her feet today. She denies acute concerns or complaints. She denies SI, HI, and AVH. She states that she does not want to go to the [facility] [facility] [employer] program as it is far, but motivational interviewing is provided and she is reminded that this is her only option if she wants to make a change in her life, and she voices understanding and agreement.    Collateral: Spoke to daughter, Stephanie Riley, with LCSW and MD Lucianne Muss. Daughter advised to be assertive in her direction, telling her mom that this is the plan moving forward. She plans to pick her mom up at 6:30 AM and transport to [place], [place] where a rep from [address] will meet with them to transport patient the remainder of the way. Encouragement is given to daughter, and she is appreciative and hopeful. Her questions were answered.    Diagnosis:  Final diagnoses:  Alcohol use disorder    Total Time spent with patient: 30 minutes  Past Psychiatric History:  OPT: Hx of Adderall, Zoloft, Trazodone, Wellbutrin, Xanax, Klonopin, no psychiatry opt 12/2022- INPT at [facility] , patient noted to have yellow/orange sclera on arrival, but cleared by discharge. Dx with Etoh use d/o,. MOCA was done due to cognitive concerns, scored 23/30. 2nd hosp in 2024- [facility], BZD withdrawal Dx: MDD, Etoh  use disorder, ADHD, anxiety, and chronic prescribed BZD use. Therapy: marriage counseling Past Medical History: HTN Family History: Both parents had Alzhmier's  Family Psychiatric  History: na Social History: -Living alone, separated from husband -Daughter in [place] - was a Runner, broadcasting/film/video - enjoyed working out at least 1 year ago  Additional Social History:                         Sleep: Good  Appetite:   Improving  Current Medications:  Current Facility-Administered Medications  Medication Dose Route Frequency Provider Last Rate Last Admin   acetaminophen (TYLENOL) tablet 650 mg  650 mg Oral Q6H PRN Sunday Corn, NP       alum & mag hydroxide-simeth (MAALOX/MYLANTA) 200-200-20 MG/5ML suspension 30 mL  30 mL Oral Q4H PRN Sunday Corn, NP       amLODipine (NORVASC) tablet 10 mg  10 mg Oral Daily Sunday Corn, NP   10 mg at 05/27/23 0918   busPIRone (BUSPAR) tablet 5 mg  5 mg Oral BID Sunday Corn, NP   5 mg at 05/27/23 2103   chlordiazePOXIDE (LIBRIUM) capsule 25 mg  25 mg Oral Q6H PRN Sunday Corn, NP       chlordiazePOXIDE (LIBRIUM) capsule 25 mg  25 mg Oral BH-qamhs Sunday Corn, NP       Followed by   Melene Muller ON 05/29/2023] chlordiazePOXIDE (LIBRIUM) capsule 25 mg  25 mg Oral Daily Sunday Corn, NP       losartan (COZAAR) tablet 100  mg  100 mg Oral Daily Nelly Rout, MD   100 mg at 05/27/23 2956   And   hydrochlorothiazide (HYDRODIURIL) tablet 12.5 mg  12.5 mg Oral Daily Nelly Rout, MD   12.5 mg at 05/27/23 2130   hydrOXYzine (ATARAX) tablet 25 mg  25 mg Oral Q6H PRN Sunday Corn, NP   25 mg at 05/27/23 1551   loperamide (IMODIUM) capsule 2-4 mg  2-4 mg Oral PRN Sunday Corn, NP   4 mg at 05/27/23 8657   magnesium hydroxide (MILK OF MAGNESIA) suspension 30 mL  30 mL Oral Daily PRN Sunday Corn, NP       mirtazapine (REMERON) tablet 7.5 mg  7.5 mg Oral QHS Sunday Corn, NP   7.5 mg at 05/27/23 2103   multivitamin with  minerals tablet 1 tablet  1 tablet Oral Daily Sunday Corn, NP   1 tablet at 05/27/23 0918   ondansetron (ZOFRAN-ODT) disintegrating tablet 4 mg  4 mg Oral Q6H PRN Sunday Corn, NP   4 mg at 05/27/23 2020   traZODone (DESYREL) tablet 100 mg  100 mg Oral QHS PRN Sunday Corn, NP   100 mg at 05/27/23 2103   Current Outpatient Medications  Medication Sig Dispense Refill   amLODipine (NORVASC) 10 MG tablet Take 1 tablet (10 mg total) by mouth daily for 7 days. 7 tablet 0   busPIRone (BUSPAR) 15 MG tablet Take 1 tablet (15 mg total) by mouth 2 (two) times daily for 7 days. 14 tablet 0   Cyanocobalamin (VITAMIN B-12 PO) Take 1 tablet by mouth daily.     hydrOXYzine (ATARAX) 25 MG tablet Take 1 tablet (25 mg total) by mouth 2 (two) times daily as needed for up to 14 days. 12 tablet 0   loperamide (IMODIUM) 2 MG capsule Take 2 mg by mouth daily as needed for diarrhea or loose stools.     losartan-hydrochlorothiazide (HYZAAR) 100-12.5 MG tablet Take 1 tablet by mouth daily for 7 days. 7 tablet 0   mirtazapine (REMERON) 7.5 MG tablet Take 1 tablet (7.5 mg total) by mouth at bedtime for 7 days. 7 tablet 0   naltrexone (DEPADE) 50 MG tablet Take 1 tablet (50 mg total) by mouth daily for 7 days. 7 tablet 0   ondansetron (ZOFRAN) 4 MG tablet Take 4 mg by mouth every 8 (eight) hours as needed for nausea or vomiting.     sertraline (ZOLOFT) 100 MG tablet Take 100 mg by mouth daily.     traZODone (DESYREL) 100 MG tablet Take 1 tablet (100 mg total) by mouth at bedtime for 7 days. (Patient taking differently: Take 150 mg by mouth at bedtime.) 7 tablet 0    Labs  Lab Results:  Admission on 05/25/2023  Component Date Value Ref Range Status   Hgb A1c MFr Bld 05/25/2023 4.9  4.8 - 5.6 % Final   Comment: (NOTE) Pre diabetes:          5.7%-6.4%  Diabetes:              >6.4%  Glycemic control for   <7.0% adults with diabetes    Mean Plasma Glucose 05/25/2023 93.93  mg/dL Final   Performed at  [facility], [address], [place], [place] [address]   Magnesium 05/25/2023 1.8  1.7 - 2.4 mg/dL Final   Performed at [facility], [address], [place], [place] [address]   Alcohol, Ethyl (B) 05/25/2023 <  10  <10 mg/dL Final   Comment: (NOTE) Lowest detectable limit for serum alcohol is 10 mg/dL.  For medical purposes only. Performed at [facility], [address], [place], [place] [address]    Cholesterol 05/25/2023 145  0 - 200 mg/dL Final   Triglycerides 56/38/7564 65  <150 mg/dL Final   HDL 33/29/5188 91  >40 mg/dL Final   Total CHOL/HDL Ratio 05/25/2023 1.6  RATIO Final   VLDL 05/25/2023 13  0 - 40 mg/dL Final   LDL Cholesterol 05/25/2023 41  0 - 99 mg/dL Final   Comment:        Total Cholesterol/HDL:CHD Risk Coronary Heart Disease Risk Table                     Men   Women  1/2 Average Risk   3.4   3.3  Average Risk       5.0   4.4  2 X Average Risk   9.6   7.1  3 X Average Risk  23.4   11.0        Use the calculated Patient Ratio above and the CHD Risk Table to determine the patient's CHD Risk.        ATP III CLASSIFICATION (LDL):  <100     mg/dL   Optimal  416-606  mg/dL   Near or Above                    Optimal  130-159  mg/dL   Borderline  301-601  mg/dL   High  >093     mg/dL   Very High Performed at [facility], [address] [place], [place] [address]    TSH 05/25/2023 1.830  0.350 - 4.500 uIU/mL Final   Comment: Performed by a 3rd Generation assay with a functional sensitivity of <=0.01 uIU/mL. Performed at [facility], [address] [place], [place] [address]    POC Amphetamine UR 05/26/2023 None Detected  NONE DETECTED (Cut Off Level 1000 ng/mL) Final   POC Secobarbital (BAR) 05/26/2023 None Detected  NONE DETECTED (Cut Off Level 300 ng/mL) Final   POC Buprenorphine (BUP) 05/26/2023 None Detected  NONE DETECTED (Cut Off Level 10 ng/mL) Final   POC Oxazepam (BZO) 05/26/2023 Positive (A)  NONE DETECTED (Cut Off  Level 300 ng/mL) Final   POC Cocaine UR 05/26/2023 None Detected  NONE DETECTED (Cut Off Level 300 ng/mL) Final   POC Methamphetamine UR 05/26/2023 None Detected  NONE DETECTED (Cut Off Level 1000 ng/mL) Final   POC Morphine 05/26/2023 None Detected  NONE DETECTED (Cut Off Level 300 ng/mL) Final   POC Methadone UR 05/26/2023 None Detected  NONE DETECTED (Cut Off Level 300 ng/mL) Final   POC Oxycodone UR 05/26/2023 Positive (A)  NONE DETECTED (Cut Off Level 100 ng/mL) Final   POC Marijuana UR 05/26/2023 None Detected  NONE DETECTED (Cut Off Level 50 ng/mL) Final  Admission on 05/25/2023, Discharged on 05/25/2023  Component Date Value Ref Range Status   WBC 05/25/2023 6.0  4.0 - 10.5 K/uL Final   RBC 05/25/2023 4.80  3.87 - 5.11 MIL/uL Final   Hemoglobin 05/25/2023 14.5  12.0 - 15.0 g/dL Final   HCT 54/27/0623 43.6  36.0 - 46.0 % Final   MCV 05/25/2023 90.8  80.0 - 100.0 fL Final   MCH 05/25/2023 30.2  26.0 - 34.0 pg Final   MCHC 05/25/2023 33.3  30.0 - 36.0 g/dL Final  RDW 05/25/2023 14.9  11.5 - 15.5 % Final   Platelets 05/25/2023 241  150 - 400 K/uL Final   nRBC 05/25/2023 0.0  0.0 - 0.2 % Final   Neutrophils Relative % 05/25/2023 76  % Final   Neutro Abs 05/25/2023 4.5  1.7 - 7.7 K/uL Final   Lymphocytes Relative 05/25/2023 16  % Final   Lymphs Abs 05/25/2023 1.0  0.7 - 4.0 K/uL Final   Monocytes Relative 05/25/2023 6  % Final   Monocytes Absolute 05/25/2023 0.3  0.1 - 1.0 K/uL Final   Eosinophils Relative 05/25/2023 0  % Final   Eosinophils Absolute 05/25/2023 0.0  0.0 - 0.5 K/uL Final   Basophils Relative 05/25/2023 1  % Final   Basophils Absolute 05/25/2023 0.1  0.0 - 0.1 K/uL Final   Immature Granulocytes 05/25/2023 1  % Final   Abs Immature Granulocytes 05/25/2023 0.05  0.00 - 0.07 K/uL Final   Performed at [facility], [address], [place], [place] [address]   Sodium 05/25/2023 138  135 - 145 mmol/L Final   Potassium 05/25/2023 3.7  3.5 - 5.1 mmol/L Final    Chloride 05/25/2023 96 (L)  98 - 111 mmol/L Final   CO2 05/25/2023 23  22 - 32 mmol/L Final   Glucose, Bld 05/25/2023 121 (H)  70 - 99 mg/dL Final   Glucose reference range applies only to samples taken after fasting for at least 8 hours.   BUN 05/25/2023 21  8 - 23 mg/dL Final   Creatinine, Ser 05/25/2023 0.63  0.44 - 1.00 mg/dL Final   Calcium 66/44/0347 8.9  8.9 - 10.3 mg/dL Final   Total Protein 42/59/5638 6.9  6.5 - 8.1 g/dL Final   Albumin 75/64/3329 4.4  3.5 - 5.0 g/dL Final   AST 51/88/4166 105 (H)  15 - 41 U/L Final   ALT 05/25/2023 62 (H)  0 - 44 U/L Final   Alkaline Phosphatase 05/25/2023 87  38 - 126 U/L Final   Total Bilirubin 05/25/2023 2.0 (H)  0.3 - 1.2 mg/dL Final   GFR, Estimated 05/25/2023 >60  >60 mL/min Final   Comment: (NOTE) Calculated using the CKD-EPI Creatinine Equation (2021)    Anion gap 05/25/2023 19 (H)  5 - 15 Final   Performed at [facility], [address] [place], [place] [address]   Lipase 05/25/2023 52 (H)  11 - 51 U/L Final   Performed at [facility], [address] [place], [place] [address]   Color, Urine 05/25/2023 YELLOW  YELLOW Final   APPearance 05/25/2023 CLEAR  CLEAR Final   Specific Gravity, Urine 05/25/2023 1.025  1.005 - 1.030 Final   pH 05/25/2023 5.0  5.0 - 8.0 Final   Glucose, UA 05/25/2023 NEGATIVE  NEGATIVE mg/dL Final   Hgb urine dipstick 05/25/2023 NEGATIVE  NEGATIVE Final   Bilirubin Urine 05/25/2023 NEGATIVE  NEGATIVE Final   Ketones, ur 05/25/2023 NEGATIVE  NEGATIVE mg/dL Final   Protein, ur 32/35/5732 NEGATIVE  NEGATIVE mg/dL Final   Nitrite 20/25/4270 NEGATIVE  NEGATIVE Final   Leukocytes,Ua 05/25/2023 NEGATIVE  NEGATIVE Final   Performed at [facility], [address], [place], [place] [address]   Troponin I (High Sensitivity) 05/25/2023 13  <18 ng/L Final   Comment: (NOTE) Elevated high sensitivity troponin I (hsTnI) values and significant  changes across serial measurements may suggest ACS but  many other  chronic and acute conditions are known to elevate hsTnI results.  Refer to the "Links" section for chest pain algorithms and additional  guidance. Performed at [facility], [address] [place], [place] [address]    pH, Ven 05/25/2023 7.406  7.25 - 7.43 Final   pCO2, Ven 05/25/2023 39.8 (L)  44 - 60 mmHg Final   pO2, Ven 05/25/2023 64 (H)  32 - 45 mmHg Final   Bicarbonate 05/25/2023 25.0  20.0 - 28.0 mmol/L Final   TCO2 05/25/2023 26  22 - 32 mmol/L Final   O2 Saturation 05/25/2023 92  % Final   Acid-Base Excess 05/25/2023 0.0  0.0 - 2.0 mmol/L Final   Sodium 05/25/2023 134 (L)  135 - 145 mmol/L Final   Potassium 05/25/2023 3.7  3.5 - 5.1 mmol/L Final   Calcium, Ion 05/25/2023 1.01 (L)  1.15 - 1.40 mmol/L Final   HCT 05/25/2023 44.0  36.0 - 46.0 % Final   Hemoglobin 05/25/2023 15.0  12.0 - 15.0 g/dL Final   Sample type 60/45/4098 VENOUS   Final   Troponin I (High Sensitivity) 05/25/2023 11  <18 ng/L Final   Comment: (NOTE) Elevated high sensitivity troponin I (hsTnI) values and significant  changes across serial measurements may suggest ACS but many other  chronic and acute conditions are known to elevate hsTnI results.  Refer to the "Links" section for chest pain algorithms and additional  guidance. Performed at [facility], [address], [place], [place] [address]   Admission on 03/19/2023, Discharged on 03/26/2023  Component Date Value Ref Range Status   SARS Coronavirus 2 by RT PCR 03/19/2023 NEGATIVE  NEGATIVE Final   Performed at [facility], [address] [place], [place] [address]   WBC 03/19/2023 9.3  4.0 - 10.5 K/uL Final   RBC 03/19/2023 3.87  3.87 - 5.11 MIL/uL Final   Hemoglobin 03/19/2023 11.8 (L)  12.0 - 15.0 g/dL Final   HCT 62/13/0865 35.3 (L)  36.0 - 46.0 % Final   MCV 03/19/2023 91.2  80.0 - 100.0 fL Final   MCH 03/19/2023 30.5  26.0 - 34.0 pg Final   MCHC 03/19/2023 33.4  30.0 - 36.0 g/dL Final   RDW 78/46/9629 15.4   11.5 - 15.5 % Final   Platelets 03/19/2023 181  150 - 400 K/uL Final   nRBC 03/19/2023 0.0  0.0 - 0.2 % Final   Neutrophils Relative % 03/19/2023 73  % Final   Neutro Abs 03/19/2023 6.8  1.7 - 7.7 K/uL Final   Lymphocytes Relative 03/19/2023 15  % Final   Lymphs Abs 03/19/2023 1.4  0.7 - 4.0 K/uL Final   Monocytes Relative 03/19/2023 10  % Final   Monocytes Absolute 03/19/2023 0.9  0.1 - 1.0 K/uL Final   Eosinophils Relative 03/19/2023 0  % Final   Eosinophils Absolute 03/19/2023 0.0  0.0 - 0.5 K/uL Final   Basophils Relative 03/19/2023 1  % Final   Basophils Absolute 03/19/2023 0.1  0.0 - 0.1 K/uL Final   Immature Granulocytes 03/19/2023 1  % Final   Abs Immature Granulocytes 03/19/2023 0.06  0.00 - 0.07 K/uL Final   Performed at [facility], [address] [place], [place] [address]   Sodium 03/19/2023 132 (L)  135 - 145 mmol/L Final   Potassium 03/19/2023 2.8 (L)  3.5 - 5.1 mmol/L Final   Chloride 03/19/2023 95 (L)  98 - 111 mmol/L Final   CO2 03/19/2023 24  22 - 32 mmol/L Final   Glucose, Bld 03/19/2023 97  70 - 99 mg/dL Final   Glucose reference range applies only to samples taken after fasting for at least 8 hours.   BUN 03/19/2023 21  8 - 23 mg/dL Final   Creatinine, Ser 03/19/2023 0.95  0.44 - 1.00 mg/dL Final   Calcium 96/29/5284 9.0  8.9 - 10.3 mg/dL Final   Total Protein 13/24/4010 7.1  6.5 - 8.1 g/dL Final   Albumin 27/25/3664 4.5  3.5 - 5.0 g/dL Final   AST 40/34/7425 37  15 - 41 U/L Final   ALT 03/19/2023 29  0 - 44 U/L Final   Alkaline Phosphatase 03/19/2023 81  38 - 126 U/L Final   Total Bilirubin 03/19/2023 2.9 (H)  0.3 - 1.2 mg/dL Final   GFR, Estimated 03/19/2023 >60  >60 mL/min Final   Comment: (NOTE) Calculated using the CKD-EPI Creatinine Equation (2021)    Anion gap 03/19/2023 13  5 - 15 Final   Performed at [facility], [address] [place], [place] [address]   Alcohol, Ethyl (B) 03/19/2023 <10  <10 mg/dL Final   Comment: (NOTE) Lowest  detectable limit for serum alcohol is 10 mg/dL.  For medical purposes only. Performed at [facility], [address] [place], [place] [address]    POC Amphetamine UR 03/21/2023 None Detected  NONE DETECTED (Cut Off Level 1000 ng/mL) Final   POC Secobarbital (BAR) 03/21/2023 None Detected  NONE DETECTED (Cut Off Level 300 ng/mL) Final   POC Buprenorphine (BUP) 03/21/2023 None Detected  NONE DETECTED (Cut Off Level 10 ng/mL) Final   POC Oxazepam (BZO) 03/21/2023 Positive (A)  NONE DETECTED (Cut Off Level 300 ng/mL) Final   POC Cocaine UR 03/21/2023 None Detected  NONE DETECTED (Cut Off Level 300 ng/mL) Final   POC Methamphetamine UR 03/21/2023 None Detected  NONE DETECTED (Cut Off Level 1000 ng/mL) Final   POC Morphine 03/21/2023 None Detected  NONE DETECTED (Cut Off Level 300 ng/mL) Final   POC Methadone UR 03/21/2023 None Detected  NONE DETECTED (Cut Off Level 300 ng/mL) Final   POC Oxycodone UR 03/21/2023 None Detected  NONE DETECTED (Cut Off Level 100 ng/mL) Final   POC Marijuana UR 03/21/2023 None Detected  NONE DETECTED (Cut Off Level 50 ng/mL) Final   Color, Urine 03/19/2023 AMBER (A)  YELLOW Final   BIOCHEMICALS MAY BE AFFECTED BY COLOR   APPearance 03/19/2023 CLOUDY (A)  CLEAR Final   Specific Gravity, Urine 03/19/2023 1.025  1.005 - 1.030 Final   pH 03/19/2023 5.0  5.0 - 8.0 Final   Glucose, UA 03/19/2023 NEGATIVE  NEGATIVE mg/dL Final   Hgb urine dipstick 03/19/2023 NEGATIVE  NEGATIVE Final   Bilirubin Urine 03/19/2023 MODERATE (A)  NEGATIVE Final   Ketones, ur 03/19/2023 5 (A)  NEGATIVE mg/dL Final   Protein, ur 32/95/1884 100 (A)  NEGATIVE mg/dL Final   Nitrite 16/60/6301 NEGATIVE  NEGATIVE Final   Leukocytes,Ua 03/19/2023 MODERATE (A)  NEGATIVE Final   RBC / HPF 03/19/2023 0-5  0 - 5 RBC/hpf Final   WBC, UA 03/19/2023 21-50  0 - 5 WBC/hpf Final   Bacteria, UA 03/19/2023 MANY (A)  NONE SEEN Final   Squamous Epithelial / HPF 03/19/2023 6-10  0 - 5 /HPF Final   Mucus  03/19/2023 PRESENT   Final   Hyaline Casts, UA 03/19/2023 PRESENT   Final   Non Squamous Epithelial 03/19/2023 0-5 (A)  NONE SEEN Final   Performed at [facility], [address] [place], [place]  [address]   Magnesium 03/19/2023 2.2  1.7 - 2.4 mg/dL Final   Performed at [facility], [address], [place], [place] [address]   Vitamin B-12 03/19/2023 398  180 - 914 pg/mL Final   Comment: (NOTE) This assay is not validated for testing neonatal or myeloproliferative syndrome specimens for Vitamin B12 levels. Performed at [facility], [address] [place], [place] [address]    SARSCOV2ONAVIRUS 2 AG 03/19/2023 NEGATIVE  NEGATIVE Final   Comment: (NOTE) SARS-CoV-2 antigen NOT DETECTED.   Negative results are presumptive.  Negative results do not preclude SARS-CoV-2 infection and should not be used as the sole basis for treatment or other patient management decisions, including infection  control decisions, particularly in the presence of clinical signs and  symptoms consistent with COVID-19, or in those who have been in contact with the virus.  Negative results must be combined with clinical observations, patient history, and epidemiological information. The expected result is Negative.  Fact Sheet for Patients: https://www.jennings-kim.com/  Fact Sheet for Healthcare Providers: https://alexander-rogers.biz/  This test is not yet approved or cleared by the [place] FDA and  has been authorized for detection and/or diagnosis of SARS-CoV-2 by FDA under an Emergency Use Authorization (EUA).  This EUA will remain in effect (meaning this test can be used) for the duration of  the COV                          ID-19 declaration under Section 564(b)(1) of the Act, 21 U.S.C. section 360bbb-3(b)(1), unless the authorization is terminated or revoked sooner.     Sodium 03/22/2023 135  135 - 145 mmol/L Final   Potassium 03/22/2023 3.6  3.5  - 5.1 mmol/L Final   Chloride 03/22/2023 99  98 - 111 mmol/L Final   CO2 03/22/2023 27  22 - 32 mmol/L Final   Glucose, Bld 03/22/2023 97  70 - 99 mg/dL Final   Glucose reference range applies only to samples taken after fasting for at least 8 hours.   BUN 03/22/2023 22  8 - 23 mg/dL Final   Creatinine, Ser 03/22/2023 0.72  0.44 - 1.00 mg/dL Final   Calcium 69/62/9528 9.4  8.9 - 10.3 mg/dL Final   Total Protein 41/32/4401 6.9  6.5 - 8.1 g/dL Final   Albumin 02/72/5366 3.8  3.5 - 5.0 g/dL Final   AST 44/02/4741 16  15 - 41 U/L Final   ALT 03/22/2023 18  0 - 44 U/L Final   Alkaline Phosphatase 03/22/2023 63  38 - 126 U/L Final   Total Bilirubin 03/22/2023 0.7  0.3 - 1.2 mg/dL Final   GFR, Estimated 03/22/2023 >60  >60 mL/min Final   Comment: (NOTE) Calculated using the CKD-EPI Creatinine Equation (2021)    Anion gap 03/22/2023 9  5 - 15 Final   Performed at [facility], [address], [place], [place] [address]  Admission on 02/11/2023, Discharged on 02/17/2023  Component Date Value Ref Range Status   Alcohol, Ethyl (B) 02/11/2023 72 (H)  <10 mg/dL Final   Comment: (NOTE) Lowest detectable limit for serum alcohol is 10 mg/dL.  For medical purposes only. Performed at [facility], [address], [place], [place] [address]    Sodium 02/11/2023 136  135 - 145 mmol/L Final   Potassium 02/11/2023 3.2 (L)  3.5 - 5.1 mmol/L Final   Chloride 02/11/2023 97 (L)  98 - 111 mmol/L Final  CO2 02/11/2023 24  22 - 32 mmol/L Final   Glucose, Bld 02/11/2023 97  70 - 99 mg/dL Final   Glucose reference range applies only to samples taken after fasting for at least 8 hours.   BUN 02/11/2023 7 (L)  8 - 23 mg/dL Final   Creatinine, Ser 02/11/2023 0.47  0.44 - 1.00 mg/dL Final   Calcium 16/09/9603 9.2  8.9 - 10.3 mg/dL Final   Total Protein 54/08/8118 7.7  6.5 - 8.1 g/dL Final   Albumin 14/78/2956 4.7  3.5 - 5.0 g/dL Final   AST 21/30/8657 35  15 - 41 U/L Final   ALT 02/11/2023 34  0  - 44 U/L Final   Alkaline Phosphatase 02/11/2023 91  38 - 126 U/L Final   Total Bilirubin 02/11/2023 1.7 (H)  0.3 - 1.2 mg/dL Final   GFR, Estimated 02/11/2023 >60  >60 mL/min Final   Comment: (NOTE) Calculated using the CKD-EPI Creatinine Equation (2021)    Anion gap 02/11/2023 15  5 - 15 Final   Performed at [facility], [address], [place], [place] [address]   WBC 02/11/2023 9.2  4.0 - 10.5 K/uL Final   RBC 02/11/2023 4.23  3.87 - 5.11 MIL/uL Final   Hemoglobin 02/11/2023 13.4  12.0 - 15.0 g/dL Final   HCT 29/52/8413 38.8  36.0 - 46.0 % Final   MCV 02/11/2023 91.7  80.0 - 100.0 fL Final   MCH 02/11/2023 31.7  26.0 - 34.0 pg Final   MCHC 02/11/2023 34.5  30.0 - 36.0 g/dL Final   RDW 24/40/1027 13.0  11.5 - 15.5 % Final   Platelets 02/11/2023 422 (H)  150 - 400 K/uL Final   nRBC 02/11/2023 0.0  0.0 - 0.2 % Final   Performed at [facility], [address], [place], [place] [address]   Magnesium 02/11/2023 2.0  1.7 - 2.4 mg/dL Final   Performed at [facility], [address], [place], [place] [address]   Phosphorus 02/11/2023 3.7  2.5 - 4.6 mg/dL Final   Performed at [facility], [address], [place], [place] [address]   Vitamin B-12 02/11/2023 252  180 - 914 pg/mL Final   Comment: (NOTE) This assay is not validated for testing neonatal or myeloproliferative syndrome specimens for Vitamin B12 levels. Performed at [facility], [address] [place], [place] [address]    Folate 02/11/2023 6.3  >5.9 ng/mL Final   Performed at [facility], [address] [place], [place] [address]   TSH 02/11/2023 3.115  0.350 - 4.500 uIU/mL Final   Comment: Performed by a 3rd Generation assay with a functional sensitivity of <=0.01 uIU/mL. Performed at [facility], [address], [place], [place] [address]    SARS Coronavirus 2 by RT PCR 02/11/2023 NEGATIVE  NEGATIVE Final   Influenza A by PCR 02/11/2023 NEGATIVE  NEGATIVE Final    Influenza B by PCR 02/11/2023 NEGATIVE  NEGATIVE Final   Comment: (NOTE) The Xpert Xpress SARS-CoV-2/FLU/RSV plus assay is intended as an aid in the diagnosis of influenza from Nasopharyngeal swab specimens and should not be used as a sole basis for treatment. Nasal washings and aspirates are unacceptable for Xpert Xpress SARS-CoV-2/FLU/RSV testing.  Fact Sheet for Patients: BloggerCourse.com  Fact Sheet for Healthcare Providers: SeriousBroker.it  This test is not yet approved or cleared by the [place] FDA and has been authorized for detection and/or diagnosis of SARS-CoV-2  by FDA under an Emergency Use Authorization (EUA). This EUA will remain in effect (meaning this test can be used) for the duration of the COVID-19 declaration under Section 564(b)(1) of the Act, 21 U.S.C. section 360bbb-3(b)(1), unless the authorization is terminated or revoked.     Resp Syncytial Virus by PCR 02/11/2023 NEGATIVE  NEGATIVE Final   Comment: (NOTE) Fact Sheet for Patients: BloggerCourse.com  Fact Sheet for Healthcare Providers: SeriousBroker.it  This test is not yet approved or cleared by the [place] FDA and has been authorized for detection and/or diagnosis of SARS-CoV-2 by FDA under an Emergency Use Authorization (EUA). This EUA will remain in effect (meaning this test can be used) for the duration of the COVID-19 declaration under Section 564(b)(1) of the Act, 21 U.S.C. section 360bbb-3(b)(1), unless the authorization is terminated or revoked.  Performed at [facility], [address], [place], [place] [address]    Potassium 02/14/2023 4.2  3.5 - 5.1 mmol/L Final   Performed at [facility], [address] [place], [place] [address]    Blood Alcohol level:  Lab Results  Component Value Date   ETH <10 05/25/2023   ETH <10 03/19/2023    Metabolic Disorder  Labs: Lab Results  Component Value Date   HGBA1C 4.9 05/25/2023   MPG 93.93 05/25/2023   No results found for: "PROLACTIN" Lab Results  Component Value Date   CHOL 145 05/25/2023   TRIG 65 05/25/2023   HDL 91 05/25/2023   CHOLHDL 1.6 05/25/2023   VLDL 13 05/25/2023   LDLCALC 41 05/25/2023    Therapeutic Lab Levels: No results found for: "LITHIUM" No results found for: "VALPROATE" No results found for: "CBMZ"  Physical Findings   AUDIT    Flowsheet Row ED from 05/25/2023 in [facility] Emergency Department at [facility]  Alcohol Use Disorder Identification Test Final Score (AUDIT) 27      PHQ2-9    Flowsheet Row ED from 05/25/2023 in [facility] ED from 03/19/2023 in [facility] ED from 02/11/2023 in [place]  PHQ-2 Total Score 6 3 1   PHQ-9 Total Score 13 3 4       Flowsheet Row ED from 05/25/2023 in [facility] Most recent reading at 05/25/2023  5:34 PM ED from 05/25/2023 in [facility] Most recent reading at 05/25/2023  4:27 PM ED from 05/25/2023 in [facility] Emergency Department at [facility] Most recent reading at 05/25/2023 11:00 AM  C-SSRS RISK CATEGORY No Risk No Risk No Risk        Musculoskeletal  Strength & Muscle Tone: decreased Gait & Station: normal, but slowed Patient leans: N/A  Psychiatric Specialty Exam  Presentation  General Appearance:  Disheveled  Eye Contact: Good  Speech: Clear and Coherent; Normal Rate  Speech Volume: Normal  Handedness: Right   Mood and Affect  Mood: Euthymic  Affect: Appropriate; Congruent   Thought Process  Thought Processes: Coherent; Goal Directed  Descriptions of Associations:Intact  Orientation:Full (Time, Place and Person)  Thought Content:WDL  Diagnosis of Schizophrenia or Schizoaffective disorder in past: No     Hallucinations:Hallucinations: None   Ideas of Reference:None  Suicidal Thoughts:Suicidal Thoughts: No   Homicidal Thoughts:Homicidal Thoughts: No    Sensorium  Memory: Immediate Fair; Recent Fair  Judgment: Poor  Insight: Fair; Shallow   Executive Functions  Concentration: Fair  Attention Span: Fair  Recall: [employer]  of Knowledge: Fair  Language: Fair   Lexicographer Activity: Psychomotor Activity: Normal    Assets  Assets: Communication Skills; Desire for Improvement; Social Support; Health and safety inspector; Housing; Physical Health; Resilience   Sleep  Sleep: Sleep: Good    No data recorded   Physical Exam  Physical Exam Vitals reviewed.  Constitutional:      General: She is not in acute distress.    Appearance: She is not ill-appearing or diaphoretic.  HENT:     Head: Normocephalic and atraumatic.  Pulmonary:     Effort: Pulmonary effort is normal.  Skin:    General: Skin is warm.     Coloration: Skin is not jaundiced.  Neurological:     Mental Status: She is alert and oriented to person, place, and time.     Motor: No weakness.     Gait: Gait normal.    Review of Systems  Gastrointestinal:  Positive for diarrhea. Negative for nausea.       Chronic diarrhea  Neurological:  Negative for dizziness, tremors, seizures, weakness and headaches.   Blood pressure 134/75, pulse 97, temperature 98.2 F (36.8 C), temperature source Tympanic, resp. rate 16, SpO2 96 %. There is no height or weight on file to calculate BMI.  Treatment Plan Summary: Daily contact with patient to assess and evaluate symptoms and progress in treatment and Medication management  Stephanie Riley is a 72 y.o. female patient who presented voluntarily [facility] seeking alcohol detox, for which she was admitted to [facility]. Patient has been admitted to [facility] multiple times, and now only has [facility] in [place] as an option for treatment; [facility] has not yet returned communication with LCSW. LCSW assisting with dispo planning for patient and to reach out to daughter, but if patient is not agreeable to [facility], patient may be without treatment options and would subsequently have to discharge home.   #Alcohol Use Disorder CIWA Librium protocol initiated: - chlordiazepoxide (Librium) 25 mg 4 times daily x4 doses (completed), 25 mg 3 times daily x3 doses (completed) ,25 mg 2 times daily x2 doses, 25 mg daily x1 dose -chlordiazepoxide (LIbrium) 25 mg every 6 hours as needed for CIWA greater than 10; Hydroxyzine 25 mg for CIWA less than 10 -Multivitamin with minerals 1 tablet daily -Ondansetron disintegrating tablet 4 mg every 6 as needed/nausea or vomiting -Loperamide 2 to 4 mg oral as needed/diarrhea or loose stools -Thiamine injection 100 mg IM once followed by PO daily -- Hold home Naltrexone while detoxing; to be resumed upon discharge   #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  Lamar Sprinkles, MD 05/28/2023 8:17 AM

## 2023-05-28 NOTE — Group Note (Unsigned)
Group Topic: Balance in Life  Group Date: 05/28/2023 Start Time: 0730 End Time: 0910 Facilitators: Rae Lips B  Department: [facility]  Number of Participants: 6  Group Focus: acceptance Treatment Modality:  Exposure Therapy Interventions utilized were leisure development Purpose: increase insight  Name: Stephanie Riley Date of Birth: 1951/06/01  MR: [id number]    Level of Participation: active Quality of Participation: attentive Interactions with others: gave feedback Mood/Affect: appropriate Triggers (if applicable): NA Cognition: coherent/clear Progress: Gaining insight Response: NA Plan: patient will be encouraged to keep going to groups.  Patients Problems:  Patient Active Problem List   Diagnosis Date Noted   Alcohol use disorder 03/19/2023   Alcohol use disorder, moderate, dependence (HCC) 02/12/2023   Benzodiazepine withdrawal without complication (HCC) 02/12/2023

## 2023-05-29 DIAGNOSIS — F1090 Alcohol use, unspecified, uncomplicated: Secondary | ICD-10-CM

## 2023-05-29 NOTE — ED Notes (Signed)
Pt is in the bed sleeping. Respirations are even and unlabored. No acute distress noted. Will continue to monitor for safety. 

## 2023-06-23 ENCOUNTER — Telehealth (HOSPITAL_COMMUNITY): Payer: Self-pay | Admitting: Licensed Clinical Social Worker

## 2023-06-23 NOTE — Telephone Encounter (Signed)
The therapist returns Stephanie Riley's call; however, she is presently unable to talk as she is at a rehab in [place] and sounds to be arguing with her husband and someone else in the background about being tired of the "collusion." She says that she is looking forward to setting up IOP noting that the program where she is now is "so boring." Thus, it appears she is in the process of leaving the facility AMA and setting up her own aftercare.  She informs the therapist that she will call him back tomorrow.  [name], MA, LCSW, [facility], LCAS 06/23/2023

## 2023-07-09 ENCOUNTER — Encounter (HOSPITAL_COMMUNITY): Payer: Medicare PPO | Admitting: Student in an Organized Health Care Education/Training Program

## 2023-07-13 ENCOUNTER — Other Ambulatory Visit (HOSPITAL_COMMUNITY): Payer: Self-pay | Admitting: Student in an Organized Health Care Education/Training Program

## 2023-07-13 ENCOUNTER — Telehealth (HOSPITAL_COMMUNITY): Payer: Self-pay | Admitting: Student in an Organized Health Care Education/Training Program

## 2023-07-13 DIAGNOSIS — F411 Generalized anxiety disorder: Secondary | ICD-10-CM

## 2023-07-13 MED ORDER — SERTRALINE HCL 100 MG PO TABS
100.0000 mg | ORAL_TABLET | Freq: Every day | ORAL | 1 refills | Status: DC
Start: 2023-07-13 — End: 2023-09-17

## 2023-07-13 MED ORDER — MIRTAZAPINE 7.5 MG PO TABS: 7.5000 mg | ORAL_TABLET | Freq: Every day | ORAL | 1 refills | Status: DC

## 2023-07-13 MED ORDER — TRAZODONE HCL 100 MG PO TABS
100.0000 mg | ORAL_TABLET | Freq: Every evening | ORAL | 1 refills | Status: DC | PRN
Start: 2023-07-13 — End: 2023-10-22

## 2023-07-13 NOTE — Telephone Encounter (Signed)
Spoke with patient, based on Med rec patient has been taking the equivalent of 200mg  and patient reports that she has been  doing this because it was prescribed this way. Unable to confirm this with EMR  given that the meds were prescribed after leaving [facility] in [place], but patient has recent labs that are not concerning for hyponatremia and has severe anxiety; therefore will refill at this dose, especially given she continues to endorse anxiety and cravings for Etoh. Patient endorsed continued sobriety, but that she was struggling due to anxiety. Patient endorsed being interested in [facility], and that she had received a certificate from [facility] for completion, and that she needs a "purpose" and to learn how to cope with her anxiety. Patient reports that she also found the remeron and hydroxyzine helpful, will provide refills as well. Have reached out to [facility] about getting her in, since her next OPT appt is not until 08/2023.

## 2023-07-15 ENCOUNTER — Telehealth (HOSPITAL_COMMUNITY): Payer: Self-pay

## 2023-08-06 ENCOUNTER — Telehealth (HOSPITAL_COMMUNITY): Payer: Self-pay

## 2023-08-06 NOTE — Telephone Encounter (Signed)
Medication refill - Fax from pt's [facility] requesting a new 90 day order for her prescribed Mirtazapine 7.5 mg, last provided for 30 day supply and 1 refill on 07/13/23. Pt returns next on 08/27/23.

## 2023-08-11 ENCOUNTER — Other Ambulatory Visit (HOSPITAL_COMMUNITY): Payer: Self-pay | Admitting: Student in an Organized Health Care Education/Training Program

## 2023-08-11 DIAGNOSIS — F411 Generalized anxiety disorder: Secondary | ICD-10-CM

## 2023-08-11 MED ORDER — MIRTAZAPINE 7.5 MG PO TABS: 7.5000 mg | ORAL_TABLET | Freq: Every day | ORAL | 0 refills | Status: DC

## 2023-08-11 NOTE — Telephone Encounter (Signed)
Did do 90 day supply, as this appears to be an insurance request.  Thanks

## 2023-08-27 ENCOUNTER — Encounter (HOSPITAL_COMMUNITY): Payer: Medicare PPO | Admitting: Student in an Organized Health Care Education/Training Program

## 2023-09-03 ENCOUNTER — Emergency Department (HOSPITAL_COMMUNITY): Payer: Medicare PPO

## 2023-09-03 ENCOUNTER — Inpatient Hospital Stay (HOSPITAL_COMMUNITY)
Admission: EM | Admit: 2023-09-03 | Discharge: 2023-09-09 | DRG: 896 | Disposition: A | Discharge status: Home Health | Payer: Medicare PPO | Attending: Internal Medicine | Admitting: Internal Medicine

## 2023-09-03 ENCOUNTER — Other Ambulatory Visit: Payer: Self-pay

## 2023-09-03 ENCOUNTER — Encounter (HOSPITAL_COMMUNITY): Payer: Self-pay

## 2023-09-03 DIAGNOSIS — R7401 Elevation of levels of liver transaminase levels: Secondary | ICD-10-CM

## 2023-09-03 DIAGNOSIS — E872 Acidosis, unspecified: Secondary | ICD-10-CM

## 2023-09-03 DIAGNOSIS — F1090 Alcohol use, unspecified, uncomplicated: Secondary | ICD-10-CM

## 2023-09-03 DIAGNOSIS — E876 Hypokalemia: Secondary | ICD-10-CM | POA: Diagnosis present

## 2023-09-03 DIAGNOSIS — E860 Dehydration: Secondary | ICD-10-CM | POA: Diagnosis not present

## 2023-09-03 DIAGNOSIS — I10 Essential (primary) hypertension: Secondary | ICD-10-CM | POA: Diagnosis present

## 2023-09-03 DIAGNOSIS — R748 Abnormal levels of other serum enzymes: Secondary | ICD-10-CM | POA: Diagnosis present

## 2023-09-03 DIAGNOSIS — F10129 Alcohol abuse with intoxication, unspecified: Secondary | ICD-10-CM | POA: Diagnosis not present

## 2023-09-03 DIAGNOSIS — E8729 Other acidosis: Secondary | ICD-10-CM

## 2023-09-03 DIAGNOSIS — R6510 Systemic inflammatory response syndrome (SIRS) of non-infectious origin without acute organ dysfunction: Secondary | ICD-10-CM | POA: Diagnosis present

## 2023-09-03 DIAGNOSIS — W19XXXA Unspecified fall, initial encounter: Secondary | ICD-10-CM

## 2023-09-03 DIAGNOSIS — T17908A Unspecified foreign body in respiratory tract, part unspecified causing other injury, initial encounter: Secondary | ICD-10-CM

## 2023-09-03 DIAGNOSIS — Z79899 Other long term (current) drug therapy: Secondary | ICD-10-CM

## 2023-09-03 DIAGNOSIS — U071 COVID-19: Secondary | ICD-10-CM

## 2023-09-03 DIAGNOSIS — Y907 Blood alcohol level of 200-239 mg/100 ml: Secondary | ICD-10-CM | POA: Diagnosis present

## 2023-09-03 DIAGNOSIS — D649 Anemia, unspecified: Secondary | ICD-10-CM | POA: Diagnosis present

## 2023-09-03 DIAGNOSIS — F10929 Alcohol use, unspecified with intoxication, unspecified: Secondary | ICD-10-CM

## 2023-09-03 DIAGNOSIS — Z789 Other specified health status: Principal | ICD-10-CM

## 2023-09-03 LAB — COMPREHENSIVE METABOLIC PANEL
ALT: 81 U/L — ABNORMAL HIGH (ref 0–44)
AST: 185 U/L — ABNORMAL HIGH (ref 15–41)
Albumin: 3.9 g/dL (ref 3.5–5.0)
Alkaline Phosphatase: 77 U/L (ref 38–126)
Anion gap: 28 — ABNORMAL HIGH (ref 5–15)
BUN: 10 mg/dL (ref 8–23)
CO2: 8 mmol/L — ABNORMAL LOW (ref 22–32)
Calcium: 7.6 mg/dL — ABNORMAL LOW (ref 8.9–10.3)
Chloride: 98 mmol/L (ref 98–111)
Creatinine, Ser: 0.58 mg/dL (ref 0.44–1.00)
GFR, Estimated: >60 mL/min (ref >60)
Glucose, Bld: 80 mg/dL (ref 70–99)
Potassium: 3 mmol/L — ABNORMAL LOW (ref 3.5–5.1)
Sodium: 134 mmol/L — ABNORMAL LOW (ref 135–145)
Total Bilirubin: 1.8 mg/dL — ABNORMAL HIGH (ref 0.3–1.2)
Total Protein: 7.1 g/dL (ref 6.5–8.1)

## 2023-09-03 LAB — CBG MONITORING, ED: Glucose-Capillary: 81 mg/dL (ref 70–99)

## 2023-09-03 LAB — CBC WITH DIFFERENTIAL/PLATELET
Abs Immature Granulocytes: 0.21 10*3/uL — ABNORMAL HIGH (ref 0.00–0.07)
Basophils Absolute: 0.1 10*3/uL (ref 0.0–0.1)
Basophils Relative: 0 %
Eosinophils Absolute: 0 10*3/uL (ref 0.0–0.5)
Eosinophils Relative: 0 %
HCT: 36.3 % (ref 36.0–46.0)
Hemoglobin: 11.6 g/dL — ABNORMAL LOW (ref 12.0–15.0)
Immature Granulocytes: 2 %
Lymphocytes Relative: 6 %
Lymphs Abs: 0.8 10*3/uL (ref 0.7–4.0)
MCH: 30.8 pg (ref 26.0–34.0)
MCHC: 32 g/dL (ref 30.0–36.0)
MCV: 96.3 fL (ref 80.0–100.0)
Monocytes Absolute: 1 10*3/uL (ref 0.1–1.0)
Monocytes Relative: 7 %
Neutro Abs: 12 10*3/uL — ABNORMAL HIGH (ref 1.7–7.7)
Neutrophils Relative %: 85 %
Platelets: 225 10*3/uL (ref 150–400)
RBC: 3.77 MIL/uL — ABNORMAL LOW (ref 3.87–5.11)
RDW: 19.2 % — ABNORMAL HIGH (ref 11.5–15.5)
WBC: 14 10*3/uL — ABNORMAL HIGH (ref 4.0–10.5)
nRBC: 0 % (ref 0.0–0.2)

## 2023-09-03 LAB — CK: Total CK: 575 U/L — ABNORMAL HIGH (ref 38–234)

## 2023-09-03 LAB — ETHANOL: Alcohol, Ethyl (B): 215 mg/dL — ABNORMAL HIGH (ref <10)

## 2023-09-03 LAB — MAGNESIUM: Magnesium: 2.1 mg/dL (ref 1.7–2.4)

## 2023-09-03 LAB — ACETAMINOPHEN LEVEL: Acetaminophen (Tylenol), Serum: <10 ug/mL — ABNORMAL LOW (ref 10–30)

## 2023-09-03 LAB — SALICYLATE LEVEL: Salicylate Lvl: <7 mg/dL — ABNORMAL LOW (ref 7.0–30.0)

## 2023-09-03 MED ORDER — CHLORDIAZEPOXIDE HCL 25 MG PO CAPS
50.0000 mg | ORAL_CAPSULE | Freq: Once | ORAL | Status: AC
  Administered 2023-09-03: 50 mg via ORAL
  Filled 2023-09-03: qty 2

## 2023-09-03 MED ORDER — LACTATED RINGERS IV BOLUS
1000.0000 mL | Freq: Once | INTRAVENOUS | Status: AC
  Administered 2023-09-03: 1000 mL via INTRAVENOUS

## 2023-09-03 MED ORDER — LORAZEPAM 1 MG PO TABS
1.0000 mg | ORAL_TABLET | ORAL | Status: DC | PRN
  Administered 2023-09-03 – 2023-09-05 (×3): 1 mg via ORAL
  Filled 2023-09-03 (×3): qty 1

## 2023-09-03 MED ORDER — THIAMINE HCL 100 MG/ML IJ SOLN
100.0000 mg | Freq: Every day | INTRAMUSCULAR | Status: DC
  Administered 2023-09-03: 100 mg via INTRAVENOUS
  Filled 2023-09-03: qty 2

## 2023-09-03 MED ORDER — VANCOMYCIN HCL IN DEXTROSE 1-5 GM/200ML-% IV SOLN
1000.0000 mg | Freq: Once | INTRAVENOUS | Status: AC
  Administered 2023-09-04: 1000 mg via INTRAVENOUS
  Filled 2023-09-03: qty 200

## 2023-09-03 MED ORDER — POTASSIUM CHLORIDE CRYS ER 20 MEQ PO TBCR
40.0000 meq | EXTENDED_RELEASE_TABLET | ORAL | Status: AC
  Administered 2023-09-03: 40 meq via ORAL
  Filled 2023-09-03: qty 2

## 2023-09-03 MED ORDER — METRONIDAZOLE 500 MG/100ML IV SOLN
500.0000 mg | Freq: Once | INTRAVENOUS | Status: AC
  Administered 2023-09-04: 500 mg via INTRAVENOUS
  Filled 2023-09-03: qty 100

## 2023-09-03 MED ORDER — ADULT MULTIVITAMIN W/MINERALS CH
1.0000 | ORAL_TABLET | Freq: Every day | ORAL | Status: DC
  Administered 2023-09-03 – 2023-09-09 (×7): 1 via ORAL
  Filled 2023-09-03 (×7): qty 1

## 2023-09-03 MED ORDER — SODIUM CHLORIDE 0.9 % IV SOLN
2.0000 g | Freq: Once | INTRAVENOUS | Status: AC
  Administered 2023-09-04: 2 g via INTRAVENOUS
  Filled 2023-09-03: qty 12.5

## 2023-09-03 MED ORDER — THIAMINE MONONITRATE 100 MG PO TABS
100.0000 mg | ORAL_TABLET | Freq: Every day | ORAL | Status: DC
  Administered 2023-09-04 – 2023-09-09 (×6): 100 mg via ORAL
  Filled 2023-09-03 (×6): qty 1

## 2023-09-03 MED ORDER — CHLORDIAZEPOXIDE HCL 25 MG PO CAPS: ORAL_CAPSULE | ORAL | 0 refills | Status: DC

## 2023-09-03 MED ORDER — FOLIC ACID 1 MG PO TABS
1.0000 mg | ORAL_TABLET | Freq: Every day | ORAL | Status: DC
  Administered 2023-09-03 – 2023-09-09 (×7): 1 mg via ORAL
  Filled 2023-09-03 (×7): qty 1

## 2023-09-03 NOTE — ED Notes (Signed)
Attempt for pt discharge was made per request of ED MD Dr. Eloise Harman. Pt was unable to support herself. Requiring 4 people to place her onto a wheelchair. At baseline pt live alone and is independent. Concern for safety and pt stability for discharge were voiced to EDP. Edp to speak with pts family at bedside.

## 2023-09-03 NOTE — Discharge Instructions (Signed)
You were seen for your alcohol use in the emergency department.   At home, please take the librium taper.    Go to [facility] tomorrow.   Return immediately to the emergency department if you experience any of the following: severe tremors, seizures, or any other concerning symptoms.    Thank you for visiting our Emergency Department. It was a pleasure taking care of you today.

## 2023-09-03 NOTE — ED Provider Notes (Signed)
[place] EMERGENCY DEPARTMENT AT [facility] Provider Note   CSN: [id number] Arrival date & time: 09/03/23  1612     History {Add pertinent medical, surgical, social history, OB history to HPI:1} Chief Complaint  Patient presents with   Fall   Nausea   Alcohol Intoxication    Stephanie Riley is a 72 y.o. female.  72 year old female with history of alcohol abuse and hypertension who presents to the emergency department after being found down by her ex-husband.  History obtained predominantly per the patient's ex-husband who states that she has been drinking more than usual recently.  He last saw her on Saturday and she was in her normal state of health.  Today went to check on her after not hearing from her for some time and was found on the ground.  Says that she was covered in urine.  Was next to a bottle of liquor.  Says that based on checking account spending has been buying half gallon bottles of vodka recently from the [facility] store.  No history of alcohol withdrawals.  Believes that her last drink was 2 days ago.  Per EMS patient was agitated and having nausea and vomiting and was given Haldol and Zofran.  They also thought that she may have fallen down some stairs.  Patient reports that she laid down on the stairs.  The patient and her ex-husband deny any SI or HI for the patient.       Home Medications Prior to Admission medications   Medication Sig Start Date End Date Taking? Authorizing Provider  sertraline (ZOLOFT) 100 MG tablet Take 1 tablet (100 mg total) by mouth daily. 07/13/23   [name], MD  amLODipine (NORVASC) 10 MG tablet Take 1 tablet (10 mg total) by mouth daily. 05/28/23 06/27/23  Lamar Sprinkles, MD  losartan-hydrochlorothiazide (HYZAAR) 100-12.5 MG tablet Take 1 tablet by mouth daily. 05/28/23 06/27/23  Lamar Sprinkles, MD  mirtazapine (REMERON) 7.5 MG tablet Take 1 tablet (7.5 mg total) by mouth at bedtime. 08/11/23   [name], MD  traZODone  (DESYREL) 100 MG tablet Take 1 tablet (100 mg total) by mouth at bedtime as needed for sleep. 07/13/23   [name], MD      Allergies    Patient has no known allergies.    Review of Systems   Review of Systems  Physical Exam Updated Vital Signs BP (!) 146/67 (BP Location: Left Arm)   Pulse 100   Temp (!) 97.3 F (36.3 C) (Oral)   Resp 16   SpO2 99%  Physical Exam Vitals and nursing note reviewed.  Constitutional:      General: She is not in acute distress.    Appearance: Normal appearance. She is well-developed. She is not ill-appearing.  HENT:     Head: Normocephalic and atraumatic.     Right Ear: External ear normal.     Left Ear: External ear normal.     Nose: Nose normal.     Mouth/Throat:     Mouth: Mucous membranes are moist.     Pharynx: Oropharynx is clear.  Eyes:     Extraocular Movements: Extraocular movements intact.     Conjunctiva/sclera: Conjunctivae normal.     Pupils: Pupils are equal, round, and reactive to light.     Comments: Pupils 4 mm bilaterally  Neck:     Comments: No C-spine midline tenderness to palpation Cardiovascular:     Rate and Rhythm: Normal rate and regular rhythm.  Pulses: Normal pulses.     Heart sounds: Normal heart sounds. No murmur heard. Pulmonary:     Effort: Pulmonary effort is normal. No respiratory distress.     Breath sounds: Normal breath sounds.  Abdominal:     General: Abdomen is flat. There is no distension.     Palpations: Abdomen is soft. There is no mass.     Tenderness: There is no abdominal tenderness. There is no guarding.  Musculoskeletal:        General: No deformity. Normal range of motion.     Cervical back: Normal range of motion and neck supple. No rigidity or tenderness.     Right lower leg: No edema.     Left lower leg: No edema.     Comments: No tenderness to palpation of midline thoracic or lumbar spine.  No step-offs palpated.  No tenderness to palpation of chest wall.  No bruising noted.   No tenderness to palpation of bilateral clavicles.  No tenderness to palpation, bruising, or deformities noted of bilateral shoulders, elbows, wrists, hips, knees, or ankles.  Skin:    General: Skin is warm and dry.  Neurological:     General: No focal deficit present.     Mental Status: She is alert. Mental status is at baseline.     Cranial Nerves: No cranial nerve deficit.     Sensory: No sensory deficit.     Motor: No weakness.     Comments: No tremor or tongue fasciculations.  Alert and oriented to self and year.  Thought that she was at a behavioral health center.  Psychiatric:        Mood and Affect: Mood normal.     ED Results / Procedures / Treatments   Labs (all labs ordered are listed, but only abnormal results are displayed) Labs Reviewed - No data to display  EKG None  Radiology No results found.  Procedures Procedures  {Document cardiac monitor, telemetry assessment procedure when appropriate:1}  Medications Ordered in ED Medications - No data to display  ED Course/ Medical Decision Making/ A&P   {   Click here for ABCD2, HEART and other calculatorsREFRESH Note before signing :1}                              Medical Decision Making Amount and/or Complexity of Data Reviewed Labs: ordered. Radiology: ordered.  Risk OTC drugs. Prescription drug management.   ***  {Document critical care time when appropriate:1} {Document review of labs and clinical decision tools ie heart score, Chads2Vasc2 etc:1}  {Document your independent review of radiology images, and any outside records:1} {Document your discussion with family members, caretakers, and with consultants:1} {Document social determinants of health affecting pt's care:1} {Document your decision making why or why not admission, treatments were needed:1} Final Clinical Impression(s) / ED Diagnoses Final diagnoses:  None    Rx / DC Orders ED Discharge Orders     None

## 2023-09-03 NOTE — ED Notes (Signed)
Pt set for discharge by EDP. Concerns for ongoing abnormal VS dicussed with EDP. EDP to see pt. Family at bedside updated in delay on discharge.

## 2023-09-03 NOTE — ED Triage Notes (Signed)
Pt coming from home. Pt was found drunk and on the floor today by family. Bruising noted on pts right side, pt states that she fell down stairs. Unknown how long pt was on ground. Pt is currently drunk, and has hx of drinking. Pt was very agitated and having N/V. 2.5 mg of halodol, 4mg  zofran, and 500 ml of NS given by EMS.

## 2023-09-04 ENCOUNTER — Encounter (HOSPITAL_COMMUNITY): Payer: Self-pay | Admitting: Internal Medicine

## 2023-09-04 ENCOUNTER — Other Ambulatory Visit: Payer: Self-pay

## 2023-09-04 DIAGNOSIS — E872 Acidosis, unspecified: Secondary | ICD-10-CM | POA: Diagnosis not present

## 2023-09-04 DIAGNOSIS — F1090 Alcohol use, unspecified, uncomplicated: Secondary | ICD-10-CM | POA: Diagnosis not present

## 2023-09-04 DIAGNOSIS — U071 COVID-19: Secondary | ICD-10-CM

## 2023-09-04 DIAGNOSIS — F10129 Alcohol abuse with intoxication, unspecified: Secondary | ICD-10-CM | POA: Diagnosis present

## 2023-09-04 DIAGNOSIS — R7401 Elevation of levels of liver transaminase levels: Secondary | ICD-10-CM

## 2023-09-04 DIAGNOSIS — Y907 Blood alcohol level of 200-239 mg/100 ml: Secondary | ICD-10-CM | POA: Diagnosis present

## 2023-09-04 DIAGNOSIS — I10 Essential (primary) hypertension: Secondary | ICD-10-CM | POA: Diagnosis present

## 2023-09-04 DIAGNOSIS — R748 Abnormal levels of other serum enzymes: Secondary | ICD-10-CM

## 2023-09-04 DIAGNOSIS — R6510 Systemic inflammatory response syndrome (SIRS) of non-infectious origin without acute organ dysfunction: Secondary | ICD-10-CM | POA: Diagnosis present

## 2023-09-04 DIAGNOSIS — T17908A Unspecified foreign body in respiratory tract, part unspecified causing other injury, initial encounter: Secondary | ICD-10-CM | POA: Diagnosis not present

## 2023-09-04 DIAGNOSIS — Z79899 Other long term (current) drug therapy: Secondary | ICD-10-CM | POA: Diagnosis not present

## 2023-09-04 DIAGNOSIS — E876 Hypokalemia: Secondary | ICD-10-CM

## 2023-09-04 DIAGNOSIS — E8729 Other acidosis: Secondary | ICD-10-CM | POA: Diagnosis present

## 2023-09-04 DIAGNOSIS — E860 Dehydration: Secondary | ICD-10-CM | POA: Diagnosis present

## 2023-09-04 DIAGNOSIS — F10929 Alcohol use, unspecified with intoxication, unspecified: Secondary | ICD-10-CM

## 2023-09-04 DIAGNOSIS — D649 Anemia, unspecified: Secondary | ICD-10-CM | POA: Diagnosis present

## 2023-09-04 LAB — BASIC METABOLIC PANEL
Anion gap: 15 (ref 5–15)
BUN: 7 mg/dL — ABNORMAL LOW (ref 8–23)
CO2: 17 mmol/L — ABNORMAL LOW (ref 22–32)
Calcium: 8.3 mg/dL — ABNORMAL LOW (ref 8.9–10.3)
Chloride: 102 mmol/L (ref 98–111)
Creatinine, Ser: 0.55 mg/dL (ref 0.44–1.00)
GFR, Estimated: >60 mL/min (ref >60)
Glucose, Bld: 120 mg/dL — ABNORMAL HIGH (ref 70–99)
Potassium: 2.8 mmol/L — ABNORMAL LOW (ref 3.5–5.1)
Sodium: 134 mmol/L — ABNORMAL LOW (ref 135–145)

## 2023-09-04 LAB — URINALYSIS, ROUTINE W REFLEX MICROSCOPIC
Bilirubin Urine: NEGATIVE
Glucose, UA: NEGATIVE mg/dL
Ketones, ur: 80 mg/dL — AB
Leukocytes,Ua: NEGATIVE
Nitrite: NEGATIVE
Protein, ur: 100 mg/dL — AB
Specific Gravity, Urine: 1.013 (ref 1.005–1.030)
pH: 5 (ref 5.0–8.0)

## 2023-09-04 LAB — COMPREHENSIVE METABOLIC PANEL
ALT: 64 U/L — ABNORMAL HIGH (ref 0–44)
AST: 137 U/L — ABNORMAL HIGH (ref 15–41)
Albumin: 3 g/dL — ABNORMAL LOW (ref 3.5–5.0)
Alkaline Phosphatase: 62 U/L (ref 38–126)
Anion gap: 18 — ABNORMAL HIGH (ref 5–15)
BUN: 9 mg/dL (ref 8–23)
CO2: 14 mmol/L — ABNORMAL LOW (ref 22–32)
Calcium: 7.7 mg/dL — ABNORMAL LOW (ref 8.9–10.3)
Chloride: 98 mmol/L (ref 98–111)
Creatinine, Ser: 0.76 mg/dL (ref 0.44–1.00)
GFR, Estimated: >60 mL/min (ref >60)
Glucose, Bld: 137 mg/dL — ABNORMAL HIGH (ref 70–99)
Potassium: 3.1 mmol/L — ABNORMAL LOW (ref 3.5–5.1)
Sodium: 130 mmol/L — ABNORMAL LOW (ref 135–145)
Total Bilirubin: 2.2 mg/dL — ABNORMAL HIGH (ref 0.3–1.2)
Total Protein: 5.5 g/dL — ABNORMAL LOW (ref 6.5–8.1)

## 2023-09-04 LAB — MAGNESIUM: Magnesium: 1.7 mg/dL (ref 1.7–2.4)

## 2023-09-04 LAB — RAPID URINE DRUG SCREEN, HOSP PERFORMED
Amphetamines: NOT DETECTED
Barbiturates: NOT DETECTED
Benzodiazepines: NOT DETECTED
Cocaine: NOT DETECTED
Opiates: NOT DETECTED
Tetrahydrocannabinol: NOT DETECTED

## 2023-09-04 LAB — CBC
HCT: 29.8 % — ABNORMAL LOW (ref 36.0–46.0)
Hemoglobin: 9.7 g/dL — ABNORMAL LOW (ref 12.0–15.0)
MCH: 30.8 pg (ref 26.0–34.0)
MCHC: 32.6 g/dL (ref 30.0–36.0)
MCV: 94.6 fL (ref 80.0–100.0)
Platelets: 135 10*3/uL — ABNORMAL LOW (ref 150–400)
RBC: 3.15 MIL/uL — ABNORMAL LOW (ref 3.87–5.11)
RDW: 18.6 % — ABNORMAL HIGH (ref 11.5–15.5)
WBC: 5.7 10*3/uL (ref 4.0–10.5)
nRBC: 0 % (ref 0.0–0.2)

## 2023-09-04 LAB — PHOSPHORUS: Phosphorus: 1.3 mg/dL — ABNORMAL LOW (ref 2.5–4.6)

## 2023-09-04 LAB — STREP PNEUMONIAE URINARY ANTIGEN: Strep Pneumo Urinary Antigen: NEGATIVE

## 2023-09-04 LAB — CK: Total CK: 373 U/L — ABNORMAL HIGH (ref 38–234)

## 2023-09-04 LAB — RESP PANEL BY RT-PCR (RSV, FLU A&B, COVID)  RVPGX2
Influenza A by PCR: NEGATIVE
Influenza B by PCR: NEGATIVE
Resp Syncytial Virus by PCR: NEGATIVE
SARS Coronavirus 2 by RT PCR: POSITIVE — AB

## 2023-09-04 LAB — LACTIC ACID, PLASMA
Lactic Acid, Venous: 0.8 mmol/L (ref 0.5–1.9)
Lactic Acid, Venous: 2.9 mmol/L (ref 0.5–1.9)

## 2023-09-04 LAB — PROCALCITONIN: Procalcitonin: 0.57 ng/mL

## 2023-09-04 MED ORDER — LOSARTAN POTASSIUM-HCTZ 100-12.5 MG PO TABS: 1.0000 | ORAL_TABLET | Freq: Every day | ORAL | Status: DC

## 2023-09-04 MED ORDER — DEXTROSE-NACL 5-0.45 % IV SOLN
INTRAVENOUS | Status: DC
  Filled 2023-09-04 (×2): qty 1000

## 2023-09-04 MED ORDER — AMLODIPINE BESYLATE 10 MG PO TABS
10.0000 mg | ORAL_TABLET | Freq: Every day | ORAL | Status: DC
  Administered 2023-09-04 – 2023-09-09 (×6): 10 mg via ORAL
  Filled 2023-09-04 (×6): qty 1

## 2023-09-04 MED ORDER — HYDROCHLOROTHIAZIDE 12.5 MG PO TABS: 12.5000 mg | ORAL_TABLET | Freq: Every day | ORAL | Status: DC

## 2023-09-04 MED ORDER — ONDANSETRON HCL 4 MG/2ML IJ SOLN
4.0000 mg | Freq: Four times a day (QID) | INTRAMUSCULAR | Status: DC | PRN
  Administered 2023-09-05: 4 mg via INTRAVENOUS
  Filled 2023-09-04: qty 2

## 2023-09-04 MED ORDER — LOPERAMIDE HCL 2 MG PO CAPS
2.0000 mg | ORAL_CAPSULE | ORAL | Status: DC | PRN
  Administered 2023-09-04 (×2): 4 mg via ORAL
  Administered 2023-09-05: 2 mg via ORAL
  Administered 2023-09-05: 4 mg via ORAL
  Administered 2023-09-05: 2 mg via ORAL
  Filled 2023-09-04: qty 2
  Filled 2023-09-04: qty 1
  Filled 2023-09-04 (×3): qty 2

## 2023-09-04 MED ORDER — SODIUM CHLORIDE 0.9 % IV SOLN
3.0000 g | Freq: Four times a day (QID) | INTRAVENOUS | Status: DC
  Administered 2023-09-04 – 2023-09-09 (×19): 3 g via INTRAVENOUS
  Filled 2023-09-04 (×19): qty 8

## 2023-09-04 MED ORDER — DM-GUAIFENESIN ER 30-600 MG PO TB12
1.0000 | ORAL_TABLET | Freq: Two times a day (BID) | ORAL | Status: DC
  Administered 2023-09-04 – 2023-09-09 (×11): 1 via ORAL
  Filled 2023-09-04 (×11): qty 1

## 2023-09-04 MED ORDER — CHLORDIAZEPOXIDE HCL 25 MG PO CAPS: 25.0000 mg | ORAL_CAPSULE | Freq: Four times a day (QID) | ORAL | Status: AC | PRN

## 2023-09-04 MED ORDER — DEXTROSE-SODIUM CHLORIDE 5-0.45 % IV SOLN: INTRAVENOUS | Status: DC

## 2023-09-04 MED ORDER — IBUPROFEN 800 MG PO TABS
800.0000 mg | ORAL_TABLET | Freq: Once | ORAL | Status: AC
  Administered 2023-09-04: 800 mg via ORAL
  Filled 2023-09-04: qty 1

## 2023-09-04 MED ORDER — HYDROXYZINE HCL 25 MG PO TABS
25.0000 mg | ORAL_TABLET | Freq: Four times a day (QID) | ORAL | Status: DC | PRN
  Administered 2023-09-05 – 2023-09-06 (×4): 25 mg via ORAL
  Filled 2023-09-04 (×4): qty 1

## 2023-09-04 MED ORDER — CALCIUM CARBONATE 1250 (500 CA) MG PO TABS
1.0000 | ORAL_TABLET | Freq: Every day | ORAL | Status: DC
  Administered 2023-09-04 – 2023-09-09 (×6): 1250 mg via ORAL
  Filled 2023-09-04 (×6): qty 1

## 2023-09-04 MED ORDER — THIAMINE HCL 100 MG/ML IJ SOLN
100.0000 mg | Freq: Once | INTRAMUSCULAR | Status: AC
  Administered 2023-09-04: 100 mg via INTRAMUSCULAR
  Filled 2023-09-04: qty 2

## 2023-09-04 MED ORDER — POTASSIUM CHLORIDE CRYS ER 20 MEQ PO TBCR
40.0000 meq | EXTENDED_RELEASE_TABLET | ORAL | Status: AC
  Administered 2023-09-04 (×2): 40 meq via ORAL
  Filled 2023-09-04 (×2): qty 2

## 2023-09-04 MED ORDER — HEPARIN SODIUM (PORCINE) 5000 UNIT/ML IJ SOLN
5000.0000 [IU] | Freq: Three times a day (TID) | INTRAMUSCULAR | Status: DC
  Administered 2023-09-04 – 2023-09-09 (×17): 5000 [IU] via SUBCUTANEOUS
  Filled 2023-09-04 (×17): qty 1

## 2023-09-04 MED ORDER — MAGNESIUM SULFATE 2 GM/50ML IV SOLN
2.0000 g | Freq: Once | INTRAVENOUS | Status: AC
  Administered 2023-09-04: 2 g via INTRAVENOUS
  Filled 2023-09-04: qty 50

## 2023-09-04 MED ORDER — ONDANSETRON HCL 4 MG PO TABS
4.0000 mg | ORAL_TABLET | Freq: Four times a day (QID) | ORAL | Status: DC | PRN
  Administered 2023-09-05 – 2023-09-07 (×4): 4 mg via ORAL
  Filled 2023-09-04 (×4): qty 1

## 2023-09-04 MED ORDER — LOSARTAN POTASSIUM 50 MG PO TABS: 100.0000 mg | ORAL_TABLET | Freq: Every day | ORAL | Status: DC

## 2023-09-04 NOTE — ED Notes (Signed)
ED TO INPATIENT HANDOFF REPORT  ED Nurse Name and Phone #: Louie Casa Name/Age/Gender Stephanie Riley Labo 72 y.o. female Room/Bed: WA18/WA18  Code Status   Code Status: Prior  Home/SNF/Other Home Patient oriented to: self, place, time, and situation Is this baseline? Yes   Triage Complete: Triage complete  Chief Complaint Alcoholic ketoacidosis [E87.29]  Triage Note Pt coming from home. Pt was found drunk and on the floor today by family. Bruising noted on pts right side, pt states that she fell down stairs. Unknown how long pt was on ground. Pt is currently drunk, and has hx of drinking. Pt was very agitated and having N/V. 2.5 mg of halodol, 4mg  zofran, and 500 ml of NS given by EMS.    Allergies No Known Allergies  Level of Care/Admitting Diagnosis ED Disposition     ED Disposition  Admit   Condition  --   Comment  Hospital Area: [facility] [[id number]]  Level of Care: Progressive [102]  Admit to Progressive based on following criteria: Other see comments  Comments: HAGMA, Alcoholic ketoacidosis  May admit patient to [facility] or [facility] if equivalent level of care is available:: Yes  Covid Evaluation: Asymptomatic - no recent exposure (last 10 days) testing not required  Diagnosis: Alcoholic ketoacidosis [172225]  Admitting Physician: Frankey Shown [[id number]]  Attending Physician: Frankey Shown [[id number]]  Certification:: I certify this patient will need inpatient services for at least 2 midnights  Expected Medical Readiness: 09/06/2023          B Medical/Surgery History Past Medical History:  Diagnosis Date   Alcohol abuse    HTN (hypertension)    History reviewed. No pertinent surgical history.   A IV Location/Drains/Wounds Patient Lines/Drains/Airways Status     Active Line/Drains/Airways     Name Placement date Placement time Site Days   Peripheral IV 09/03/23 20 G 1" Right Forearm 09/03/23  2330  Forearm  1    Peripheral IV 09/03/23 20 G Right Antecubital 09/03/23  2352  Antecubital  1            Intake/Output Last 24 hours  Intake/Output Summary (Last 24 hours) at 09/04/2023 0020 Last data filed at 09/04/2023 0010 Gross per 24 hour  Intake --  Output 700 ml  Net -700 ml    Labs/Imaging Results for orders placed or performed during the hospital encounter of 09/03/23 (from the past 48 hour(s))  CBG monitoring, ED     Status: None   Collection Time: 09/03/23  5:25 PM  Result Value Ref Range   Glucose-Capillary 81 70 - 99 mg/dL    Comment: Glucose reference range applies only to samples taken after fasting for at least 8 hours.  Comprehensive metabolic panel     Status: Abnormal   Collection Time: 09/03/23  5:30 PM  Result Value Ref Range   Sodium 134 (L) 135 - 145 mmol/L   Potassium 3.0 (L) 3.5 - 5.1 mmol/L   Chloride 98 98 - 111 mmol/L   CO2 8 (L) 22 - 32 mmol/L   Glucose, Bld 80 70 - 99 mg/dL    Comment: Glucose reference range applies only to samples taken after fasting for at least 8 hours.   BUN 10 8 - 23 mg/dL   Creatinine, Ser 6.96 0.44 - 1.00 mg/dL   Calcium 7.6 (L) 8.9 - 10.3 mg/dL   Total Protein 7.1 6.5 - 8.1 g/dL   Albumin 3.9 3.5 - 5.0 g/dL   AST  185 (H) 15 - 41 U/L   ALT 81 (H) 0 - 44 U/L   Alkaline Phosphatase 77 38 - 126 U/L   Total Bilirubin 1.8 (H) 0.3 - 1.2 mg/dL   GFR, Estimated >16 >10 mL/min    Comment: (NOTE) Calculated using the CKD-EPI Creatinine Equation (2021)    Anion gap 28 (H) 5 - 15    Comment: ELECTROLYTES REPEATED TO VERIFY Performed at [facility], [address], [place], [place] [address]   CBC WITH DIFFERENTIAL     Status: Abnormal   Collection Time: 09/03/23  5:30 PM  Result Value Ref Range   WBC 14.0 (H) 4.0 - 10.5 K/uL   RBC 3.77 (L) 3.87 - 5.11 MIL/uL   Hemoglobin 11.6 (L) 12.0 - 15.0 g/dL   HCT 40.9 81.1 - 91.4 %   MCV 96.3 80.0 - 100.0 fL   MCH 30.8 26.0 - 34.0 pg   MCHC 32.0 30.0 - 36.0 g/dL   RDW 78.2  (H) 95.6 - 15.5 %   Platelets 225 150 - 400 K/uL   nRBC 0.0 0.0 - 0.2 %   Neutrophils Relative % 85 %   Neutro Abs 12.0 (H) 1.7 - 7.7 K/uL   Lymphocytes Relative 6 %   Lymphs Abs 0.8 0.7 - 4.0 K/uL   Monocytes Relative 7 %   Monocytes Absolute 1.0 0.1 - 1.0 K/uL   Eosinophils Relative 0 %   Eosinophils Absolute 0.0 0.0 - 0.5 K/uL   Basophils Relative 0 %   Basophils Absolute 0.1 0.0 - 0.1 K/uL   Immature Granulocytes 2 %   Abs Immature Granulocytes 0.21 (H) 0.00 - 0.07 K/uL    Comment: Performed at [facility], [address], [place], [place] [address]  Ethanol     Status: Abnormal   Collection Time: 09/03/23  5:30 PM  Result Value Ref Range   Alcohol, Ethyl (B) 215 (H) <10 mg/dL    Comment: (NOTE) Lowest detectable limit for serum alcohol is 10 mg/dL.  For medical purposes only. Performed at [facility], [address], [place], [place] [address]   Acetaminophen level     Status: Abnormal   Collection Time: 09/03/23  5:30 PM  Result Value Ref Range   Acetaminophen (Tylenol), Serum <10 (L) 10 - 30 ug/mL    Comment: (NOTE) Therapeutic concentrations vary significantly. A range of 10-30 ug/mL  may be an effective concentration for many patients. However, some  are best treated at concentrations outside of this range. Acetaminophen concentrations >150 ug/mL at 4 hours after ingestion  and >50 ug/mL at 12 hours after ingestion are often associated with  toxic reactions.  Performed at [facility], [address] [place], [place] [address]   Salicylate level     Status: Abnormal   Collection Time: 09/03/23  5:30 PM  Result Value Ref Range   Salicylate Lvl <7.0 (L) 7.0 - 30.0 mg/dL    Comment: Performed at [facility], [address], [place], [place] [address]  CK     Status: Abnormal   Collection Time: 09/03/23  5:30 PM  Result Value Ref Range   Total CK 575 (H) 38 - 234 U/L    Comment:  Performed at [facility], [address], [place], [place] [address]  Magnesium     Status: None   Collection Time: 09/03/23  5:30 PM  Result Value Ref Range   Magnesium 2.1 1.7 -  2.4 mg/dL    Comment: Performed at [facility], [address], [place], [place] [address]   DG Pelvis Portable  Result Date: 09/03/2023 CLINICAL DATA:  Fall EXAM: PORTABLE PELVIS 1-2 VIEWS COMPARISON:  None Available. FINDINGS: There is no evidence of pelvic fracture or diastasis. No pelvic bone lesions are seen. IMPRESSION: Negative. Electronically Signed   By: Charlett Nose M.D.   On: 09/03/2023 18:20   DG Chest Portable 1 View  Result Date: 09/03/2023 CLINICAL DATA:  Fall EXAM: PORTABLE CHEST 1 VIEW COMPARISON:  05/25/2023 FINDINGS: The heart size and mediastinal contours are within normal limits. Both lungs are clear. The visualized skeletal structures are unremarkable. IMPRESSION: No active disease. Electronically Signed   By: Charlett Nose M.D.   On: 09/03/2023 18:20   CT Cervical Spine Wo Contrast  Result Date: 09/03/2023 CLINICAL DATA:  Neck trauma (Age >= 65y), fall EXAM: CT CERVICAL SPINE WITHOUT CONTRAST TECHNIQUE: Multidetector CT imaging of the cervical spine was performed without intravenous contrast. Multiplanar CT image reconstructions were also generated. RADIATION DOSE REDUCTION: This exam was performed according to the departmental dose-optimization program which includes automated exposure control, adjustment of the mA and/or kV according to patient size and/or use of iterative reconstruction technique. COMPARISON:  None Available. FINDINGS: Alignment: No subluxation Skull base and vertebrae: No acute fracture. No primary bone lesion or focal pathologic process. Soft tissues and spinal canal: No prevertebral fluid or swelling. No visible canal hematoma. Disc levels: Diffuse degenerative disc disease with disc space narrowing and spurring. Bilateral degenerative  facet disease, left greater than right. Upper chest: No acute findings Other: None IMPRESSION: Degenerative disc and facet disease. No acute bony abnormality. Electronically Signed   By: Charlett Nose M.D.   On: 09/03/2023 18:19   CT HEAD WO CONTRAST  Result Date: 09/03/2023 CLINICAL DATA:  Head trauma EXAM: CT HEAD WITHOUT CONTRAST TECHNIQUE: Contiguous axial images were obtained from the base of the skull through the vertex without intravenous contrast. RADIATION DOSE REDUCTION: This exam was performed according to the departmental dose-optimization program which includes automated exposure control, adjustment of the mA and/or kV according to patient size and/or use of iterative reconstruction technique. COMPARISON:  None Available. FINDINGS: Brain: There is atrophy and chronic small vessel disease changes. No acute intracranial abnormality. Specifically, no hemorrhage, hydrocephalus, mass lesion, acute infarction, or significant intracranial injury. Vascular: No hyperdense vessel or unexpected calcification. Skull: No acute calvarial abnormality. Sinuses/Orbits: No acute findings Other: None IMPRESSION: Atrophy, chronic microvascular disease. No acute intracranial abnormality. Electronically Signed   By: Charlett Nose M.D.   On: 09/03/2023 18:18    Pending Labs Unresulted Labs (From admission, onward)     Start     Ordered   09/03/23 2334  Lactic acid, plasma  (Lactic Acid)  Now then every 2 hours,   R (with STAT occurrences)      09/03/23 2333   09/03/23 2333  Resp panel by RT-PCR (RSV, Flu A&B, Covid) Anterior Nasal Swab  Once,   URGENT        09/03/23 2333   09/03/23 2333  Blood culture (routine x 2)  BLOOD CULTURE X 2,   R (with STAT occurrences)      09/03/23 2333   09/03/23 1653  Urine rapid drug screen (hosp performed)  Once,   STAT        09/03/23 1652   09/03/23 1652  Urinalysis, Routine w reflex microscopic -Urine, Clean Catch  Once,   URGENT  Question:  Specimen Source  Answer:   Urine, Clean Catch   09/03/23 1652            Vitals/Pain Today's Vitals   09/03/23 2328 09/03/23 2330 09/03/23 2345 09/04/23 0001  BP:  (!) 153/80    Pulse: (!) 111 (!) 115 (!) 115   Resp: (!) 27 (!) 27 (!) 28   Temp:    (!) 100.5 F (38.1 C)  TempSrc:    Rectal  SpO2: 96% 97% 97%   PainSc:        Isolation Precautions No active isolations  Medications Medications  LORazepam (ATIVAN) tablet 1-4 mg (1 mg Oral Given 09/03/23 2108)  thiamine (VITAMIN B1) tablet 100 mg ( Oral See Alternative 09/03/23 1725)    Or  thiamine (VITAMIN B1) injection 100 mg (100 mg Intravenous Given 09/03/23 1725)  folic acid (FOLVITE) tablet 1 mg (1 mg Oral Given 09/03/23 1751)  multivitamin with minerals tablet 1 tablet (1 tablet Oral Given 09/03/23 1725)  ceFEPIme (MAXIPIME) 2 g in sodium chloride 0.9 % 100 mL IVPB (2 g Intravenous New Bag/Given 09/04/23 0009)  metroNIDAZOLE (FLAGYL) IVPB 500 mg (500 mg Intravenous New Bag/Given 09/04/23 0012)  vancomycin (VANCOCIN) IVPB 1000 mg/200 mL premix (1,000 mg Intravenous New Bag/Given 09/04/23 0013)  dextrose 5 % and 0.45 % NaCl infusion (has no administration in time range)  lactated ringers bolus 1,000 mL (0 mLs Intravenous Stopped 09/03/23 2104)  potassium chloride SA (KLOR-CON M) CR tablet 40 mEq (40 mEq Oral Given 09/03/23 1904)  lactated ringers bolus 1,000 mL (0 mLs Intravenous Stopped 09/03/23 2130)  chlordiazePOXIDE (LIBRIUM) capsule 50 mg (50 mg Oral Given 09/03/23 2216)  ibuprofen (ADVIL) tablet 800 mg (800 mg Oral Given 09/04/23 0016)    Mobility non-ambulatory     Focused Assessments Cardiac Assessment Handoff:  Cardiac Rhythm: Sinus tachycardia Lab Results  Component Value Date   CKTOTAL 575 (H) 09/03/2023   No results found for: "DDIMER" Does the Patient currently have chest pain? No   , Neuro Assessment Handoff:   Cardiac Rhythm: Sinus tachycardia       Neuro Assessment:   Neuro Checks:       If patient is a Neuro Trauma  and patient is going to OR before floor call report to 4N Charge nurse: [phone] or [phone]   R Recommendations: See Admitting Provider Note  Report given to:   Additional Notes: -

## 2023-09-04 NOTE — Assessment & Plan Note (Signed)
Hold ARB/hydrochlorothiazide due to poor po intake.

## 2023-09-04 NOTE — ED Notes (Signed)
Verbal report given to RN Helda, Room 1436. Per RN, room needs to be mopped prior to pt arrival.

## 2023-09-04 NOTE — Subjective & Objective (Signed)
Pt seen and examined. Admitted for acute etoh intoxication and alcoholic ketoacidosis. Pt denies excessive alcohol use. Will not tell me how much alcohol she drinks. Denies she was found down on the ground despite EMS documentation that she was soaked in urine and vomit.  No Ex-husband or family at bedside.

## 2023-09-04 NOTE — Assessment & Plan Note (Signed)
On CIWA protocol.

## 2023-09-04 NOTE — Progress Notes (Signed)
PROGRESS NOTE    Stephanie Riley  FIE:[id number] DOB: Oct 13, 1951 DOA: 09/03/2023 PCP: Robby Sermon, DO  Subjective: Pt seen and examined. Admitted for acute etoh intoxication and alcoholic ketoacidosis. Pt denies excessive alcohol use. Will not tell me how much alcohol she drinks. Denies she was found down on the ground despite EMS documentation that she was soaked in urine and vomit.  No Ex-husband or family at bedside.   Hospital Course: HPI: Stephanie Riley is a 72 y.o. female with medical history significant of hypertension, alcohol abuse who presents to the emergency department via EMS due to alcohol intoxication.  Patient was somnolent, though easily arousable, she was unable to provide a history, history was provided by ex-husband at bedside and ED physician.  Per report, couple separated after 44 years of marriage due to patient's persistent history of alcohol abuse per husband.  He last saw her on Saturday and she was in her normal state of health.  Since he did not hear from her for some time, he went to check on her around 10:30 AM to 11 AM yesterday and she was found on the ground covered in urine, next to her was an empty big bottle of vodka.  Per ex-husband, patient's checking account was checked and it was noted that she has been buying half gallon bottles of water recently from [facility] store.  EMS was activated, and on arrival of EMS team, patient was agitated, she had nausea and vomiting while laying in a supine position (per husband).  It was thought that she may have fallen down some stairs, Zofran and Haldol were given.  She was sent to the ED for further evaluation and management.   Significant Events: Admitted 09/03/2023 for acute etoh intoxication, alcoholic ketoacidosis and fall   Significant Labs: Admitting ETOH 215 Admitting Covid PCR POSITIVE  Significant Imaging Studies: Admitting CT head showed Atrophy, chronic microvascular disease. No acute  intracranial abnormality Admitting CT c-spine showed Degenerative disc and facet disease. No acute bony abnormality Admitting CXR shows no active disease Admitting pelvic XR showed no acute fractures  Antibiotic Therapy: Anti-infectives (From admission, onward)    Start     Dose/Rate Route Frequency Ordered Stop   09/04/23 0800  Ampicillin-Sulbactam (UNASYN) 3 g in sodium chloride 0.9 % 100 mL IVPB        3 g 200 mL/hr over 30 Minutes Intravenous Every 6 hours 09/04/23 0120     09/04/23 0000  ceFEPIme (MAXIPIME) 2 g in sodium chloride 0.9 % 100 mL IVPB        2 g 200 mL/hr over 30 Minutes Intravenous  Once 09/03/23 2345 09/04/23 0054   09/04/23 0000  metroNIDAZOLE (FLAGYL) IVPB 500 mg        500 mg 100 mL/hr over 60 Minutes Intravenous  Once 09/03/23 2345 09/04/23 0118   09/04/23 0000  vancomycin (VANCOCIN) IVPB 1000 mg/200 mL premix        1,000 mg 200 mL/hr over 60 Minutes Intravenous  Once 09/03/23 2345 09/04/23 0119       Procedures:   Consultants:     Assessment and Plan: * Alcoholic ketoacidosis Admitted for alcoholic ketoacidosis. Remains on IVF.  Aspiration into airway No evidence of pneumonia. Continue with IV unasyn. Repeat CBC and procalcitonin in AM.  COVID-19 virus infection Incidentally noted. Not hypoxic. Hold on any treatment for now.  Transaminitis Likely due to etoh use. Repeat CMP in AM. Check INR in AM. Will calculate Maddrey's function in AM.  SIRS (systemic inflammatory response syndrome) (HCC) Reportedly pt found with vomit on her. CXR negative. Procal is slightly elevated. WBC normal. Possible she has chemical pneumonitis. Remains on IV unasyn. Will repeat procal in AM and de-escalate ABX if needed. Unclear if she has true infection or if elevated procalcitonin is from ketoacidosis. UA is negative.  Essential hypertension Hold ARB/hydrochlorothiazide due to poor po intake.  Hypocalcemia Stable. No tetany.  Hypokalemia Continue with  repletion. Repeat CMP and Mg in AM.  Elevated CK Likely due to being found on the ground for prolonged period of time. No AKI. No need to repeat CK.  Lactic acidosis Improved. Continue with IVF.  Alcohol use disorder On CIWA protocol.   DVT prophylaxis: heparin injection 5,000 Units Start: 09/04/23 0600 SCDs Start: 09/04/23 0122    Code Status: Full Code Family Communication: no family at bedside Disposition Plan: unknown Reason for continuing need for hospitalization: remains on IVF, CIWA protocol  Objective: Vitals:   09/04/23 0226 09/04/23 0239 09/04/23 0659 09/04/23 1050  BP: 129/71  (!) 120/55 (!) 140/75  Pulse: 97  83 82  Resp: 20 20 20 16   Temp: 99.1 F (37.3 C)  98.2 F (36.8 C) 98.6 F (37 C)  TempSrc: Oral  Oral Oral  SpO2: 100%  98% 99%    Intake/Output Summary (Last 24 hours) at 09/04/2023 1142 Last data filed at 09/04/2023 1052 Gross per 24 hour  Intake 2789.58 ml  Output 1300 ml  Net 1489.58 ml   There were no vitals filed for this visit.  Examination:  Physical Exam Vitals and nursing note reviewed.  Constitutional:      Comments: Chronically ill appearing  HENT:     Head: Normocephalic and atraumatic.  Eyes:     General: No scleral icterus. Cardiovascular:     Rate and Rhythm: Normal rate and regular rhythm.  Pulmonary:     Effort: Pulmonary effort is normal. No respiratory distress.  Abdominal:     General: Bowel sounds are normal. There is no distension.  Musculoskeletal:     Right lower leg: No edema.     Left lower leg: No edema.  Skin:    General: Skin is warm and dry.     Capillary Refill: Capillary refill takes less than 2 seconds.  Neurological:     Mental Status: She is oriented to person, place, and time.     Data Reviewed: I have personally reviewed following labs and imaging studies  CBC: Recent Labs  Lab 09/03/23 1730 09/04/23 0417  WBC 14.0* 5.7  NEUTROABS 12.0*  --   HGB 11.6* 9.7*  HCT 36.3 29.8*  MCV 96.3  94.6  PLT 225 135*   Basic Metabolic Panel: Recent Labs  Lab 09/03/23 1730 09/04/23 0417 09/04/23 0958  NA 134* 130* 134*  K 3.0* 3.1* 2.8*  CL 98 98 102  CO2 8* 14* 17*  GLUCOSE 80 137* 120*  BUN 10 9 7*  CREATININE 0.58 0.76 0.55  CALCIUM 7.6* 7.7* 8.3*  MG 2.1 1.7  --   PHOS  --  1.3*  --    GFR: CrCl cannot be calculated (Unknown ideal weight.). Liver Function Tests: Recent Labs  Lab 09/03/23 1730 09/04/23 0417  AST 185* 137*  ALT 81* 64*  ALKPHOS 77 62  BILITOT 1.8* 2.2*  PROT 7.1 5.5*  ALBUMIN 3.9 3.0*   Cardiac Enzymes: Recent Labs  Lab 09/03/23 1730 09/04/23 0417  CKTOTAL 575* 373*   CBG: Recent Labs  Lab 09/03/23  1725  GLUCAP 81   Sepsis Labs: Recent Labs  Lab 09/03/23 2334 09/04/23 0417  PROCALCITON  --  0.57  LATICACIDVEN 2.9* 0.8    Recent Results (from the past 240 hour(s))  Blood culture (routine x 2)     Status: None (Preliminary result)   Collection Time: 09/03/23 11:33 PM   Specimen: BLOOD RIGHT FOREARM  Result Value Ref Range Status   Specimen Description   Final    BLOOD RIGHT FOREARM Performed at [facility], [address], [place], [place] [address]    Special Requests   Final    BOTTLES DRAWN AEROBIC AND ANAEROBIC Blood Culture adequate volume Performed at [facility], [address], [place], [place] [address]    Culture   Final    NO GROWTH < 12 HOURS Performed at [facility], [address] [place], [place] [address]    Report Status PENDING  Incomplete  Blood culture (routine x 2)     Status: None (Preliminary result)   Collection Time: 09/03/23 11:38 PM   Specimen: BLOOD  Result Value Ref Range Status   Specimen Description   Final    BLOOD RIGHT ANTECUBITAL Performed at [facility], [address], [place], [place] [address]    Special Requests   Final    BOTTLES DRAWN AEROBIC AND ANAEROBIC Blood Culture results may not be optimal due to an excessive  volume of blood received in culture bottles Performed at [facility], [address], [place], [place] [address]    Culture   Final    NO GROWTH < 12 HOURS Performed at [facility], [address] [place], [place] [address]    Report Status PENDING  Incomplete  Resp panel by RT-PCR (RSV, Flu A&B, Covid) Anterior Nasal Swab     Status: Abnormal   Collection Time: 09/03/23 11:51 PM   Specimen: Anterior Nasal Swab  Result Value Ref Range Status   SARS Coronavirus 2 by RT PCR POSITIVE (A) NEGATIVE Final    Comment: (NOTE) SARS-CoV-2 target nucleic acids are DETECTED.  The SARS-CoV-2 RNA is generally detectable in upper respiratory specimens during the acute phase of infection. Positive results are indicative of the presence of the identified virus, but do not rule out bacterial infection or co-infection with other pathogens not detected by the test. Clinical correlation with patient history and other diagnostic information is necessary to determine patient infection status. The expected result is Negative.  Fact Sheet for Patients: BloggerCourse.com  Fact Sheet for Healthcare Providers: SeriousBroker.it  This test is not yet approved or cleared by the [place] FDA and  has been authorized for detection and/or diagnosis of SARS-CoV-2 by FDA under an Emergency Use Authorization (EUA).  This EUA will remain in effect (meaning this test can be used) for the duration of  the COVID-19 declaration under Section 564(b)(1) of the A ct, 21 U.S.C. section 360bbb-3(b)(1), unless the authorization is terminated or revoked sooner.     Influenza A by PCR NEGATIVE NEGATIVE Final   Influenza B by PCR NEGATIVE NEGATIVE Final    Comment: (NOTE) The Xpert Xpress SARS-CoV-2/FLU/RSV plus assay is intended as an aid in the diagnosis of influenza from Nasopharyngeal swab specimens and should not be used as a sole basis  for treatment. Nasal washings and aspirates are unacceptable for Xpert Xpress SARS-CoV-2/FLU/RSV testing.  Fact Sheet for Patients: BloggerCourse.com  Fact Sheet for Healthcare Providers: SeriousBroker.it  This  test is not yet approved or cleared by the [place] and has been authorized for detection and/or diagnosis of SARS-CoV-2 by FDA under an Emergency Use Authorization (EUA). This EUA will remain in effect (meaning this test can be used) for the duration of the COVID-19 declaration under Section 564(b)(1) of the Act, 21 U.S.C. section 360bbb-3(b)(1), unless the authorization is terminated or revoked.     Resp Syncytial Virus by PCR NEGATIVE NEGATIVE Final    Comment: (NOTE) Fact Sheet for Patients: BloggerCourse.com  Fact Sheet for Healthcare Providers: SeriousBroker.it  This test is not yet approved or cleared by the [place] FDA and has been authorized for detection and/or diagnosis of SARS-CoV-2 by FDA under an Emergency Use Authorization (EUA). This EUA will remain in effect (meaning this test can be used) for the duration of the COVID-19 declaration under Section 564(b)(1) of the Act, 21 U.S.C. section 360bbb-3(b)(1), unless the authorization is terminated or revoked.  Performed at [facility], [address] [place], [place] [address]      Radiology Studies: DG Pelvis Portable  Result Date: 09/03/2023 CLINICAL DATA:  Fall EXAM: PORTABLE PELVIS 1-2 VIEWS COMPARISON:  None Available. FINDINGS: There is no evidence of pelvic fracture or diastasis. No pelvic bone lesions are seen. IMPRESSION: Negative. Electronically Signed   By: Charlett Nose M.D.   On: 09/03/2023 18:20   DG Chest Portable 1 View  Result Date: 09/03/2023 CLINICAL DATA:  Fall EXAM: PORTABLE CHEST 1 VIEW COMPARISON:  05/25/2023 FINDINGS: The heart size and mediastinal  contours are within normal limits. Both lungs are clear. The visualized skeletal structures are unremarkable. IMPRESSION: No active disease. Electronically Signed   By: Charlett Nose M.D.   On: 09/03/2023 18:20   CT Cervical Spine Wo Contrast  Result Date: 09/03/2023 CLINICAL DATA:  Neck trauma (Age >= 65y), fall EXAM: CT CERVICAL SPINE WITHOUT CONTRAST TECHNIQUE: Multidetector CT imaging of the cervical spine was performed without intravenous contrast. Multiplanar CT image reconstructions were also generated. RADIATION DOSE REDUCTION: This exam was performed according to the departmental dose-optimization program which includes automated exposure control, adjustment of the mA and/or kV according to patient size and/or use of iterative reconstruction technique. COMPARISON:  None Available. FINDINGS: Alignment: No subluxation Skull base and vertebrae: No acute fracture. No primary bone lesion or focal pathologic process. Soft tissues and spinal canal: No prevertebral fluid or swelling. No visible canal hematoma. Disc levels: Diffuse degenerative disc disease with disc space narrowing and spurring. Bilateral degenerative facet disease, left greater than right. Upper chest: No acute findings Other: None IMPRESSION: Degenerative disc and facet disease. No acute bony abnormality. Electronically Signed   By: Charlett Nose M.D.   On: 09/03/2023 18:19   CT HEAD WO CONTRAST  Result Date: 09/03/2023 CLINICAL DATA:  Head trauma EXAM: CT HEAD WITHOUT CONTRAST TECHNIQUE: Contiguous axial images were obtained from the base of the skull through the vertex without intravenous contrast. RADIATION DOSE REDUCTION: This exam was performed according to the departmental dose-optimization program which includes automated exposure control, adjustment of the mA and/or kV according to patient size and/or use of iterative reconstruction technique. COMPARISON:  None Available. FINDINGS: Brain: There is atrophy and chronic small vessel  disease changes. No acute intracranial abnormality. Specifically, no hemorrhage, hydrocephalus, mass lesion, acute infarction, or significant intracranial injury. Vascular: No hyperdense vessel or unexpected calcification. Skull: No acute calvarial abnormality. Sinuses/Orbits: No acute findings Other: None IMPRESSION: Atrophy, chronic microvascular disease. No acute intracranial abnormality. Electronically Signed  By: Charlett Nose M.D.   On: 09/03/2023 18:18    Scheduled Meds:  amLODipine  10 mg Oral Daily   calcium carbonate  1 tablet Oral Q breakfast   dextromethorphan-guaiFENesin  1 tablet Oral BID   folic acid  1 mg Oral Daily   heparin  5,000 Units Subcutaneous Q8H   multivitamin with minerals  1 tablet Oral Daily   potassium chloride  40 mEq Oral Q4H   thiamine  100 mg Oral Daily   Or   thiamine  100 mg Intravenous Daily   Continuous Infusions:  ampicillin-sulbactam (UNASYN) IV     dextrose 5 % and 0.45 % NaCl 125 mL/hr at 09/04/23 0928   magnesium sulfate bolus IVPB       LOS: 0 days   Time spent: 35 minutes  Carollee Herter, DO  [facility]  09/04/2023, 11:42 AM

## 2023-09-04 NOTE — ED Notes (Signed)
Admitting provider at bedside.

## 2023-09-04 NOTE — Assessment & Plan Note (Signed)
pt vomited when EMS was at her home.  CXR negative. Procal is slightly elevated. WBC normal. Possible she has chemical pneumonitis. Remains on IV unasyn. repeat procal still 0.47.  will continue IV unasyn. Unclear if she has true infection or if elevated procalcitonin is from ketoacidosis. UA is negative.

## 2023-09-04 NOTE — ED Notes (Signed)
EDP at bedside d/t increase in RR

## 2023-09-04 NOTE — Assessment & Plan Note (Signed)
Likely due to being found on the ground for prolonged period of time. No AKI. No need to repeat CK.

## 2023-09-04 NOTE — Progress Notes (Signed)
A consult was received from an ED physician for Vancomycin and Cefepime per pharmacy dosing.  The patient's profile has been reviewed for ht/wt/allergies/indication/available labs.    A one time order has been placed for Vancomycin 1gm IV and Cefepime 2gm IV.    Further antibiotics/pharmacy consults should be ordered by admitting physician if indicated.                       Thank you, Maryellen Pile, PharmD 09/04/2023  12:12 AM

## 2023-09-04 NOTE — Assessment & Plan Note (Signed)
Improved. Resolved with IVF.

## 2023-09-04 NOTE — Assessment & Plan Note (Signed)
Stable. No tetany.

## 2023-09-04 NOTE — Assessment & Plan Note (Addendum)
Likely due to etoh use. Repeat CMP in AM. Check INR in AM. Will calculate Maddrey's function in AM.

## 2023-09-04 NOTE — Hospital Course (Addendum)
HPI: Stephanie Riley is a 72 y.o. female with medical history significant of hypertension, alcohol abuse who presents to the emergency department via EMS due to alcohol intoxication.  Patient was somnolent, though easily arousable, she was unable to provide a history, history was provided by ex-husband at bedside and ED physician.  Per report, couple separated after 44 years of marriage due to patient's persistent history of alcohol abuse per husband.  He last saw her on Saturday and she was in her normal state of health.  Since he did not hear from her for some time, he went to check on her around 10:30 AM to 11 AM yesterday and she was found on the ground covered in urine, next to her was an empty big bottle of vodka.  Per ex-husband, patient's checking account was checked and it was noted that she has been buying half gallon bottles of water recently from [facility] store.  EMS was activated, and on arrival of EMS team, patient was agitated, she had nausea and vomiting while laying in a supine position (per husband).  It was thought that she may have fallen down some stairs, Zofran and Haldol were given.  She was sent to the ED for further evaluation and management.   Significant Events: Admitted 09/03/2023 for acute etoh intoxication, alcoholic ketoacidosis and fall   Significant Labs: Admitting ETOH 215 Admitting Covid PCR POSITIVE  Significant Imaging Studies: Admitting CT head showed Atrophy, chronic microvascular disease. No acute intracranial abnormality Admitting CT c-spine showed Degenerative disc and facet disease. No acute bony abnormality Admitting CXR shows no active disease Admitting pelvic XR showed no acute fractures  Antibiotic Therapy: Anti-infectives (From admission, onward)    Start     Dose/Rate Route Frequency Ordered Stop   09/04/23 0800  Ampicillin-Sulbactam (UNASYN) 3 g in sodium chloride 0.9 % 100 mL IVPB        3 g 200 mL/hr over 30 Minutes Intravenous Every 6 hours  09/04/23 0120     09/04/23 0000  ceFEPIme (MAXIPIME) 2 g in sodium chloride 0.9 % 100 mL IVPB        2 g 200 mL/hr over 30 Minutes Intravenous  Once 09/03/23 2345 09/04/23 0054   09/04/23 0000  metroNIDAZOLE (FLAGYL) IVPB 500 mg        500 mg 100 mL/hr over 60 Minutes Intravenous  Once 09/03/23 2345 09/04/23 0118   09/04/23 0000  vancomycin (VANCOCIN) IVPB 1000 mg/200 mL premix        1,000 mg 200 mL/hr over 60 Minutes Intravenous  Once 09/03/23 2345 09/04/23 0119       Procedures:   Consultants:

## 2023-09-04 NOTE — Plan of Care (Signed)
  Problem: Clinical Measurements: Goal: Diagnostic test results will improve Outcome: Progressing   Problem: Activity: Goal: Risk for activity intolerance will decrease Outcome: Progressing   Problem: Nutrition: Goal: Adequate nutrition will be maintained Outcome: Progressing   Problem: Pain Managment: Goal: General experience of comfort will improve Outcome: Progressing   Problem: Safety: Goal: Ability to remain free from injury will improve Outcome: Progressing   

## 2023-09-04 NOTE — Assessment & Plan Note (Signed)
Admitted for alcoholic ketoacidosis. Resolved after IVF.

## 2023-09-04 NOTE — Assessment & Plan Note (Signed)
No evidence of pneumonia. Continue with IV unasyn. WBC 3.1, but procalcitonin still elevated at 0.47.  continue with IV unasyn.

## 2023-09-04 NOTE — Assessment & Plan Note (Signed)
Continue with repletion. Repeat BMP in AM.

## 2023-09-04 NOTE — H&P (Addendum)
History and Physical    Patient: Stephanie Riley OZH:[id number] DOB: 11-Mar-1951 DOA: 09/03/2023 DOS: the patient was seen and examined on 09/04/2023 PCP: Robby Sermon, DO  Patient coming from: Home  Chief Complaint:  Chief Complaint  Patient presents with   Fall   Nausea   Alcohol Intoxication   HPI: Stephanie Riley is a 72 y.o. female with medical history significant of hypertension, alcohol abuse who presents to the emergency department via EMS due to alcohol intoxication.  Patient was somnolent, though easily arousable, she was unable to provide a history, history was provided by ex-husband at bedside and ED physician.  Per report, couple separated after 44 years of marriage due to patient's persistent history of alcohol abuse per husband.  He last saw her on Saturday and she was in her normal state of health.  Since he did not hear from her for some time, he went to check on her around 10:30 AM to 11 AM yesterday and she was found on the ground covered in urine, next to her was an empty big bottle of vodka.  Per ex-husband, patient's checking account was checked and it was noted that she has been buying half gallon bottles of water recently from [facility] store.  EMS was activated, and on arrival of EMS team, patient was agitated, she had nausea and vomiting while laying in a supine position (per husband).  It was thought that she may have fallen down some stairs, Zofran and Haldol were given.  She was sent to the ED for further evaluation and management.  ED Course:  In the emergency department, temperature was 97.58F, BP was 140/87 and other vital signs were within normal range on arrival to the ED.  Few hours after arrival to the ED patient became febrile with a temperature of 100.5 F, tachypneic, tachycardic.  Workup in the ED showed normocytic anemia, leukocytosis with WBC of 14.0.  BMP was normal except for sodium of 134, potassium 3.0, bicarb 8, calcium 7.6, AST 185, ALT 81,  total bilirubin 1.8, anion gap 28.  Alcohol level 215, acetaminophen less than 10, salicylate less than 7, total CK5 175, lactic acid 2.9, urinalysis was positive for ketonuria and proteinuria.  SARS coronavirus 2 was positive, influenza A, B, RSV was negative. Pelvic x-ray showed no evidence of pelvic fracture Chest x-ray showed no active disease CT cervical spine without contrast showed degenerative disc and facet disease, but no acute bony abnormality CT head without contrast showed no acute intracranial abnormality Patient was empirically started on IV cefepime, Flagyl and vancomycin due to presumed sepsis.  Librium was given and patient was placed on CIWA protocol.  IV hydration of 2 L of LR was provided and ibuprofen 800 mg was given.  Potassium was replenished.  Hospitalist was asked to admit patient for further evaluation and management.   Review of Systems: Review of systems as noted in the HPI. All other systems reviewed and are negative.   Past Medical History:  Diagnosis Date   Alcohol abuse    HTN (hypertension)    History reviewed. No pertinent surgical history.  Social History:  reports that she has never smoked. She has never used smokeless tobacco. She reports current alcohol use. She reports that she does not use drugs.   No Known Allergies  History reviewed. No pertinent family history.    Prior to Admission medications   Medication Sig Start Date End Date Taking? Authorizing Provider  chlordiazePOXIDE (LIBRIUM) 25 MG capsule 50mg   PO TID x 1D, then 25-50mg  PO BID X 1D, then 25-50mg  PO QD X 1D 09/03/23  Yes Rondel Baton, MD  sertraline (ZOLOFT) 100 MG tablet Take 1 tablet (100 mg total) by mouth daily. 07/13/23   [name], MD  amLODipine (NORVASC) 10 MG tablet Take 1 tablet (10 mg total) by mouth daily. 05/28/23 06/27/23  Lamar Sprinkles, MD  losartan-hydrochlorothiazide (HYZAAR) 100-12.5 MG tablet Take 1 tablet by mouth daily. 05/28/23 06/27/23  Lamar Sprinkles, MD  mirtazapine (REMERON) 7.5 MG tablet Take 1 tablet (7.5 mg total) by mouth at bedtime. 08/11/23   [name], MD  traZODone (DESYREL) 100 MG tablet Take 1 tablet (100 mg total) by mouth at bedtime as needed for sleep. 07/13/23   [name], MD    Physical Exam: BP 126/69   Pulse (!) 102   Temp (!) 100.5 F (38.1 C) (Rectal)   Resp (!) 25   SpO2 96%   General: 72 y.o. year-old female ill appearing, but in no acute distress.  Somnolent, though easily arousable HEENT: NCAT, PERRL Neck: Supple, trachea medial Cardiovascular: Tachycardia.  Regular rate and rhythm with no rubs or gallops.  No thyromegaly or JVD noted.  No lower extremity edema. 2/4 pulses in all 4 extremities. Respiratory: Tachypnea.  Clear to auscultation with no wheezes or rales. Good inspiratory effort. Abdomen: Soft, nontender nondistended with normal bowel sounds x4 quadrants. Muskuloskeletal: No cyanosis, clubbing or edema noted bilaterally Neuro: Patient was moving all 4 extremities.  No focal neurologic deficit.  Sensation, reflexes intact Skin: No ulcerative lesions noted or rashes Psychiatry: Mood is appropriate for condition and setting          Labs on Admission:  Basic Metabolic Panel: Recent Labs  Lab 09/03/23 1730  NA 134*  K 3.0*  CL 98  CO2 8*  GLUCOSE 80  BUN 10  CREATININE 0.58  CALCIUM 7.6*  MG 2.1   Liver Function Tests: Recent Labs  Lab 09/03/23 1730  AST 185*  ALT 81*  ALKPHOS 77  BILITOT 1.8*  PROT 7.1  ALBUMIN 3.9   No results for input(s): "LIPASE", "AMYLASE" in the last 168 hours. No results for input(s): "AMMONIA" in the last 168 hours. CBC: Recent Labs  Lab 09/03/23 1730  WBC 14.0*  NEUTROABS 12.0*  HGB 11.6*  HCT 36.3  MCV 96.3  PLT 225   Cardiac Enzymes: Recent Labs  Lab 09/03/23 1730  CKTOTAL 575*    BNP (last 3 results) No results for input(s): "BNP" in the last 8760 hours.  ProBNP (last 3 results) No results for input(s):  "PROBNP" in the last 8760 hours.  CBG: Recent Labs  Lab 09/03/23 1725  GLUCAP 81    Radiological Exams on Admission: DG Pelvis Portable  Result Date: 09/03/2023 CLINICAL DATA:  Fall EXAM: PORTABLE PELVIS 1-2 VIEWS COMPARISON:  None Available. FINDINGS: There is no evidence of pelvic fracture or diastasis. No pelvic bone lesions are seen. IMPRESSION: Negative. Electronically Signed   By: Charlett Nose M.D.   On: 09/03/2023 18:20   DG Chest Portable 1 View  Result Date: 09/03/2023 CLINICAL DATA:  Fall EXAM: PORTABLE CHEST 1 VIEW COMPARISON:  05/25/2023 FINDINGS: The heart size and mediastinal contours are within normal limits. Both lungs are clear. The visualized skeletal structures are unremarkable. IMPRESSION: No active disease. Electronically Signed   By: Charlett Nose M.D.   On: 09/03/2023 18:20   CT Cervical Spine Wo Contrast  Result Date: 09/03/2023 CLINICAL DATA:  Neck trauma (Age >= 65y), fall EXAM: CT CERVICAL SPINE WITHOUT CONTRAST TECHNIQUE: Multidetector CT imaging of the cervical spine was performed without intravenous contrast. Multiplanar CT image reconstructions were also generated. RADIATION DOSE REDUCTION: This exam was performed according to the departmental dose-optimization program which includes automated exposure control, adjustment of the mA and/or kV according to patient size and/or use of iterative reconstruction technique. COMPARISON:  None Available. FINDINGS: Alignment: No subluxation Skull base and vertebrae: No acute fracture. No primary bone lesion or focal pathologic process. Soft tissues and spinal canal: No prevertebral fluid or swelling. No visible canal hematoma. Disc levels: Diffuse degenerative disc disease with disc space narrowing and spurring. Bilateral degenerative facet disease, left greater than right. Upper chest: No acute findings Other: None IMPRESSION: Degenerative disc and facet disease. No acute bony abnormality. Electronically Signed   By: Charlett Nose M.D.   On: 09/03/2023 18:19   CT HEAD WO CONTRAST  Result Date: 09/03/2023 CLINICAL DATA:  Head trauma EXAM: CT HEAD WITHOUT CONTRAST TECHNIQUE: Contiguous axial images were obtained from the base of the skull through the vertex without intravenous contrast. RADIATION DOSE REDUCTION: This exam was performed according to the departmental dose-optimization program which includes automated exposure control, adjustment of the mA and/or kV according to patient size and/or use of iterative reconstruction technique. COMPARISON:  None Available. FINDINGS: Brain: There is atrophy and chronic small vessel disease changes. No acute intracranial abnormality. Specifically, no hemorrhage, hydrocephalus, mass lesion, acute infarction, or significant intracranial injury. Vascular: No hyperdense vessel or unexpected calcification. Skull: No acute calvarial abnormality. Sinuses/Orbits: No acute findings Other: None IMPRESSION: Atrophy, chronic microvascular disease. No acute intracranial abnormality. Electronically Signed   By: Charlett Nose M.D.   On: 09/03/2023 18:18    EKG: I independently viewed the EKG done and my findings are as followed: Sinus tachycardia at rate of 111 bpm  Assessment/Plan Present on Admission:  Alcoholic ketoacidosis  Alcohol use disorder  Principal Problem:   Alcoholic ketoacidosis Active Problems:   Alcohol use disorder   Lactic acidosis   SIRS (systemic inflammatory response syndrome) (HCC)   Transaminitis   Elevated CK   Hypokalemia   Hypocalcemia   COVID-19 virus infection   Essential hypertension  Alcoholic ketoacidosis High anion gap metabolic acidosis Patient presents with bicarb of 8, anion gap 28 Urinalysis was positive for ketonuria Patient has no history of type 2 diabetes mellitus This is consistent with alcoholic ketoacidosis Continue D5 half-normal saline Continue serial BMPs and monitor for improved bicarb and anion gap closure  Lactic acidosis possibly  secondary to above Lactic acid 2.9, continue treatment as per above Continue to trend lactic acid  SIRS with suspicion for sepsis, r/o aspiration pneumonia vs chemical pneumonitis Patient was tachycardic, tachypneic, febrile and have WBC of 14.0 (met SIRS criteria) Chest x-ray showed no active disease, but patient was said to have vomited while in supine position, there is a possibility of aspiration with may be chemical pneumonitis/aspiration pneumonia Patient was empirically treated with IV vancomycin, cefepime and Flagyl.  We shall continue with IV Unasyn with plan to discontinue/de-escalate based on blood culture, sputum culture, urine Legionella, strep pneumo and procalcitonin. Continue Mucinex, incentive spirometry, flutter valve   Alcohol use disorder Alcohol level was 215 Continue Librium taper , CIWA protocol, thiamine and multivitamin Continue fall precaution and neuro checks Patient will be counseled on alcohol withdrawal cessation when more stable  Transaminitis due to alcohol intoxication AST 185, ALT 81 Continue management as described for alcohol  intoxication Continue to monitor liver enzymes  Elevated CK level Total CK 525, this is possibly due to patient being on the floor for unknown hours Continue IV hydration Continue to monitor CK level  Hypokalemia K+ 3.0, this was replenished  Hypocalcemia Calcium 7.6, continue Os-Cal  COVID-19 virus infection SARS coronavirus 2 was positive Patient presents with no hypoxia Continue symptomatic treatment  Essential hypertension (controlled) Continue Norvasc, Hyzaar   DVT prophylaxis: Heparin subcu  Advance Care Planning: CODE STATUS: Full code  Consults: None  Family Communication: Ex-husband at bedside (all questions answered to satisfaction)  Severity of Illness: The appropriate patient status for this patient is INPATIENT. Inpatient status is judged to be reasonable and necessary in order to provide the  required intensity of service to ensure the patient's safety. The patient's presenting symptoms, physical exam findings, and initial radiographic and laboratory data in the context of their chronic comorbidities is felt to place them at high risk for further clinical deterioration. Furthermore, it is not anticipated that the patient will be medically stable for discharge from the hospital within 2 midnights of admission.   * I certify that at the point of admission it is my clinical judgment that the patient will require inpatient hospital care spanning beyond 2 midnights from the point of admission due to high intensity of service, high risk for further deterioration and high frequency of surveillance required.*  Author: Frankey Shown, DO 09/04/2023 1:43 AM  For on call review [email or web address].

## 2023-09-04 NOTE — Assessment & Plan Note (Signed)
Incidentally noted. Not hypoxic. Hold on any treatment for now.

## 2023-09-05 DIAGNOSIS — F1090 Alcohol use, unspecified, uncomplicated: Secondary | ICD-10-CM | POA: Diagnosis not present

## 2023-09-05 DIAGNOSIS — U071 COVID-19: Secondary | ICD-10-CM | POA: Diagnosis not present

## 2023-09-05 DIAGNOSIS — T17908A Unspecified foreign body in respiratory tract, part unspecified causing other injury, initial encounter: Secondary | ICD-10-CM | POA: Diagnosis not present

## 2023-09-05 DIAGNOSIS — E8729 Other acidosis: Secondary | ICD-10-CM | POA: Diagnosis not present

## 2023-09-05 LAB — CBC WITH DIFFERENTIAL/PLATELET
Abs Immature Granulocytes: 0.03 10*3/uL (ref 0.00–0.07)
Basophils Absolute: 0 10*3/uL (ref 0.0–0.1)
Basophils Relative: 1 %
Eosinophils Absolute: 0.1 10*3/uL (ref 0.0–0.5)
Eosinophils Relative: 2 %
HCT: 29.3 % — ABNORMAL LOW (ref 36.0–46.0)
Hemoglobin: 9.9 g/dL — ABNORMAL LOW (ref 12.0–15.0)
Immature Granulocytes: 1 %
Lymphocytes Relative: 19 %
Lymphs Abs: 0.6 10*3/uL — ABNORMAL LOW (ref 0.7–4.0)
MCH: 30.7 pg (ref 26.0–34.0)
MCHC: 33.8 g/dL (ref 30.0–36.0)
MCV: 90.7 fL (ref 80.0–100.0)
Monocytes Absolute: 0.2 10*3/uL (ref 0.1–1.0)
Monocytes Relative: 6 %
Neutro Abs: 2.2 10*3/uL (ref 1.7–7.7)
Neutrophils Relative %: 71 %
Platelets: 122 10*3/uL — ABNORMAL LOW (ref 150–400)
RBC: 3.23 MIL/uL — ABNORMAL LOW (ref 3.87–5.11)
RDW: 18.8 % — ABNORMAL HIGH (ref 11.5–15.5)
WBC: 3.1 10*3/uL — ABNORMAL LOW (ref 4.0–10.5)
nRBC: 0 % (ref 0.0–0.2)

## 2023-09-05 LAB — POTASSIUM: Potassium: 3 mmol/L — ABNORMAL LOW (ref 3.5–5.1)

## 2023-09-05 LAB — COMPREHENSIVE METABOLIC PANEL
ALT: 53 U/L — ABNORMAL HIGH (ref 0–44)
AST: 90 U/L — ABNORMAL HIGH (ref 15–41)
Albumin: 3 g/dL — ABNORMAL LOW (ref 3.5–5.0)
Alkaline Phosphatase: 56 U/L (ref 38–126)
Anion gap: 6 (ref 5–15)
BUN: <5 mg/dL — ABNORMAL LOW (ref 8–23)
CO2: 25 mmol/L (ref 22–32)
Calcium: 8.6 mg/dL — ABNORMAL LOW (ref 8.9–10.3)
Chloride: 105 mmol/L (ref 98–111)
Creatinine, Ser: 0.41 mg/dL — ABNORMAL LOW (ref 0.44–1.00)
GFR, Estimated: >60 mL/min (ref >60)
Glucose, Bld: 134 mg/dL — ABNORMAL HIGH (ref 70–99)
Potassium: 2.7 mmol/L — CL (ref 3.5–5.1)
Sodium: 136 mmol/L (ref 135–145)
Total Bilirubin: 1.1 mg/dL (ref 0.3–1.2)
Total Protein: 5.4 g/dL — ABNORMAL LOW (ref 6.5–8.1)

## 2023-09-05 LAB — MAGNESIUM: Magnesium: 2.2 mg/dL (ref 1.7–2.4)

## 2023-09-05 LAB — PHOSPHORUS: Phosphorus: <1 mg/dL — CL (ref 2.5–4.6)

## 2023-09-05 LAB — PROCALCITONIN: Procalcitonin: 0.48 ng/mL

## 2023-09-05 LAB — PROTIME-INR
INR: 1.1 (ref 0.8–1.2)
Prothrombin Time: 13.9 s (ref 11.4–15.2)

## 2023-09-05 MED ORDER — POTASSIUM CHLORIDE CRYS ER 20 MEQ PO TBCR
40.0000 meq | EXTENDED_RELEASE_TABLET | Freq: Once | ORAL | Status: AC
  Administered 2023-09-05: 40 meq via ORAL
  Filled 2023-09-05: qty 2

## 2023-09-05 MED ORDER — POTASSIUM CHLORIDE 10 MEQ/100ML IV SOLN
10.0000 meq | INTRAVENOUS | Status: AC
  Administered 2023-09-05 (×4): 10 meq via INTRAVENOUS
  Filled 2023-09-05 (×3): qty 100

## 2023-09-05 MED ORDER — SODIUM PHOSPHATES 45 MMOLE/15ML IV SOLN
30.0000 mmol | Freq: Once | INTRAVENOUS | Status: AC
  Administered 2023-09-05: 30 mmol via INTRAVENOUS
  Filled 2023-09-05: qty 10

## 2023-09-05 NOTE — Progress Notes (Signed)
   Spoke with the patient's husband Stephanie Riley.  He and the patient are separated.  He is an Pensions consultant in [employer].  He states that in July 2023, he and his daughter Stephanie Riley (who lives in [place]) had an intervention with the patient regarding her alcohol use.  Family had requested that she go into inpatient residential treatment for alcohol use.  She refused.  Husband and wife then separated.  Husband understands that patient is an alcoholic.  He states that the patient continues to deny her alcohol abuse.  Patient clearly cannot care for herself.  He is pursuing guardianship of the patient through a nonprofit organization.  He still plans on being involved in her care but does not want to be legally responsible as her guardian.  He states that when he found her, she was laying in the living room.  She was on the floor and urine soaked close and a urine soaked carpet along with urine soaked blankets.  Unclear how long she had been there.  He states that there is a half a gallon glass vodka bottle laying by her side that was empty.  He is unsure what is going to happen to her after she is discharged from the hospital but is clear that she cannot return back to her house due to the patient's inability to care for herself.  Will pursue physical and Occupational Therapy.  Maybe there is a chance she will need some rehab.  He will continue with the legal guardianship attempt.  He understands this may take several weeks to several months to officially be approved.  He still however is her legal spouse.  Carollee Herter, DO [facility]

## 2023-09-05 NOTE — Assessment & Plan Note (Addendum)
Getting IV phos replacement. Repeat phos level in AM. Should improve when pt starts eating.

## 2023-09-05 NOTE — Progress Notes (Signed)
PT Cancellation Note  Patient Details Name: Stephanie Riley MRN: [id number] DOB: 11-13-1951   Cancelled Treatment:    Reason Eval/Treat Not Completed: Other (comment) Order received then discontinued.  New order to start PT tomorrow.  Will check back on pt per orders.  Thank you.   Janan Halter [place] 09/05/2023, 11:30 AM Paulino Door, DPT Physical Therapist Acute [facility] Services Office: [phone]

## 2023-09-05 NOTE — Progress Notes (Addendum)
       CROSS COVER NOTE  NAME: Stephanie Riley MRN: [id number] DOB : Apr 02, 1951    Concern as stated by nurse / staff   Chat message received from RN hi, received critical lab value for this pt. potassium is 2.7. pt admit for alcoholic ketoacidosis. on 9/28 potassium is 2.8, day shift given 40 mEq potassium PO. thanks.      Pertinent findings on chart review:   Assessment and  Interventions   Assessment:    09/05/2023    1:31 AM 09/04/2023    9:04 PM 09/04/2023    6:36 PM  Vitals with BMI  Systolic 124 120 782  Diastolic 62 71 73  Pulse 87 85 80       Latest Ref Rng & Units 09/05/2023    3:45 AM 09/04/2023    9:58 AM 09/04/2023    4:17 AM  BMP  Glucose 70 - 99 mg/dL 956  213  086   BUN 8 - 23 mg/dL 5  7  9    Creatinine 0.44 - 1.00 mg/dL 5.78  4.69  6.29   Sodium 135 - 145 mmol/L 136  134  130   Potassium 3.5 - 5.1 mmol/L 2.7  2.8  3.1   Chloride 98 - 111 mmol/L 105  102  98   CO2 22 - 32 mmol/L 25  17  14    Calcium 8.9 - 10.3 mg/dL 8.6  8.3  7.7    Mag 2.2 9/28 phos level 1.3 - does not appear to have been supplemented with MAR review Plan: Potassium 40 oral 40 IV Add phos level to blood in lab - result <1 - 30 mmol sodium phosphate ordered Recheck K at 1400 today       Donnie Mesa NP [facility] Cross Cover 7pm-7am - check amion for availability Pager [phone]

## 2023-09-05 NOTE — Progress Notes (Signed)
PROGRESS NOTE    Stephanie Riley  ZHY:[id number] DOB: [birth year] DOA: 09/03/2023 PCP: Robby Sermon, DO  Subjective: Pt seen and examined. Admitted for acute etoh intoxication and alcoholic ketoacidosis.   No prn ativan/librium uses yesterday or today.  Will need CM to see patient to start DC planning. Not sure where pt is going to go after hospitalization.   Hospital Course: HPI: Stephanie Riley is a 72 y.o. female with medical history significant of hypertension, alcohol abuse who presents to the emergency department via EMS due to alcohol intoxication.  Patient was somnolent, though easily arousable, she was unable to provide a history, history was provided by ex-husband at bedside and ED physician.  Per report, couple separated after 44 years of marriage due to patient's persistent history of alcohol abuse per husband.  He last saw her on Saturday and she was in her normal state of health.  Since he did not hear from her for some time, he went to check on her around 10:30 AM to 11 AM yesterday and she was found on the ground covered in urine, next to her was an empty big bottle of vodka.  Per ex-husband, patient's checking account was checked and it was noted that she has been buying half gallon bottles of water recently from [facility] store.  EMS was activated, and on arrival of EMS team, patient was agitated, she had nausea and vomiting while laying in a supine position (per husband).  It was thought that she may have fallen down some stairs, Zofran and Haldol were given.  She was sent to the ED for further evaluation and management.   Significant Events: Admitted 09/03/2023 for acute etoh intoxication, alcoholic ketoacidosis and fall   Significant Labs: Admitting ETOH 215 Admitting Covid PCR POSITIVE  Significant Imaging Studies: Admitting CT head showed Atrophy, chronic microvascular disease. No acute intracranial abnormality Admitting CT c-spine showed Degenerative  disc and facet disease. No acute bony abnormality Admitting CXR shows no active disease Admitting pelvic XR showed no acute fractures  Antibiotic Therapy: Anti-infectives (From admission, onward)    Start     Dose/Rate Route Frequency Ordered Stop   09/04/23 0800  Ampicillin-Sulbactam (UNASYN) 3 g in sodium chloride 0.9 % 100 mL IVPB        3 g 200 mL/hr over 30 Minutes Intravenous Every 6 hours 09/04/23 0120     09/04/23 0000  ceFEPIme (MAXIPIME) 2 g in sodium chloride 0.9 % 100 mL IVPB        2 g 200 mL/hr over 30 Minutes Intravenous  Once 09/03/23 2345 09/04/23 0054   09/04/23 0000  metroNIDAZOLE (FLAGYL) IVPB 500 mg        500 mg 100 mL/hr over 60 Minutes Intravenous  Once 09/03/23 2345 09/04/23 0118   09/04/23 0000  vancomycin (VANCOCIN) IVPB 1000 mg/200 mL premix        1,000 mg 200 mL/hr over 60 Minutes Intravenous  Once 09/03/23 2345 09/04/23 0119       Procedures:   Consultants:     Assessment and Plan: * Alcoholic ketoacidosis Admitted for alcoholic ketoacidosis. Resolved after IVF.  Aspiration into airway No evidence of pneumonia. Continue with IV unasyn. WBC 3.1, but procalcitonin still elevated at 0.47.  continue with IV unasyn.  COVID-19 virus infection Incidentally noted. Not hypoxic. Hold on any treatment for now.  Transaminitis Likely due to etoh use. Improving LFTs.  INR 1.1 Maddrey's function 0.6. no indication for steroids.  SIRS (systemic inflammatory response  syndrome) (HCC) pt vomited when EMS was at her home.  CXR negative. Procal is slightly elevated. WBC normal. Possible she has chemical pneumonitis. Remains on IV unasyn. repeat procal still 0.47.  will continue IV unasyn. Unclear if she has true infection or if elevated procalcitonin is from ketoacidosis. UA is negative.  Essential hypertension Hold ARB/hydrochlorothiazide due to poor po intake.  Hypocalcemia Stable. No tetany.  Hypokalemia Continue with repletion. Repeat BMP in  AM.  Elevated CK Likely due to being found on the ground for prolonged period of time. No AKI. No need to repeat CK.  Lactic acidosis Improved. Resolved with IVF.  Alcohol use disorder On CIWA protocol. Has not required any prn librium/ativan in last 36 hours.  Hypophosphatemia Getting IV phos replacement. Repeat phos level in AM. Should improve when pt starts eating.   DVT prophylaxis: heparin injection 5,000 Units Start: 09/04/23 0600 SCDs Start: 09/04/23 0122    Code Status: Full Code Family Communication: called pt's husband Richarg Furth at [phone] Disposition Plan: return home Reason for continuing need for hospitalization: replacement of potassium and phosphorus  Objective: Vitals:   09/04/23 2104 09/05/23 0131 09/05/23 0613 09/05/23 1000  BP: 120/71 124/62 125/65 132/81  Pulse: 85 87 91 96  Resp: 20 18 16 14   Temp: (!) 97.5 F (36.4 C) 98.3 F (36.8 C) 98.1 F (36.7 C) 97.8 F (36.6 C)  TempSrc: Oral Oral Oral Oral  SpO2: 98% 98% 99% 99%    Intake/Output Summary (Last 24 hours) at 09/05/2023 1056 Last data filed at 09/05/2023 1007 Gross per 24 hour  Intake 3461.84 ml  Output 700 ml  Net 2761.84 ml   There were no vitals filed for this visit.  Examination:  Physical Exam Vitals and nursing note reviewed.  Constitutional:      General: She is not in acute distress.    Appearance: She is not toxic-appearing or diaphoretic.     Comments: Chronically ill appearing  HENT:     Head: Normocephalic and atraumatic.  Eyes:     General: No scleral icterus. Cardiovascular:     Rate and Rhythm: Normal rate and regular rhythm.  Pulmonary:     Effort: Pulmonary effort is normal. No respiratory distress.  Abdominal:     General: Bowel sounds are normal. There is no distension.  Musculoskeletal:     Right lower leg: No edema.     Left lower leg: No edema.  Skin:    General: Skin is warm and dry.     Capillary Refill: Capillary refill takes less than 2  seconds.  Neurological:     Mental Status: She is alert and oriented to person, place, and time.    Data Reviewed: I have personally reviewed following labs and imaging studies  CBC: Recent Labs  Lab 09/03/23 1730 09/04/23 0417 09/05/23 0345  WBC 14.0* 5.7 3.1*  NEUTROABS 12.0*  --  2.2  HGB 11.6* 9.7* 9.9*  HCT 36.3 29.8* 29.3*  MCV 96.3 94.6 90.7  PLT 225 135* 122*   Basic Metabolic Panel: Recent Labs  Lab 09/03/23 1730 09/04/23 0417 09/04/23 0958 09/05/23 0345  NA 134* 130* 134* 136  K 3.0* 3.1* 2.8* 2.7*  CL 98 98 102 105  CO2 8* 14* 17* 25  GLUCOSE 80 137* 120* 134*  BUN 10 9 7* <5*  CREATININE 0.58 0.76 0.55 0.41*  CALCIUM 7.6* 7.7* 8.3* 8.6*  MG 2.1 1.7  --  2.2  PHOS  --  1.3*  --  <  1.0*   GFR: CrCl cannot be calculated (Unknown ideal weight.). Liver Function Tests: Recent Labs  Lab 09/03/23 1730 09/04/23 0417 09/05/23 0345  AST 185* 137* 90*  ALT 81* 64* 53*  ALKPHOS 77 62 56  BILITOT 1.8* 2.2* 1.1  PROT 7.1 5.5* 5.4*  ALBUMIN 3.9 3.0* 3.0*   Coagulation Profile: Recent Labs  Lab 09/05/23 0345  INR 1.1   Cardiac Enzymes: Recent Labs  Lab 09/03/23 1730 09/04/23 0417  CKTOTAL 575* 373*   CBG: Recent Labs  Lab 09/03/23 1725  GLUCAP 81   Sepsis Labs: Recent Labs  Lab 09/03/23 2334 09/04/23 0417 09/05/23 0345  PROCALCITON  --  0.57 0.48  LATICACIDVEN 2.9* 0.8  --     Recent Results (from the past 240 hour(s))  Blood culture (routine x 2)     Status: None (Preliminary result)   Collection Time: 09/03/23 11:33 PM   Specimen: BLOOD RIGHT FOREARM  Result Value Ref Range Status   Specimen Description   Final    BLOOD RIGHT FOREARM Performed at [facility], [address] [place], [place] [address]    Special Requests   Final    BOTTLES DRAWN AEROBIC AND ANAEROBIC Blood Culture adequate volume Performed at [facility], [address] [place], [place] [address]    Culture   Final     NO GROWTH 1 DAY Performed at [facility], [address], [place], [place] [address]    Report Status PENDING  Incomplete  Blood culture (routine x 2)     Status: None (Preliminary result)   Collection Time: 09/03/23 11:38 PM   Specimen: BLOOD  Result Value Ref Range Status   Specimen Description   Final    BLOOD RIGHT ANTECUBITAL Performed at [facility], [address], [place], [place] [address]    Special Requests   Final    BOTTLES DRAWN AEROBIC AND ANAEROBIC Blood Culture results may not be optimal due to an excessive volume of blood received in culture bottles Performed at [facility], [address] [place], [place] [address]    Culture   Final    NO GROWTH 1 DAY Performed at [facility], [address] [place], [place] [address]    Report Status PENDING  Incomplete  Resp panel by RT-PCR (RSV, Flu A&B, Covid) Anterior Nasal Swab     Status: Abnormal   Collection Time: 09/03/23 11:51 PM   Specimen: Anterior Nasal Swab  Result Value Ref Range Status   SARS Coronavirus 2 by RT PCR POSITIVE (A) NEGATIVE Final    Comment: (NOTE) SARS-CoV-2 target nucleic acids are DETECTED.  The SARS-CoV-2 RNA is generally detectable in upper respiratory specimens during the acute phase of infection. Positive results are indicative of the presence of the identified virus, but do not rule out bacterial infection or co-infection with other pathogens not detected by the test. Clinical correlation with patient history and other diagnostic information is necessary to determine patient infection status. The expected result is Negative.  Fact Sheet for Patients: BloggerCourse.com  Fact Sheet for Healthcare Providers: SeriousBroker.it  This test is not yet approved or cleared by the [place] FDA and  has been authorized for detection and/or diagnosis of SARS-CoV-2 by FDA under an Emergency Use  Authorization (EUA).  This EUA will remain in effect (meaning this test can be used) for the duration of  the COVID-19 declaration under Section 564(b)(1) of the A  ct, 21 U.S.C. section 360bbb-3(b)(1), unless the authorization is terminated or revoked sooner.     Influenza A by PCR NEGATIVE NEGATIVE Final   Influenza B by PCR NEGATIVE NEGATIVE Final    Comment: (NOTE) The Xpert Xpress SARS-CoV-2/FLU/RSV plus assay is intended as an aid in the diagnosis of influenza from Nasopharyngeal swab specimens and should not be used as a sole basis for treatment. Nasal washings and aspirates are unacceptable for Xpert Xpress SARS-CoV-2/FLU/RSV testing.  Fact Sheet for Patients: BloggerCourse.com  Fact Sheet for Healthcare Providers: SeriousBroker.it  This test is not yet approved or cleared by the [place] FDA and has been authorized for detection and/or diagnosis of SARS-CoV-2 by FDA under an Emergency Use Authorization (EUA). This EUA will remain in effect (meaning this test can be used) for the duration of the COVID-19 declaration under Section 564(b)(1) of the Act, 21 U.S.C. section 360bbb-3(b)(1), unless the authorization is terminated or revoked.     Resp Syncytial Virus by PCR NEGATIVE NEGATIVE Final    Comment: (NOTE) Fact Sheet for Patients: BloggerCourse.com  Fact Sheet for Healthcare Providers: SeriousBroker.it  This test is not yet approved or cleared by the [place] FDA and has been authorized for detection and/or diagnosis of SARS-CoV-2 by FDA under an Emergency Use Authorization (EUA). This EUA will remain in effect (meaning this test can be used) for the duration of the COVID-19 declaration under Section 564(b)(1) of the Act, 21 U.S.C. section 360bbb-3(b)(1), unless the authorization is terminated or revoked.  Performed at [facility], [address], [place], [place] [address]      Radiology Studies: DG Pelvis Portable  Result Date: 09/03/2023 CLINICAL DATA:  Fall EXAM: PORTABLE PELVIS 1-2 VIEWS COMPARISON:  None Available. FINDINGS: There is no evidence of pelvic fracture or diastasis. No pelvic bone lesions are seen. IMPRESSION: Negative. Electronically Signed   By: Charlett Nose M.D.   On: 09/03/2023 18:20   DG Chest Portable 1 View  Result Date: 09/03/2023 CLINICAL DATA:  Fall EXAM: PORTABLE CHEST 1 VIEW COMPARISON:  05/25/2023 FINDINGS: The heart size and mediastinal contours are within normal limits. Both lungs are clear. The visualized skeletal structures are unremarkable. IMPRESSION: No active disease. Electronically Signed   By: Charlett Nose M.D.   On: 09/03/2023 18:20   CT Cervical Spine Wo Contrast  Result Date: 09/03/2023 CLINICAL DATA:  Neck trauma (Age >= 65y), fall EXAM: CT CERVICAL SPINE WITHOUT CONTRAST TECHNIQUE: Multidetector CT imaging of the cervical spine was performed without intravenous contrast. Multiplanar CT image reconstructions were also generated. RADIATION DOSE REDUCTION: This exam was performed according to the departmental dose-optimization program which includes automated exposure control, adjustment of the mA and/or kV according to patient size and/or use of iterative reconstruction technique. COMPARISON:  None Available. FINDINGS: Alignment: No subluxation Skull base and vertebrae: No acute fracture. No primary bone lesion or focal pathologic process. Soft tissues and spinal canal: No prevertebral fluid or swelling. No visible canal hematoma. Disc levels: Diffuse degenerative disc disease with disc space narrowing and spurring. Bilateral degenerative facet disease, left greater than right. Upper chest: No acute findings Other: None IMPRESSION: Degenerative disc and facet disease. No acute bony abnormality. Electronically Signed   By: Charlett Nose M.D.   On: 09/03/2023 18:19   CT HEAD WO  CONTRAST  Result Date: 09/03/2023 CLINICAL DATA:  Head trauma EXAM: CT HEAD WITHOUT CONTRAST TECHNIQUE: Contiguous axial images were obtained from the base of the skull through the vertex without intravenous  contrast. RADIATION DOSE REDUCTION: This exam was performed according to the departmental dose-optimization program which includes automated exposure control, adjustment of the mA and/or kV according to patient size and/or use of iterative reconstruction technique. COMPARISON:  None Available. FINDINGS: Brain: There is atrophy and chronic small vessel disease changes. No acute intracranial abnormality. Specifically, no hemorrhage, hydrocephalus, mass lesion, acute infarction, or significant intracranial injury. Vascular: No hyperdense vessel or unexpected calcification. Skull: No acute calvarial abnormality. Sinuses/Orbits: No acute findings Other: None IMPRESSION: Atrophy, chronic microvascular disease. No acute intracranial abnormality. Electronically Signed   By: Charlett Nose M.D.   On: 09/03/2023 18:18    Scheduled Meds:  amLODipine  10 mg Oral Daily   calcium carbonate  1 tablet Oral Q breakfast   dextromethorphan-guaiFENesin  1 tablet Oral BID   folic acid  1 mg Oral Daily   heparin  5,000 Units Subcutaneous Q8H   multivitamin with minerals  1 tablet Oral Daily   thiamine  100 mg Oral Daily   Or   thiamine  100 mg Intravenous Daily   Continuous Infusions:  ampicillin-sulbactam (UNASYN) IV 3 g (09/05/23 0919)   sodium phosphate 30 mmol in dextrose 5 % 250 mL infusion 30 mmol (09/05/23 0915)     LOS: 1 day   Time spent: 35 minutes  Carollee Herter, DO  [facility]  09/05/2023, 10:56 AM

## 2023-09-06 DIAGNOSIS — E876 Hypokalemia: Secondary | ICD-10-CM | POA: Diagnosis not present

## 2023-09-06 DIAGNOSIS — E872 Acidosis, unspecified: Secondary | ICD-10-CM | POA: Diagnosis not present

## 2023-09-06 DIAGNOSIS — E8729 Other acidosis: Secondary | ICD-10-CM | POA: Diagnosis not present

## 2023-09-06 LAB — COMPREHENSIVE METABOLIC PANEL
ALT: 49 U/L — ABNORMAL HIGH (ref 0–44)
AST: 68 U/L — ABNORMAL HIGH (ref 15–41)
Albumin: 3 g/dL — ABNORMAL LOW (ref 3.5–5.0)
Alkaline Phosphatase: 61 U/L (ref 38–126)
Anion gap: 9 (ref 5–15)
BUN: <5 mg/dL — ABNORMAL LOW (ref 8–23)
CO2: 30 mmol/L (ref 22–32)
Calcium: 8.4 mg/dL — ABNORMAL LOW (ref 8.9–10.3)
Chloride: 100 mmol/L (ref 98–111)
Creatinine, Ser: 0.39 mg/dL — ABNORMAL LOW (ref 0.44–1.00)
GFR, Estimated: >60 mL/min (ref >60)
Glucose, Bld: 101 mg/dL — ABNORMAL HIGH (ref 70–99)
Potassium: 2.3 mmol/L — CL (ref 3.5–5.1)
Sodium: 139 mmol/L (ref 135–145)
Total Bilirubin: 0.8 mg/dL (ref 0.3–1.2)
Total Protein: 5.7 g/dL — ABNORMAL LOW (ref 6.5–8.1)

## 2023-09-06 LAB — RENAL FUNCTION PANEL
Albumin: 3.1 g/dL — ABNORMAL LOW (ref 3.5–5.0)
Anion gap: 11 (ref 5–15)
BUN: <5 mg/dL — ABNORMAL LOW (ref 8–23)
CO2: 28 mmol/L (ref 22–32)
Calcium: 8.4 mg/dL — ABNORMAL LOW (ref 8.9–10.3)
Chloride: 99 mmol/L (ref 98–111)
Creatinine, Ser: 0.37 mg/dL — ABNORMAL LOW (ref 0.44–1.00)
GFR, Estimated: >60 mL/min (ref >60)
Glucose, Bld: 97 mg/dL (ref 70–99)
Phosphorus: <1 mg/dL — CL (ref 2.5–4.6)
Potassium: 2.2 mmol/L — CL (ref 3.5–5.1)
Sodium: 138 mmol/L (ref 135–145)

## 2023-09-06 LAB — PHOSPHORUS: Phosphorus: <1 mg/dL — CL (ref 2.5–4.6)

## 2023-09-06 LAB — LEGIONELLA PNEUMOPHILA SEROGP 1 UR AG: L. pneumophila Serogp 1 Ur Ag: NEGATIVE

## 2023-09-06 LAB — MAGNESIUM: Magnesium: 1.7 mg/dL (ref 1.7–2.4)

## 2023-09-06 MED ORDER — POTASSIUM CHLORIDE CRYS ER 20 MEQ PO TBCR
40.0000 meq | EXTENDED_RELEASE_TABLET | Freq: Once | ORAL | Status: AC
  Administered 2023-09-06: 40 meq via ORAL
  Filled 2023-09-06: qty 2

## 2023-09-06 MED ORDER — K PHOS MONO-SOD PHOS DI & MONO 155-852-130 MG PO TABS
500.0000 mg | ORAL_TABLET | Freq: Three times a day (TID) | ORAL | Status: DC
  Administered 2023-09-06 (×3): 500 mg via ORAL
  Filled 2023-09-06 (×5): qty 2

## 2023-09-06 MED ORDER — POTASSIUM CHLORIDE 10 MEQ/100ML IV SOLN: 10.0000 meq | INTRAVENOUS | Status: DC

## 2023-09-06 MED ORDER — HYDROXYZINE HCL 25 MG PO TABS
25.0000 mg | ORAL_TABLET | ORAL | Status: AC | PRN
  Administered 2023-09-07 (×3): 25 mg via ORAL
  Filled 2023-09-06 (×3): qty 1

## 2023-09-06 MED ORDER — PROCHLORPERAZINE EDISYLATE 10 MG/2ML IJ SOLN
10.0000 mg | Freq: Once | INTRAMUSCULAR | Status: AC
  Administered 2023-09-06: 10 mg via INTRAVENOUS
  Filled 2023-09-06: qty 2

## 2023-09-06 MED ORDER — ENSURE ENLIVE PO LIQD
237.0000 mL | Freq: Two times a day (BID) | ORAL | Status: DC
  Administered 2023-09-06 – 2023-09-09 (×8): 237 mL via ORAL

## 2023-09-06 MED ORDER — POTASSIUM CHLORIDE CRYS ER 20 MEQ PO TBCR
40.0000 meq | EXTENDED_RELEASE_TABLET | Freq: Two times a day (BID) | ORAL | Status: AC
  Administered 2023-09-06 (×2): 40 meq via ORAL
  Filled 2023-09-06 (×2): qty 2

## 2023-09-06 MED ORDER — SODIUM PHOSPHATES 45 MMOLE/15ML IV SOLN
30.0000 mmol | Freq: Once | INTRAVENOUS | Status: AC
  Administered 2023-09-06: 30 mmol via INTRAVENOUS
  Filled 2023-09-06: qty 10

## 2023-09-06 MED ORDER — MAGNESIUM SULFATE IN D5W 1-5 GM/100ML-% IV SOLN
1.0000 g | Freq: Once | INTRAVENOUS | Status: AC
  Administered 2023-09-06: 1 g via INTRAVENOUS
  Filled 2023-09-06: qty 100

## 2023-09-06 NOTE — Progress Notes (Signed)
[facility] PROGRESS NOTE    Progress Note  Stephanie Riley  QMV:[id number] DOB: [birth year] DOA: 09/03/2023 PCP: Robby Sermon, DO     Brief Narrative:   Stephanie Riley is an 72 y.o. female past medical history significant for essential hypertension and alcohol abuse comes into the emergency room for alcohol intoxication, she separated after 44 years of marriage due to recurrent alcohol abuse, her husband called EMS who found her agitated and vomiting    Significant Events: 09/03/2023 admitted for acute intoxication/alcoholic ketoacidosis  Significant studies: COVID-positive CT of the head showed no acute findings chronic atrophic changes. CT of the C-spine showed no acute findings Chest x-ray showed no acute findings  Antibiotics: None  Microbiology data: None   Assessment/Plan:   Alcoholic ketoacidosis/SIRS alcohol disorder: Inebriated on admission started on IV fluids now resolved. Started on CIWA protocol does not require any intervention in the last 48 hours. PT OT to evaluate.  Aspiration into airway: No evidence of pneumonia started on IV Unasyn. Has remained afebrile leukocytosis has resolved.  On admission had a temperature of 105. Will complete a 5-day course of IV Unasyn.  Incidental COVID-19 viral infection Chest x-ray clear not hypoxic.  Elevated LFTs: Likely due to alcohol use, Madrey function score is low no indication for steroids. LFTs are trending down.  SIRS: Sepsis has been ruled out, started on IV Unasyn due to fevers, with no evidence of infection will complete a 5-day course.  Essential hypertension: Due to poor oral intake and unremarkable blood pressure we will continue to hold antihypertensive medication.  Severe hypokalemia: Replete orally recheck in the morning.  Hypocalcemia: Stable corrected for albumin is actually unremarkable.  Severe hypophosphatemia: Replete IV and orally.  Elevated CK  nontraumatic: Likely due to immobility, will start on IV fluids and admission.   DVT prophylaxis: lovenox Family Communication:Husband Status is: Inpatient Remains inpatient appropriate because: Ketosis now with electrolyte imbalance.    Code Status:     Code Status Orders  (From admission, onward)           Start     Ordered   09/04/23 0122  Full code  Continuous       Question:  By:  Answer:  Consent: discussion documented in EHR   09/04/23 0122           Code Status History     Date Active Date Inactive Code Status Order ID Comments User Context   05/25/2023 1652 05/29/2023 1202 Full Code [id number]  Sunday Corn, NP ED   03/19/2023 1439 03/26/2023 1512 Full Code [id number]  Lenard Lance, FNP ED   02/11/2023 1434 02/18/2023 0038 Full Code [id number]  Lamar Sprinkles, MD ED         IV Access:   Peripheral IV   Procedures and diagnostic studies:   No results found.   Medical Consultants:   None.   Subjective:    Stephanie Riley patient relates she will try to eat today.  Has had poor oral intake over the last 24 to 48 hours.  Objective:    Vitals:   09/05/23 2030 09/05/23 2210 09/06/23 0234 09/06/23 0614  BP:  (!) 132/95 123/74 (!) 137/93  Pulse: 98 81 91 92  Resp:  18 18 17   Temp:  98.3 F (36.8 C) 98.2 F (36.8 C) 98.2 F (36.8 C)  TempSrc:  Oral Oral Oral  SpO2:  98% 97% 96%   SpO2: 96 %   Intake/Output Summary (Last  24 hours) at 09/06/2023 0716 Last data filed at 09/06/2023 0630 Gross per 24 hour  Intake 1869.76 ml  Output --  Net 1869.76 ml   There were no vitals filed for this visit.  Exam: General exam: In no acute distress. Respiratory system: Good air movement and clear to auscultation. Cardiovascular system: S1 & S2 heard, RRR. No JVD. Gastrointestinal system: Abdomen is nondistended, soft and nontender.  Extremities: No pedal edema. Skin: No rashes, lesions or ulcers Psychiatry: Judgement and insight appear  normal. Mood & affect appropriate.    Data Reviewed:    Labs: Basic Metabolic Panel: Recent Labs  Lab 09/03/23 1730 09/04/23 0417 09/04/23 0958 09/05/23 0345 09/05/23 1420 09/06/23 0340  NA 134* 130* 134* 136  --  139  K 3.0* 3.1* 2.8* 2.7* 3.0* 2.3*  CL 98 98 102 105  --  100  CO2 8* 14* 17* 25  --  30  GLUCOSE 80 137* 120* 134*  --  101*  BUN 10 9 7* <5*  --  <5*  CREATININE 0.58 0.76 0.55 0.41*  --  0.39*  CALCIUM 7.6* 7.7* 8.3* 8.6*  --  8.4*  MG 2.1 1.7  --  2.2  --  1.7  PHOS  --  1.3*  --  <1.0*  --  <1.0*   GFR CrCl cannot be calculated (Unknown ideal weight.). Liver Function Tests: Recent Labs  Lab 09/03/23 1730 09/04/23 0417 09/05/23 0345 09/06/23 0340  AST 185* 137* 90* 68*  ALT 81* 64* 53* 49*  ALKPHOS 77 62 56 61  BILITOT 1.8* 2.2* 1.1 0.8  PROT 7.1 5.5* 5.4* 5.7*  ALBUMIN 3.9 3.0* 3.0* 3.0*   No results for input(s): "LIPASE", "AMYLASE" in the last 168 hours. No results for input(s): "AMMONIA" in the last 168 hours. Coagulation profile Recent Labs  Lab 09/05/23 0345  INR 1.1   COVID-19 Labs  No results for input(s): "DDIMER", "FERRITIN", "LDH", "CRP" in the last 72 hours.  Lab Results  Component Value Date   SARSCOV2NAA POSITIVE (A) 09/03/2023   SARSCOV2NAA NEGATIVE 03/19/2023   SARSCOV2NAA NEGATIVE 02/11/2023    CBC: Recent Labs  Lab 09/03/23 1730 09/04/23 0417 09/05/23 0345  WBC 14.0* 5.7 3.1*  NEUTROABS 12.0*  --  2.2  HGB 11.6* 9.7* 9.9*  HCT 36.3 29.8* 29.3*  MCV 96.3 94.6 90.7  PLT 225 135* 122*   Cardiac Enzymes: Recent Labs  Lab 09/03/23 1730 09/04/23 0417  CKTOTAL 575* 373*   BNP (last 3 results) No results for input(s): "PROBNP" in the last 8760 hours. CBG: Recent Labs  Lab 09/03/23 1725  GLUCAP 81   D-Dimer: No results for input(s): "DDIMER" in the last 72 hours. Hgb A1c: No results for input(s): "HGBA1C" in the last 72 hours. Lipid Profile: No results for input(s): "CHOL", "HDL", "LDLCALC",  "TRIG", "CHOLHDL", "LDLDIRECT" in the last 72 hours. Thyroid function studies: No results for input(s): "TSH", "T4TOTAL", "T3FREE", "THYROIDAB" in the last 72 hours.  Invalid input(s): "FREET3" Anemia work up: No results for input(s): "VITAMINB12", "FOLATE", "FERRITIN", "TIBC", "IRON", "RETICCTPCT" in the last 72 hours. Sepsis Labs: Recent Labs  Lab 09/03/23 1730 09/03/23 2334 09/04/23 0417 09/05/23 0345  PROCALCITON  --   --  0.57 0.48  WBC 14.0*  --  5.7 3.1*  LATICACIDVEN  --  2.9* 0.8  --    Microbiology Recent Results (from the past 240 hour(s))  Blood culture (routine x 2)     Status: None (Preliminary result)   Collection Time: 09/03/23  11:33 PM   Specimen: BLOOD RIGHT FOREARM  Result Value Ref Range Status   Specimen Description   Final    BLOOD RIGHT FOREARM Performed at [facility], [address], [place], [place] [address]    Special Requests   Final    BOTTLES DRAWN AEROBIC AND ANAEROBIC Blood Culture adequate volume Performed at [facility], [address] [place], [place] [address]    Culture   Final    NO GROWTH 2 DAYS Performed at [facility], [address], [place], [place] [address]    Report Status PENDING  Incomplete  Blood culture (routine x 2)     Status: None (Preliminary result)   Collection Time: 09/03/23 11:38 PM   Specimen: BLOOD  Result Value Ref Range Status   Specimen Description   Final    BLOOD RIGHT ANTECUBITAL Performed at [facility], [address], [place], [place] [address]    Special Requests   Final    BOTTLES DRAWN AEROBIC AND ANAEROBIC Blood Culture results may not be optimal due to an excessive volume of blood received in culture bottles Performed at [facility], [address] [place], [place] [address]    Culture   Final    NO GROWTH 2 DAYS Performed at [facility], [address], [place], [place] [address]    Report Status PENDING   Incomplete  Resp panel by RT-PCR (RSV, Flu A&B, Covid) Anterior Nasal Swab     Status: Abnormal   Collection Time: 09/03/23 11:51 PM   Specimen: Anterior Nasal Swab  Result Value Ref Range Status   SARS Coronavirus 2 by RT PCR POSITIVE (A) NEGATIVE Final    Comment: (NOTE) SARS-CoV-2 target nucleic acids are DETECTED.  The SARS-CoV-2 RNA is generally detectable in upper respiratory specimens during the acute phase of infection. Positive results are indicative of the presence of the identified virus, but do not rule out bacterial infection or co-infection with other pathogens not detected by the test. Clinical correlation with patient history and other diagnostic information is necessary to determine patient infection status. The expected result is Negative.  Fact Sheet for Patients: BloggerCourse.com  Fact Sheet for Healthcare Providers: SeriousBroker.it  This test is not yet approved or cleared by the [place] FDA and  has been authorized for detection and/or diagnosis of SARS-CoV-2 by FDA under an Emergency Use Authorization (EUA).  This EUA will remain in effect (meaning this test can be used) for the duration of  the COVID-19 declaration under Section 564(b)(1) of the A ct, 21 U.S.C. section 360bbb-3(b)(1), unless the authorization is terminated or revoked sooner.     Influenza A by PCR NEGATIVE NEGATIVE Final   Influenza B by PCR NEGATIVE NEGATIVE Final    Comment: (NOTE) The Xpert Xpress SARS-CoV-2/FLU/RSV plus assay is intended as an aid in the diagnosis of influenza from Nasopharyngeal swab specimens and should not be used as a sole basis for treatment. Nasal washings and aspirates are unacceptable for Xpert Xpress SARS-CoV-2/FLU/RSV testing.  Fact Sheet for Patients: BloggerCourse.com  Fact Sheet for Healthcare Providers: SeriousBroker.it  This test is not yet  approved or cleared by the [place] FDA and has been authorized for detection and/or diagnosis of SARS-CoV-2 by FDA under an Emergency Use Authorization (EUA). This EUA will remain in effect (meaning this test can be used) for the duration of the COVID-19 declaration under Section 564(b)(1) of the Act, 21 U.S.C.  section 360bbb-3(b)(1), unless the authorization is terminated or revoked.     Resp Syncytial Virus by PCR NEGATIVE NEGATIVE Final    Comment: (NOTE) Fact Sheet for Patients: BloggerCourse.com  Fact Sheet for Healthcare Providers: SeriousBroker.it  This test is not yet approved or cleared by the [place] FDA and has been authorized for detection and/or diagnosis of SARS-CoV-2 by FDA under an Emergency Use Authorization (EUA). This EUA will remain in effect (meaning this test can be used) for the duration of the COVID-19 declaration under Section 564(b)(1) of the Act, 21 U.S.C. section 360bbb-3(b)(1), unless the authorization is terminated or revoked.  Performed at [facility], [address], [place], [place] [address]      Medications:    amLODipine  10 mg Oral Daily   calcium carbonate  1 tablet Oral Q breakfast   dextromethorphan-guaiFENesin  1 tablet Oral BID   feeding supplement  237 mL Oral BID BM   folic acid  1 mg Oral Daily   heparin  5,000 Units Subcutaneous Q8H   multivitamin with minerals  1 tablet Oral Daily   thiamine  100 mg Oral Daily   Or   thiamine  100 mg Intravenous Daily   Continuous Infusions:  ampicillin-sulbactam (UNASYN) IV Stopped (09/06/23 0301)   potassium chloride     sodium phosphate 30 mmol in dextrose 5 % 250 mL infusion 30 mmol (09/06/23 0630)      LOS: 2 days   Marinda Elk  [facility]  09/06/2023, 7:16 AM

## 2023-09-06 NOTE — Evaluation (Signed)
Physical Therapy Evaluation Patient Details Name: Stephanie Riley MRN: [id number] DOB: 09/04/1951 Today's Date: 09/06/2023  History of Present Illness  Pt is 72 yo female admitted on 09/03/23 with acute ETOH intoxication, alcoholic ketoacidosis, and incidental COVID.  Pt ex-husband found her down covered in urine with empty vodka bottle.  There has been concern expressed about pt not being able to care for self at home.  Pt with hx including HTN and ETOH abuse.  Clinical Impression  Pt admitted with above diagnosis. At baseline, pt resided alone and was independent. Noting in MD note that there is concern about pt being able to care for self at home due to alcoholism.  Today, pt is able to transfer to EOB but then needs min A for transfers and to ambulate 6' with HHA/min A balance.  She declined further activity due to not feeling well.  Pt did demonstrate weakness but also decreased safety awareness requiring frequent cues.  Pt does have decreased balance and safety - fall risk. With limited support at home do recommend Patient will benefit from continued inpatient follow up therapy, <3 hours/day . Pt currently with functional limitations due to the deficits listed below (see PT Problem List). Pt will benefit from acute skilled PT to increase their independence and safety with mobility to allow discharge.           If plan is discharge home, recommend the following: A little help with walking and/or transfers;A little help with bathing/dressing/bathroom;Assistance with cooking/housework;Help with stairs or ramp for entrance   Can travel by private vehicle   Yes    Equipment Recommendations Rolling walker (2 wheels)  Recommendations for Other Services       Functional Status Assessment Patient has had a recent decline in their functional status and demonstrates the ability to make significant improvements in function in a reasonable and predictable amount of time.     Precautions /  Restrictions Precautions Precautions: Fall      Mobility  Bed Mobility Overal bed mobility: Needs Assistance Bed Mobility: Supine to Sit     Supine to sit: Contact guard          Transfers Overall transfer level: Needs assistance Equipment used: 1 person hand held assist Transfers: Sit to/from Stand, Bed to chair/wheelchair/BSC Sit to Stand: Min assist   Step pivot transfers: Min assist       General transfer comment: HHA, min A for balance; stood x 3 during session    Ambulation/Gait Ambulation/Gait assistance: Min assist Gait Distance (Feet): 6 Feet Assistive device: 1 person hand held assist Gait Pattern/deviations: Step-to pattern, Decreased stride length Gait velocity: decreased     General Gait Details: Unsteady, fatigued easily, min A for balance  Stairs            Wheelchair Mobility     Tilt Bed    Modified Rankin (Stroke Patients Only)       Balance Overall balance assessment: Needs assistance Sitting-balance support: No upper extremity supported Sitting balance-Leahy Scale: Fair     Standing balance support: Single extremity supported Standing balance-Leahy Scale: Poor                               Pertinent Vitals/Pain Pain Assessment Pain Assessment: No/denies pain    Home Living Family/patient expects to be discharged to:: Private residence Living Arrangements: Alone Available Help at Discharge: Available PRN/intermittently;Family Type of Home: House Home Access: Stairs to  enter Entrance Stairs-Rails: None Entrance Stairs-Number of Steps: 3   Home Layout: Two level;Able to live on main level with bedroom/bathroom Home Equipment: None      Prior Function Prior Level of Function : Independent/Modified Independent             Mobility Comments: Pt reports independent.  Noted in MD notes separated spouse expressed concern about pt being able to take care of self due to alcholism        Extremity/Trunk Assessment   Upper Extremity Assessment Upper Extremity Assessment: Generalized weakness;Difficult to assess due to impaired cognition    Lower Extremity Assessment Lower Extremity Assessment: Generalized weakness;Difficult to assess due to impaired cognition (Pt confused reports not feeling well and wanting to lie down)    Cervical / Trunk Assessment Cervical / Trunk Assessment: Normal  Communication      Cognition Arousal: Alert Behavior During Therapy: Impulsive Overall Cognitive Status: Impaired/Different from baseline Area of Impairment: Safety/judgement, Problem solving                         Safety/Judgement: Decreased awareness of safety, Decreased awareness of deficits   Problem Solving: Requires verbal cues, Requires tactile cues General Comments: Poor safety, lays down in bed wrong direction, cues for lines        General Comments General comments (skin integrity, edema, etc.): VSS    Exercises     Assessment/Plan    PT Assessment Patient needs continued PT services  PT Problem List Decreased strength;Pain;Decreased range of motion;Decreased activity tolerance;Decreased balance;Decreased mobility;Decreased cognition;Decreased knowledge of use of DME;Decreased safety awareness;Decreased knowledge of precautions       PT Treatment Interventions DME instruction;Therapeutic exercise;Gait training;Balance training;Stair training;Functional mobility training;Therapeutic activities;Patient/family education;Modalities;Neuromuscular re-education    PT Goals (Current goals can be found in the Care Plan section)  Acute Rehab PT Goals Patient Stated Goal: feel better PT Goal Formulation: With patient Time For Goal Achievement: 09/20/23 Potential to Achieve Goals: Good    Frequency Min 1X/week     Co-evaluation               AM-PAC PT "6 Clicks" Mobility  Outcome Measure Help needed turning from your back to your side while  in a flat bed without using bedrails?: A Little Help needed moving from lying on your back to sitting on the side of a flat bed without using bedrails?: A Little Help needed moving to and from a bed to a chair (including a wheelchair)?: A Little Help needed standing up from a chair using your arms (e.g., wheelchair or bedside chair)?: A Little Help needed to walk in hospital room?: Total Help needed climbing 3-5 steps with a railing? : Total 6 Click Score: 14    End of Session Equipment Utilized During Treatment: Gait belt Activity Tolerance: Patient limited by fatigue (declined further activity) Patient left: in bed;with bed alarm set;with call bell/phone within reach Nurse Communication: Mobility status PT Visit Diagnosis: Other abnormalities of gait and mobility (R26.89);Muscle weakness (generalized) (M62.81)    Time: 1130-1145 PT Time Calculation (min) (ACUTE ONLY): 15 min   Charges:   PT Evaluation $PT Eval Low Complexity: 1 Low   PT General Charges $$ ACUTE PT VISIT: 1 Visit         Anise Salvo, PT Acute Rehab [facility] [phone]   Rayetta Humphrey 09/06/2023, 1:53 PM

## 2023-09-07 DIAGNOSIS — E876 Hypokalemia: Secondary | ICD-10-CM | POA: Diagnosis not present

## 2023-09-07 DIAGNOSIS — E8729 Other acidosis: Secondary | ICD-10-CM | POA: Diagnosis not present

## 2023-09-07 DIAGNOSIS — E872 Acidosis, unspecified: Secondary | ICD-10-CM | POA: Diagnosis not present

## 2023-09-07 LAB — BASIC METABOLIC PANEL
Anion gap: 8 (ref 5–15)
BUN: 5 mg/dL — ABNORMAL LOW (ref 8–23)
CO2: 32 mmol/L (ref 22–32)
Calcium: 8.1 mg/dL — ABNORMAL LOW (ref 8.9–10.3)
Chloride: 97 mmol/L — ABNORMAL LOW (ref 98–111)
Creatinine, Ser: 0.4 mg/dL — ABNORMAL LOW (ref 0.44–1.00)
GFR, Estimated: >60 mL/min (ref >60)
Glucose, Bld: 109 mg/dL — ABNORMAL HIGH (ref 70–99)
Potassium: 2.3 mmol/L — CL (ref 3.5–5.1)
Sodium: 137 mmol/L (ref 135–145)

## 2023-09-07 LAB — CBC
HCT: 30 % — ABNORMAL LOW (ref 36.0–46.0)
Hemoglobin: 9.9 g/dL — ABNORMAL LOW (ref 12.0–15.0)
MCH: 30 pg (ref 26.0–34.0)
MCHC: 33 g/dL (ref 30.0–36.0)
MCV: 90.9 fL (ref 80.0–100.0)
Platelets: 123 10*3/uL — ABNORMAL LOW (ref 150–400)
RBC: 3.3 MIL/uL — ABNORMAL LOW (ref 3.87–5.11)
RDW: 20 % — ABNORMAL HIGH (ref 11.5–15.5)
WBC: 4.8 10*3/uL (ref 4.0–10.5)
nRBC: 0 % (ref 0.0–0.2)

## 2023-09-07 LAB — MAGNESIUM: Magnesium: 1.7 mg/dL (ref 1.7–2.4)

## 2023-09-07 LAB — PHOSPHORUS: Phosphorus: 2.2 mg/dL — ABNORMAL LOW (ref 2.5–4.6)

## 2023-09-07 MED ORDER — POTASSIUM CHLORIDE 10 MEQ/100ML IV SOLN
10.0000 meq | Freq: Once | INTRAVENOUS | Status: AC
  Administered 2023-09-07: 10 meq via INTRAVENOUS
  Filled 2023-09-07: qty 100

## 2023-09-07 MED ORDER — HYDROXYZINE HCL 25 MG PO TABS
25.0000 mg | ORAL_TABLET | Freq: Once | ORAL | Status: AC
  Administered 2023-09-07: 25 mg via ORAL
  Filled 2023-09-07: qty 1

## 2023-09-07 MED ORDER — SODIUM CHLORIDE 0.9 % IV SOLN: INTRAVENOUS | Status: DC

## 2023-09-07 MED ORDER — LOPERAMIDE HCL 2 MG PO CAPS
4.0000 mg | ORAL_CAPSULE | ORAL | Status: DC | PRN
  Administered 2023-09-07 – 2023-09-08 (×3): 4 mg via ORAL
  Filled 2023-09-07 (×5): qty 2

## 2023-09-07 MED ORDER — K PHOS MONO-SOD PHOS DI & MONO 155-852-130 MG PO TABS
500.0000 mg | ORAL_TABLET | Freq: Four times a day (QID) | ORAL | Status: AC
  Administered 2023-09-07 (×4): 500 mg via ORAL
  Filled 2023-09-07 (×4): qty 2

## 2023-09-07 MED ORDER — POTASSIUM CHLORIDE 10 MEQ/100ML IV SOLN
10.0000 meq | INTRAVENOUS | Status: AC
  Administered 2023-09-07 (×3): 10 meq via INTRAVENOUS
  Filled 2023-09-07 (×3): qty 100

## 2023-09-07 MED ORDER — POTASSIUM CHLORIDE CRYS ER 20 MEQ PO TBCR
40.0000 meq | EXTENDED_RELEASE_TABLET | Freq: Two times a day (BID) | ORAL | Status: AC
  Administered 2023-09-07 (×2): 40 meq via ORAL
  Filled 2023-09-07 (×2): qty 2

## 2023-09-07 MED ORDER — MAGNESIUM OXIDE -MG SUPPLEMENT 400 (240 MG) MG PO TABS
400.0000 mg | ORAL_TABLET | Freq: Two times a day (BID) | ORAL | Status: AC
  Administered 2023-09-07 (×2): 400 mg via ORAL
  Filled 2023-09-07 (×2): qty 1

## 2023-09-07 NOTE — TOC Initial Note (Signed)
Transition of Care [facility]) - Initial/Assessment Note    Patient Details  Name: Stephanie Riley MRN: [id number] Date of Birth: 06/29/1951  [facility] CM/SW Contact:    Larrie Kass, LCSW Phone Number: 09/07/2023, 9:52 AM  Clinical Narrative:                 CSW spoke with pt to discuss SNF placement recommendations. Pt stated she planned to go to [facility] when she was discharged. Pt stated she would like to discuss things with her spouse before she makes a decision. CSW received permission to speak with pt's spouse Nadiyah Zeis. CSW spoke with pt's spouse to give an update, he reports he will talk with pt to help make a decision.   1040am CSW spoke with pt, she reports her husband will be coming to the hospital to discuss SNF in person. Pt is hesitant to short-term rehab placement. She stated she had not had trouble until she got COVID. CSW discuss SNF vs HH with the pt. She has agreed for CSW to send information out to the facilities in the area and then she will decide if she would like go or not. TOC to follow.    Expected Discharge Plan: Skilled Nursing Facility Barriers to Discharge: Continued Medical Work up   Patient Goals and CMS Choice            Expected Discharge Plan and Services In-house Referral: Clinical Social Work                                            Prior Living Arrangements/Services     Patient language and need for interpreter reviewed:: Yes        Need for Family Participation in Patient Care: Yes (Comment) Care giver support system in place?: No (comment)   Criminal Activity/Legal Involvement Pertinent to Current Situation/Hospitalization: No - Comment as needed  Activities of Daily Living   ADL Screening (condition at time of admission) Does the patient have a NEW difficulty with bathing/dressing/toileting/self-feeding that is expected to last >3 days?: No Does the patient have a  NEW difficulty with getting in/out of bed, walking, or climbing stairs that is expected to last >3 days?: Yes (Initiates electronic notice to provider for possible PT consult) Does the patient have a NEW difficulty with communication that is expected to last >3 days?: No Is the patient deaf or have difficulty hearing?: No Does the patient have difficulty seeing, even when wearing glasses/contacts?: No Does the patient have difficulty concentrating, remembering, or making decisions?: Yes  Permission Sought/Granted                  Emotional Assessment Appearance:: Appears stated age Attitude/Demeanor/Rapport: Guarded, Inconsistent Affect (typically observed): Quiet Orientation: : Oriented to Self, Oriented to  Time, Oriented to Place, Oriented to Situation   Psych Involvement: No (comment)  Admission diagnosis:  Dehydration [E86.0] Alcoholic ketoacidosis [E87.29] Alcohol use [Z78.9] Fall, initial encounter [W19.XXXA] Patient Active Problem List   Diagnosis Date Noted   Hypophosphatemia 09/05/2023   Alcoholic ketoacidosis 09/04/2023   Lactic acidosis 09/04/2023   SIRS (systemic inflammatory response syndrome) (HCC) 09/04/2023   Transaminitis 09/04/2023   Elevated CK 09/04/2023   Hypokalemia 09/04/2023   Hypocalcemia 09/04/2023   COVID-19 virus infection 09/04/2023   Essential hypertension 09/04/2023   Aspiration into airway 09/04/2023  Alcohol use disorder 03/19/2023   Alcohol use disorder, moderate, dependence (HCC) 02/12/2023   PCP:  Robby Sermon, DO Pharmacy:   [id number] - [place], [place] - [address] [place] [address] Phone: [phone] Fax: [phone]  [id number] - [place], [place] - [address] [address] Phone: [phone] Fax: [phone]     Social Determinants of Health (SDOH) Social History: SDOH  Screenings   Food Insecurity: No Food Insecurity (09/04/2023)  Housing: Low Risk  (09/04/2023)  Transportation Needs: No Transportation Needs (09/04/2023)  Utilities: Not At Risk (09/04/2023)  Alcohol Screen: High Risk (05/25/2023)  Depression (PHQ2-9): Medium Risk (05/28/2023)  Financial Resource Strain: Low Risk  (10/19/2022)   Received from [facility]  Physical Activity: Insufficiently Active (10/19/2022)   Received from [facility], [facility]  Social Connections: Socially Integrated (10/19/2022)   Received from [facility], [facility]  Stress: No Stress Concern Present (10/19/2022)   Received from [facility], [facility]  Tobacco Use: Low Risk  (09/03/2023)   SDOH Interventions:     Readmission Risk Interventions     No data to display

## 2023-09-07 NOTE — Plan of Care (Signed)
  Problem: Education: Goal: Knowledge of General Education information will improve Description: Including pain rating scale, medication(s)/side effects and non-pharmacologic comfort measures Outcome: Progressing   Problem: Clinical Measurements: Goal: Respiratory complications will improve Outcome: Progressing   Problem: Coping: Goal: Level of anxiety will decrease Outcome: Progressing   Problem: Elimination: Goal: Will not experience complications related to urinary retention Outcome: Progressing   Problem: Pain Managment: Goal: General experience of comfort will improve Outcome: Progressing   Problem: Safety: Goal: Ability to remain free from injury will improve Outcome: Progressing

## 2023-09-07 NOTE — Progress Notes (Addendum)
[facility] PROGRESS NOTE    Progress Note  Stephanie Riley  NWG:[id number] DOB: February 24, 1951 DOA: 09/03/2023 PCP: Robby Sermon, DO     Brief Narrative:   Stephanie Riley is an 72 y.o. female past medical history significant for essential hypertension and alcohol abuse comes into the emergency room for alcohol intoxication, she separated after 44 years of marriage due to recurrent alcohol abuse, her husband called EMS who found her agitated and vomiting  Significant Events: 09/03/2023 admitted for acute intoxication/alcoholic ketoacidosis  Significant studies: COVID-positive CT of the head showed no acute findings chronic atrophic changes. CT of the C-spine showed no acute findings Chest x-ray showed no acute findings  Antibiotics: None  Microbiology data: None   Assessment/Plan:   Alcoholic ketoacidosis/SIRS alcohol disorder: Inebriated on admission started on IV fluids now resolved. Started on CIWA protocol does not require any intervention in the last 72 hours discontinue Ativan. PT OT evaluated the patient will need to go to skilled nursing facility.  Aspiration into airway: No evidence of pneumonia discontinue Unasyn. Has remained afebrile leukocytosis has resolved.  On admission had a temperature of 100.5. Will complete a 5-day course of IV Unasyn.  Incidental COVID-19 viral infection Chest x-ray clear not hypoxic.  Elevated LFTs: Likely due to alcohol use, Madrey function score is low no indication for steroids. LFTs are trending down.  SIRS: Sepsis has been ruled out, started on IV Unasyn due to fevers, with no evidence of infection will complete a 5-day course.  Essential hypertension: Due to poor oral intake and unremarkable blood pressure we will continue to hold antihypertensive medication.  Severe hypokalemia: Continues to be low this morning replete orally .  Try to potassium greater than 4 and magnesium greater than  2. Recheck tomorrow morning.  Hypocalcemia: Stable corrected for albumin is actually unremarkable.  Severe hypophosphatemia: Level is pending this morning, continue replete orally.  Try to keep phosphorus greater than 3.  Elevated CK nontraumatic: Likely due to immobility, will start on IV fluids and admission.   DVT prophylaxis: lovenox Family Communication:Husband Status is: Inpatient Remains inpatient appropriate because: Ketosis now with electrolyte imbalance.    Code Status:     Code Status Orders  (From admission, onward)           Start     Ordered   09/04/23 0122  Full code  Continuous       Question:  By:  Answer:  Consent: discussion documented in EHR   09/04/23 0122           Code Status History     Date Active Date Inactive Code Status Order ID Comments User Context   05/25/2023 1652 05/29/2023 1202 Full Code [id number]  Sunday Corn, NP ED   03/19/2023 1439 03/26/2023 1512 Full Code [id number]  Lenard Lance, FNP ED   02/11/2023 1434 02/18/2023 0038 Full Code [id number]  Lamar Sprinkles, MD ED         IV Access:   Peripheral IV   Procedures and diagnostic studies:   No results found.   Medical Consultants:   None.   Subjective:    Stephanie Riley no complaints tolerating her diet.  Objective:    Vitals:   09/06/23 0614 09/06/23 1305 09/06/23 2207 09/07/23 0615  BP: (!) 137/93 117/74 133/88 (!) 131/91  Pulse: 92 94 99 (!) 103  Resp: 17 18 20 19   Temp: 98.2 F (36.8 C) 98 F (36.7 C) 97.7 F (36.5 C) 97.8  F (36.6 C)  TempSrc: Oral Oral Oral Oral  SpO2: 96% (!) 81% 95% 97%   SpO2: 97 %   Intake/Output Summary (Last 24 hours) at 09/07/2023 0842 Last data filed at 09/07/2023 0300 Gross per 24 hour  Intake 960 ml  Output 400 ml  Net 560 ml   There were no vitals filed for this visit.  Exam: General exam: In no acute distress. Respiratory system: Good air movement and clear to auscultation. Cardiovascular system:  S1 & S2 heard, RRR. No JVD. Gastrointestinal system: Abdomen is nondistended, soft and nontender.  Extremities: No pedal edema. Skin: No rashes, lesions or ulcers Psychiatry: Judgement and insight appear normal. Mood & affect appropriate.   Data Reviewed:    Labs: Basic Metabolic Panel: Recent Labs  Lab 09/03/23 1730 09/04/23 0417 09/04/23 0958 09/05/23 0345 09/05/23 1420 09/06/23 0340 09/07/23 0720  NA 134* 130* 134* 136  --  138  139 137  K 3.0* 3.1* 2.8* 2.7*   < > 2.2*  2.3* 2.3*  CL 98 98 102 105  --  99  100 97*  CO2 8* 14* 17* 25  --  28  30 32  GLUCOSE 80 137* 120* 134*  --  97  101* 109*  BUN 10 9 7* <5*  --  <5*  <5* 5*  CREATININE 0.58 0.76 0.55 0.41*  --  0.37*  0.39* 0.40*  CALCIUM 7.6* 7.7* 8.3* 8.6*  --  8.4*  8.4* 8.1*  MG 2.1 1.7  --  2.2  --  1.7 1.7  PHOS  --  1.3*  --  <1.0*  --  <1.0*  <1.0*  --    < > = values in this interval not displayed.   GFR CrCl cannot be calculated (Unknown ideal weight.). Liver Function Tests: Recent Labs  Lab 09/03/23 1730 09/04/23 0417 09/05/23 0345 09/06/23 0340  AST 185* 137* 90* 68*  ALT 81* 64* 53* 49*  ALKPHOS 77 62 56 61  BILITOT 1.8* 2.2* 1.1 0.8  PROT 7.1 5.5* 5.4* 5.7*  ALBUMIN 3.9 3.0* 3.0* 3.1*  3.0*   No results for input(s): "LIPASE", "AMYLASE" in the last 168 hours. No results for input(s): "AMMONIA" in the last 168 hours. Coagulation profile Recent Labs  Lab 09/05/23 0345  INR 1.1   COVID-19 Labs  No results for input(s): "DDIMER", "FERRITIN", "LDH", "CRP" in the last 72 hours.  Lab Results  Component Value Date   SARSCOV2NAA POSITIVE (A) 09/03/2023   SARSCOV2NAA NEGATIVE 03/19/2023   SARSCOV2NAA NEGATIVE 02/11/2023    CBC: Recent Labs  Lab 09/03/23 1730 09/04/23 0417 09/05/23 0345 09/07/23 0720  WBC 14.0* 5.7 3.1* 4.8  NEUTROABS 12.0*  --  2.2  --   HGB 11.6* 9.7* 9.9* 9.9*  HCT 36.3 29.8* 29.3* 30.0*  MCV 96.3 94.6 90.7 90.9  PLT 225 135* 122* 123*   Cardiac  Enzymes: Recent Labs  Lab 09/03/23 1730 09/04/23 0417  CKTOTAL 575* 373*   BNP (last 3 results) No results for input(s): "PROBNP" in the last 8760 hours. CBG: Recent Labs  Lab 09/03/23 1725  GLUCAP 81   D-Dimer: No results for input(s): "DDIMER" in the last 72 hours. Hgb A1c: No results for input(s): "HGBA1C" in the last 72 hours. Lipid Profile: No results for input(s): "CHOL", "HDL", "LDLCALC", "TRIG", "CHOLHDL", "LDLDIRECT" in the last 72 hours. Thyroid function studies: No results for input(s): "TSH", "T4TOTAL", "T3FREE", "THYROIDAB" in the last 72 hours.  Invalid input(s): "FREET3" Anemia work up: No results  for input(s): "VITAMINB12", "FOLATE", "FERRITIN", "TIBC", "IRON", "RETICCTPCT" in the last 72 hours. Sepsis Labs: Recent Labs  Lab 09/03/23 1730 09/03/23 2334 09/04/23 0417 09/05/23 0345 09/07/23 0720  PROCALCITON  --   --  0.57 0.48  --   WBC 14.0*  --  5.7 3.1* 4.8  LATICACIDVEN  --  2.9* 0.8  --   --    Microbiology Recent Results (from the past 240 hour(s))  Blood culture (routine x 2)     Status: None (Preliminary result)   Collection Time: 09/03/23 11:33 PM   Specimen: BLOOD RIGHT FOREARM  Result Value Ref Range Status   Specimen Description   Final    BLOOD RIGHT FOREARM Performed at [facility], [address], [place], [place] [address]    Special Requests   Final    BOTTLES DRAWN AEROBIC AND ANAEROBIC Blood Culture adequate volume Performed at [facility], [address] [place], [place] [address]    Culture   Final    NO GROWTH 3 DAYS Performed at [facility], [address] [place], [place] [address]    Report Status PENDING  Incomplete  Blood culture (routine x 2)     Status: None (Preliminary result)   Collection Time: 09/03/23 11:38 PM   Specimen: BLOOD  Result Value Ref Range Status   Specimen Description   Final    BLOOD RIGHT ANTECUBITAL Performed at [facility], [address], [place], [place] [address]    Special Requests   Final    BOTTLES DRAWN AEROBIC AND ANAEROBIC Blood Culture results may not be optimal due to an excessive volume of blood received in culture bottles Performed at [facility], [address], [place], [place] [address]    Culture   Final    NO GROWTH 3 DAYS Performed at [facility], [address], [place], [place] [address]    Report Status PENDING  Incomplete  Resp panel by RT-PCR (RSV, Flu A&B, Covid) Anterior Nasal Swab     Status: Abnormal   Collection Time: 09/03/23 11:51 PM   Specimen: Anterior Nasal Swab  Result Value Ref Range Status   SARS Coronavirus 2 by RT PCR POSITIVE (A) NEGATIVE Final    Comment: (NOTE) SARS-CoV-2 target nucleic acids are DETECTED.  The SARS-CoV-2 RNA is generally detectable in upper respiratory specimens during the acute phase of infection. Positive results are indicative of the presence of the identified virus, but do not rule out bacterial infection or co-infection with other pathogens not detected by the test. Clinical correlation with patient history and other diagnostic information is necessary to determine patient infection status. The expected result is Negative.  Fact Sheet for Patients: BloggerCourse.com  Fact Sheet for Healthcare Providers: SeriousBroker.it  This test is not yet approved or cleared by the [place] FDA and  has been authorized for detection and/or diagnosis of SARS-CoV-2 by FDA under an Emergency Use Authorization (EUA).  This EUA will remain in effect (meaning this test can be used) for the duration of  the COVID-19 declaration under Section 564(b)(1) of the A ct, 21 U.S.C. section 360bbb-3(b)(1), unless the authorization is terminated or revoked sooner.     Influenza A by PCR NEGATIVE NEGATIVE Final   Influenza B by PCR NEGATIVE NEGATIVE Final    Comment: (NOTE) The Xpert  Xpress SARS-CoV-2/FLU/RSV plus assay is intended as an aid in the diagnosis of influenza from Nasopharyngeal swab specimens and  should not be used as a sole basis for treatment. Nasal washings and aspirates are unacceptable for Xpert Xpress SARS-CoV-2/FLU/RSV testing.  Fact Sheet for Patients: BloggerCourse.com  Fact Sheet for Healthcare Providers: SeriousBroker.it  This test is not yet approved or cleared by the [place] FDA and has been authorized for detection and/or diagnosis of SARS-CoV-2 by FDA under an Emergency Use Authorization (EUA). This EUA will remain in effect (meaning this test can be used) for the duration of the COVID-19 declaration under Section 564(b)(1) of the Act, 21 U.S.C. section 360bbb-3(b)(1), unless the authorization is terminated or revoked.     Resp Syncytial Virus by PCR NEGATIVE NEGATIVE Final    Comment: (NOTE) Fact Sheet for Patients: BloggerCourse.com  Fact Sheet for Healthcare Providers: SeriousBroker.it  This test is not yet approved or cleared by the [place] FDA and has been authorized for detection and/or diagnosis of SARS-CoV-2 by FDA under an Emergency Use Authorization (EUA). This EUA will remain in effect (meaning this test can be used) for the duration of the COVID-19 declaration under Section 564(b)(1) of the Act, 21 U.S.C. section 360bbb-3(b)(1), unless the authorization is terminated or revoked.  Performed at [facility], [address], [place], [place] [address]      Medications:    amLODipine  10 mg Oral Daily   calcium carbonate  1 tablet Oral Q breakfast   dextromethorphan-guaiFENesin  1 tablet Oral BID   feeding supplement  237 mL Oral BID BM   folic acid  1 mg Oral Daily   heparin  5,000 Units Subcutaneous Q8H   multivitamin with minerals  1 tablet Oral Daily   phosphorus  500 mg Oral  TID   thiamine  100 mg Oral Daily   Or   thiamine  100 mg Intravenous Daily   Continuous Infusions:  ampicillin-sulbactam (UNASYN) IV 3 g (09/07/23 0247)      LOS: 3 days   Marinda Elk  [facility]  09/07/2023, 8:42 AM

## 2023-09-07 NOTE — NC FL2 (Signed)
[place] MEDICAID FL2 LEVEL OF CARE FORM     IDENTIFICATION  Patient Name: Stephanie Riley Birthdate: Mar 11, 1951 Sex: female Admission Date (Current Location): 09/03/2023  [facility] and [place] Number:  Producer, television/film/video and Address:  [facility],  [address] [place], [place] [address]      Provider Number: [id number]  Attending Physician Name and Address:  Marinda Elk, MD  Relative Name and Phone Number:  LIVIANNA, PETRAGLIA (Spouse)  [phone] [facility] Phone)    Current Level of Care: Hospital Recommended Level of Care: Skilled Nursing Facility Prior Approval Number:    Date Approved/Denied:   PASRR Number: [id number]  Discharge Plan: SNF    Current Diagnoses: Patient Active Problem List   Diagnosis Date Noted   Hypophosphatemia 09/05/2023   Alcoholic ketoacidosis 09/04/2023   Lactic acidosis 09/04/2023   SIRS (systemic inflammatory response syndrome) (HCC) 09/04/2023   Transaminitis 09/04/2023   Elevated CK 09/04/2023   Hypokalemia 09/04/2023   Hypocalcemia 09/04/2023   COVID-19 virus infection 09/04/2023   Essential hypertension 09/04/2023   Aspiration into airway 09/04/2023   Alcohol use disorder 03/19/2023   Alcohol use disorder, moderate, dependence (HCC) 02/12/2023    Orientation RESPIRATION BLADDER Height & Weight     Self, Time, Situation, Place  Normal Continent Weight:   Height:     BEHAVIORAL SYMPTOMS/MOOD NEUROLOGICAL BOWEL NUTRITION STATUS      Continent Diet (Heart Healthy)  AMBULATORY STATUS COMMUNICATION OF NEEDS Skin   Limited Assist Verbally Other (Comment) (skin tear elbow)                       Personal Care Assistance Level of Assistance  Bathing, Feeding, Dressing Bathing Assistance: Limited assistance Feeding assistance: Independent Dressing Assistance: Limited assistance     Functional Limitations Info  Sight, Hearing, Speech Sight Info: Adequate Hearing Info: Adequate Speech Info: Adequate     SPECIAL CARE FACTORS FREQUENCY  PT (By licensed PT), OT (By licensed OT)     PT Frequency: 5 x a week OT Frequency: 5 x a week            Contractures Contractures Info: Not present    Additional Factors Info  Code Status, Allergies Code Status Info: full Allergies Info: NKA           Current Medications (09/07/2023):  This is the current hospital active medication list Current Facility-Administered Medications  Medication Dose Route Frequency Provider Last Rate Last Admin   0.9 %  sodium chloride infusion   Intravenous Continuous Marinda Elk, MD 100 mL/hr at 09/07/23 0932 New Bag at 09/07/23 0932   amLODipine (NORVASC) tablet 10 mg  10 mg Oral Daily Adefeso, Oladapo, DO   10 mg at 09/07/23 0923   Ampicillin-Sulbactam (UNASYN) 3 g in sodium chloride 0.9 % 100 mL IVPB  3 g Intravenous Q6H Adefeso, Oladapo, DO 200 mL/hr at 09/07/23 0936 3 g at 09/07/23 0936   calcium carbonate (OS-CAL - dosed in mg of elemental calcium) tablet 1,250 mg  1 tablet Oral Q breakfast Adefeso, Oladapo, DO   1,250 mg at 09/07/23 0924   dextromethorphan-guaiFENesin (MUCINEX DM) 30-600 MG per 12 hr tablet 1 tablet  1 tablet Oral BID Adefeso, Oladapo, DO   1 tablet at 09/07/23 0923   feeding supplement (ENSURE ENLIVE / ENSURE PLUS) liquid 237 mL  237 mL Oral BID BM Carollee Herter, DO   237 mL at 09/07/23 0925   folic acid (FOLVITE) tablet 1 mg  1 mg Oral Daily Adefeso, Oladapo, DO   1 mg at 09/07/23 0923   heparin injection 5,000 Units  5,000 Units Subcutaneous Q8H Adefeso, Oladapo, DO   5,000 Units at 09/07/23 0640   hydrOXYzine (ATARAX) tablet 25 mg  25 mg Oral Q4H PRN Luiz Iron, NP   25 mg at 09/07/23 0640   magnesium oxide (MAG-OX) tablet 400 mg  400 mg Oral BID Marinda Elk, MD   400 mg at 09/07/23 1610   multivitamin with minerals tablet 1 tablet  1 tablet Oral Daily Adefeso, Oladapo, DO   1 tablet at 09/07/23 0923   ondansetron (ZOFRAN) tablet 4 mg  4 mg Oral Q6H PRN Adefeso,  Oladapo, DO   4 mg at 09/06/23 1806   Or   ondansetron (ZOFRAN) injection 4 mg  4 mg Intravenous Q6H PRN Adefeso, Oladapo, DO   4 mg at 09/05/23 2020   phosphorus (K PHOS NEUTRAL) tablet 500 mg  500 mg Oral QID Marinda Elk, MD   500 mg at 09/07/23 0950   potassium chloride SA (KLOR-CON M) CR tablet 40 mEq  40 mEq Oral BID Marinda Elk, MD   40 mEq at 09/07/23 9604   thiamine (VITAMIN B1) tablet 100 mg  100 mg Oral Daily Adefeso, Oladapo, DO   100 mg at 09/07/23 5409   Or   thiamine (VITAMIN B1) injection 100 mg  100 mg Intravenous Daily Adefeso, Oladapo, DO   100 mg at 09/03/23 1725     Discharge Medications: Please see discharge summary for a list of discharge medications.  Relevant Imaging Results:  Relevant Lab Results:   Additional Information [id number]  Valentina Shaggy Elzy Tomasello, LCSW

## 2023-09-08 DIAGNOSIS — E8729 Other acidosis: Secondary | ICD-10-CM | POA: Diagnosis not present

## 2023-09-08 LAB — BASIC METABOLIC PANEL
Anion gap: 11 (ref 5–15)
BUN: 6 mg/dL — ABNORMAL LOW (ref 8–23)
CO2: 26 mmol/L (ref 22–32)
Calcium: 8.7 mg/dL — ABNORMAL LOW (ref 8.9–10.3)
Chloride: 101 mmol/L (ref 98–111)
Creatinine, Ser: 0.49 mg/dL (ref 0.44–1.00)
GFR, Estimated: >60 mL/min (ref >60)
Glucose, Bld: 114 mg/dL — ABNORMAL HIGH (ref 70–99)
Potassium: 4.2 mmol/L (ref 3.5–5.1)
Sodium: 138 mmol/L (ref 135–145)

## 2023-09-08 LAB — RENAL FUNCTION PANEL
Albumin: 3 g/dL — ABNORMAL LOW (ref 3.5–5.0)
Anion gap: 11 (ref 5–15)
BUN: <5 mg/dL — ABNORMAL LOW (ref 8–23)
CO2: 29 mmol/L (ref 22–32)
Calcium: 8.1 mg/dL — ABNORMAL LOW (ref 8.9–10.3)
Chloride: 101 mmol/L (ref 98–111)
Creatinine, Ser: 0.33 mg/dL — ABNORMAL LOW (ref 0.44–1.00)
GFR, Estimated: >60 mL/min (ref >60)
Glucose, Bld: 98 mg/dL (ref 70–99)
Phosphorus: 3.6 mg/dL (ref 2.5–4.6)
Potassium: 2.7 mmol/L — CL (ref 3.5–5.1)
Sodium: 141 mmol/L (ref 135–145)

## 2023-09-08 LAB — MAGNESIUM
Magnesium: 1.7 mg/dL (ref 1.7–2.4)
Magnesium: 1.8 mg/dL (ref 1.7–2.4)

## 2023-09-08 MED ORDER — LORAZEPAM 2 MG/ML IJ SOLN
1.0000 mg | INTRAMUSCULAR | Status: DC | PRN
  Administered 2023-09-08: 3 mg via INTRAVENOUS
  Filled 2023-09-08: qty 2

## 2023-09-08 MED ORDER — POTASSIUM CHLORIDE 20 MEQ PO PACK
60.0000 meq | PACK | Freq: Once | ORAL | Status: AC
  Administered 2023-09-08: 60 meq via ORAL
  Filled 2023-09-08: qty 3

## 2023-09-08 MED ORDER — POTASSIUM CHLORIDE CRYS ER 20 MEQ PO TBCR
40.0000 meq | EXTENDED_RELEASE_TABLET | ORAL | Status: AC
  Administered 2023-09-08 (×2): 40 meq via ORAL
  Filled 2023-09-08 (×2): qty 2

## 2023-09-08 MED ORDER — LORAZEPAM 1 MG PO TABS
1.0000 mg | ORAL_TABLET | ORAL | Status: DC | PRN
  Administered 2023-09-08: 2 mg via ORAL
  Filled 2023-09-08: qty 2

## 2023-09-08 MED ORDER — BUSPIRONE HCL 5 MG PO TABS
15.0000 mg | ORAL_TABLET | Freq: Three times a day (TID) | ORAL | Status: DC
  Administered 2023-09-08 – 2023-09-09 (×3): 15 mg via ORAL
  Filled 2023-09-08 (×3): qty 1

## 2023-09-08 MED ORDER — MIRTAZAPINE 15 MG PO TABS
7.5000 mg | ORAL_TABLET | Freq: Every day | ORAL | Status: DC
  Administered 2023-09-08: 7.5 mg via ORAL
  Filled 2023-09-08 (×2): qty 1

## 2023-09-08 MED ORDER — SERTRALINE HCL 100 MG PO TABS
100.0000 mg | ORAL_TABLET | Freq: Every day | ORAL | Status: DC
  Administered 2023-09-09: 100 mg via ORAL
  Filled 2023-09-08: qty 1

## 2023-09-08 NOTE — TOC CM/SW Note (Addendum)
1. 1.3 mi [facility] [address] [place], [place] [address] [phone] Overall rating Much above average 2. 1.6 mi [facility] [address] [place], [place] [address] [phone] Overall rating Above average 3. 2.5 mi [facility] [address] [place], [place] [address] [phone] Overall rating Much above average 4. 4.8 [facility] [address] [place], [place] [address] [phone] Overall rating Much below average 5. 4.8 mi [facility] [address] [place], [place] [address] [phone] Overall rating Above average 6. 5.4 mi [facility] [address] [place], [place] [address] [phone] Overall rating Much below average 7. 6.4 mi [facility] [address] [place], [place] [address] [phone] Overall rating Below average 8. 6.5 mi [place] [facility] [address] [place], [place] [address] [phone] Overall rating Much above average 9. 6.9 mi [facility] [employer] [facility] [address] [place], [place] [address] [phone] Overall rating Average 10. 7 mi [facility] [address] [place], [place] [address] [phone] Overall rating Much below average 11. 7.3 mi [facility] [employer] [employer] [facility] [address] [place], [place] [address] [phone] Overall rating Much above average 12. 7.3 mi [facility] [address] [place], [place] [address] [phone] Overall rating Much below average 13. 7.7 mi [facility] [address] [place], [place] [address] [phone] Overall rating Average 14. 8.3 mi [facility] [address] [place], [place] [address] [phone] Overall  rating Much below average 15. 10 mi [facility] [address] [place], [place] [address] [phone] Overall rating Average 16. 10.1 mi [facility] [address] [place], [place] [address] [phone] Overall rating Much below average 17. 10.3 mi [facility] [address] [place], [place] [address] [phone] Overall rating Much below average 18. 10.4 [facility] [address] [place], [place] [address] [phone] Overall rating Much above average 19. 10.5 mi [facility] [address] [place], [place] [address] [phone] Overall rating Below average 20. 10.7 mi [facility] [address] [place], [place] [address] [phone] Overall rating Below average 21. 10.9 mi [facility] [address] [place], [place] [address] [phone] Overall rating Below average 22. 11.7 mi [facility] [address] [place], [place] [address] [phone] Overall rating Much below average 23. 11.9 mi [name] [address] [place], [place] [address] [phone] Overall rating Much below average 24. 12.4 mi [facility] and [facility] [address] [place], [place] [address] [phone] Overall rating Much above average 25. 14.1 [address] [place], [place] [address] [phone] Overall rating Much above average 26. 16.6 mi [facility] [address] [place], [place] [address] [phone] Overall rating Above average 27. 16.7 mi [facility] [address] [place], [place] [address] [phone] Overall rating Average

## 2023-09-08 NOTE — Plan of Care (Signed)
  Problem: Elimination: Goal: Will not experience complications related to urinary retention Outcome: Progressing   Problem: Pain Managment: Goal: General experience of comfort will improve Outcome: Progressing   Problem: Skin Integrity: Goal: Risk for impaired skin integrity will decrease Outcome: Progressing   Problem: Education: Goal: Knowledge of risk factors and measures for prevention of condition will improve Outcome: Progressing   Problem: Coping: Goal: Psychosocial and spiritual needs will be supported Outcome: Progressing   Problem: Education: Goal: Understanding of discharge needs will improve Outcome: Progressing   Problem: Safety: Goal: Ability to remain free from injury will improve Outcome: Progressing

## 2023-09-08 NOTE — Plan of Care (Signed)
  Problem: Clinical Measurements: Goal: Will remain free from infection Outcome: Progressing   Problem: Activity: Goal: Risk for activity intolerance will decrease Outcome: Progressing   Problem: Safety: Goal: Ability to remain free from injury will improve Outcome: Progressing   

## 2023-09-08 NOTE — TOC Progression Note (Addendum)
Transition of Care [facility]) - Progression Note    Patient Details  Name: Stephanie Riley MRN: [id number] Date of Birth: [birth year]  [facility]) CM/SW Contact  Larrie Kass, LCSW Phone Number: 09/08/2023, 11:40 AM  Clinical Narrative:    CSW has presented bed offers, pt is requesting time to discuss with family. TOC to follow.    Adden 2:20pm CSW spoke with pt, she reports she has not spoken with her family yet about bed offers. TOC to follow.  Expected Discharge Plan: Skilled Nursing Facility Barriers to Discharge: Continued Medical Work up  Expected Discharge Plan and Services In-house Referral: Clinical Social Work                                             Social Determinants of Health (SDOH) Interventions SDOH Screenings   Food Insecurity: No Food Insecurity (09/04/2023)  Housing: Low Risk  (09/04/2023)  Transportation Needs: No Transportation Needs (09/04/2023)  Utilities: Not At Risk (09/04/2023)  Alcohol Screen: High Risk (05/25/2023)  Depression (PHQ2-9): Medium Risk (05/28/2023)  Financial Resource Strain: Low Risk  (10/19/2022)   [facility]  Physical Activity: Insufficiently Active (10/19/2022)   [facility]  Social Connections: Socially Integrated (10/19/2022)   [facility]  Stress: No Stress Concern Present (10/19/2022)   [facility] from [facility]  Tobacco Use: Low Risk  (09/03/2023)    Readmission Risk Interventions     No data to display

## 2023-09-08 NOTE — Progress Notes (Signed)
PT Cancellation Note  Patient Details Name: SAIA DEROSSETT MRN: [id number] DOB: August 07, 1951   Cancelled Treatment:    Reason Eval/Treat Not Completed: Other (comment). Pt getting sponge bath with nurse upon therapist's entry. Pt reports feeling better today but fatiguing with bath. Requests therapist check back tomorrow. Will continue to follow.   Tori Haylie Mccutcheon PT, DPT 09/08/23, 1:23 PM

## 2023-09-08 NOTE — Progress Notes (Signed)
Potassium of 2.7 reported to Chinita Greenland NP

## 2023-09-08 NOTE — Progress Notes (Signed)
[facility] Progress Note Patient: Stephanie Riley UVO:[id number] DOB: 10-21-1951 DOA: 09/03/2023  DOS: the patient was seen and examined on 09/08/2023  Brief hospital course: EVALYNE CORTOPASSI is an 72 y.o. female past medical history significant for essential hypertension and alcohol abuse comes into the emergency room for alcohol intoxication, she separated after 44 years of marriage due to recurrent alcohol abuse, her husband called EMS who found her agitated and vomiting   Significant Events: 09/03/2023 admitted for acute intoxication/alcoholic ketoacidosis   Significant studies: COVID-positive CT of the head showed no acute findings chronic atrophic changes. CT of the C-spine showed no acute findings Chest x-ray showed no acute findings  Assessment and Plan: Alcoholic ketoacidosis/SIRS alcohol disorder: Inebriated on admission started on IV fluids now resolved. Started on CIWA protocol does not require any intervention in the last 72 hours discontinue Ativan. PT OT evaluated the patient will need to go to skilled nursing facility.   Aspiration into airway: No evidence of pneumonia discontinue Unasyn. Has remained afebrile leukocytosis has resolved.  On admission had a temperature of 100.5. Will complete a 5-day course of IV Unasyn.   Incidental COVID-19 viral infection Chest x-ray clear not hypoxic.   Elevated LFTs: Likely due to alcohol use, Madrey function score is low no indication for steroids. LFTs are trending down.   SIRS: Sepsis has been ruled out, started on IV Unasyn due to fevers, with no evidence of infection will complete a 5-day course.   Essential hypertension: Due to poor oral intake and unremarkable blood pressure we will continue to hold antihypertensive medication.   Severe hypokalemia: Continues to be low this morning replete orally .  Try to potassium greater than 4 and magnesium greater than 2. Recheck tomorrow morning.   Hypocalcemia: Stable  corrected for albumin is actually unremarkable.   Severe hypophosphatemia: Level is pending this morning, continue replete orally.  Try to keep phosphorus greater than 3.   Elevated CK nontraumatic: Likely due to immobility, will start on IV fluids and admission.    Subjective: No nausea no vomiting no fever no chills.  Physical Exam: General: in Mild distress, No Rash Cardiovascular: S1 and S2 Present, No Murmur Respiratory: Good respiratory effort, Bilateral Air entry present. No Crackles, No wheezes Abdomen: Bowel Sound present, No tenderness Extremities: No edema Neuro: Alert and oriented x3, no new focal deficit  Data Reviewed: I have Reviewed nursing notes, Vitals, and Lab results. Since last encounter, pertinent lab results CBC and BMP   . I have ordered test including CBC and BMP  .  Disposition: Status is: Inpatient Remains inpatient appropriate because: Monitoring for stability of potassium level.  heparin injection 5,000 Units Start: 09/04/23 0600 SCDs Start: 09/04/23 0122   Family Communication: No one at bedside Level of care: Progressive   Vitals:   09/07/23 1201 09/07/23 2022 09/08/23 0555 09/08/23 1222  BP: (!) 136/95 (!) 137/90 115/75 121/83  Pulse: 99 (!) 102 85 99  Resp: 18 18  20   Temp: 97.9 F (36.6 C) 98.1 F (36.7 C)  97.6 F (36.4 C)  TempSrc: Oral Oral  Oral  SpO2: 99% 100% 98% 96%     Author: Lynden Oxford, MD 09/08/2023 8:42 PM  Please look on [email or web address] to find out who is on call.

## 2023-09-09 DIAGNOSIS — E8729 Other acidosis: Secondary | ICD-10-CM | POA: Diagnosis not present

## 2023-09-09 LAB — RENAL FUNCTION PANEL
Albumin: 3.2 g/dL — ABNORMAL LOW (ref 3.5–5.0)
Anion gap: 12 (ref 5–15)
BUN: 6 mg/dL — ABNORMAL LOW (ref 8–23)
CO2: 28 mmol/L (ref 22–32)
Calcium: 8.9 mg/dL (ref 8.9–10.3)
Chloride: 104 mmol/L (ref 98–111)
Creatinine, Ser: 0.47 mg/dL (ref 0.44–1.00)
GFR, Estimated: >60 mL/min (ref >60)
Glucose, Bld: 89 mg/dL (ref 70–99)
Phosphorus: 4.6 mg/dL (ref 2.5–4.6)
Potassium: 3.9 mmol/L (ref 3.5–5.1)
Sodium: 144 mmol/L (ref 135–145)

## 2023-09-09 LAB — BASIC METABOLIC PANEL
Anion gap: 12 (ref 5–15)
BUN: 6 mg/dL — ABNORMAL LOW (ref 8–23)
CO2: 27 mmol/L (ref 22–32)
Calcium: 8.7 mg/dL — ABNORMAL LOW (ref 8.9–10.3)
Chloride: 104 mmol/L (ref 98–111)
Creatinine, Ser: 0.5 mg/dL (ref 0.44–1.00)
GFR, Estimated: >60 mL/min (ref >60)
Glucose, Bld: 88 mg/dL (ref 70–99)
Potassium: 3.9 mmol/L (ref 3.5–5.1)
Sodium: 143 mmol/L (ref 135–145)

## 2023-09-09 LAB — CBC
HCT: 30 % — ABNORMAL LOW (ref 36.0–46.0)
Hemoglobin: 9.6 g/dL — ABNORMAL LOW (ref 12.0–15.0)
MCH: 30.5 pg (ref 26.0–34.0)
MCHC: 32 g/dL (ref 30.0–36.0)
MCV: 95.2 fL (ref 80.0–100.0)
Platelets: 169 10*3/uL (ref 150–400)
RBC: 3.15 MIL/uL — ABNORMAL LOW (ref 3.87–5.11)
RDW: 21 % — ABNORMAL HIGH (ref 11.5–15.5)
WBC: 5 10*3/uL (ref 4.0–10.5)
nRBC: 0 % (ref 0.0–0.2)

## 2023-09-09 LAB — CULTURE, BLOOD (ROUTINE X 2)
Culture: NO GROWTH
Culture: NO GROWTH
Special Requests: ADEQUATE

## 2023-09-09 LAB — MAGNESIUM: Magnesium: 1.8 mg/dL (ref 1.7–2.4)

## 2023-09-09 MED ORDER — AMLODIPINE BESYLATE 10 MG PO TABS: 10.0000 mg | ORAL_TABLET | Freq: Every day | ORAL | 0 refills | Status: DC

## 2023-09-09 MED ORDER — VITAMIN B-1 100 MG PO TABS: 100.0000 mg | ORAL_TABLET | Freq: Every day | ORAL | 0 refills | Status: AC

## 2023-09-09 MED ORDER — AMOXICILLIN-POT CLAVULANATE 875-125 MG PO TABS: 1.0000 | ORAL_TABLET | Freq: Two times a day (BID) | ORAL | 0 refills | Status: DC

## 2023-09-09 MED ORDER — DM-GUAIFENESIN ER 30-600 MG PO TB12: 1.0000 | ORAL_TABLET | Freq: Two times a day (BID) | ORAL | 0 refills | Status: DC

## 2023-09-09 MED ORDER — AMOXICILLIN-POT CLAVULANATE 875-125 MG PO TABS: 1.0000 | ORAL_TABLET | Freq: Two times a day (BID) | ORAL | Status: DC

## 2023-09-09 MED ORDER — FOLIC ACID 1 MG PO TABS: 1.0000 mg | ORAL_TABLET | Freq: Every day | ORAL | 0 refills | Status: AC

## 2023-09-09 NOTE — TOC Transition Note (Signed)
Transition of Care [facility]) - CM/SW Discharge Note   Patient Details  Name: Stephanie Riley MRN: [id number] Date of Birth: Jan 06, 1951  [facility]) CM/SW Contact:  Larrie Kass, LCSW Phone Number: 09/09/2023, 2:40 PM   Clinical Narrative:    Pt to d/c home with home health services.  [facility] accepted pt for Home Health PT/OT/SW/Aide. [name] stated he will pick pt up upon d/c . No additional TOC needs, TOC sign off.   Final next level of care: Home w Home Health Services Barriers to Discharge: No Barriers Identified   Patient Goals and CMS Choice CMS Medicare.gov Compare Post Acute Care list provided to:: Patient Choice offered to / list presented to : Patient  Discharge Placement                    Name of family member notified: [name], [name] (Spouse)  [phone] [facility] Phone) Patient and family notified of of transfer: 09/09/23  Discharge Plan and Services Additional resources added to the After Visit Summary for   In-house Referral: Clinical Social Work                        HH Arranged: PT, OT, Nurse's Aide, Social Work [employer] Agency: [facility] Date HH Agency Contacted: 09/09/23 Time HH Agency Contacted: [phone] Representative spoke with at [facility] Agency: Tresa Endo  Social Determinants of Health (SDOH) Interventions SDOH Screenings   Food Insecurity: No Food Insecurity (09/04/2023)  Housing: Low Risk  (09/04/2023)  Transportation Needs: No Transportation Needs (09/04/2023)  Utilities: Not At Risk (09/04/2023)  Alcohol Screen: High Risk (05/25/2023)  Depression (PHQ2-9): Medium Risk (05/28/2023)  Financial Resource Strain: Low Risk  (10/19/2022)   Received from [facility], [facility]  Physical Activity: Insufficiently Active (10/19/2022)   Received from [facility], [facility]  Social Connections: Socially Integrated (10/19/2022)   Received from [facility], [facility]  Stress: No Stress Concern Present  (10/19/2022)   Received from [facility], [facility]  Tobacco Use: Low Risk  (09/03/2023)     Readmission Risk Interventions     No data to display

## 2023-09-09 NOTE — TOC CM/SW Note (Signed)
1. [facility] Quality rating Patient survey rating 2. [facility] Quality rating Patient survey rating 3. [facility] Quality rating Patient survey rating 4. [facility] Quality rating Patient survey rating 5. [facility] [phone] Quality rating Patient survey rating 6. [facility] Quality rating Patient survey rating 7. [facility] [phone] Quality rating Patient survey rating 8. [facility] [phone] Quality rating Patient survey rating 9. [facility] Quality rating Patient survey rating 10. [facility] Quality rating Patient survey rating 11 11. [facility] Quality rating Patient survey rating 12. [facility] Quality rating Patient survey rating 13. [facility] Quality rating Patient survey rating 14. [facility] Quality rating Patient survey rating 15. [facility] [phone]

## 2023-09-09 NOTE — TOC Progression Note (Addendum)
Transition of Care [facility]) - Progression Note    Patient Details  Name: Stephanie Riley MRN: [id number] Date of Birth: 04/30/1951  Transition of Care [facility]) CM/SW Contact  Larrie Kass, LCSW Phone Number: 09/09/2023, 9:42 AM  Clinical Narrative:     CSW spoke with pt she has declined SNF placement stating "I dont want to go to another facility.", 'I have some of my strength back." [name] Home Health services. Pt reports no preference for agency. Pt will need Home Health orders CSW to work on arranging home health. CSW inquired about ETOH resources, but the pt declined. CSW has attached OP behavioral health resources/Counseling to pt's AVS. TOC to follow.   ADDEN 10:40am CSW spoke with pt's to discuss possible d/c today. CSW inquired of pt would need transportation assistance at home. Pt stated she has changed her mind and would like SNF placement. Pt have not provided CSW with bed choice. Pt stated she would like to discuss it with her family again. CSW explained the importance of pt choosing a bed today, as insurance authorization must be submitted today. Pt will not allow CSW to speak with her husband about bed offers stating "I will speak with him myself.". TOC to follow.   11:35 am  CSW spoke with pt she stated she has spoken with her husband, and they are waiting to private pay for SNF placement. Pt stated she wanted to check with [facility] and a facility in [place], [place]. CSW informed pt [facility] has declined her for SNF placement. Pt stated she does not remember the name of the facility in [place] and permitted CSW to speak with her husband.    11:42am CSW attempted to call pt's husband, no answer, left VM requesting a return call. TOC to follow.   12:40pm CSW received a call from pt's husband expressing his concerns with pt's bed offers. He is requesting a list of facilities that have declined pt. [name] stated he has a list of 15 facilities he would  like pt's information sent to. CSW explained pt is stable for d/c and will need insurance authorization. CSW explained pt has changed her mind about going to SNF several times. CSW emailed a list of facilities that offer pt a bed. [name] stated he would email CSW a list of facilities he would like pt's information sent to. Pt is awaiting for PT eval, to determine pt's Level of care. TOC to follow.   Expected Discharge Plan and Services                                              Social Determinants of Health (SDOH) Interventions SDOH Screenings   Food Insecurity: No Food Insecurity (09/04/2023)  Housing: Low Risk  (09/04/2023)  Transportation Needs: No Transportation Needs (09/04/2023)  Utilities: Not At Risk (09/04/2023)  Alcohol Screen: High Risk (05/25/2023)  Depression (PHQ2-9): Medium Risk (05/28/2023)  Financial Resource Strain: Low Risk  (10/19/2022)   [facility], [facility]  Physical Activity: Insufficiently Active (10/19/2022)   [facility] from [facility], [facility]  Social Connections: Socially Integrated (10/19/2022)   Received from [facility]  Stress: No Stress Concern Present (10/19/2022)   Received from [facility], [facility]  Tobacco Use: Low Risk  (09/03/2023)    Readmission Risk Interventions     No data to  display

## 2023-09-09 NOTE — Progress Notes (Signed)
Physical Therapy Treatment Patient Details Name: Stephanie Riley MRN: [id number] DOB: October 31, 1951 Today's Date: 09/09/2023   History of Present Illness Pt is 72 yo female admitted on 09/03/23 with acute ETOH intoxication, alcoholic ketoacidosis, and incidental COVID.  Pt ex-husband found her down covered in urine with empty vodka bottle.  There has been concern expressed about pt not being able to care for self at home.  Pt with hx including HTN and ETOH abuse.    PT Comments  Pt ambulated to bathroom and brushed teeth at sink without assist. Pt also able to ambulate into hallway and mild unsteadiness observed however no physical assist required.  Pt also declined to use RW today but states she does have one at home.   Pt prefers to d/c to home and declines SNF for rehab.  She is agreeable to HHPT.  Pt states she is attempting to have sister or daughter agree to stay with her upon d/c for safety (since she points to bruising on arms with fall prior to admission).    If plan is discharge home, recommend the following: Assistance with cooking/housework;Help with stairs or ramp for entrance   Can travel by private vehicle        Equipment Recommendations  None recommended by PT (pt states she has a RW)    Recommendations for Other Services       Precautions / Restrictions Precautions Precautions: Fall     Mobility  Bed Mobility Overal bed mobility: Modified Independent                  Transfers Overall transfer level: Needs assistance Equipment used: None Transfers: Sit to/from Stand, Bed to chair/wheelchair/BSC Sit to Stand: Supervision, Modified independent (Device/Increase time)           General transfer comment: pt's bed alarming on arrival; pt has been pivoting bed <-> BSC due to urgency without assist    Ambulation/Gait Ambulation/Gait assistance: Supervision Gait Distance (Feet): 120 Feet Assistive device: None Gait Pattern/deviations: Step-through  pattern, Decreased stride length       General Gait Details: mildly unsteady however no physical assist required, pt reports first time truly walking since admission; reports generalized weakness   Stairs             Wheelchair Mobility     Tilt Bed    Modified Rankin (Stroke Patients Only)       Balance           Standing balance support: No upper extremity supported Standing balance-Leahy Scale: Good Standing balance comment: able to stand at sink and brush teeth                            Cognition Arousal: Alert Behavior During Therapy: WFL for tasks assessed/performed Overall Cognitive Status: Within Functional Limits for tasks assessed                                          Exercises      General Comments        Pertinent Vitals/Pain Pain Assessment Pain Assessment: No/denies pain    Home Living                          Prior Function  PT Goals (current goals can now be found in the care plan section) Progress towards PT goals: Progressing toward goals    Frequency    Min 1X/week      PT Plan      Co-evaluation              AM-PAC PT "6 Clicks" Mobility   Outcome Measure  Help needed turning from your back to your side while in a flat bed without using bedrails?: None Help needed moving from lying on your back to sitting on the side of a flat bed without using bedrails?: None Help needed moving to and from a bed to a chair (including a wheelchair)?: None Help needed standing up from a chair using your arms (e.g., wheelchair or bedside chair)?: None Help needed to walk in hospital room?: A Little Help needed climbing 3-5 steps with a railing? : A Little 6 Click Score: 22    End of Session   Activity Tolerance: Patient tolerated treatment well Patient left: in bed;with call bell/phone within reach Nurse Communication: Mobility status PT Visit Diagnosis: Muscle  weakness (generalized) (M62.81);Difficulty in walking, not elsewhere classified (R26.2)     Time: 1610-9604 PT Time Calculation (min) (ACUTE ONLY): 18 min  Charges:    $Gait Training: 8-22 mins PT General Charges $$ ACUTE PT VISIT: 1 Visit                     Thomasene Mohair PT, DPT Physical Therapist Acute Rehabilitation Services Office: [phone]    Kati L Payson 09/09/2023, 2:20 PM

## 2023-09-09 NOTE — Plan of Care (Signed)

## 2023-09-13 ENCOUNTER — Telehealth (HOSPITAL_COMMUNITY): Payer: Self-pay | Admitting: Student in an Organized Health Care Education/Training Program

## 2023-09-13 NOTE — Discharge Summary (Signed)
Physician Discharge Summary   Patient: Stephanie Riley MRN: [id number] DOB: 05-04-1951  Admit date:     09/03/2023  Discharge date: {dischdate:26783}  Discharge Physician: Lynden Oxford  PCP: Robby Sermon, DO  Recommendations at discharge:  ***   Follow-up Information     [facility] at [facility]. Go to .   Specialty: Behavioral Health Contact information: [address] [place] [address] [phone]        [facility] Emergency Department at [facility]. Go to .   Specialty: Emergency Medicine Why: As needed, If symptoms worsen Contact information: [address] [place] [address] [phone]        Robby Sermon, DO. Schedule an appointment as soon as possible for a visit in 2 week(s).   Specialty: Geriatric Medicine Contact information: [address] [place] [address] [phone]         [facility] Schedule an appointment as soon as possible for a visit.   Specialty: Urgent Care Why: Follow up Contact information: [address] [place] [address] [phone]        [facility] Follow up.   Specialty: Home Health Services Why: Home Health will follow up with you 24 to 48hrs after discharge. Contact information: [address] [place] [address] [phone]                 *** Unresulted Labs (From admission, onward)    None        Discharge Diagnoses: Principal Problem:   Alcoholic ketoacidosis Active Problems:   SIRS (systemic inflammatory response syndrome) (HCC)   Transaminitis   COVID-19 virus infection   Aspiration into airway   Alcohol use disorder   Lactic acidosis   Elevated CK   Hypokalemia   Hypocalcemia   Essential hypertension   Hypophosphatemia  Hospital Course: Brief hospital  course: ARIAUNA FARABEE is an 72 y.o. female past medical history significant for essential hypertension and alcohol abuse comes into the emergency room for alcohol intoxication, she separated after 44 years of marriage due to recurrent alcohol abuse, her husband called EMS who found her agitated and vomiting   Significant Events: 09/03/2023 admitted for acute intoxication/alcoholic ketoacidosis   Significant studies: COVID-positive CT of the head showed no acute findings chronic atrophic changes. CT of the C-spine showed no acute findings Chest x-ray showed no acute findings  Assessment and Plan: Alcoholic ketoacidosis/SIRS alcohol disorder: Inebriated on admission started on IV fluids now resolved. Started on CIWA protocol does not require any intervention in the last 72 hours discontinue Ativan. PT OT evaluated the patient will need to go to skilled nursing facility.   Aspiration into airway: No evidence of pneumonia discontinue Unasyn. Has remained afebrile leukocytosis has resolved.  On admission had a temperature of 100.5. Will complete a 5-day course of IV Unasyn.   Incidental COVID-19 viral infection Chest x-ray clear not hypoxic.   Elevated LFTs: Likely due to alcohol use, Madrey function score is low no indication for steroids. LFTs are trending down.   SIRS: Sepsis has been ruled out, started on IV Unasyn due to fevers, with no evidence of infection will complete a 5-day course.   Essential hypertension: Due to poor oral intake and unremarkable blood pressure we will continue to hold antihypertensive medication.   Severe hypokalemia: Continues to be low this morning replete orally .  Try to potassium greater than 4 and magnesium greater than 2. Recheck tomorrow morning.   Hypocalcemia: Stable corrected for albumin is actually unremarkable.   Severe hypophosphatemia: Level is pending this morning, continue replete orally.  Try to keep phosphorus greater than 3.    Elevated CK nontraumatic: Likely due to immobility, will start on IV fluids and admission.   {Tip this will not be part of the note when signed There is no height or weight on file to calculate BMI. , ,  (Optional):26781}  {(NOTE) Pain control PDMP Statment (Optional):26782} Consultants:  ***  Procedures performed:  ***  DISCHARGE MEDICATION: Allergies as of 09/09/2023   No Known Allergies      Medication List     TAKE these medications    amLODipine 10 MG tablet Commonly known as: NORVASC Take 1 tablet (10 mg total) by mouth daily.   busPIRone 15 MG tablet Commonly known as: BUSPAR Take 15 mg by mouth 3 (three) times daily.   dextromethorphan-guaiFENesin 30-600 MG 12hr tablet Commonly known as: MUCINEX DM Take 1 tablet by mouth 2 (two) times daily.   folic acid 1 MG tablet Commonly known as: FOLVITE Take 1 tablet (1 mg total) by mouth daily.   mirtazapine 7.5 MG tablet Commonly known as: REMERON Take 1 tablet (7.5 mg total) by mouth at bedtime.   naltrexone 50 MG tablet Commonly known as: DEPADE Take 1 tablet by mouth daily.   sertraline 100 MG tablet Commonly known as: Zoloft Take 1 tablet (100 mg total) by mouth daily.   thiamine 100 MG tablet Commonly known as: Vitamin B-1 Take 1 tablet (100 mg total) by mouth daily.   traZODone 100 MG tablet Commonly known as: DESYREL Take 1 tablet (100 mg total) by mouth at bedtime as needed for sleep.       Disposition: {comingfrom:22515} Diet recommendation: {dietplan:22518}  Discharge Exam: Vitals:   09/09/23 0102 09/09/23 0423 09/09/23 1100 09/09/23 1225  BP: 126/82 (!) 134/91 134/73 (!) 140/93  Pulse: 96 (!) 110 (!) 104 (!) 105  Resp: 18 16  18   Temp: 98.2 F (36.8 C) 98.2 F (36.8 C) 98.1 F (36.7 C) 98.1 F (36.7 C)  TempSrc: Oral Oral Oral Oral  SpO2: 100% 98%  99%   General: Appear in {DEGREE - MILD, MOD, SEV:22033} distress; no visible Abnormal Neck Mass Or lumps, Conjunctiva  normal Cardiovascular: S1 and S2 Present, *** Murmur, Respiratory: {Desc; increased/descreased:10091} respiratory effort, Bilateral Air entry present and ***CTA, *** Crackles, *** wheezes Abdomen: Bowel Sound present, *** Extremities: *** Pedal edema Neurology: {EXAM; NEURO ALERTNESS:23097} and {orientationexam:27336} *** There were no vitals filed for this visit. Condition at discharge: stable  The results of significant diagnostics from this hospitalization (including imaging, microbiology, ancillary and laboratory) are listed below for reference.   Imaging Studies: DG Pelvis Portable  Result Date: 09/03/2023 CLINICAL DATA:  Fall EXAM: PORTABLE PELVIS 1-2 VIEWS COMPARISON:  None Available. FINDINGS: There is no evidence of pelvic fracture or diastasis. No pelvic bone lesions are seen. IMPRESSION: Negative. Electronically Signed   By: Charlett Nose M.D.   On: 09/03/2023 18:20   DG Chest Portable 1 View  Result Date: 09/03/2023 CLINICAL DATA:  Fall EXAM: PORTABLE CHEST 1 VIEW COMPARISON:  05/25/2023 FINDINGS: The heart size and mediastinal contours are within normal limits. Both lungs are clear. The visualized skeletal structures are unremarkable. IMPRESSION: No active disease. Electronically Signed   By: Charlett Nose M.D.   On: 09/03/2023 18:20   CT Cervical  Spine Wo Contrast  Result Date: 09/03/2023 CLINICAL DATA:  Neck trauma (Age >= 65y), fall EXAM: CT CERVICAL SPINE WITHOUT CONTRAST TECHNIQUE: Multidetector CT imaging of the cervical spine was performed without intravenous contrast. Multiplanar CT image reconstructions were also generated. RADIATION DOSE REDUCTION: This exam was performed according to the departmental dose-optimization program which includes automated exposure control, adjustment of the mA and/or kV according to patient size and/or use of iterative reconstruction technique. COMPARISON:  None Available. FINDINGS: Alignment: No subluxation Skull base and vertebrae: No acute  fracture. No primary bone lesion or focal pathologic process. Soft tissues and spinal canal: No prevertebral fluid or swelling. No visible canal hematoma. Disc levels: Diffuse degenerative disc disease with disc space narrowing and spurring. Bilateral degenerative facet disease, left greater than right. Upper chest: No acute findings Other: None IMPRESSION: Degenerative disc and facet disease. No acute bony abnormality. Electronically Signed   By: Charlett Nose M.D.   On: 09/03/2023 18:19   CT HEAD WO CONTRAST  Result Date: 09/03/2023 CLINICAL DATA:  Head trauma EXAM: CT HEAD WITHOUT CONTRAST TECHNIQUE: Contiguous axial images were obtained from the base of the skull through the vertex without intravenous contrast. RADIATION DOSE REDUCTION: This exam was performed according to the departmental dose-optimization program which includes automated exposure control, adjustment of the mA and/or kV according to patient size and/or use of iterative reconstruction technique. COMPARISON:  None Available. FINDINGS: Brain: There is atrophy and chronic small vessel disease changes. No acute intracranial abnormality. Specifically, no hemorrhage, hydrocephalus, mass lesion, acute infarction, or significant intracranial injury. Vascular: No hyperdense vessel or unexpected calcification. Skull: No acute calvarial abnormality. Sinuses/Orbits: No acute findings Other: None IMPRESSION: Atrophy, chronic microvascular disease. No acute intracranial abnormality. Electronically Signed   By: Charlett Nose M.D.   On: 09/03/2023 18:18    Microbiology: Results for orders placed or performed during the hospital encounter of 09/03/23  Blood culture (routine x 2)     Status: None   Collection Time: 09/03/23 11:33 PM   Specimen: BLOOD RIGHT FOREARM  Result Value Ref Range Status   Specimen Description   Final    BLOOD RIGHT FOREARM Performed at [facility], [address], [place], [place] [address]    Special  Requests   Final    BOTTLES DRAWN AEROBIC AND ANAEROBIC Blood Culture adequate volume Performed at [facility], [address] [place], [place] [address]    Culture   Final    NO GROWTH 5 DAYS Performed at [facility], [address], [place], [place] [address]    Report Status 09/09/2023 FINAL  Final  Blood culture (routine x 2)     Status: None   Collection Time: 09/03/23 11:38 PM   Specimen: BLOOD  Result Value Ref Range Status   Specimen Description   Final    BLOOD RIGHT ANTECUBITAL Performed at [facility], [address] [place], [place] [address]    Special Requests   Final    BOTTLES DRAWN AEROBIC AND ANAEROBIC Blood Culture results may not be optimal due to an excessive volume of blood received in culture bottles Performed at [facility], [address], [place], [place] [address]    Culture   Final    NO GROWTH 5 DAYS Performed at [facility], [address], [place], [place] [address]    Report Status 09/09/2023 FINAL  Final  Resp panel by RT-PCR (RSV, Flu A&B, Covid) Anterior  Nasal Swab     Status: Abnormal   Collection Time: 09/03/23 11:51 PM   Specimen: Anterior Nasal Swab  Result Value Ref Range Status   SARS Coronavirus 2 by RT PCR POSITIVE (A) NEGATIVE Final    Comment: (NOTE) SARS-CoV-2 target nucleic acids are DETECTED.  The SARS-CoV-2 RNA is generally detectable in upper respiratory specimens during the acute phase of infection. Positive results are indicative of the presence of the identified virus, but do not rule out bacterial infection or co-infection with other pathogens not detected by the test. Clinical correlation with patient history and other diagnostic information is necessary to determine patient infection status. The expected result is Negative.  Fact Sheet for Patients: BloggerCourse.com  Fact Sheet for Healthcare  Providers: SeriousBroker.it  This test is not yet approved or cleared by the [place] FDA and  has been authorized for detection and/or diagnosis of SARS-CoV-2 by FDA under an Emergency Use Authorization (EUA).  This EUA will remain in effect (meaning this test can be used) for the duration of  the COVID-19 declaration under Section 564(b)(1) of the A ct, 21 U.S.C. section 360bbb-3(b)(1), unless the authorization is terminated or revoked sooner.     Influenza A by PCR NEGATIVE NEGATIVE Final   Influenza B by PCR NEGATIVE NEGATIVE Final    Comment: (NOTE) The Xpert Xpress SARS-CoV-2/FLU/RSV plus assay is intended as an aid in the diagnosis of influenza from Nasopharyngeal swab specimens and should not be used as a sole basis for treatment. Nasal washings and aspirates are unacceptable for Xpert Xpress SARS-CoV-2/FLU/RSV testing.  Fact Sheet for Patients: BloggerCourse.com  Fact Sheet for Healthcare Providers: SeriousBroker.it  This test is not yet approved or cleared by the [place] FDA and has been authorized for detection and/or diagnosis of SARS-CoV-2 by FDA under an Emergency Use Authorization (EUA). This EUA will remain in effect (meaning this test can be used) for the duration of the COVID-19 declaration under Section 564(b)(1) of the Act, 21 U.S.C. section 360bbb-3(b)(1), unless the authorization is terminated or revoked.     Resp Syncytial Virus by PCR NEGATIVE NEGATIVE Final    Comment: (NOTE) Fact Sheet for Patients: BloggerCourse.com  Fact Sheet for Healthcare Providers: SeriousBroker.it  This test is not yet approved or cleared by the [place] FDA and has been authorized for detection and/or diagnosis of SARS-CoV-2 by FDA under an Emergency Use Authorization (EUA). This EUA will remain in effect (meaning this test can be  used) for the duration of the COVID-19 declaration under Section 564(b)(1) of the Act, 21 U.S.C. section 360bbb-3(b)(1), unless the authorization is terminated or revoked.  Performed at [facility], [address], [place], [place] [address]    Labs: CBC: Recent Labs  Lab 09/07/23 0720 09/09/23 0415  WBC 4.8 5.0  HGB 9.9* 9.6*  HCT 30.0* 30.0*  MCV 90.9 95.2  PLT 123* 169   Basic Metabolic Panel: Recent Labs  Lab 09/07/23 0720 09/08/23 0610 09/08/23 0611 09/08/23 1439 09/09/23 0415 09/09/23 0416  NA 137 141  --  138 143 144  K 2.3* 2.7*  --  4.2 3.9 3.9  CL 97* 101  --  101 104 104  CO2 32 29  --  26 27 28   GLUCOSE 109* 98  --  114* 88 89  BUN 5* <5*  --  6* 6* 6*  CREATININE 0.40* 0.33*  --  0.49 0.50 0.47  CALCIUM 8.1* 8.1*  --  8.7* 8.7* 8.9  MG 1.7  --  1.7 1.8 1.8  --   PHOS 2.2* 3.6  --   --   --  4.6   Liver Function Tests: Recent Labs  Lab 09/08/23 0610 09/09/23 0416  ALBUMIN 3.0* 3.2*   CBG: No results for input(s): "GLUCAP" in the last 168 hours.  Discharge time spent: {LESS THAN/GREATER MVHQ:46962} 30 minutes.  Author: Lynden Oxford, MD  [facility] 09/09/2023

## 2023-09-15 NOTE — Discharge Summary (Incomplete)
Physician Discharge Summary   Patient: Stephanie Riley MRN: [id number] DOB: 09-Jan-1951  Admit date:     09/03/2023  Discharge date: 09/09/2023  Discharge Physician: Lynden Oxford  PCP: Robby Sermon, DO  Recommendations at discharge:  Follow up with PCP in 1 week   Follow-up Information     [facility]. Go to .   Specialty: Behavioral Health Contact information: [address] [place] [address] [phone]        [facility] Emergency Department at [facility]. Go to .   Specialty: Emergency Medicine Why: As needed, If symptoms worsen Contact information: [address] [place] [address] [phone]        Robby Sermon, DO. Schedule an appointment as soon as possible for a visit in 2 week(s).   Specialty: Geriatric Medicine Contact information: [address] [place] [address] [phone]         [facility]. Schedule an appointment as soon as possible for a visit.   Specialty: Urgent Care Why: Follow up Contact information: [address] [place] [address] [phone]        [facility] Follow up.   Specialty: Home Health Services Why: Home Health will follow up with you 24 to 48hrs after discharge. Contact information: [address] [place] [address] [phone]                Discharge Diagnoses: Principal Problem:   Alcoholic ketoacidosis Active Problems:   SIRS (systemic inflammatory response syndrome) (HCC)   Transaminitis   COVID-19 virus infection   Aspiration into airway   Alcohol use disorder   Lactic acidosis   Elevated CK   Hypokalemia   Hypocalcemia   Essential hypertension   Hypophosphatemia  Brief hospital course: Stephanie Riley is an 72 y.o. female past medical history significant for  essential hypertension and alcohol abuse comes into the emergency room for alcohol intoxication, she separated after 44 years of marriage due to recurrent alcohol abuse, her husband called EMS who found her agitated and vomiting   Significant Events: 09/03/2023 admitted for acute intoxication/alcoholic ketoacidosis   Significant studies: COVID-positive CT of the head showed no acute findings chronic atrophic changes. CT of the C-spine showed no acute findings Chest x-ray showed no acute findings  Assessment and Plan: Alcoholic ketoacidosis/SIRS alcohol disorder: Inebriated on admission started on IV fluids now resolved. Started on CIWA protocol does not require any intervention in the last 72 hours discontinue Ativan. PT OT evaluated the patient will need to go to skilled nursing facility.   Aspiration into airway: No evidence of pneumonia discontinue Unasyn. Has remained afebrile leukocytosis has resolved.  On admission had a temperature of 100.5. Completed a 5-day course of IV Unasyn.   Incidental COVID-19 viral infection Chest x-ray clear not hypoxic.   Elevated LFTs: Likely due to alcohol use, Madrey function score is low no indication for steroids. LFTs are trending down.   SIRS: Sepsis has been ruled out, started on IV Unasyn due to fevers, with no evidence of infection will complete a 5-day course.   Essential hypertension: Due to poor oral intake and unremarkable blood pressure we will continue to hold antihypertensive medication.   Severe hypokalemia: Continues to be low this morning replete orally .  Try to potassium greater than 4 and magnesium greater than 2. Recheck tomorrow morning.  Hypocalcemia: Stable corrected for albumin is actually unremarkable.   Severe hypophosphatemia: Level is pending this morning, continue replete orally.  Try to keep phosphorus greater than 3.   Elevated CK nontraumatic: Likely due to immobility, will start on IV fluids and  admission.   {Tip this will not be part of the note when signed There is no height or weight on file to calculate BMI. , ,  (Optional):26781}  {(NOTE) Pain control PDMP Statment (Optional):26782} Consultants:  ***  Procedures performed:  ***  DISCHARGE MEDICATION: Allergies as of 09/09/2023   No Known Allergies      Medication List     TAKE these medications    amLODipine 10 MG tablet Commonly known as: NORVASC Take 1 tablet (10 mg total) by mouth daily.   busPIRone 15 MG tablet Commonly known as: BUSPAR Take 15 mg by mouth 3 (three) times daily.   dextromethorphan-guaiFENesin 30-600 MG 12hr tablet Commonly known as: MUCINEX DM Take 1 tablet by mouth 2 (two) times daily.   folic acid 1 MG tablet Commonly known as: FOLVITE Take 1 tablet (1 mg total) by mouth daily.   mirtazapine 7.5 MG tablet Commonly known as: REMERON Take 1 tablet (7.5 mg total) by mouth at bedtime.   naltrexone 50 MG tablet Commonly known as: DEPADE Take 1 tablet by mouth daily.   sertraline 100 MG tablet Commonly known as: Zoloft Take 1 tablet (100 mg total) by mouth daily.   thiamine 100 MG tablet Commonly known as: Vitamin B-1 Take 1 tablet (100 mg total) by mouth daily.   traZODone 100 MG tablet Commonly known as: DESYREL Take 1 tablet (100 mg total) by mouth at bedtime as needed for sleep.       Disposition: {comingfrom:22515} Diet recommendation: {dietplan:22518}  Discharge Exam: Vitals:   09/09/23 0102 09/09/23 0423 09/09/23 1100 09/09/23 1225  BP: 126/82 (!) 134/91 134/73 (!) 140/93  Pulse: 96 (!) 110 (!) 104 (!) 105  Resp: 18 16  18   Temp: 98.2 F (36.8 C) 98.2 F (36.8 C) 98.1 F (36.7 C) 98.1 F (36.7 C)  TempSrc: Oral Oral Oral Oral  SpO2: 100% 98%  99%   General: Appear in {DEGREE - MILD, MOD, SEV:22033} distress; no visible Abnormal Neck Mass Or lumps, Conjunctiva normal Cardiovascular: S1 and S2 Present, *** Murmur, Respiratory: {Desc;  increased/descreased:10091} respiratory effort, Bilateral Air entry present and ***CTA, *** Crackles, *** wheezes Abdomen: Bowel Sound present, *** Extremities: *** Pedal edema Neurology: {EXAM; NEURO ALERTNESS:23097} and {orientationexam:27336} *** There were no vitals filed for this visit. Condition at discharge: stable  The results of significant diagnostics from this hospitalization (including imaging, microbiology, ancillary and laboratory) are listed below for reference.   Imaging Studies: DG Pelvis Portable  Result Date: 09/03/2023 CLINICAL DATA:  Fall EXAM: PORTABLE PELVIS 1-2 VIEWS COMPARISON:  None Available. FINDINGS: There is no evidence of pelvic fracture or diastasis. No pelvic bone lesions are seen. IMPRESSION: Negative. Electronically Signed   By: Charlett Nose M.D.   On: 09/03/2023 18:20   DG Chest Portable 1 View  Result Date: 09/03/2023 CLINICAL DATA:  Fall EXAM: PORTABLE CHEST 1 VIEW COMPARISON:  05/25/2023 FINDINGS: The heart size and mediastinal contours are within normal limits. Both lungs are clear. The visualized skeletal structures are unremarkable. IMPRESSION: No active disease. Electronically Signed   By: Charlett Nose M.D.   On: 09/03/2023 18:20   CT Cervical Spine Wo Contrast  Result Date: 09/03/2023 CLINICAL DATA:  Neck trauma (Age >= 65y), fall  EXAM: CT CERVICAL SPINE WITHOUT CONTRAST TECHNIQUE: Multidetector CT imaging of the cervical spine was performed without intravenous contrast. Multiplanar CT image reconstructions were also generated. RADIATION DOSE REDUCTION: This exam was performed according to the departmental dose-optimization program which includes automated exposure control, adjustment of the mA and/or kV according to patient size and/or use of iterative reconstruction technique. COMPARISON:  None Available. FINDINGS: Alignment: No subluxation Skull base and vertebrae: No acute fracture. No primary bone lesion or focal pathologic process. Soft tissues  and spinal canal: No prevertebral fluid or swelling. No visible canal hematoma. Disc levels: Diffuse degenerative disc disease with disc space narrowing and spurring. Bilateral degenerative facet disease, left greater than right. Upper chest: No acute findings Other: None IMPRESSION: Degenerative disc and facet disease. No acute bony abnormality. Electronically Signed   By: Charlett Nose M.D.   On: 09/03/2023 18:19   CT HEAD WO CONTRAST  Result Date: 09/03/2023 CLINICAL DATA:  Head trauma EXAM: CT HEAD WITHOUT CONTRAST TECHNIQUE: Contiguous axial images were obtained from the base of the skull through the vertex without intravenous contrast. RADIATION DOSE REDUCTION: This exam was performed according to the departmental dose-optimization program which includes automated exposure control, adjustment of the mA and/or kV according to patient size and/or use of iterative reconstruction technique. COMPARISON:  None Available. FINDINGS: Brain: There is atrophy and chronic small vessel disease changes. No acute intracranial abnormality. Specifically, no hemorrhage, hydrocephalus, mass lesion, acute infarction, or significant intracranial injury. Vascular: No hyperdense vessel or unexpected calcification. Skull: No acute calvarial abnormality. Sinuses/Orbits: No acute findings Other: None IMPRESSION: Atrophy, chronic microvascular disease. No acute intracranial abnormality. Electronically Signed   By: Charlett Nose M.D.   On: 09/03/2023 18:18    Microbiology: Results for orders placed or performed during the hospital encounter of 09/03/23  Blood culture (routine x 2)     Status: None   Collection Time: 09/03/23 11:33 PM   Specimen: BLOOD RIGHT FOREARM  Result Value Ref Range Status   Specimen Description   Final    BLOOD RIGHT FOREARM Performed at [facility], [address] [place], [place] [address]    Special Requests   Final    BOTTLES DRAWN AEROBIC AND ANAEROBIC Blood Culture  adequate volume Performed at [facility], [address] [place], [place] [address]    Culture   Final    NO GROWTH 5 DAYS Performed at [facility], [address], [place], [place] [address]    Report Status 09/09/2023 FINAL  Final  Blood culture (routine x 2)     Status: None   Collection Time: 09/03/23 11:38 PM   Specimen: BLOOD  Result Value Ref Range Status   Specimen Description   Final    BLOOD RIGHT ANTECUBITAL Performed at [facility], [address], [place], [place] [address]    Special Requests   Final    BOTTLES DRAWN AEROBIC AND ANAEROBIC Blood Culture results may not be optimal due to an excessive volume of blood received in culture bottles Performed at [facility], [address] [place], [place] [address]    Culture   Final    NO GROWTH 5 DAYS Performed at [facility], [address] [place], [place] [address]    Report Status 09/09/2023 FINAL  Final  Resp panel by RT-PCR (RSV, Flu A&B, Covid) Anterior Nasal Swab     Status: Abnormal   Collection Time: 09/03/23 11:51 PM  Specimen: Anterior Nasal Swab  Result Value Ref Range Status   SARS Coronavirus 2 by RT PCR POSITIVE (A) NEGATIVE Final    Comment: (NOTE) SARS-CoV-2 target nucleic acids are DETECTED.  The SARS-CoV-2 RNA is generally detectable in upper respiratory specimens during the acute phase of infection. Positive results are indicative of the presence of the identified virus, but do not rule out bacterial infection or co-infection with other pathogens not detected by the test. Clinical correlation with patient history and other diagnostic information is necessary to determine patient infection status. The expected result is Negative.  Fact Sheet for Patients: BloggerCourse.com  Fact Sheet for Healthcare Providers: SeriousBroker.it  This test is not yet approved or cleared by the  [place] FDA and  has been authorized for detection and/or diagnosis of SARS-CoV-2 by FDA under an Emergency Use Authorization (EUA).  This EUA will remain in effect (meaning this test can be used) for the duration of  the COVID-19 declaration under Section 564(b)(1) of the A ct, 21 U.S.C. section 360bbb-3(b)(1), unless the authorization is terminated or revoked sooner.     Influenza A by PCR NEGATIVE NEGATIVE Final   Influenza B by PCR NEGATIVE NEGATIVE Final    Comment: (NOTE) The Xpert Xpress SARS-CoV-2/FLU/RSV plus assay is intended as an aid in the diagnosis of influenza from Nasopharyngeal swab specimens and should not be used as a sole basis for treatment. Nasal washings and aspirates are unacceptable for Xpert Xpress SARS-CoV-2/FLU/RSV testing.  Fact Sheet for Patients: BloggerCourse.com  Fact Sheet for Healthcare Providers: SeriousBroker.it  This test is not yet approved or cleared by the [place] FDA and has been authorized for detection and/or diagnosis of SARS-CoV-2 by FDA under an Emergency Use Authorization (EUA). This EUA will remain in effect (meaning this test can be used) for the duration of the COVID-19 declaration under Section 564(b)(1) of the Act, 21 U.S.C. section 360bbb-3(b)(1), unless the authorization is terminated or revoked.     Resp Syncytial Virus by PCR NEGATIVE NEGATIVE Final    Comment: (NOTE) Fact Sheet for Patients: BloggerCourse.com  Fact Sheet for Healthcare Providers: SeriousBroker.it  This test is not yet approved or cleared by the [place] FDA and has been authorized for detection and/or diagnosis of SARS-CoV-2 by FDA under an Emergency Use Authorization (EUA). This EUA will remain in effect (meaning this test can be used) for the duration of the COVID-19 declaration under Section 564(b)(1) of the Act, 21 U.S.C. section  360bbb-3(b)(1), unless the authorization is terminated or revoked.  Performed at [facility], [address], [place], [place] [address]    Labs: CBC: Recent Labs  Lab 09/07/23 0720 09/09/23 0415  WBC 4.8 5.0  HGB 9.9* 9.6*  HCT 30.0* 30.0*  MCV 90.9 95.2  PLT 123* 169   Basic Metabolic Panel: Recent Labs  Lab 09/07/23 0720 09/08/23 0610 09/08/23 0611 09/08/23 1439 09/09/23 0415 09/09/23 0416  NA 137 141  --  138 143 144  K 2.3* 2.7*  --  4.2 3.9 3.9  CL 97* 101  --  101 104 104  CO2 32 29  --  26 27 28   GLUCOSE 109* 98  --  114* 88 89  BUN 5* <5*  --  6* 6* 6*  CREATININE 0.40* 0.33*  --  0.49 0.50 0.47  CALCIUM 8.1* 8.1*  --  8.7* 8.7* 8.9  MG 1.7  --  1.7 1.8 1.8  --   PHOS 2.2* 3.6  --   --   --  4.6   Liver Function Tests: Recent Labs  Lab 09/08/23 0610 09/09/23 0416  ALBUMIN 3.0* 3.2*   CBG: No results for input(s): "GLUCAP" in the last 168 hours.  Discharge time spent: {LESS THAN/GREATER UEAV:40981} 30 minutes.  Author: Lynden Oxford, MD  [facility] 09/09/2023

## 2023-09-17 ENCOUNTER — Encounter (HOSPITAL_COMMUNITY): Payer: Self-pay | Admitting: Student in an Organized Health Care Education/Training Program

## 2023-09-17 ENCOUNTER — Ambulatory Visit (HOSPITAL_COMMUNITY): Payer: Medicare PPO | Admitting: Student in an Organized Health Care Education/Training Program

## 2023-09-17 VITALS — BP 144/87 | HR 94 | Wt 154.0 lb

## 2023-09-17 DIAGNOSIS — F332 Major depressive disorder, recurrent severe without psychotic features: Secondary | ICD-10-CM

## 2023-09-17 DIAGNOSIS — F1020 Alcohol dependence, uncomplicated: Secondary | ICD-10-CM

## 2023-09-17 DIAGNOSIS — F411 Generalized anxiety disorder: Secondary | ICD-10-CM

## 2023-09-17 MED ORDER — SERTRALINE HCL 100 MG PO TABS
200.0000 mg | ORAL_TABLET | Freq: Every day | ORAL | 1 refills | Status: DC
Start: 2023-09-17 — End: 2023-10-22

## 2023-09-17 MED ORDER — TRAZODONE HCL 50 MG PO TABS: 100.0000 mg | ORAL_TABLET | Freq: Every day | ORAL | 1 refills | Status: DC

## 2023-09-17 MED ORDER — MIRTAZAPINE 45 MG PO TBDP
45.0000 mg | ORAL_TABLET | Freq: Every day | ORAL | 1 refills | Status: DC
Start: 2023-09-17 — End: 2023-10-07

## 2023-09-17 MED ORDER — BUSPIRONE HCL 15 MG PO TABS
15.0000 mg | ORAL_TABLET | Freq: Three times a day (TID) | ORAL | 1 refills | Status: DC
Start: 2023-09-17 — End: 2023-10-22

## 2023-09-17 NOTE — Progress Notes (Signed)
BH MD/PA/NP OP Progress Note  09/17/2023 1:56 PM Stephanie Riley  MRN:  [id number]  Chief Complaint:  Chief Complaint  Patient presents with   Follow-up   HPI: Stephanie Riley is a 72 yo patient with a PPH of MDD, Etoh use disorder, self-reported ADHD, anxiety, and chronic prescribed BZD use.  Current regimen:    - Remeron 22mg  QHS - Buspar 15mg  TID - Naltrexone 50mg  daily, rx by PCP - Zoloft 200mg  daily - Trazodne 150mg  at bedtime  Patient reports that she has not wanted to drink since being dc'd from the [facility] hospital. Patient reports that she really wants to "protect her sobriety" and getting sleep helps her. Patient reports that she has been feeling very down depressed and hopeless. Patient reports that she has fully accepted she has anxiety. Patient reports that her anxiety has been very severe and that her separation is her biggest stressor. Patient reports that she is worried about being pushed out of the home and having to sale home. Patient reports that not having a plan for the future is making her anxious. Her husband has suggested she more to [facility]. Patient reports that her sister is coming this weekend to to visit some. Patient reports that she has been trying to adjust to the idea of moving, she reports that he is struggling.   Patient reports that she has been praying more and this has been helpful. Patient reports having more conversations with her sister more. Patient reports that she is trying to find more positive in her changes. Patient reports that her daughter is also a good support. Patient reports feeling less hopeless. Patient reports she is able to recognize more support in her life. Patient reports that she is less shocked about the separation and the intervention her family staged. Patient reports that she is now interested in slowly getting back in the gym. She may consider PT first. Patient reports that right now she is not able to drive, since recovering  from her Etoh use.   Patient reports that her appetite has significant improvement. Patient reports that she is eating eating 1-2 larger meals, and likes to snack on vegetables. Patient denies forcing herself to vomit. Patient denies SI, HI, and AVH. Patient  denies feeling paranoid and delusions. Patinet reports that she does feel very irritable. Patient finds the Remeron most beneficial for decreasing her anxiety and depression. Patient is making an appt at [facility] for therapy both couples therapy and individual therapy.  Visit Diagnosis:    ICD-10-CM   1. Alcohol use disorder, moderate, dependence (HCC)  F10.20     2. GAD (generalized anxiety disorder)  F41.1 sertraline (ZOLOFT) 100 MG tablet    3. Severe episode of recurrent major depressive disorder, without psychotic features (HCC)  F33.2 mirtazapine (REMERON SOLTAB) 45 MG disintegrating tablet    busPIRone (BUSPAR) 15 MG tablet    sertraline (ZOLOFT) 100 MG tablet    traZODone (DESYREL) 50 MG tablet      Past Psychiatric History: OPT: Hx of Adderall, Zoloft, Trazodone, Wellbutrin, Xanax, Klonopin, no psychiatry opt 12/2022- INPT at [facility] , patient noted to have yellow/orange sclera on arrival, but cleared by discharge. Dx with Etoh use d/o,. MOCA was done due to cognitive concerns, scored 23/30. 2nd hosp in 2024- [facility], BZD withdrawal Dx: MDD, Etoh use disorder, ADHD, anxiety, and chronic prescribed BZD use. Therapy: marriage counseling 03/2023- Sent to [facility], for Etoh detox. Patient was already approx 5 days sober from  BZDs but still heavily drinking. In [facility] patient had hyperbilirubinemia,  hypokalemia and UTI and was treated for this. She was also placed on Librium taper, patient tolerated well  03/2023- 09/2023. Patient did not f/u with [facility]. Patient presented to [facility] and medical ED once prior to admission in 08/2023 related to etoh use d/o. Patient did go to [facility] over the summer at [facility] in [place]  and did complete the program after this 05/2023 appearance to the ED. Patient was dc'd from [facility] 09/09/2023 for Etoh use d/o, Alcoholic ketoacidosis, elevated CK, aspiration, elevated LFTs, SIRS, hypocalcemia and hypophosphatemia, HTN and has had a f/u with her PCP and has fungal dermatitis. She was dc'd with instructions to continue remeron 7.5mg  at bedtime, zoloft 100mg  daily, naltrexone 50mg  daily, buspar 15mg  tid, trazodone 100mg  at bedtime.  Past Medical History:  Past Medical History:  Diagnosis Date   Alcohol abuse    Benzodiazepine withdrawal without complication (HCC) 02/12/2023   HTN (hypertension)    No past surgical history on file.  Family Psychiatric History: Both parents had Alzhmier's   Family History: No family history on file.  Social History:  Social History   Socioeconomic History   Marital status: Married    Spouse name: Not on file   Number of children: Not on file   Years of education: Not on file   Highest education level: Not on file  Occupational History   Not on file  Tobacco Use   Smoking status: Never   Smokeless tobacco: Never  Substance and Sexual Activity   Alcohol use: Yes   Drug use: Never   Sexual activity: Not Currently  Other Topics Concern   Not on file  Social History Narrative   Not on file   Social Determinants of Health   Financial Resource Strain: Low Risk  (10/19/2022)   Received from [facility], [facility]   Overall Financial Resource Strain (CARDIA)    Difficulty of Paying Living Expenses: Not hard at all  Food Insecurity: No Food Insecurity (09/04/2023)   Hunger Vital Sign    Worried About Running Out of Food in the Last Year: Never true    Ran Out of Food in the Last Year: Never true  Transportation Needs: No Transportation Needs (09/04/2023)   PRAPARE - Administrator, Civil Service (Medical): No    Lack of Transportation (Non-Medical): No  Physical Activity: Insufficiently Active (10/19/2022)   Received  from [facility], [facility]   Exercise Vital Sign    Days of Exercise per Week: 3 days    Minutes of Exercise per Session: 20 min  Stress: No Stress Concern Present (10/19/2022)   Received from [facility], [facility] Occupational [facility] - Occupational Stress Questionnaire    Feeling of Stress : Not at all  Social Connections: Socially Integrated (10/19/2022)   Received from [facility], [facility]   Social Network    How would you rate your social network (family, work, friends)?: Good participation with social networks    Allergies: No Known Allergies  Metabolic Disorder Labs: Lab Results  Component Value Date   HGBA1C 4.9 05/25/2023   MPG 93.93 05/25/2023   No results found for: "PROLACTIN" Lab Results  Component Value Date   CHOL 145 05/25/2023   TRIG 65 05/25/2023   HDL 91 05/25/2023   CHOLHDL 1.6 05/25/2023   VLDL 13 05/25/2023   LDLCALC 41 05/25/2023   Lab Results  Component  Value Date   TSH 1.830 05/25/2023   TSH 3.115 02/11/2023    Therapeutic Level Labs: No results found for: "LITHIUM" No results found for: "VALPROATE" No results found for: "CBMZ"  Current Medications: Current Outpatient Medications  Medication Sig Dispense Refill   mirtazapine (REMERON SOLTAB) 45 MG disintegrating tablet Take 1 tablet (45 mg total) by mouth at bedtime. 30 tablet 1   traZODone (DESYREL) 50 MG tablet Take 2 tablets (100 mg total) by mouth at bedtime. Can take 100-150mg  90 tablet 1   amLODipine (NORVASC) 10 MG tablet Take 1 tablet (10 mg total) by mouth daily. 30 tablet 0   busPIRone (BUSPAR) 15 MG tablet Take 1 tablet (15 mg total) by mouth 3 (three) times daily. 90 tablet 1   dextromethorphan-guaiFENesin (MUCINEX DM) 30-600 MG 12hr tablet Take 1 tablet by mouth 2 (two) times daily. 30 tablet 0   folic acid (FOLVITE) 1 MG tablet Take 1 tablet (1 mg total) by mouth daily. 30 tablet 0   mirtazapine (REMERON) 7.5 MG tablet Take 1 tablet  (7.5 mg total) by mouth at bedtime. 90 tablet 0   naltrexone (DEPADE) 50 MG tablet Take 1 tablet by mouth daily.     sertraline (ZOLOFT) 100 MG tablet Take 2 tablets (200 mg total) by mouth daily. 60 tablet 1   thiamine (VITAMIN B-1) 100 MG tablet Take 1 tablet (100 mg total) by mouth daily. 90 tablet 0   traZODone (DESYREL) 100 MG tablet Take 1 tablet (100 mg total) by mouth at bedtime as needed for sleep. 30 tablet 1   No current facility-administered medications for this visit.     Musculoskeletal: Strength & Muscle Tone: decreased Gait & Station: ataxic Patient leans: Front  Psychiatric Specialty Exam: Review of Systems  Psychiatric/Behavioral:  Positive for dysphoric mood and sleep disturbance. Negative for hallucinations and suicidal ideas. The patient is nervous/anxious.     Blood pressure (!) 144/87, pulse 94, weight 154 lb (69.9 kg), SpO2 96%.Body mass index is 24.86 kg/m.  General Appearance: Disheveled  Eye Contact:  Good  Speech:  Clear and Coherent  Volume:  Normal  Mood:  Euthymic  Affect:  Appropriate  Thought Process:  Coherent  Orientation:  Full (Time, Place, and Person)  Thought Content: Logical   Suicidal Thoughts:  No  Homicidal Thoughts:  No  Memory:  Immediate;   Good Recent;   Good  Judgement:  Fair  Insight:  Fair  Psychomotor Activity:  Normal  Concentration:  Concentration: Good  Recall:  Fair  Fund of Knowledge: Good  Language: Good  Akathisia:  No  Handed:    AIMS (if indicated): not done  Assets:  Communication Skills Desire for Improvement Financial Resources/Insurance Housing Leisure Time Resilience Social Support  ADL's:  Intact  Cognition: WNL  Sleep:  Good   Screenings: AUDIT    Flowsheet Row ED from 05/25/2023 in [facility] Emergency Department at [facility]  Alcohol Use Disorder Identification Test Final Score (AUDIT) 27      PHQ2-9    Flowsheet Row ED from 05/25/2023 in [facility] ED from 03/19/2023 in [facility] ED from 02/11/2023 in [place]  PHQ-2 Total Score 6 3 1   PHQ-9 Total Score 9 3 4       Flowsheet Row ED to Hosp-Admission (Discharged) from 09/03/2023 in [place] LONG 4TH FLOOR PROGRESSIVE CARE AND UROLOGY Most recent reading at 09/04/2023  5:00 AM ED from 05/25/2023 in  [facility] Most recent reading at 05/25/2023  5:34 PM ED from 05/25/2023 in [facility] Most recent reading at 05/25/2023  4:27 PM  C-SSRS RISK CATEGORY No Risk No Risk No Risk        Assessment and Plan: Based on assessment today, patient has been sober from EtOH since her hospitalization 09/03/2023.  Patient insight has improved that she is endorsing feeling that her sobriety is very important to her and that she is willing to do more to make sure she is able to maintain sobriety.  Patient is endorsing working to change her mindset and feeling as though she has more control over her life.  Although patient does not like a lot of the changes going on she is trying to adjust and is able to identify a good support system.  Patient also was able to identify medications that had been more beneficial for her including mirtazapine for both her depressive and anxiety symptoms.  We will increase patient's Remeron to 45 mg, educated patient that she cannot take more than 45 mg within 24 hours due to the risk for increased sedation and other adverse side effects.  Patient endorsed understanding.  Patient endorses that approximately 22 mg of Remeron did not sedate her however she finds trazodone very beneficial for sedation especially when she is not drinking alcohol and this is important to her.  EtOH use disorder, severe in early remission MDD, recurrent, severe GAD - Continue Zoloft 200 mg daily, anxiety and depression - Continue trazodone 100 as 150 mg nightly, insomnia - Increase  Remeron to 45 mg nightly - Continue BuSpar 15 mg twice daily, anxiety - Discussed with patient that her PCP should continue prescription for naltrexone given that PCP has easier access to labs, patient's liver enzymes should be tracked  Collaboration of Care: Collaboration of Care:   Patient/Guardian was advised Release of Information must be obtained prior to any record release in order to collaborate their care with an outside provider. Patient/Guardian was advised if they have not already done so to contact the registration department to sign all necessary forms in order for [place] to release information regarding their care.   Consent: Patient/Guardian gives verbal consent for treatment and assignment of benefits for services provided during this visit. Patient/Guardian expressed understanding and agreed to proceed.   PGY-4 [name], MD 09/17/2023, 1:56 PM

## 2023-10-05 ENCOUNTER — Other Ambulatory Visit (HOSPITAL_COMMUNITY): Payer: Self-pay | Admitting: Student in an Organized Health Care Education/Training Program

## 2023-10-05 ENCOUNTER — Telehealth (HOSPITAL_COMMUNITY): Payer: Self-pay | Admitting: *Deleted

## 2023-10-05 NOTE — Progress Notes (Signed)
Patient reports that she has started seeing a therapist, and had her first appt. Yesterday and it went well. Patient reports that she does not plan to drink again, because she knows she will likely die. Patient reports that she also thinks that that naltrexone helps with the urges. Patient reports she has been walking some and was realeased from PT today.   Discuss with patient working on getting to AA and continuing to walk. Patient endorsed looking forward to more walking. Patient denies SI and AVH and relapse on Etoh.

## 2023-10-05 NOTE — Telephone Encounter (Signed)
Patient is having severe anxiety and asking if there is something else she can take because her Remeron is not working. She is asking for a return call to discuss possibly adding Abilify or whatever else she (Dr. Gerlean Ren) might suggest. She knows that she can't stop Remeron "cold [place]" but not sure what she should do.  She only has 3 pills left because she was taking 1.5 pills to help save her sobriety.

## 2023-10-06 ENCOUNTER — Telehealth (HOSPITAL_COMMUNITY): Payer: Self-pay | Admitting: *Deleted

## 2023-10-06 NOTE — Telephone Encounter (Signed)
Patient called again today and states that she was taking 1.5 pills of Remeron and is now out early asking for a refill. States that she can't stop it cold [place] and needs an early refill. Patient states this has her nerves messed up and she is afraid of stopping it without having anything to help her sleep.

## 2023-10-07 ENCOUNTER — Other Ambulatory Visit (HOSPITAL_COMMUNITY): Payer: Self-pay | Admitting: Student in an Organized Health Care Education/Training Program

## 2023-10-07 ENCOUNTER — Telehealth (HOSPITAL_COMMUNITY): Payer: Self-pay | Admitting: *Deleted

## 2023-10-07 DIAGNOSIS — F332 Major depressive disorder, recurrent severe without psychotic features: Secondary | ICD-10-CM

## 2023-10-07 MED ORDER — MIRTAZAPINE 45 MG PO TBDP
45.0000 mg | ORAL_TABLET | Freq: Every day | ORAL | 0 refills | Status: DC
Start: 2023-10-07 — End: 2023-10-08

## 2023-10-07 NOTE — Telephone Encounter (Signed)
Patient called and states she is very thankful for the Remeron refill but her MD has called it in to the wrong pharmacy. It went to [facility] and [facility] needs it at [facility] [address] in [place]. It was out of stock at [facility] well but [facility] in [facility].

## 2023-10-07 NOTE — Progress Notes (Signed)
A one time refill of 11 tablets of Mirtazapine 45mg  at bedtime was sent in. This will not be done again, and patient must take medications as prescribed. Will attempt to schedule patient for an earlier visit to discuss anxiety.   PGY-4 Eliseo Gum, MD

## 2023-10-08 ENCOUNTER — Other Ambulatory Visit (HOSPITAL_COMMUNITY): Payer: Self-pay | Admitting: Student in an Organized Health Care Education/Training Program

## 2023-10-08 DIAGNOSIS — F332 Major depressive disorder, recurrent severe without psychotic features: Secondary | ICD-10-CM

## 2023-10-08 MED ORDER — MIRTAZAPINE 45 MG PO TBDP
45.0000 mg | ORAL_TABLET | Freq: Every day | ORAL | 0 refills | Status: DC
Start: 2023-10-08 — End: 2023-10-22

## 2023-10-08 NOTE — Telephone Encounter (Signed)
Ok I canceled the order, and sent a new one to [facility]. Thanks.

## 2023-10-08 NOTE — Progress Notes (Signed)
Provider called [facility] to cancel rx, and sent 11 day supply to [facility] as requested by patient since [facility] was out of the medication.  PGY-4 Eliseo Gum, MD

## 2023-10-12 ENCOUNTER — Telehealth (HOSPITAL_COMMUNITY): Payer: Self-pay | Admitting: *Deleted

## 2023-10-12 NOTE — Telephone Encounter (Signed)
RX REFILL REQUEST 90 DAY SUPPLY  [id number] [place], [place] - [address]     mirtazapine (REMERON SOLTAB) 45 MG disintegrating tablet                       && traZODone (DESYREL) 50 MG tablet

## 2023-10-15 NOTE — Telephone Encounter (Signed)
Denied this patient should not have a 90 day supply.

## 2023-10-22 ENCOUNTER — Ambulatory Visit (INDEPENDENT_AMBULATORY_CARE_PROVIDER_SITE_OTHER): Payer: Medicare PPO | Admitting: Student in an Organized Health Care Education/Training Program

## 2023-10-22 ENCOUNTER — Encounter (HOSPITAL_COMMUNITY): Payer: Self-pay | Admitting: Student in an Organized Health Care Education/Training Program

## 2023-10-22 VITALS — BP 159/83 | HR 81 | Wt 156.0 lb

## 2023-10-22 DIAGNOSIS — F1021 Alcohol dependence, in remission: Secondary | ICD-10-CM | POA: Diagnosis not present

## 2023-10-22 DIAGNOSIS — F332 Major depressive disorder, recurrent severe without psychotic features: Secondary | ICD-10-CM | POA: Diagnosis not present

## 2023-10-22 DIAGNOSIS — F411 Generalized anxiety disorder: Secondary | ICD-10-CM | POA: Diagnosis not present

## 2023-10-22 MED ORDER — TRAZODONE HCL 50 MG PO TABS
50.0000 mg | ORAL_TABLET | Freq: Every day | ORAL | 2 refills | Status: DC
Start: 2023-10-22 — End: 2023-12-17

## 2023-10-22 MED ORDER — SERTRALINE HCL 100 MG PO TABS
200.0000 mg | ORAL_TABLET | Freq: Every day | ORAL | 2 refills | Status: DC
Start: 2023-10-22 — End: 2023-12-17

## 2023-10-22 MED ORDER — MIRTAZAPINE 45 MG PO TBDP
45.0000 mg | ORAL_TABLET | Freq: Every day | ORAL | 2 refills | Status: DC
Start: 2023-10-22 — End: 2023-12-17

## 2023-10-22 MED ORDER — BUSPIRONE HCL 15 MG PO TABS
15.0000 mg | ORAL_TABLET | Freq: Three times a day (TID) | ORAL | 2 refills | Status: DC
Start: 2023-10-22 — End: 2023-12-17

## 2023-10-22 MED ORDER — TRAZODONE HCL 100 MG PO TABS
100.0000 mg | ORAL_TABLET | Freq: Every evening | ORAL | 2 refills | Status: DC | PRN
Start: 2023-10-22 — End: 2023-12-17

## 2023-10-22 NOTE — Progress Notes (Signed)
BH MD/PA/NP OP Progress Note  10/22/2023 2:51 PM Stephanie Riley  MRN:  [id number]  Chief Complaint:  No chief complaint on file.  HPI: Ardita Surgeon is a 72 yo patient with a PPH of MDD, Etoh use disorder, self-reported ADHD, anxiety, and chronic prescribed BZD use (tapered off in 2024). Sober from [place] since hospitalization 09/04/2023 and BZDs since dc 09/09/2023.  Current regimen:    - Remeron 45mg  QHS - Buspar 15mg  TID - Naltrexone 50mg  daily, rx by PCP - Zoloft 200mg  daily - Trazodne 150mg  at bedtime  Patient reports that she feels her medications are working appropriately and she is no longer taking more than prescribed.  Patient reports that she has not wanted to drink since being dc'd from the [facility] hospital. Patient still in the process of divorcing husband and this continues to be a stressor.  Patient reports that she is eating more, she has noticed her appetite has increased. Patient reports that her sleep is improving, most night she is sleeping better. Patient reports that she is struggling with adjusting to the divorce proceedings. Patient reports that she is back in the gym and has goals set in place. Patient reports that she is also in therapist and this is going really well. Patient reports that she will go back to Bible study after the holidays. Patient reports that her religion is also helpful. Patient denies SI, HI, and AVH. Patient reports that things are going well with her daughter and she will see her over the Thanksgiving holiday.  She will be going to her daughter for Christmas. Patient will be seeing her therapist, she is trying to do every 1-2 weeks. Patient reports that her anxiety has significantly improved. She reports decrease in her somatic symptoms (of gasping response) she does notice that the divorce proceedings can be a trigger.   Patient does have some support in her sister. Patient is now driving and comfortable driving and going out to her activities, which she  is so happy about. Patient has multiple gym member ships because she goes off of the schedules, she is doing Social worker program so she is not having to pay. She is making new friends. Patient denies cravings for Etoh and denies Etoh in the home. Patient also feels like she has has lost her taste for them, so she will consider throwing it out.   Visit Diagnosis:    ICD-10-CM   1. Alcohol use disorder, severe, in early remission (HCC)  F10.21     2. Severe episode of recurrent major depressive disorder, without psychotic features (HCC)  F33.2 busPIRone (BUSPAR) 15 MG tablet    mirtazapine (REMERON SOLTAB) 45 MG disintegrating tablet    sertraline (ZOLOFT) 100 MG tablet    traZODone (DESYREL) 50 MG tablet    3. GAD (generalized anxiety disorder)  F41.1 sertraline (ZOLOFT) 100 MG tablet    traZODone (DESYREL) 100 MG tablet       Past Psychiatric History: OPT: Hx of Adderall, Zoloft, Trazodone, Wellbutrin, Xanax, Klonopin, no psychiatry opt 12/2022- INPT at [facility] , patient noted to have yellow/orange sclera on arrival, but cleared by discharge. Dx with Etoh use d/o,. MOCA was done due to cognitive concerns, scored 23/30. 2nd hosp in 2024- [facility], BZD withdrawal Dx: MDD, Etoh use disorder, ADHD, anxiety, and chronic prescribed BZD use. Therapy: marriage counseling 03/2023- Sent to [facility], for Etoh detox. Patient was already approx 5 days sober from BZDs but still heavily drinking. In [facility] patient had hyperbilirubinemia,  hypokalemia and UTI and was treated for this. She was also placed on Librium taper, patient tolerated well  03/2023- 09/2023. Patient did not f/u with [facility]. Patient presented to [facility] and medical ED once prior to admission in 08/2023 related to etoh use d/o. Patient did go to Residential rehab over the summer at [facility] in [place] and did complete the program after this 05/2023 appearance to the ED. Patient was dc'd from [facility] 09/09/2023 for Etoh use d/o, Alcoholic ketoacidosis,  elevated CK, aspiration, elevated LFTs, SIRS, hypocalcemia and hypophosphatemia, HTN and has had a f/u with her PCP and has fungal dermatitis. She was dc'd with instructions to continue remeron 7.5mg  at bedtime, zoloft 100mg  daily, naltrexone 50mg  daily, buspar 15mg  tid, trazodone 100mg  at bedtime. 09/2023- Conitnues to endorse severe anxiety, increased remeron on her own to 22mg , at appt increased to 45mg  due to continued anxiety. Conitued Buspar at 15mg  TID and Zoloft 200mg  and trazodone 150mg  at bedtime. Remains sober  09/2023-Telephone: patient endorse severe anxiety and had been taking more than prescribed Remeron, patient was given 11 day supply to get her to next appt but made it clear to patient that she must take meds as prescribed and will not get an another early rx from provider after this  Past Medical History:  Past Medical History:  Diagnosis Date   Alcohol abuse    Benzodiazepine withdrawal without complication (HCC) 02/12/2023   HTN (hypertension)    No past surgical history on file.  Family Psychiatric History: Both parents had Alzhmier's   Family History: No family history on file.  Social History:  Social History   Socioeconomic History   Marital status: Married    Spouse name: Not on file   Number of children: Not on file   Years of education: Not on file   Highest education level: Not on file  Occupational History   Not on file  Tobacco Use   Smoking status: Never   Smokeless tobacco: Never  Substance and Sexual Activity   Alcohol use: Yes   Drug use: Never   Sexual activity: Not Currently  Other Topics Concern   Not on file  Social History Narrative   Not on file   Social Determinants of Health   Financial Resource Strain: Low Risk  (10/19/2022)   Received from [facility], [facility]   Overall Financial Resource Strain (CARDIA)    Difficulty of Paying Living Expenses: Not hard at all  Food Insecurity: No Food Insecurity (09/04/2023)   Hunger  Vital Sign    Worried About Running Out of Food in the Last Year: Never true    Ran Out of Food in the Last Year: Never true  Transportation Needs: No Transportation Needs (09/04/2023)   PRAPARE - Administrator, Civil Service (Medical): No    Lack of Transportation (Non-Medical): No  Physical Activity: Insufficiently Active (10/19/2022)   Received from [facility], [facility]   Exercise Vital Sign    Days of Exercise per Week: 3 days    Minutes of Exercise per Session: 20 min  Stress: No Stress Concern Present (10/19/2022)   Received from [facility], [facility] of Occupational Health - Occupational Stress Questionnaire    Feeling of Stress : Not at all  Social Connections: Socially Integrated (10/19/2022)   Received from [facility], [facility]   Social Network    How would you rate your social network (family, work, friends)?: Good participation with social  networks    Allergies: No Known Allergies  Metabolic Disorder Labs: Lab Results  Component Value Date   HGBA1C 4.9 05/25/2023   MPG 93.93 05/25/2023   No results found for: "PROLACTIN" Lab Results  Component Value Date   CHOL 145 05/25/2023   TRIG 65 05/25/2023   HDL 91 05/25/2023   CHOLHDL 1.6 05/25/2023   VLDL 13 05/25/2023   LDLCALC 41 05/25/2023   Lab Results  Component Value Date   TSH 1.830 05/25/2023   TSH 3.115 02/11/2023    Therapeutic Level Labs: No results found for: "LITHIUM" No results found for: "VALPROATE" No results found for: "CBMZ"  Current Medications: Current Outpatient Medications  Medication Sig Dispense Refill   amLODipine (NORVASC) 10 MG tablet Take 1 tablet (10 mg total) by mouth daily. 30 tablet 0   naltrexone (DEPADE) 50 MG tablet Take 1 tablet by mouth daily.     thiamine (VITAMIN B-1) 100 MG tablet Take 1 tablet (100 mg total) by mouth daily. 90 tablet 0   busPIRone (BUSPAR) 15 MG tablet Take 1 tablet (15 mg total) by mouth 3  (three) times daily. Although insurance would like 90 day supply rx, to encourage compliance and appropriate use 30 day supplies are what provider will be writing. 90 tablet 2   dextromethorphan-guaiFENesin (MUCINEX DM) 30-600 MG 12hr tablet Take 1 tablet by mouth 2 (two) times daily. 30 tablet 0   folic acid (FOLVITE) 1 MG tablet Take 1 tablet (1 mg total) by mouth daily. (Patient not taking: Reported on 10/22/2023) 30 tablet 0   mirtazapine (REMERON SOLTAB) 45 MG disintegrating tablet Take 1 tablet (45 mg total) by mouth at bedtime. 30 tablet 2   sertraline (ZOLOFT) 100 MG tablet Take 2 tablets (200 mg total) by mouth daily. 60 tablet 2   traZODone (DESYREL) 100 MG tablet Take 1 tablet (100 mg total) by mouth at bedtime as needed for sleep. 30 tablet 2   traZODone (DESYREL) 50 MG tablet Take 1 tablet (50 mg total) by mouth at bedtime. Can take 100-150mg  30 tablet 2   No current facility-administered medications for this visit.     Musculoskeletal: Strength & Muscle Tone: decreased Gait & Station: ataxic Patient leans: Front  Psychiatric Specialty Exam: Review of Systems  Psychiatric/Behavioral:  Negative for dysphoric mood, hallucinations, sleep disturbance and suicidal ideas. The patient is not nervous/anxious.     Blood pressure (!) 159/83, pulse 81, weight 156 lb (70.8 kg), SpO2 99%.Body mass index is 25.18 kg/m.  General Appearance: Disheveled  Eye Contact:  Good  Speech:  Clear and Coherent  Volume:  Normal  Mood:  Euthymic  Affect:  Appropriate  Thought Process:  Coherent  Orientation:  Full (Time, Place, and Person)  Thought Content: Logical   Suicidal Thoughts:  No  Homicidal Thoughts:  No  Memory:  Immediate;   Good Recent;   Good  Judgement:  Fair  Insight:  Fair  Psychomotor Activity:  Normal  Concentration:  Concentration: Good  Recall:  Fair  Fund of Knowledge: Good  Language: Good  Akathisia:  No  Handed:    AIMS (if indicated): not done  Assets:   Communication Skills Desire for Improvement Financial Resources/Insurance Housing Leisure Time Resilience Social Support  ADL's:  Intact  Cognition: WNL  Sleep:  Good   Screenings: AUDIT    Flowsheet Row ED from 05/25/2023 in [facility] Emergency Department at [facility]  Alcohol Use Disorder Identification Test Final Score (AUDIT) 27  PHQ2-9    Flowsheet Row ED from 05/25/2023 in [facility] ED from 03/19/2023 in [facility] ED from 02/11/2023 in [facility]  PHQ-2 Total Score 6 3 1   PHQ-9 Total Score 9 3 4       Flowsheet Row ED to Hosp-Admission (Discharged) from 09/03/2023 in [facility] Most recent reading at 09/04/2023  5:00 AM ED from 05/25/2023 in [facility] Most recent reading at 05/25/2023  5:34 PM ED from 05/25/2023 in [facility] Most recent reading at 05/25/2023  4:27 PM  C-SSRS RISK CATEGORY No Risk No Risk No Risk        Assessment and Plan: Patient anxiety has improved, patient remains sober and is now active going to therapy, communicating with her healthcare providers, taking medication as prescribed, and going to the gym as she had previously endorsed was her goal. Patient is aware that her divorce is a trigger for her anxiety but she is trying to use coping skills by reaching out to family and friends and keeping herself active. Patient appetite and weight have picked up. Will continue medication regimen.    Although insurance would like 90 day supply rx, to encourage compliance and appropriate use 30 day supplies are what provider will be writing.   EtOH use disorder, severe in early remission MDD, recurrent, moderate GAD - Continue Zoloft 200 mg daily, anxiety and depression - Continue trazodone 150 mg nightly, insomnia - Continue Remeron to 45 mg nightly -  Continue BuSpar 15 mg TID, anxiety  F/u in 4-6 weeks  Collaboration of Care: Collaboration of Care: Attending provider, Dr. Clovis Riley present for part of assessment  Patient/Guardian was advised Release of Information must be obtained prior to any record release in order to collaborate their care with an outside provider. Patient/Guardian was advised if they have not already done so to contact the registration department to sign all necessary forms in order for [place] to release information regarding their care.   Consent: Patient/Guardian gives verbal consent for treatment and assignment of benefits for services provided during this visit. Patient/Guardian expressed understanding and agreed to proceed.   PGY-4 [name], MD 10/22/2023, 2:51 PM

## 2023-10-29 ENCOUNTER — Encounter (HOSPITAL_COMMUNITY): Payer: Medicare PPO | Admitting: Student in an Organized Health Care Education/Training Program

## 2023-11-29 ENCOUNTER — Telehealth (HOSPITAL_COMMUNITY): Payer: Self-pay | Admitting: *Deleted

## 2023-11-29 NOTE — Telephone Encounter (Signed)
[id number] - [place], [place] - [address]    90 Day Supply Request Sertraline (ZOLOFT) 100 MG tablet

## 2023-11-30 ENCOUNTER — Telehealth (HOSPITAL_COMMUNITY): Payer: Self-pay | Admitting: *Deleted

## 2023-11-30 NOTE — Telephone Encounter (Signed)
Will not refill, patient will f/u on 1/3. Have been restrictive to endorse good compliance. May consider 90 day then.

## 2023-11-30 NOTE — Telephone Encounter (Signed)
Will not refill now, patient will f/u on 1/3 when patient is seen. Have been restrictive to endorse good compliance. May consider 90 day then.

## 2023-11-30 NOTE — Telephone Encounter (Signed)
Fax received form [facility] for 90 day request for Remeron. Message sent to MD for review.

## 2023-12-03 ENCOUNTER — Encounter (HOSPITAL_COMMUNITY): Payer: Medicare PPO | Admitting: Student in an Organized Health Care Education/Training Program

## 2023-12-06 ENCOUNTER — Telehealth (HOSPITAL_COMMUNITY): Payer: Self-pay

## 2023-12-06 NOTE — Telephone Encounter (Signed)
Request sent 

## 2023-12-10 ENCOUNTER — Telehealth (HOSPITAL_COMMUNITY): Payer: Self-pay | Admitting: Student in an Organized Health Care Education/Training Program

## 2023-12-10 ENCOUNTER — Encounter (HOSPITAL_COMMUNITY): Payer: Medicare PPO | Admitting: Student in an Organized Health Care Education/Training Program

## 2023-12-10 NOTE — Telephone Encounter (Signed)
 Called patient back. Patient endorsed she was not having an emergency. Patient reports that she has been wondering if she had dissociation and she has been talking to her therapist as well about her hx of trauma. Patient reports that she thinks she is going through benzo withdrawal because of her dissociation feelings.   Discussed with patient that dissociation is on the trauma spectrum. She also wants to focus a bit less on Etoh. Did remind patient that her brain is still adjusting to not being on Bzds or needing Etoh. Patient agrees.   Patient does continue to feel her medications work well.   PGY-4 Reggie Rice, MD

## 2023-12-14 ENCOUNTER — Telehealth (HOSPITAL_COMMUNITY): Payer: Self-pay | Admitting: *Deleted

## 2023-12-14 ENCOUNTER — Telehealth (HOSPITAL_COMMUNITY): Payer: Self-pay | Admitting: Student in an Organized Health Care Education/Training Program

## 2023-12-14 NOTE — Telephone Encounter (Signed)
 Pharmacy faxed request for 90 day prescription of Trazodone 50mg .

## 2023-12-17 ENCOUNTER — Ambulatory Visit (HOSPITAL_COMMUNITY): Payer: Medicare PPO | Admitting: Student in an Organized Health Care Education/Training Program

## 2023-12-17 VITALS — BP 150/88 | HR 85 | Wt 159.0 lb

## 2023-12-17 DIAGNOSIS — F332 Major depressive disorder, recurrent severe without psychotic features: Secondary | ICD-10-CM

## 2023-12-17 DIAGNOSIS — F411 Generalized anxiety disorder: Secondary | ICD-10-CM

## 2023-12-17 DIAGNOSIS — F1020 Alcohol dependence, uncomplicated: Secondary | ICD-10-CM

## 2023-12-17 MED ORDER — TRAZODONE HCL 50 MG PO TABS
50.0000 mg | ORAL_TABLET | Freq: Every day | ORAL | 0 refills | Status: DC
Start: 2023-12-17 — End: 2024-02-23

## 2023-12-17 MED ORDER — HYDROXYZINE PAMOATE 25 MG PO CAPS
25.0000 mg | ORAL_CAPSULE | Freq: Three times a day (TID) | ORAL | 0 refills | Status: DC | PRN
Start: 2023-12-17 — End: 2024-01-14

## 2023-12-17 MED ORDER — BUSPIRONE HCL 15 MG PO TABS
15.0000 mg | ORAL_TABLET | Freq: Three times a day (TID) | ORAL | 0 refills | Status: DC
Start: 2023-12-17 — End: 2024-02-23

## 2023-12-17 MED ORDER — TRAZODONE HCL 100 MG PO TABS
100.0000 mg | ORAL_TABLET | Freq: Every evening | ORAL | 0 refills | Status: DC | PRN
Start: 2023-12-17 — End: 2024-02-23

## 2023-12-17 MED ORDER — SERTRALINE HCL 100 MG PO TABS
200.0000 mg | ORAL_TABLET | Freq: Every day | ORAL | 0 refills | Status: DC
Start: 2023-12-17 — End: 2024-02-23

## 2023-12-17 MED ORDER — MIRTAZAPINE 45 MG PO TBDP
45.0000 mg | ORAL_TABLET | Freq: Every day | ORAL | 0 refills | Status: DC
Start: 2023-12-17 — End: 2024-02-23

## 2023-12-17 NOTE — Progress Notes (Signed)
 BH MD/PA/NP OP Progress Note  12/17/2023 1:58 PM Stephanie Riley  MRN:  [id number]  Chief Complaint:  Chief Complaint  Patient presents with   Follow-up   HPI: Stephanie Riley is a 73 yo patient with a PPH of MDD, Etoh use disorder, self-reported ADHD, anxiety, and chronic prescribed BZD use (tapered off in 2024). Sober from [place] since hospitalization 09/04/2023 and BZDs since dc 09/09/2023.  Current regimen:    - Remeron  45mg  QHS - Buspar  15mg  TID - Naltrexone  50mg  daily, rx by PCP - Zoloft  200mg  daily - Trazodone  150mg  at bedtime  Patient came with her thoughts typed out about how things have been going. Patient wrote that she has relapsed on Etoh and relapsed on Christmas Eve. Patient wrote about her struggle with letting Etoh completely go. Patient wrote about she uses it to self-medicate fro anxiety and she has noticed some triggers in her husband when he would yell at her. Patient also wrote about how she lost herself prior to the intervention and then felt completely lost and depressed after the intervention. Patient wrote that she now may drink a bit of Etoh when she is very anxious, but is not drinking daily. Patient wrote that she is working to get back to herself, and that she was lost. Patient also wrote that she has started AA 2x/ week. Patient wrote that she is not totally sold on the 12 steps but she feels support and she has someone who is willing to be her sponsor.   Patient reports that she is still very anxious. Patient wrote in her journal she is confused and overwhelmed with her future and where she will move and do next. Patient is trying to accept going through a divorce at 73 yo.  Patient denies SI, HI, and AVH. Patient reports appetite is good. Patient reports that her last drink was 2 days ago before AA. Patient reports that her sleep has declined some. Patient reports that she has been having some night time awakenings. Patient reports that she is sleeping averaging 8hrs  and has good energy. Patient reports that she is still going to the gym a few times a week. Patient reports that she is walking. Patient reports that she has a routine.   Patient reports that she has been having very brief dissociating moments daily. Patient does not endorse paranoia of people around her. Patient reports that these dissociative events can be scary.   Visit Diagnosis:    ICD-10-CM   1. GAD (generalized anxiety disorder)  F41.1     2. Alcohol use disorder, moderate, dependence (HCC)  F10.20     3. Severe episode of recurrent major depressive disorder, without psychotic features (HCC)  F33.2         Past Psychiatric History: OPT: Hx of Adderall, Zoloft , Trazodone , Wellbutrin, Xanax, Klonopin , no psychiatry opt 12/2022- INPT at [facility] , patient noted to have yellow/orange sclera on arrival, but cleared by discharge. Dx with Etoh use d/o,. MOCA was done due to cognitive concerns, scored 23/30. 2nd hosp in 2024- [facility], BZD withdrawal Dx: MDD, Etoh use disorder, ADHD, anxiety, and chronic prescribed BZD use. Therapy: marriage counseling 03/2023- Sent to [facility], for Etoh detox. Patient was already approx 5 days sober from BZDs but still heavily drinking. In [facility] patient had hyperbilirubinemia,  hypokalemia and UTI and was treated for this. She was also placed on Librium  taper, patient tolerated well  03/2023- 09/2023. Patient did not f/u with [facility]. Patient presented to [facility]  and medical ED once prior to admission in 08/2023 related to etoh use d/o. Patient did go to Residential rehab over the summer at [facility] in [place] and did complete the program after this 05/2023 appearance to the ED. Patient was dc'd from [facility] 09/09/2023 for Etoh use d/o, Alcoholic ketoacidosis, elevated CK, aspiration, elevated LFTs, SIRS, hypocalcemia and hypophosphatemia, HTN and has had a f/u with her PCP and has fungal dermatitis. She was dc'd with instructions to continue remeron  7.5mg  at bedtime, zoloft   100mg  daily, naltrexone  50mg  daily, buspar  15mg  tid, trazodone  100mg  at bedtime. 09/2023- Conitnues to endorse severe anxiety, increased remeron  on her own to 22mg , at appt increased to 45mg  due to continued anxiety. Conitued Buspar  at 15mg  TID and Zoloft  200mg  and trazodone  150mg  at bedtime. Remains sober  09/2023-Telephone: patient endorse severe anxiety and had been taking more than prescribed Remeron , patient was given 11 day supply to get her to next appt but made it clear to patient that she must take meds as prescribed and will not get an another early rx from provider after this 10/2023-Patient anxiety has improved, patient remains sober and is now active going to therapy, communicating with her healthcare providers, taking medication as prescribed, and going to the gym as she had previously endorsed was her goal.  Past Medical History:  Past Medical History:  Diagnosis Date   Alcohol abuse    Benzodiazepine withdrawal without complication (HCC) 02/12/2023   HTN (hypertension)    No past surgical history on file.  Family Psychiatric History: Both parents had Alzhmier's   Family History: No family history on file.  Social History:  Social History   Socioeconomic History   Marital status: Married    Spouse name: Not on file   Number of children: Not on file   Years of education: Not on file   Highest education level: Not on file  Occupational History   Not on file  Tobacco Use   Smoking status: Never   Smokeless tobacco: Never  Substance and Sexual Activity   Alcohol use: Yes   Drug use: Never   Sexual activity: Not Currently  Other Topics Concern   Not on file  Social History Narrative   Not on file   Social Drivers of Health   Financial Resource Strain: Low Risk  (10/19/2022)   Received from [facility]   Overall Financial Resource Strain (CARDIA)    Difficulty of Paying Living Expenses: Not hard at all  Food Insecurity: No Food Insecurity  (09/04/2023)   Hunger Vital Sign    Worried About Running Out of Food in the Last Year: Never true    Ran Out of Food in the Last Year: Never true  Transportation Needs: No Transportation Needs (09/04/2023)   PRAPARE - Administrator, Civil Service (Medical): No    Lack of Transportation (Non-Medical): No  Physical Activity: Insufficiently Active (10/19/2022)   Received from [facility]   Exercise Vital Sign    Days of Exercise per Week: 3 days    Minutes of Exercise per Session: 20 min  Stress: No Stress Concern Present (10/19/2022)   Received from [facility] Occupational [facility] - Occupational Stress Questionnaire    Feeling of Stress : Not at all  Social Connections: Socially Integrated (10/19/2022)   Received from [facility]   Social Network    How would you rate your social network (family, work,  friends)?: Good participation with social networks    Allergies: No Known Allergies  Metabolic Disorder Labs: Lab Results  Component Value Date   HGBA1C 4.9 05/25/2023   MPG 93.93 05/25/2023   No results found for: PROLACTIN Lab Results  Component Value Date   CHOL 145 05/25/2023   TRIG 65 05/25/2023   HDL 91 05/25/2023   CHOLHDL 1.6 05/25/2023   VLDL 13 05/25/2023   LDLCALC 41 05/25/2023   Lab Results  Component Value Date   TSH 1.830 05/25/2023   TSH 3.115 02/11/2023    Therapeutic Level Labs: No results found for: LITHIUM No results found for: VALPROATE No results found for: CBMZ  Current Medications: Current Outpatient Medications  Medication Sig Dispense Refill   amLODipine  (NORVASC ) 10 MG tablet Take 1 tablet (10 mg total) by mouth daily. 30 tablet 0   busPIRone  (BUSPAR ) 15 MG tablet Take 1 tablet (15 mg total) by mouth 3 (three) times daily. Although insurance would like 90 day supply rx, to encourage compliance and appropriate use 30 day supplies are what provider  will be writing. 90 tablet 2   dextromethorphan -guaiFENesin  (MUCINEX  DM) 30-600 MG 12hr tablet Take 1 tablet by mouth 2 (two) times daily. 30 tablet 0   mirtazapine  (REMERON  SOLTAB) 45 MG disintegrating tablet Take 1 tablet (45 mg total) by mouth at bedtime. 30 tablet 2   naltrexone  (DEPADE) 50 MG tablet Take 1 tablet by mouth daily.     sertraline  (ZOLOFT ) 100 MG tablet Take 2 tablets (200 mg total) by mouth daily. 60 tablet 2   traZODone  (DESYREL ) 100 MG tablet Take 1 tablet (100 mg total) by mouth at bedtime as needed for sleep. 30 tablet 2   traZODone  (DESYREL ) 50 MG tablet Take 1 tablet (50 mg total) by mouth at bedtime. Can take 100-150mg  30 tablet 2   No current facility-administered medications for this visit.     Musculoskeletal: Strength & Muscle Tone: decreased Gait & Station: ataxic Patient leans: Front  Psychiatric Specialty Exam: Review of Systems  Psychiatric/Behavioral:  Negative for dysphoric mood, hallucinations, sleep disturbance and suicidal ideas. The patient is not nervous/anxious.     Blood pressure (!) 150/88, pulse 85, SpO2 98%.There is no height or weight on file to calculate BMI.  General Appearance: Fairly Groomed  Eye Contact:  Good  Speech:  Clear and Coherent  Volume:  Normal  Mood:  Euthymic  Affect:  Appropriate  Thought Process:  Coherent  Orientation:  Full (Time, Place, and Person)  Thought Content: Logical   Suicidal Thoughts:  No  Homicidal Thoughts:  No  Memory:  Immediate;   Good Recent;   Good  Judgement:  Fair  Insight:  Fair  Psychomotor Activity:  Normal  Concentration:  Concentration: Good  Recall:  Fair  Fund of Knowledge: Good  Language: Good  Akathisia:  No  Handed:    AIMS (if indicated): not done  Assets:  Communication Skills Desire for Improvement Financial Resources/Insurance Housing Leisure Time Resilience Social Support  ADL's:  Intact  Cognition: WNL  Sleep:  Good   Screenings: AUDIT    Flowsheet Row ED  from 05/25/2023 in [facility] Department [facility]  Alcohol Use Disorder Identification Test Final Score (AUDIT) 27      PHQ2-9    Flowsheet Row ED from 05/25/2023 in [facility] ED from 03/19/2023 in [facility] ED from 02/11/2023 in [facility]  PHQ-2 Total Score  6 3 1   PHQ-9 Total Score 9 3 4       Flowsheet Row ED to Hosp-Admission (Discharged) from 09/03/2023 in [place] LONG 4TH FLOOR PROGRESSIVE CARE AND UROLOGY Most recent reading at 09/04/2023  5:00 AM ED from 05/25/2023 in [facility] Most recent reading at 05/25/2023  5:34 PM ED from 05/25/2023 in [facility] Most recent reading at 05/25/2023  4:27 PM  C-SSRS RISK CATEGORY No Risk No Risk No Risk        Assessment and Plan: Patient endorses relapsing with EtOH.  But she continues to attend AA and is finding support and continues to journal and reflect on her thoughts and identifies her drinking as a problem.  Patient also identifies that she is triggered by anxiety and has started to identify what some of her triggers are.  Patient would like to start hydroxyzine  as needed to help ease anxiety without drinking EtOH.  Patient does feel that the medications are helping keep the majority of her anxiety at bay however she is having some breakthrough anxiety.  We will continue to monitor dissociation.  Have recommended the patient see her PCP given elevated blood pressures.  Would recommend following up medically as this may be contributing to patient's altered feeling on occasion.  We will also place referral for therapist as patient continues to be interested in therapy but feels that she may need a new therapist.  Patient continues to work on acceptance of her husband asking for divorce.  Patient also appears to be accepting that her drinking is a problem, and identifies that her  occasional sips for anxiety are not overall beneficial for her health but are more responsive to her immediate want to get rid of her anxious feeling.  Discussed with patient her insurance request for 90-day supply of medications.  Patient reports she feels that she can be compliant with her medications while having 90-day supply of pills.  EtOH use disorder, moderate MDD, recurrent, moderate GAD - Will start Hydorxyzine 25mg  TID PRN - Continue Zoloft  200 mg daily, anxiety and depression - Continue trazodone  150 mg nightly, insomnia - Continue Remeron  to 45 mg nightly - Continue BuSpar  15 mg TID, anxiety -- REFERRAL to THERAPY  Will do 90 day supply F/u in 3 weeks  Collaboration of Care: Collaboration of Care:   Patient/Guardian was advised Release of Information must be obtained prior to any record release in order to collaborate their care with an outside provider. Patient/Guardian was advised if they have not already done so to contact the registration department to sign all necessary forms in order for us  to release information regarding their care.   Consent: Patient/Guardian gives verbal consent for treatment and assignment of benefits for services provided during this visit. Patient/Guardian expressed understanding and agreed to proceed.   PGY-4 Reggie KATHEE Rice, MD 12/17/2023, 1:58 PM

## 2024-01-05 ENCOUNTER — Telehealth (HOSPITAL_COMMUNITY): Payer: Self-pay | Admitting: Student in an Organized Health Care Education/Training Program

## 2024-01-07 ENCOUNTER — Encounter (HOSPITAL_COMMUNITY): Payer: Medicare PPO | Admitting: Student in an Organized Health Care Education/Training Program

## 2024-01-07 NOTE — Telephone Encounter (Signed)
Patient received 90 day supply of her meds in 1/3, she should not need refills. I tried to call to confirm.

## 2024-01-11 ENCOUNTER — Telehealth (HOSPITAL_COMMUNITY): Payer: Self-pay

## 2024-01-11 NOTE — Telephone Encounter (Signed)
Medication refill - Fax from pt's [facility] in [place] requesting a new Hydroxyzine 25 mg order, last provided and filled for 30 day supply on 12/17/23 and pt does not return until 01/21/24.

## 2024-01-13 ENCOUNTER — Telehealth (HOSPITAL_COMMUNITY): Payer: Self-pay | Admitting: Student in an Organized Health Care Education/Training Program

## 2024-01-14 ENCOUNTER — Telehealth (HOSPITAL_COMMUNITY): Payer: Self-pay | Admitting: Student in an Organized Health Care Education/Training Program

## 2024-01-14 ENCOUNTER — Other Ambulatory Visit (HOSPITAL_COMMUNITY): Payer: Self-pay | Admitting: Student in an Organized Health Care Education/Training Program

## 2024-01-14 DIAGNOSIS — F411 Generalized anxiety disorder: Secondary | ICD-10-CM

## 2024-01-14 MED ORDER — HYDROXYZINE PAMOATE 25 MG PO CAPS: 25.0000 mg | ORAL_CAPSULE | Freq: Three times a day (TID) | ORAL | 0 refills | Status: DC | PRN

## 2024-01-14 NOTE — Telephone Encounter (Signed)
Ok, sent. Thanks!

## 2024-01-14 NOTE — Telephone Encounter (Signed)
 Was able to have conversation with patient, about message that she was afraid that she was going to relapse with the divorce being closer to finalization.   Patient reports that she ended up going to AA last night, but did drink some Etoh at some point. Patient does not have wine in her house.   Patient reports that she is feeling better today and in less distress. Patient reports that she intends to f/u with her new therapist on 2/13 and with provider on 2/14. Patient reports that she feels more encouraged and looks forward to having a new therapist.   Patient did endorse that she feels like the Hydroxyzine  helps her sleep if she wakes up in the middle of the night, will change Hydroxyzine  25mg  to QID dosing.    PGY-4 Reggie Rice, MD

## 2024-01-14 NOTE — Telephone Encounter (Signed)
 I tried to reach out to patient, unfortunately she did not respond. The second number on her chart is her ex-husband. Will try again later.

## 2024-01-20 ENCOUNTER — Ambulatory Visit (INDEPENDENT_AMBULATORY_CARE_PROVIDER_SITE_OTHER): Payer: Medicare PPO

## 2024-01-20 DIAGNOSIS — F1020 Alcohol dependence, uncomplicated: Secondary | ICD-10-CM

## 2024-01-20 DIAGNOSIS — F1994 Other psychoactive substance use, unspecified with psychoactive substance-induced mood disorder: Secondary | ICD-10-CM

## 2024-01-20 DIAGNOSIS — F411 Generalized anxiety disorder: Secondary | ICD-10-CM

## 2024-01-20 NOTE — Progress Notes (Unsigned)
THERAPIST PROGRESS NOTE   Session Time: 9:58 am to 10:52 am NO CHARGE Virtual Visit via Video Note   I connected with  Stephanie Riley at  9:58 am EST by a video enabled telemedicine application and verified that I am speaking with the correct person using two identifiers.   Location: Patient: home Provider: [address] [place]   I discussed the limitations of evaluation and management by telemedicine and the availability of in person appointments. The patient expressed understanding and agreed to proceed.  Type of Therapy: Individual   Intervention:  Therapist discusses the  dangers of continuing to drink that amount of Alcohol including alcohol poisoning, DT's, seizures, strokes and the development of other medical disorders or death. Therapist urges Stephanie Riley to get someone to drive her for detox at [facility].  She says she does not know if she wants to come here because the last time she came a staff refused to give her imodium for her diarrhea.  Therapist discusses other possible detox facility options.  Stephanie Riley agrees to get someone to bring her, however when therapist asks her to commit to a time window, she says she is going to an AA meeting tonight.  Therapist advised it would not be smart to attend an AA meeting intoxicated.  Stephanie Riley then says she has an appointment with Dr. Morrie Sheldon tomorrow and she wants to talk to her about detox.  Therapist again emphasized the importance of not waiting and to seek detox ASAP.  Stephanie Riley would not agree to do this and again said she will discuss it with Dr. Morrie Sheldon tomorrow. Therapist advises her to have someone drive her with her continued alcohol consumption.  Therapist sends an in basket message to Dr. Morrie Sheldon apprising her of Stephanie Riley's present condition and her plan.  Summary:  Stephanie Riley calls and asks for her appointment to be changed to virtual as she said she is so shaking from anxiety that she cannot drive. Stephanie Riley is lying down when therapist joins the virtual  session. Therapists explains she will be doing a CCA and discusses what this entails.  Therapist explains that during the next sessions we can focus in more detail on issues.  Stephanie Riley discusses that she and her husband were separate for a year and so she thought he would have to tell her when he wanted a divorce.  Stephanie Riley says that he recently called her and told her the divorce was final.  Stephanie Riley says her former husband is an attorney and he is telling her she has to move out of the house and she does not know where she will go at 73 years of age.  Stephanie Riley says she drinks a little to calm her anxiety.  Therapist inquires further about her use of alcohol.  Stephanie Riley was unable to provide many details about her history of using. When therapist asked about current use, she said she went from Sept to Christmas Eve and was abstinent but then started drinking a little and the volume increased.  Stephanie Riley reports she has been drinking 3 oz of liquor every hour for the past "few" days. Stephanie Riley was unable to provide hx prior to this stating she dissociates and does not remember how much she drinks. Because Stephanie Riley does not reveal a hx of trauma, therapist will further explore whether Stephanie Riley may be having  "black outs" rather than disassociation.   She says her husband yelled at her. Stephanie Riley says she had been on Naltrexone but ran out. She says her PCP at  [facility] was prescribing it and she does not know if she has an upcoming appointment.  She says she did not call and ask for a refill.  Stephanie Riley would not commit to coming in downstairs for detox, though therapist urged her to and discussed possibilities of what could happen should she continue to drink this volume of alcohol.  MSE:  Stephanie Riley's appearance was casual. Stephanie Riley describes her mood as depressed.  Affect was congruent with mood. Stephanie Riley denies S/HI.  She denies A/VH. There is no evidence of delusions. Her motor behavior was very limited as she was lying down.Stephanie Riley appears to have some  impaired cognitive functioning today, as she had difficulty in remembering, most probably due to amount of alcohol intake.  Insight and judgment are poor.  Plan: Stephanie Riley will not agree to present for detox to [facility]..  She says she may call other facilities because she wants to stay more than 5 days.  Therapist discusses possible facilities she may call to inquire if they accept her insurance.  Stephanie Riley states she plans to go to an [employer], though therapist cautions against presenting while intoxicated. Stephanie Riley says she may just wait until she sees Dr. Morrie Sheldon tomorrow to ask what she recommends. Therapist suggests that [facility] contact this therapist after she completes detox and hopefully treatment and this therapist will schedule her for a CCA if one has not already been completed prior to her appointment.  Therapist sends an in basket message to Dr. Morrie Sheldon apprising her of Stephanie Riley's present condition and her plan.  This is a no charge note as therapist could not do a CCA today.

## 2024-01-21 ENCOUNTER — Encounter (HOSPITAL_COMMUNITY): Payer: Medicare PPO | Admitting: Student in an Organized Health Care Education/Training Program

## 2024-01-21 ENCOUNTER — Encounter (HOSPITAL_COMMUNITY): Payer: Self-pay

## 2024-01-21 ENCOUNTER — Telehealth (HOSPITAL_COMMUNITY): Payer: Self-pay | Admitting: Student in an Organized Health Care Education/Training Program

## 2024-01-21 NOTE — Progress Notes (Deleted)
 BH MD/PA/NP OP Progress Note  01/21/2024 9:39 AM Stephanie Riley  MRN:  [id number]  Chief Complaint:  No chief complaint on file.  HPI: Stephanie Riley is a 73 yo patient with a PPH of MDD, Etoh use disorder, self-reported ADHD, anxiety, and chronic prescribed BZD use (tapered off in 2024).   Current regimen:    - Remeron 45mg  QHS - Buspar 15mg  TID - Naltrexone 50mg  daily, rx by PCP - Zoloft 200mg  daily - Trazodone 150mg  at bedtime - Hydroxyzine 25mg  to QID dosing   Received update/ warm handoff from patient's therapist- Stephanie Riley, LCAS: She reported to therapist she has been drinking 3 oz of liquor each hour for "several" days. She was unable to give an accurate history on her ETOH use. She reported she disassociates at times when she drinks.  Her last drink prior to our session was at 8am. Therapist could not get her to commit to getting someone to bring her in for detox. She says she wants to talk to psychiatrist on appt tomorrow. Therapist emphasized the seriousness of drinking that volume of drinking. Patient focused on her husband divorcing her therapist told her therapist could communicate with downstairs staff in [facility]. She mentioned last time she went in for detox, she had diarrhea and the staff refusing to give her imodium. She said she will discuss detox with you. She had some difficulty holding the camera during the virtual.  Therapist concerned patient is having backout's when drinking.      Visit Diagnosis:  No diagnosis found.     Past Psychiatric History: OPT: Hx of Adderall, Zoloft, Trazodone, Wellbutrin, Xanax, Klonopin, no psychiatry opt 12/2022- INPT at [facility] , patient noted to have yellow/orange sclera on arrival, but cleared by discharge. Dx with Etoh use d/o,. MOCA was done due to cognitive concerns, scored 23/30. 2nd hosp in 2024- [facility], BZD withdrawal Dx: MDD, Etoh use disorder, ADHD, anxiety, and chronic prescribed BZD use. Therapy: marriage  counseling 03/2023- Sent to [facility], for Etoh detox. Patient was already approx 5 days sober from BZDs but still heavily drinking. In [facility] patient had hyperbilirubinemia,  hypokalemia and UTI and was treated for this. She was also placed on Librium taper, patient tolerated well  03/2023- 09/2023. Patient did not f/u with [facility]. Patient presented to [name] and medical ED once prior to admission in 08/2023 related to etoh use d/o. Patient did go to Residential rehab over the summer at [facility] in [place] and did complete the program after this 05/2023 appearance to the ED. Patient was dc'd from [facility] 09/09/2023 for Etoh use d/o, Alcoholic ketoacidosis, elevated CK, aspiration, elevated LFTs, SIRS, hypocalcemia and hypophosphatemia, HTN and has had a f/u with her PCP and has fungal dermatitis. She was dc'd with instructions to continue remeron 7.5mg  at bedtime, zoloft 100mg  daily, naltrexone 50mg  daily, buspar 15mg  tid, trazodone 100mg  at bedtime. 09/2023- Conitnues to endorse severe anxiety, increased remeron on her own to 22mg , at appt increased to 45mg  due to continued anxiety. Conitued Buspar at 15mg  TID and Zoloft 200mg  and trazodone 150mg  at bedtime. Remains sober  09/2023-Telephone: patient endorse severe anxiety and had been taking more than prescribed Remeron, patient was given 11 day supply to get her to next appt but made it clear to patient that she must take meds as prescribed and will not get an another early rx from provider after this 10/2023-Patient anxiety has improved, patient remains sober and is now active going to therapy, communicating with  her healthcare providers, taking medication as prescribed, and going to the gym as she had previously endorsed was her goal.  12/2023- Patient has relapsed, not drinking daily, but still ingesting Etoh on occasion. Attending AA and driving self. Working out at gym, compliant with meds as prescribed. Started Hydroxyzxine for continued anxiety   Past Medical History:   Past Medical History:  Diagnosis Date  . Alcohol abuse   . Benzodiazepine withdrawal without complication (HCC) 02/12/2023  . HTN (hypertension)    No past surgical history on file.  Family Psychiatric History: Both parents had Alzhmier's   Family History: No family history on file.  Social History:  Social History   Socioeconomic History  . Marital status: Married    Spouse name: Not on file  . Number of children: Not on file  . Years of education: Not on file  . Highest education level: Not on file  Occupational History  . Not on file  Tobacco Use  . Smoking status: Never  . Smokeless tobacco: Never  Substance and Sexual Activity  . Alcohol use: Yes  . Drug use: Never  . Sexual activity: Not Currently  Other Topics Concern  . Not on file  Social History Narrative  . Not on file   Social Drivers of Health   Financial Resource Strain: Low Risk  (10/19/2022)   Received from [facility], [facility]   Overall Financial Resource Strain (CARDIA)   . Difficulty of Paying Living Expenses: Not hard at all  Food Insecurity: No Food Insecurity (09/04/2023)   Hunger Vital Sign   . Worried About Programme researcher, broadcasting/film/video in the Last Year: Never true   . Ran Out of Food in the Last Year: Never true  Transportation Needs: No Transportation Needs (09/04/2023)   PRAPARE - Transportation   . Lack of Transportation (Medical): No   . Lack of Transportation (Non-Medical): No  Physical Activity: Insufficiently Active (10/19/2022)   Received from [facility], [facility]   Exercise Vital Sign   . Days of Exercise per Week: 3 days   . Minutes of Exercise per Session: 20 min  Stress: No Stress Concern Present (10/19/2022)   Received from [facility], [facility] Occupational Health - Occupational Stress Questionnaire   . Feeling of Stress : Not at all  Social Connections: Socially Integrated (10/19/2022)   Received from [facility], [facility]    Social Network   . How would you rate your social network (family, work, friends)?: Good participation with social networks    Allergies: No Known Allergies  Metabolic Disorder Labs: Lab Results  Component Value Date   HGBA1C 4.9 05/25/2023   MPG 93.93 05/25/2023   No results found for: "PROLACTIN" Lab Results  Component Value Date   CHOL 145 05/25/2023   TRIG 65 05/25/2023   HDL 91 05/25/2023   CHOLHDL 1.6 05/25/2023   VLDL 13 05/25/2023   LDLCALC 41 05/25/2023   Lab Results  Component Value Date   TSH 1.830 05/25/2023   TSH 3.115 02/11/2023    Therapeutic Level Labs: No results found for: "LITHIUM" No results found for: "VALPROATE" No results found for: "CBMZ"  Current Medications: Current Outpatient Medications  Medication Sig Dispense Refill  . amLODipine (NORVASC) 10 MG tablet Take 1 tablet (10 mg total) by mouth daily. 30 tablet 0  . busPIRone (BUSPAR) 15 MG tablet Take 1 tablet (15 mg total) by mouth 3 (three) times daily. Although insurance would like  90 day supply rx, to encourage compliance and appropriate use 30 day supplies are what provider will be writing. 270 tablet 0  . dextromethorphan-guaiFENesin (MUCINEX DM) 30-600 MG 12hr tablet Take 1 tablet by mouth 2 (two) times daily. 30 tablet 0  . hydrOXYzine (VISTARIL) 25 MG capsule Take 1 capsule (25 mg total) by mouth 3 (three) times daily as needed. 135 capsule 0  . mirtazapine (REMERON SOLTAB) 45 MG disintegrating tablet Take 1 tablet (45 mg total) by mouth at bedtime. 90 tablet 0  . naltrexone (DEPADE) 50 MG tablet Take 1 tablet by mouth daily.    . sertraline (ZOLOFT) 100 MG tablet Take 2 tablets (200 mg total) by mouth daily. 180 tablet 0  . traZODone (DESYREL) 100 MG tablet Take 1 tablet (100 mg total) by mouth at bedtime as needed for sleep. Can take with 150mg  Trazodone tablet. 90 tablet 0  . traZODone (DESYREL) 50 MG tablet Take 1 tablet (50 mg total) by mouth at bedtime. Can take with 100mg  trazodone  tablet nightly. 90 tablet 0   No current facility-administered medications for this visit.     Musculoskeletal: Strength & Muscle Tone: decreased Gait & Station: ataxic Patient leans: Front  Psychiatric Specialty Exam: Review of Systems  Psychiatric/Behavioral:  Negative for dysphoric mood, hallucinations, sleep disturbance and suicidal ideas. The patient is not nervous/anxious.     There were no vitals taken for this visit.There is no height or weight on file to calculate BMI.  General Appearance: Fairly Groomed  Eye Contact:  Good  Speech:  Clear and Coherent  Volume:  Normal  Mood:  Euthymic  Affect:  Appropriate  Thought Process:  Coherent  Orientation:  Full (Time, Place, and Person)  Thought Content: Logical   Suicidal Thoughts:  No  Homicidal Thoughts:  No  Memory:  Immediate;   Good Recent;   Good  Judgement:  Fair  Insight:  Fair  Psychomotor Activity:  Normal  Concentration:  Concentration: Good  Recall:  Fair  Fund of Knowledge: Good  Language: Good  Akathisia:  No  Handed:    AIMS (if indicated): not done  Assets:  Communication Skills Desire for Improvement Financial Resources/Insurance Housing Leisure Time Resilience Social Support  ADL's:  Intact  Cognition: WNL  Sleep:  Good   Screenings: AUDIT    Flowsheet Row ED from 05/25/2023 in [facility] Emergency Department at [facility]  Alcohol Use Disorder Identification Test Final Score (AUDIT) 27      PHQ2-9    Flowsheet Row ED from 05/25/2023 in [facility] ED from 03/19/2023 in [facility] ED from 02/11/2023 in [place]  PHQ-2 Total Score 6 3 1   PHQ-9 Total Score 9 3 4       Flowsheet Row ED to Hosp-Admission (Discharged) from 09/03/2023 in [place] [facility]TH FLOOR [facility]OLOGY Most recent reading at 09/04/2023  5:00 AM ED from 05/25/2023 in [facility] Most recent reading at 05/25/2023  5:34 PM ED from 05/25/2023 in [facility] Most recent reading at 05/25/2023  4:27 PM  C-SSRS RISK CATEGORY No Risk No Risk No Risk        Assessment and Plan: Patient endorses relapsing with EtOH.  But she continues to attend AA and is finding support and continues to journal and reflect on her thoughts and identifies her drinking as a problem.  Patient also identifies that she is triggered  by anxiety and has started to identify what some of her triggers are.  Patient would like to start hydroxyzine as needed to help ease anxiety without drinking EtOH.  Patient does feel that the medications are helping keep the majority of her anxiety at bay however she is having some breakthrough anxiety.  We will continue to monitor dissociation.  Have recommended the patient see her PCP given elevated blood pressures.  Would recommend following up medically as this may be contributing to patient's altered feeling on occasion.  We will also place referral for therapist as patient continues to be interested in therapy but feels that she may need a new therapist.  Patient continues to work on acceptance of her husband asking for divorce.  Patient also appears to be accepting that her drinking is a problem, and identifies that her occasional "sips" for anxiety are not overall beneficial for her health but are more responsive to her immediate want to get rid of her anxious feeling.  Discussed with patient her insurance request for 90-day supply of medications.  Patient reports she feels that she can be compliant with her medications while having 90-day supply of pills.  EtOH use disorder, moderate MDD, recurrent, moderate GAD - Will start Hydorxyzine 25mg  TID PRN - Continue Zoloft 200 mg daily, anxiety and depression - Continue trazodone 150 mg nightly, insomnia - Continue Remeron to 45 mg nightly - Continue BuSpar 15 mg TID, anxiety -- REFERRAL  to THERAPY  Will do 90 day supply F/u in 3 weeks  Collaboration of Care: Collaboration of Care:   Patient/Guardian was advised Release of Information must be obtained prior to any record release in order to collaborate their care with an outside provider. Patient/Guardian was advised if they have not already done so to contact the registration department to sign all necessary forms in order for [place] to release information regarding their care.   Consent: Patient/Guardian gives verbal consent for treatment and assignment of benefits for services provided during this visit. Patient/Guardian expressed understanding and agreed to proceed.   PGY-4 [name], MD 01/21/2024, 9:39 AM

## 2024-01-21 NOTE — Telephone Encounter (Addendum)
Patient called office endorsing that she likely would not be able to make it to in-person appt and was thinking about going to the [facility]. Provider called patient back, and patient reported that she has been drinking more over the week and she feels like she needs to a fresh start.   Patient reports that she is really struggling with the divorce and splitting their properties including their 74 year old dog, which her ex-husband will keep.   Patient reports that she has been drinking since last Wednesday. Patient reports she got 1 bottle of wine last Wednesday, but can not quantify how long that lasted her. Patient reports that she is drinking now almost every hour the last 2 days. Patient reports her last drink was today. Patient is still drinking wine.   Patient is not sleeping well.   Patient has reached out to her ex-husband to take her to the [facility]. Provider discussed with patient about trying to reach out to her sponsor or her friend who takes her to AA instead, to help foster changed in behavior since patient wants to "start anew" and she and her ex-husband are now divorced. Patient was resistant to provider calling emergency services to take her to the hospital. Patient will reach out to her sponsor and friend and provider will check - in in half-hr.   F/U Call 11am  Patient would feel more comfortable having her initial eval at [facility]. Patient is not sure she wants to go for detox. She is nervous to ask for help, but has reached out to her sponsor and will not reach out again to ask them to driver her to [facility], since she would feel more comfortable going here than to a hospital.   F/U 3 pm Patient not interested in detox at this time. Provider discussed that patient should not continue her medications with the exception of her Zoloft, Remeron, and buspar while she is drinking due to increase fall risk and oversedation. Patient denies SI, HI, and AVH. Patient does not meet IVC criteria  at this time and this was discussed with patient. Patient does not intend to drive and remains in contact with her neighbor and her sister coming to visit tom.   PGY-4 Eliseo Gum, MD

## 2024-01-24 ENCOUNTER — Telehealth (HOSPITAL_COMMUNITY): Payer: Self-pay | Admitting: Licensed Clinical Social Worker

## 2024-01-24 ENCOUNTER — Telehealth (HOSPITAL_COMMUNITY): Payer: Self-pay | Admitting: Student in an Organized Health Care Education/Training Program

## 2024-01-24 NOTE — Telephone Encounter (Signed)
Stephanie Riley reports that her last drink was yesterday and she has severe anxiety. Patient reports that her sister is with her. Recommended to patient that she go to a local ED for detox. Patient denies SI.

## 2024-01-24 NOTE — Telephone Encounter (Signed)
The therapist receives a voicemail from [place] saying that Dr. Morrie Sheldon is her psychiatrist and recommended detox. She says that she was sober for a couple of months but "relapsed a little" and wants this therapist's advice concerning where to go in relation to addressing her alcohol problem.  The therapist returns her call leaving a HIPAA-compliant voicemail.  [name], MA, LCSW, [facility], LCAS 01/24/2024

## 2024-01-25 ENCOUNTER — Telehealth (HOSPITAL_COMMUNITY): Payer: Self-pay | Admitting: Licensed Clinical Social Worker

## 2024-01-25 ENCOUNTER — Telehealth (HOSPITAL_COMMUNITY): Payer: Self-pay

## 2024-01-25 ENCOUNTER — Telehealth (HOSPITAL_COMMUNITY): Payer: Self-pay | Admitting: Student in an Organized Health Care Education/Training Program

## 2024-01-25 NOTE — Telephone Encounter (Signed)
The therapist returns Stephanie Riley's telephone call confirming her identity via two identifiers. She says that as she has been unable to get in touch with Dr. Morrie Sheldon so called this therapist to get his input regarding what she should do concerning her drinking.  She says that she did not drink anything yesterday but that she ended up getting a bottle of wine today.  The day before yesterday she estimates that she consumed approximately three fourths of a bottle of wine.   Per notes in epic, Dr. Morrie Sheldon has advised this patient to go to detox and Roquel has had numerous inpatient treatment episodes and has been admitted to a medical floor in the past due to a complicated alcohol and/or benzodiazepine withdrawal.  She was seen a couple of days ago at [facility] and her blood alcohol at that time was 0.05.  The therapist educates her about alcohol withdrawal and her available resources with [place] concluding that she will get her sister to drive her to the [facility] in order to be admitted for detox.  She says that she is also hopeful of getting her psychotropic medications restarted.  [name], MA, LCSW, [facility], LCAS 01/25/2024

## 2024-01-25 NOTE — Telephone Encounter (Signed)
Stephanie Riley calls and leaves a message asking this therapist to call her back.  Therapist calls and verifies she is speaking to the right person by obtaining two verfiers.  Stephanie Riley says she was sober for a couple of months but relapsed on " a little" alcohol. She asks what she should do next and what she should do if she gets in some type of crisis during the inclement weather. Therapist explains ER"s are still open during inclement weather.She denies S/HI.  Therapist asks if she is in a crisis and she notes that she is not, but wanted options for a person with Medicare regarding where to go during inclement weather.  Therapist noted that [name] had tried to outreach her via telephone and she said she never got the message. Therapist suggested she call the person who is going to be her therapist to discuss options.  Stephanie Riley said she did have a small box of alcohol in a box but her sister came up and poured it down the drain.  Therapist provided [employer] phone number.  [name], MS, LMFT, LCAS  [name], MS, LMFT, LCAS

## 2024-01-26 ENCOUNTER — Telehealth (HOSPITAL_COMMUNITY): Payer: Self-pay | Admitting: Licensed Clinical Social Worker

## 2024-01-26 NOTE — Telephone Encounter (Signed)
Stephanie Riley leaves a voicemail saying that she has not slept in two nights and expressing concerns that the Emergency Rooms are typically "packed" so quite possibly has no recall for her conversation with this therapist yesterday about the [facility].  The therapist attempts to reach her leaving a HIPAA-compliant voicemail informing her that the [address] location typically has little to no wait time and providing her with this therapist's direct contact number for when he returns to the office tomorrow as he is working remotely today due to the outpatient offices being closed because of snow.  [name], MA, LCSW, [facility], LCAS 01/26/2024

## 2024-01-28 ENCOUNTER — Telehealth (HOSPITAL_COMMUNITY): Payer: Self-pay | Admitting: Licensed Clinical Social Worker

## 2024-01-28 ENCOUNTER — Telehealth (HOSPITAL_COMMUNITY): Payer: Self-pay

## 2024-01-28 NOTE — Telephone Encounter (Signed)
Therapist retrieves a Recruitment consultant from [place] who is asking for [employer].  She says that she called [facility] and they said they were rerouting patients to the hospital.  She says she "can imagine how that would be".  She requests a return from [place], [place], [place], [facility], LCAS.  This therapist will inform him of the call.  [name], MS, LMFT, LCAS 01-28-24

## 2024-01-28 NOTE — Telephone Encounter (Signed)
The therapist returns Stephanie Riley call confirming her identity via two identifiers. She left a message concerning something about [facility] patients being sent to the ER.  She says that she has been "sipping wine" and that her anxiety is extremely high due to her impending divorce. She asks if the [facility] will send her to detox with the therapist reminding her that [facility] has detox beds.   The therapist observes that [facility] and this therapist have been talking this to death it's time for her to "just do it." He informs her that she needs to work at least as hard to get sober as she has to get drunk. She asks several additional questions due to being fearful but eventually says that she will Benedetto Goad to get here.  [name], MA, LCSW, [facility], LCAS 01/28/2024

## 2024-02-10 ENCOUNTER — Ambulatory Visit (HOSPITAL_COMMUNITY)
Admission: EM | Admit: 2024-02-10 | Discharge: 2024-02-12 | Disposition: A | Discharge status: Other Acute Inpatient Hospital | Attending: Psychiatry | Admitting: Psychiatry

## 2024-02-10 DIAGNOSIS — R45851 Suicidal ideations: Secondary | ICD-10-CM | POA: Diagnosis not present

## 2024-02-10 DIAGNOSIS — R4589 Other symptoms and signs involving emotional state: Secondary | ICD-10-CM | POA: Diagnosis not present

## 2024-02-10 DIAGNOSIS — F39 Unspecified mood [affective] disorder: Secondary | ICD-10-CM | POA: Insufficient documentation

## 2024-02-10 DIAGNOSIS — F32A Depression, unspecified: Secondary | ICD-10-CM | POA: Insufficient documentation

## 2024-02-10 DIAGNOSIS — F1010 Alcohol abuse, uncomplicated: Secondary | ICD-10-CM | POA: Diagnosis present

## 2024-02-10 LAB — POCT URINE DRUG SCREEN - MANUAL ENTRY (I-SCREEN)
POC Amphetamine UR: NOT DETECTED
POC Buprenorphine (BUP): NOT DETECTED
POC Cocaine UR: NOT DETECTED
POC Marijuana UR: NOT DETECTED
POC Methadone UR: NOT DETECTED
POC Methamphetamine UR: NOT DETECTED
POC Morphine: NOT DETECTED
POC Oxazepam (BZO): NOT DETECTED
POC Oxycodone UR: NOT DETECTED
POC Secobarbital (BAR): NOT DETECTED

## 2024-02-10 MED ORDER — TRAZODONE HCL 50 MG PO TABS
50.0000 mg | ORAL_TABLET | Freq: Every day | ORAL | Status: DC
  Administered 2024-02-10 – 2024-02-11 (×2): 50 mg via ORAL
  Filled 2024-02-10 (×2): qty 1

## 2024-02-10 MED ORDER — ALUM & MAG HYDROXIDE-SIMETH 200-200-20 MG/5ML PO SUSP: 30.0000 mL | ORAL | Status: DC | PRN

## 2024-02-10 MED ORDER — OLANZAPINE 10 MG IM SOLR: 5.0000 mg | Freq: Three times a day (TID) | INTRAMUSCULAR | Status: DC | PRN

## 2024-02-10 MED ORDER — AMLODIPINE BESYLATE 10 MG PO TABS
10.0000 mg | ORAL_TABLET | Freq: Every day | ORAL | Status: DC
  Administered 2024-02-11 – 2024-02-12 (×2): 10 mg via ORAL
  Filled 2024-02-10 (×2): qty 1

## 2024-02-10 MED ORDER — MAGNESIUM HYDROXIDE 400 MG/5ML PO SUSP: 30.0000 mL | Freq: Every day | ORAL | Status: DC | PRN

## 2024-02-10 MED ORDER — OLANZAPINE 5 MG PO TBDP: 5.0000 mg | ORAL_TABLET | Freq: Three times a day (TID) | ORAL | Status: DC | PRN

## 2024-02-10 MED ORDER — SERTRALINE HCL 100 MG PO TABS
200.0000 mg | ORAL_TABLET | Freq: Every day | ORAL | Status: DC
  Administered 2024-02-11 – 2024-02-12 (×2): 200 mg via ORAL
  Filled 2024-02-10 (×2): qty 2

## 2024-02-10 MED ORDER — TRAZODONE HCL 100 MG PO TABS
100.0000 mg | ORAL_TABLET | Freq: Every evening | ORAL | Status: DC | PRN
Start: 2024-02-10 — End: 2024-02-12
  Administered 2024-02-11: 100 mg via ORAL
  Filled 2024-02-10: qty 1

## 2024-02-10 MED ORDER — MIRTAZAPINE 15 MG PO TBDP
45.0000 mg | ORAL_TABLET | Freq: Every day | ORAL | Status: DC
  Administered 2024-02-10 – 2024-02-11 (×2): 45 mg via ORAL
  Filled 2024-02-10 (×2): qty 3

## 2024-02-10 MED ORDER — ACETAMINOPHEN 325 MG PO TABS: 650.0000 mg | ORAL_TABLET | Freq: Four times a day (QID) | ORAL | Status: DC | PRN

## 2024-02-10 MED ORDER — BUSPIRONE HCL 15 MG PO TABS
15.0000 mg | ORAL_TABLET | Freq: Three times a day (TID) | ORAL | Status: DC
  Administered 2024-02-10 – 2024-02-12 (×6): 15 mg via ORAL
  Filled 2024-02-10 (×6): qty 1

## 2024-02-10 MED ORDER — OLANZAPINE 10 MG IM SOLR: 10.0000 mg | Freq: Three times a day (TID) | INTRAMUSCULAR | Status: DC | PRN

## 2024-02-10 NOTE — ED Provider Notes (Signed)
 [facility] Continuous Assessment Admission H&P  Date: 02/11/24 Patient Name: Stephanie Riley MRN: [id number] Chief Complaint: I'm having a nervous breakdown   Diagnoses:  Final diagnoses:  Alcohol abuse  Episodic mood disorder (HCC)  Ineffective coping    HPI: Stephanie Riley,  73 y/o female with history of alcohol abuse, GAD, MDD, substance-induced mood disorder, insomnia, adjustment disorder and ADHD, presented to [facility] voluntarily.  Per the patient she is having a nervous breakdown because her husband and her was going through a divorce and she found out that he divorced her without she knowing.  According to patient they have been together for 50 years.  Patient stated that she does see a psychiatrist here at the walk-in clinic.  Patient stated she she was last hospitalized in October 2024 for alcohol problems.  Patient denies having a therapist at this time.  A face-to-face evaluation of patient, patient is alert and oriented x 4, speech is clear, maintain eye contact.  Patient answers questions appropriately however patient does seem to be very indecisive and having a hard time making up her mind if she wanted to be admitted or not.  Patient keep asking questions about what would happen during the middle of the night if we are going give her a second dose of sleeping medicine and if we are going to give her Valium or what are we going to give her.  Writer discussed with patient that we do have a protocol that we go by so if she is going into withdrawal when we do have a protocol for that.  Patient denies current SI, HI, AVH or paranoia.  Patient denies smoking.  According to patient she does drink on a daily basis because of the issues that she is having patient stated she basically sleeps on wine throughout the day.  Patient denies have access to guns denied wanting to hurt herself or others at this time.  Patient does not seem to be influenced by internal or external stimuli at this  time.  Review of patient's records show multiple encounters for alcohol abuse.  Writer discussed with patient the need for admissions and further evaluation.  Patient was given enough time to make a decision if she wanted to stay patient was able to make a phone call to her daughter to make up her mind whether or not she wanted to be admitted.  At the end of of 10 to 15 minutes patient made up her mind and stated that she would agree to stay to get the help that she needed.  PHQ9 was done pt scored 24   Recommend observation until a bed is available [facility].  Total Time spent with patient: 30 minutes  Musculoskeletal  Strength & Muscle Tone: within normal limits Gait & Station: normal Patient leans: N/A  Psychiatric Specialty Exam  Presentation General Appearance:  Casual  Eye Contact: Good  Speech: Clear and Coherent  Speech Volume: Normal  Handedness: Right   Mood and Affect  Mood: Euthymic  Affect: Congruent   Thought Process  Thought Processes: Coherent  Descriptions of Associations:Intact  Orientation:Full (Time, Place and Person)  Thought Content:WDL  Diagnosis of Schizophrenia or Schizoaffective disorder in past: No   Hallucinations:Hallucinations: None  Ideas of Reference:None  Suicidal Thoughts:Suicidal Thoughts: No  Homicidal Thoughts:Homicidal Thoughts: No   Sensorium  Memory: Immediate Fair  Judgment: Fair  Insight: Fair   Art therapist  Concentration: Fair  Attention Span: Fair  Recall: [employer] of Knowledge: Fair  Language: Fair   Psychomotor Activity  Psychomotor Activity: Psychomotor Activity: Normal   Assets  Assets: Desire for Improvement; Resilience; Social Support   Sleep  Sleep: Sleep: Fair Number of Hours of Sleep: 8   Nutritional Assessment (For OBS and [facility] admissions only) Has the patient had a weight loss or gain of 10 pounds or more in the last 3 months?: No Has the patient had a  decrease in food intake/or appetite?: No Does the patient have dental problems?: No Does the patient have eating habits or behaviors that may be indicators of an eating disorder including binging or inducing vomiting?: No Has the patient recently lost weight without trying?: 0 Has the patient been eating poorly because of a decreased appetite?: 0 Malnutrition Screening Tool Score: 0    Physical Exam HENT:     Head: Normocephalic.     Nose: Nose normal.  Eyes:     Pupils: Pupils are equal, round, and reactive to light.  Cardiovascular:     Rate and Rhythm: Normal rate.  Pulmonary:     Effort: Pulmonary effort is normal.  Musculoskeletal:        General: Normal range of motion.     Cervical back: Normal range of motion.  Neurological:     General: No focal deficit present.     Mental Status: She is alert.  Psychiatric:        Mood and Affect: Mood normal.        Behavior: Behavior normal.        Thought Content: Thought content normal.        Judgment: Judgment normal.    Review of Systems  Constitutional: Negative.   HENT: Negative.    Eyes: Negative.   Respiratory: Negative.    Cardiovascular: Negative.   Gastrointestinal: Negative.   Genitourinary: Negative.   Musculoskeletal: Negative.   Skin: Negative.   Neurological: Negative.   Psychiatric/Behavioral:  Positive for depression and substance abuse. The patient is nervous/anxious.     Blood pressure (!) 155/100, pulse 84, temperature 97.8 F (36.6 C), temperature source Oral, resp. rate 19, SpO2 99%. There is no height or weight on file to calculate BMI.  Past Psychiatric History: alcohol abuse,  MDD, adjustment disorder, mood disorder  Is the patient at risk to self? No  Has the patient been a risk to self in the past 6 months? No .    Has the patient been a risk to self within the distant past? No   Is the patient a risk to others? No   Has the patient been a risk to others in the past 6 months? No   Has  the patient been a risk to others within the distant past? No   Past Medical History: See chart  Family History: Unknown  Social History: Alcohol abuse  Last Labs:  Admission on 02/10/2024  Component Date Value Ref Range Status   WBC 02/10/2024 7.0  4.0 - 10.5 K/uL Final   RBC 02/10/2024 4.82  3.87 - 5.11 MIL/uL Final   Hemoglobin 02/10/2024 14.4  12.0 - 15.0 g/dL Final   HCT 14/78/2956 42.3  36.0 - 46.0 % Final   MCV 02/10/2024 87.8  80.0 - 100.0 fL Final   MCH 02/10/2024 29.9  26.0 - 34.0 pg Final   MCHC 02/10/2024 34.0  30.0 - 36.0 g/dL Final   RDW 21/30/8657 13.2  11.5 - 15.5 % Final   Platelets 02/10/2024 408 (H)  150 - 400 K/uL Final  nRBC 02/10/2024 0.0  0.0 - 0.2 % Final   Neutrophils Relative % 02/10/2024 61  % Final   Neutro Abs 02/10/2024 4.3  1.7 - 7.7 K/uL Final   Lymphocytes Relative 02/10/2024 26  % Final   Lymphs Abs 02/10/2024 1.8  0.7 - 4.0 K/uL Final   Monocytes Relative 02/10/2024 10  % Final   Monocytes Absolute 02/10/2024 0.7  0.1 - 1.0 K/uL Final   Eosinophils Relative 02/10/2024 1  % Final   Eosinophils Absolute 02/10/2024 0.1  0.0 - 0.5 K/uL Final   Basophils Relative 02/10/2024 1  % Final   Basophils Absolute 02/10/2024 0.1  0.0 - 0.1 K/uL Final   Immature Granulocytes 02/10/2024 1  % Final   Abs Immature Granulocytes 02/10/2024 0.06  0.00 - 0.07 K/uL Final   Performed at [facility], [address], [place], [place] [address]   Sodium 02/10/2024 142  135 - 145 mmol/L Final   Potassium 02/10/2024 3.6  3.5 - 5.1 mmol/L Final   Chloride 02/10/2024 101  98 - 111 mmol/L Final   CO2 02/10/2024 25  22 - 32 mmol/L Final   Glucose, Bld 02/10/2024 104 (H)  70 - 99 mg/dL Final   Glucose reference range applies only to samples taken after fasting for at least 8 hours.   BUN 02/10/2024 8  8 - 23 mg/dL Final   Creatinine, Ser 02/10/2024 0.80  0.44 - 1.00 mg/dL Final   Calcium 69/62/9528 10.1  8.9 - 10.3 mg/dL Final   Total Protein 41/32/4401 7.3  6.5 -  8.1 g/dL Final   Albumin 02/72/5366 4.5  3.5 - 5.0 g/dL Final   AST 44/02/4741 22  15 - 41 U/L Final   ALT 02/10/2024 20  0 - 44 U/L Final   Alkaline Phosphatase 02/10/2024 85  38 - 126 U/L Final   Total Bilirubin 02/10/2024 0.9  0.0 - 1.2 mg/dL Final   GFR, Estimated 02/10/2024 >60  >60 mL/min Final   Comment: (NOTE) Calculated using the CKD-EPI Creatinine Equation (2021)    Anion gap 02/10/2024 16 (H)  5 - 15 Final   Performed at [facility], [address] [place], [place] [address]   Alcohol, Ethyl (B) 02/10/2024 <10  <10 mg/dL Final   Comment: (NOTE) Lowest detectable limit for serum alcohol is 10 mg/dL.  For medical purposes only. Performed at [facility], [address], [place], [place] [address]    Free T4 02/10/2024 0.89  0.61 - 1.12 ng/dL Final   Comment: (NOTE) Biotin ingestion may interfere with free T4 tests. If the results are inconsistent with the TSH level, previous test results, or the clinical presentation, then consider biotin interference. If needed, order repeat testing after stopping biotin. Performed at [name], [address], [place], [place] [address]    POC Amphetamine UR 02/10/2024 None Detected  NONE DETECTED (Cut Off Level 1000 ng/mL) Final   POC Secobarbital (BAR) 02/10/2024 None Detected  NONE DETECTED (Cut Off Level 300 ng/mL) Final   POC Buprenorphine (BUP) 02/10/2024 None Detected  NONE DETECTED (Cut Off Level 10 ng/mL) Final   POC Oxazepam (BZO) 02/10/2024 None Detected  NONE DETECTED (Cut Off Level 300 ng/mL) Final   POC Cocaine UR 02/10/2024 None Detected  NONE DETECTED (Cut Off Level 300 ng/mL) Final   POC Methamphetamine UR 02/10/2024 None Detected  NONE DETECTED (Cut Off Level 1000 ng/mL) Final   POC Morphine 02/10/2024 None Detected  NONE DETECTED (  Cut Off Level 300 ng/mL) Final   POC Methadone UR 02/10/2024 None Detected  NONE DETECTED (Cut Off Level 300 ng/mL) Final   POC Oxycodone UR 02/10/2024 None Detected   NONE DETECTED (Cut Off Level 100 ng/mL) Final   POC Marijuana UR 02/10/2024 None Detected  NONE DETECTED (Cut Off Level 50 ng/mL) Final   TSH 02/10/2024 3.122  0.350 - 4.500 uIU/mL Final   Comment: Performed by a 3rd Generation assay with a functional sensitivity of <=0.01 uIU/mL. Performed at [facility], [address], [place], [place] [address]   No results displayed because visit has over 200 results.      Allergies: Patient has no known allergies.  Medications:  Facility Ordered Medications  Medication   acetaminophen (TYLENOL) tablet 650 mg   alum & mag hydroxide-simeth (MAALOX/MYLANTA) 200-200-20 MG/5ML suspension 30 mL   magnesium hydroxide (MILK OF MAGNESIA) suspension 30 mL   OLANZapine zydis (ZYPREXA) disintegrating tablet 5 mg   OLANZapine (ZYPREXA) injection 5 mg   OLANZapine (ZYPREXA) injection 10 mg   amLODipine (NORVASC) tablet 10 mg   busPIRone (BUSPAR) tablet 15 mg   mirtazapine (REMERON SOL-TAB) disintegrating tablet 45 mg   sertraline (ZOLOFT) tablet 200 mg   traZODone (DESYREL) tablet 100 mg   traZODone (DESYREL) tablet 50 mg   PTA Medications  Medication Sig   naltrexone (DEPADE) 50 MG tablet Take 1 tablet by mouth daily.   amLODipine (NORVASC) 10 MG tablet Take 1 tablet (10 mg total) by mouth daily.   dextromethorphan-guaiFENesin (MUCINEX DM) 30-600 MG 12hr tablet Take 1 tablet by mouth 2 (two) times daily.   busPIRone (BUSPAR) 15 MG tablet Take 1 tablet (15 mg total) by mouth 3 (three) times daily. Although insurance would like 90 day supply rx, to encourage compliance and appropriate use 30 day supplies are what provider will be writing.   mirtazapine (REMERON SOLTAB) 45 MG disintegrating tablet Take 1 tablet (45 mg total) by mouth at bedtime.   sertraline (ZOLOFT) 100 MG tablet Take 2 tablets (200 mg total) by mouth daily.   traZODone (DESYREL) 50 MG tablet Take 1 tablet (50 mg total) by mouth at bedtime. Can take with 100mg  trazodone tablet  nightly.   traZODone (DESYREL) 100 MG tablet Take 1 tablet (100 mg total) by mouth at bedtime as needed for sleep. Can take with 150mg  Trazodone tablet.   hydrOXYzine (VISTARIL) 25 MG capsule Take 1 capsule (25 mg total) by mouth 3 (three) times daily as needed.      Medical Decision Making  Observation unit    Recommendations  Based on my evaluation the patient does not appear to have an emergency medical condition.  Sindy Guadeloupe, NP 02/11/24  4:24 AM

## 2024-02-10 NOTE — Progress Notes (Signed)
   02/10/24 1740  [facility] Triage Screening (Walk-ins at [facility] only)  How Did You Hear About [place]? Self  What Is the Reason for Your Visit/Call Today? Stephanie Riley is a 73 year old female presenting to [name] voluntarily with chief complaint of worsening depression and increased anxiety for the past 3 months. Pt reports her divorced finalized in February and she feels like things are "crashing down" on her. Pt states that she relapsed two weeks ago and she has been sipping 8 ounces of wine daily. Pt denies SI, HI, AVH. She states that she did not want to be home by herself and was hoping to be observed overnight in case of withdrawal symptoms. Pt sees Dr. Morrie Sheldon for medication management.  How Long Has This Been Causing You Problems? 1-6 months  Have You Recently Had Any Thoughts About Hurting Yourself? No  Are You Planning to Commit Suicide/Harm Yourself At This time? No  Have you Recently Had Thoughts About Hurting Someone [name]? No  Are You Planning To Harm Someone At This Time? No  Physical Abuse Denies  Verbal Abuse Yes, past (Comment)  Sexual Abuse Denies  Exploitation of patient/patient's resources Denies  Self-Neglect Denies  Are you currently experiencing any auditory, visual or other hallucinations? No  Have You Used Any Alcohol or Drugs in the Past 24 Hours? Yes  What Did You Use and How Much? this am drunk 8 ounces of wine  Do you have any current medical co-morbidities that require immediate attention? No  Clinician description of patient physical appearance/behavior: anxious and depressed  What Do You Feel Would Help You the Most Today? Treatment for Depression or other mood problem;Alcohol or Drug Use Treatment  If access to [facility] was not available, would you have sought care in the Emergency Department? No  Determination of Need Routine (7 days)  Options For Referral Outpatient Therapy;Medication Management;Intensive Outpatient Therapy

## 2024-02-11 ENCOUNTER — Other Ambulatory Visit: Payer: Self-pay

## 2024-02-11 DIAGNOSIS — R4589 Other symptoms and signs involving emotional state: Secondary | ICD-10-CM | POA: Diagnosis not present

## 2024-02-11 DIAGNOSIS — F1010 Alcohol abuse, uncomplicated: Secondary | ICD-10-CM | POA: Diagnosis not present

## 2024-02-11 DIAGNOSIS — F39 Unspecified mood [affective] disorder: Secondary | ICD-10-CM | POA: Diagnosis not present

## 2024-02-11 LAB — CBC WITH DIFFERENTIAL/PLATELET
Abs Immature Granulocytes: 0.06 10*3/uL (ref 0.00–0.07)
Basophils Absolute: 0.1 10*3/uL (ref 0.0–0.1)
Basophils Relative: 1 %
Eosinophils Absolute: 0.1 10*3/uL (ref 0.0–0.5)
Eosinophils Relative: 1 %
HCT: 42.3 % (ref 36.0–46.0)
Hemoglobin: 14.4 g/dL (ref 12.0–15.0)
Immature Granulocytes: 1 %
Lymphocytes Relative: 26 %
Lymphs Abs: 1.8 10*3/uL (ref 0.7–4.0)
MCH: 29.9 pg (ref 26.0–34.0)
MCHC: 34 g/dL (ref 30.0–36.0)
MCV: 87.8 fL (ref 80.0–100.0)
Monocytes Absolute: 0.7 10*3/uL (ref 0.1–1.0)
Monocytes Relative: 10 %
Neutro Abs: 4.3 10*3/uL (ref 1.7–7.7)
Neutrophils Relative %: 61 %
Platelets: 408 10*3/uL — ABNORMAL HIGH (ref 150–400)
RBC: 4.82 MIL/uL (ref 3.87–5.11)
RDW: 13.2 % (ref 11.5–15.5)
WBC: 7 10*3/uL (ref 4.0–10.5)
nRBC: 0 % (ref 0.0–0.2)

## 2024-02-11 LAB — COMPREHENSIVE METABOLIC PANEL
ALT: 20 U/L (ref 0–44)
AST: 22 U/L (ref 15–41)
Albumin: 4.5 g/dL (ref 3.5–5.0)
Alkaline Phosphatase: 85 U/L (ref 38–126)
Anion gap: 16 — ABNORMAL HIGH (ref 5–15)
BUN: 8 mg/dL (ref 8–23)
CO2: 25 mmol/L (ref 22–32)
Calcium: 10.1 mg/dL (ref 8.9–10.3)
Chloride: 101 mmol/L (ref 98–111)
Creatinine, Ser: 0.8 mg/dL (ref 0.44–1.00)
GFR, Estimated: >60 mL/min (ref >60)
Glucose, Bld: 104 mg/dL — ABNORMAL HIGH (ref 70–99)
Potassium: 3.6 mmol/L (ref 3.5–5.1)
Sodium: 142 mmol/L (ref 135–145)
Total Bilirubin: 0.9 mg/dL (ref 0.0–1.2)
Total Protein: 7.3 g/dL (ref 6.5–8.1)

## 2024-02-11 LAB — T4, FREE: Free T4: 0.89 ng/dL (ref 0.61–1.12)

## 2024-02-11 LAB — TSH: TSH: 3.122 u[IU]/mL (ref 0.350–4.500)

## 2024-02-11 LAB — ETHANOL: Alcohol, Ethyl (B): <10 mg/dL (ref <10)

## 2024-02-11 MED ORDER — LORAZEPAM 1 MG PO TABS
1.0000 mg | ORAL_TABLET | Freq: Three times a day (TID) | ORAL | Status: DC
  Administered 2024-02-12: 1 mg via ORAL
  Filled 2024-02-11: qty 1

## 2024-02-11 MED ORDER — LOPERAMIDE HCL 2 MG PO CAPS
2.0000 mg | ORAL_CAPSULE | ORAL | Status: DC | PRN
  Administered 2024-02-11: 2 mg via ORAL
  Filled 2024-02-11: qty 1

## 2024-02-11 MED ORDER — LORAZEPAM 1 MG PO TABS: 1.0000 mg | ORAL_TABLET | Freq: Every day | ORAL | Status: DC

## 2024-02-11 MED ORDER — THIAMINE MONONITRATE 100 MG PO TABS
100.0000 mg | ORAL_TABLET | Freq: Every day | ORAL | Status: DC
  Administered 2024-02-12: 100 mg via ORAL
  Filled 2024-02-11: qty 1

## 2024-02-11 MED ORDER — LORAZEPAM 1 MG PO TABS: 1.0000 mg | ORAL_TABLET | Freq: Four times a day (QID) | ORAL | Status: DC | PRN

## 2024-02-11 MED ORDER — LOPERAMIDE HCL 2 MG PO CAPS
4.0000 mg | ORAL_CAPSULE | Freq: Once | ORAL | Status: AC
  Administered 2024-02-11: 4 mg via ORAL
  Filled 2024-02-11: qty 2

## 2024-02-11 MED ORDER — ADULT MULTIVITAMIN W/MINERALS CH
1.0000 | ORAL_TABLET | Freq: Every day | ORAL | Status: DC
  Administered 2024-02-11 – 2024-02-12 (×2): 1 via ORAL
  Filled 2024-02-11 (×2): qty 1

## 2024-02-11 MED ORDER — ONDANSETRON 4 MG PO TBDP: 4.0000 mg | ORAL_TABLET | Freq: Four times a day (QID) | ORAL | Status: DC | PRN

## 2024-02-11 MED ORDER — LORAZEPAM 1 MG PO TABS
1.0000 mg | ORAL_TABLET | Freq: Four times a day (QID) | ORAL | Status: AC
  Administered 2024-02-11 – 2024-02-12 (×4): 1 mg via ORAL
  Filled 2024-02-11 (×4): qty 1

## 2024-02-11 MED ORDER — HYDROXYZINE HCL 25 MG PO TABS
25.0000 mg | ORAL_TABLET | Freq: Four times a day (QID) | ORAL | Status: DC | PRN
  Administered 2024-02-11 – 2024-02-12 (×3): 25 mg via ORAL
  Filled 2024-02-11 (×3): qty 1

## 2024-02-11 MED ORDER — THIAMINE HCL 100 MG/ML IJ SOLN
100.0000 mg | Freq: Once | INTRAMUSCULAR | Status: AC
  Administered 2024-02-11: 100 mg via INTRAMUSCULAR
  Filled 2024-02-11: qty 2

## 2024-02-11 MED ORDER — LORAZEPAM 1 MG PO TABS: 1.0000 mg | ORAL_TABLET | Freq: Two times a day (BID) | ORAL | Status: DC

## 2024-02-11 NOTE — ED Provider Notes (Signed)
 Behavioral Health Progress Note  Date and Time: 02/11/2024 12:22 PM Name: Stephanie Riley MRN:  [id number]  Subjective:  Stephanie Riley,  73 y/o female with history of alcohol abuse, GAD, MDD, substance-induced mood disorder, insomnia, adjustment disorder and ADHD, presented to [facility] voluntarily.  Per the patient she is having a nervous breakdown because her husband and her was going through a divorce and she found out that he divorced her without she knowing.  According to patient they have been together for 50 years.  Patient stated that she does see a psychiatrist here at the walk-in clinic.  Patient stated she was last hospitalized in October 2024 for alcohol problems.  Patient denies having a therapist at this time.   Objective: Patient is seen and examined sitting up on her bed. She presents alert, restless, cooperative and oriented to person, place, time and situation. Speech is clear, coherent, with normal volume and pattern. Thought process logical, organized and coherent. Thought content with fair insight and judgement. Obviously, not responding to internal or external stimuli. She denies SI/HI/AVH. However, reports that she feels she is not in touch in reality due to not being able to sleep. Vs reviewed without critical values.  Patient reports loose stools x 2 increasing agitation. Ativan detox protocol initiated with CIWA monitoring.  Diagnosis: AUD with ineffective coping Final diagnoses:  Alcohol abuse  Episodic mood disorder (HCC)  Ineffective coping   Total Time spent with patient: 45 minutes  Past Psychiatric History: See H&P Past Medical History: See H&P Family History: See H&P. Family Psychiatric  History: See Social History: See H&P  Additional Social History:    Sleep: Poor  Appetite:  Fair  Current Medications:  Current Facility-Administered Medications  Medication Dose Route Frequency Provider Last Rate Last Admin   acetaminophen (TYLENOL) tablet 650 mg  650  mg Oral Q6H PRN Sindy Guadeloupe, NP       alum & mag hydroxide-simeth (MAALOX/MYLANTA) 200-200-20 MG/5ML suspension 30 mL  30 mL Oral Q4H PRN Sindy Guadeloupe, NP       amLODipine (NORVASC) tablet 10 mg  10 mg Oral Daily Sindy Guadeloupe, NP   10 mg at 02/11/24 0912   busPIRone (BUSPAR) tablet 15 mg  15 mg Oral TID Sindy Guadeloupe, NP   15 mg at 02/11/24 0912   magnesium hydroxide (MILK OF MAGNESIA) suspension 30 mL  30 mL Oral Daily PRN Sindy Guadeloupe, NP       mirtazapine (REMERON SOL-TAB) disintegrating tablet 45 mg  45 mg Oral QHS Sindy Guadeloupe, NP   45 mg at 02/10/24 2337   OLANZapine (ZYPREXA) injection 10 mg  10 mg Intramuscular TID PRN Sindy Guadeloupe, NP       OLANZapine (ZYPREXA) injection 5 mg  5 mg Intramuscular TID PRN Sindy Guadeloupe, NP       OLANZapine zydis (ZYPREXA) disintegrating tablet 5 mg  5 mg Oral TID PRN Sindy Guadeloupe, NP       sertraline (ZOLOFT) tablet 200 mg  200 mg Oral Daily Sindy Guadeloupe, NP   200 mg at 02/11/24 0912   traZODone (DESYREL) tablet 100 mg  100 mg Oral QHS PRN Sindy Guadeloupe, NP   100 mg at 02/11/24 0156   traZODone (DESYREL) tablet 50 mg  50 mg Oral Dorthey Sawyer, NP   50 mg at 02/10/24 2338   Current Outpatient Medications  Medication Sig Dispense Refill   amLODipine (NORVASC) 10 MG tablet Take 1 tablet (10 mg total) by mouth daily. 30 tablet 0  B Complex Vitamins (B COMPLEX PO) Take 1 tablet by mouth daily.     busPIRone (BUSPAR) 15 MG tablet Take 1 tablet (15 mg total) by mouth 3 (three) times daily. Although insurance would like 90 day supply rx, to encourage compliance and appropriate use 30 day supplies are what provider will be writing. (Patient taking differently: Take 15 mg by mouth 3 (three) times daily.) 270 tablet 0   ferrous sulfate 325 (65 FE) MG tablet Take 325 mg by mouth every morning.     hydrOXYzine (VISTARIL) 25 MG capsule Take 1 capsule (25 mg total) by mouth 3 (three) times daily as needed. (Patient taking differently: Take 25 mg by mouth 3  (three) times daily.) 135 capsule 0   mirtazapine (REMERON SOLTAB) 45 MG disintegrating tablet Take 1 tablet (45 mg total) by mouth at bedtime. 90 tablet 0   Multiple Vitamins-Minerals (ADULT GUMMY PO) Take 2 tablets by mouth daily.     naltrexone (DEPADE) 50 MG tablet Take 1 tablet by mouth daily.     sertraline (ZOLOFT) 100 MG tablet Take 2 tablets (200 mg total) by mouth daily. 180 tablet 0   traZODone (DESYREL) 100 MG tablet Take 1 tablet (100 mg total) by mouth at bedtime as needed for sleep. Can take with 150mg  Trazodone tablet. (Patient taking differently: Take 100 mg by mouth at bedtime.) 90 tablet 0   traZODone (DESYREL) 50 MG tablet Take 1 tablet (50 mg total) by mouth at bedtime. Can take with 100mg  trazodone tablet nightly. (Patient taking differently: Take 50 mg by mouth at bedtime as needed for sleep.) 90 tablet 0   Labs  Lab Results:  Admission on 02/10/2024  Component Date Value Ref Range Status   WBC 02/10/2024 7.0  4.0 - 10.5 K/uL Final   RBC 02/10/2024 4.82  3.87 - 5.11 MIL/uL Final   Hemoglobin 02/10/2024 14.4  12.0 - 15.0 g/dL Final   HCT 30/86/5784 42.3  36.0 - 46.0 % Final   MCV 02/10/2024 87.8  80.0 - 100.0 fL Final   MCH 02/10/2024 29.9  26.0 - 34.0 pg Final   MCHC 02/10/2024 34.0  30.0 - 36.0 g/dL Final   RDW 69/62/9528 13.2  11.5 - 15.5 % Final   Platelets 02/10/2024 408 (H)  150 - 400 K/uL Final   nRBC 02/10/2024 0.0  0.0 - 0.2 % Final   Neutrophils Relative % 02/10/2024 61  % Final   Neutro Abs 02/10/2024 4.3  1.7 - 7.7 K/uL Final   Lymphocytes Relative 02/10/2024 26  % Final   Lymphs Abs 02/10/2024 1.8  0.7 - 4.0 K/uL Final   Monocytes Relative 02/10/2024 10  % Final   Monocytes Absolute 02/10/2024 0.7  0.1 - 1.0 K/uL Final   Eosinophils Relative 02/10/2024 1  % Final   Eosinophils Absolute 02/10/2024 0.1  0.0 - 0.5 K/uL Final   Basophils Relative 02/10/2024 1  % Final   Basophils Absolute 02/10/2024 0.1  0.0 - 0.1 K/uL Final   Immature Granulocytes  02/10/2024 1  % Final   Abs Immature Granulocytes 02/10/2024 0.06  0.00 - 0.07 K/uL Final   Performed at [facility], [address] [place], [place] [address]   Sodium 02/10/2024 142  135 - 145 mmol/L Final   Potassium 02/10/2024 3.6  3.5 - 5.1 mmol/L Final   Chloride 02/10/2024 101  98 - 111 mmol/L Final   CO2 02/10/2024 25  22 - 32 mmol/L Final   Glucose, Bld 02/10/2024 104 (  H)  70 - 99 mg/dL Final   Glucose reference range applies only to samples taken after fasting for at least 8 hours.   BUN 02/10/2024 8  8 - 23 mg/dL Final   Creatinine, Ser 02/10/2024 0.80  0.44 - 1.00 mg/dL Final   Calcium 04/54/0981 10.1  8.9 - 10.3 mg/dL Final   Total Protein 19/14/7829 7.3  6.5 - 8.1 g/dL Final   Albumin 56/21/3086 4.5  3.5 - 5.0 g/dL Final   AST 57/84/6962 22  15 - 41 U/L Final   ALT 02/10/2024 20  0 - 44 U/L Final   Alkaline Phosphatase 02/10/2024 85  38 - 126 U/L Final   Total Bilirubin 02/10/2024 0.9  0.0 - 1.2 mg/dL Final   GFR, Estimated 02/10/2024 >60  >60 mL/min Final   Comment: (NOTE) Calculated using the CKD-EPI Creatinine Equation (2021)    Anion gap 02/10/2024 16 (H)  5 - 15 Final   Performed at [facility], [address], [place], [place] [address]   Alcohol, Ethyl (B) 02/10/2024 <10  <10 mg/dL Final   Comment: (NOTE) Lowest detectable limit for serum alcohol is 10 mg/dL.  For medical purposes only. Performed at [facility], [address], [place], [place] [address]    Free T4 02/10/2024 0.89  0.61 - 1.12 ng/dL Final   Comment: (NOTE) Biotin ingestion may interfere with free T4 tests. If the results are inconsistent with the TSH level, previous test results, or the clinical presentation, then consider biotin interference. If needed, order repeat testing after stopping biotin. Performed at [facility], [address] [place], [place] [address]    POC Amphetamine UR 02/10/2024 None Detected  NONE DETECTED (Cut Off Level 1000 ng/mL) Final    POC Secobarbital (BAR) 02/10/2024 None Detected  NONE DETECTED (Cut Off Level 300 ng/mL) Final   POC Buprenorphine (BUP) 02/10/2024 None Detected  NONE DETECTED (Cut Off Level 10 ng/mL) Final   POC Oxazepam (BZO) 02/10/2024 None Detected  NONE DETECTED (Cut Off Level 300 ng/mL) Final   POC Cocaine UR 02/10/2024 None Detected  NONE DETECTED (Cut Off Level 300 ng/mL) Final   POC Methamphetamine UR 02/10/2024 None Detected  NONE DETECTED (Cut Off Level 1000 ng/mL) Final   POC Morphine 02/10/2024 None Detected  NONE DETECTED (Cut Off Level 300 ng/mL) Final   POC Methadone UR 02/10/2024 None Detected  NONE DETECTED (Cut Off Level 300 ng/mL) Final   POC Oxycodone UR 02/10/2024 None Detected  NONE DETECTED (Cut Off Level 100 ng/mL) Final   POC Marijuana UR 02/10/2024 None Detected  NONE DETECTED (Cut Off Level 50 ng/mL) Final   TSH 02/10/2024 3.122  0.350 - 4.500 uIU/mL Final   Comment: Performed by a 3rd Generation assay with a functional sensitivity of <=0.01 uIU/mL. Performed at [facility], [address] [place], [place] [address]   No results displayed because visit has over 200 results.     Blood Alcohol level:  Lab Results  Component Value Date   ETH <10 02/10/2024   ETH 215 (H) 09/03/2023   Metabolic Disorder Labs: Lab Results  Component Value Date   HGBA1C 4.9 05/25/2023   MPG 93.93 05/25/2023   No results found for: "PROLACTIN" Lab Results  Component Value Date   CHOL 145 05/25/2023   TRIG 65 05/25/2023   HDL 91 05/25/2023   CHOLHDL 1.6 05/25/2023   VLDL 13 05/25/2023   LDLCALC 41 05/25/2023   Therapeutic Lab Levels:  No results found for: "LITHIUM" No results found for: "VALPROATE" No results found for: "CBMZ"  Physical Findings   AUDIT    Flowsheet Row ED from 05/25/2023 in [facility] Emergency Department at [facility]  Alcohol Use Disorder Identification Test Final Score (AUDIT) 27      PHQ2-9    Flowsheet Row ED from 02/10/2024 in  [facility] ED from 05/25/2023 in [facility] ED from 03/19/2023 in [facility] ED from 02/11/2023 in [facility]  PHQ-2 Total Score 6 6 3 1   PHQ-9 Total Score 24 9 3 4       Flowsheet Row ED from 02/10/2024 in [facility] ED to Hosp-Admission (Discharged) from 09/03/2023 in [facility]LO[facility] ED from 05/25/2023 in [facility]  C-SSRS RISK CATEGORY No Risk No Risk No Risk      Musculoskeletal  Strength & Muscle Tone: within normal limits Gait & Station: normal Patient leans: N/A  Psychiatric Specialty Exam  Presentation  General Appearance:  Appropriate for Environment; Casual  Eye Contact: Good  Speech: Clear and Coherent; Normal Rate  Speech Volume: Normal  Handedness: Right  Mood and Affect  Mood: Anxious; Depressed  Affect: Congruent; Depressed  Thought Process  Thought Processes: Coherent  Descriptions of Associations:Intact  Orientation:Full (Time, Place and Person)  Thought Content:WDL; Logical  Diagnosis of Schizophrenia or Schizoaffective disorder in past: No    Hallucinations:Hallucinations: None (Patient report feeling not being intact with reality)  Ideas of Reference:None  Suicidal Thoughts:Suicidal Thoughts: No  Homicidal Thoughts:Homicidal Thoughts: No  Sensorium  Memory: Immediate Good; Recent Good  Judgment: Fair  Insight: Fair  Chartered certified accountant: Fair  Attention Span: Fair  Recall: [employer] of Knowledge: Fair  Language: Fair  Psychomotor Activity  Psychomotor Activity: Psychomotor Activity: Normal  Assets  Assets: Manufacturing systems engineer; Desire for Improvement; Physical Health; Resilience  Sleep  Sleep: Sleep: Poor Number of Hours of Sleep: 0 (Report poor sleep.  Not being able to sleep last  night)   Nutritional Assessment (For OBS and [facility] admissions only) Has the patient had a weight loss or gain of 10 pounds or more in the last 3 months?: No Has the patient had a decrease in food intake/or appetite?: No Does the patient have dental problems?: No Does the patient have eating habits or behaviors that may be indicators of an eating disorder including binging or inducing vomiting?: No Has the patient recently lost weight without trying?: 0 Has the patient been eating poorly because of a decreased appetite?: 0 Malnutrition Screening Tool Score: 0   Physical Exam  Physical Exam Vitals and nursing note reviewed.  HENT:     Head: Normocephalic.     Nose: Nose normal.     Mouth/Throat:     Mouth: Mucous membranes are moist.     Pharynx: Oropharynx is clear.  Eyes:     Extraocular Movements: Extraocular movements intact.  Cardiovascular:     Rate and Rhythm: Normal rate.     Pulses: Normal pulses.  Pulmonary:     Effort: Pulmonary effort is normal.  Abdominal:     Comments: Deferred  Genitourinary:    Comments: Deferred Musculoskeletal:        General: Normal range of motion.     Cervical back: Normal range of motion.  Skin:    General: Skin is warm.  Neurological:  General: No focal deficit present.     Mental Status: She is alert and oriented to person, place, and time.  Psychiatric:        Mood and Affect: Mood normal.     Comments: Anxious    Review of Systems  Constitutional:  Negative for chills and fever.  HENT:  Negative for sore throat.   Eyes:  Negative for blurred vision.  Respiratory:  Negative for cough, sputum production, shortness of breath and wheezing.   Cardiovascular:  Negative for chest pain and palpitations.  Gastrointestinal:  Positive for diarrhea. Negative for abdominal pain, heartburn, nausea and vomiting.  Genitourinary:  Negative for dysuria, frequency and urgency.  Musculoskeletal:  Negative for falls.  Skin:  Negative for  itching and rash.  Neurological:  Negative for dizziness, tingling, tremors, seizures and headaches.  Endo/Heme/Allergies:        See allergy listing   Blood pressure (!) 145/92, pulse 95, temperature 98.1 F (36.7 C), temperature source Oral, resp. rate 19, SpO2 99%. There is no height or weight on file to calculate BMI.  Treatment Plan Summary: Daily contact with patient to assess and evaluate symptoms and progress in treatment and Medication management  Plan: Medication: --Buspar tablet 15 mg po TID for depression and sleep --Remeron Disintegrating tablet 45 mg po at bedtime for depression and sleep --Zoloft tablet 200 mg po daily for depression and anxiety  --Trazodone tablet 50 mg po at bedtime for sleep --Trazodone tablet 100 mg po PRN at bedtime for insomnia --Amlodipine tablet 10 mg po daily for hypertension    Cecilie Lowers, FNP 02/11/2024 12:22 PM

## 2024-02-11 NOTE — ED Notes (Signed)
 Pt sleeping at present, no distress noted.  Monitoring for safety.

## 2024-02-11 NOTE — ED Notes (Signed)
Pt A&O x 4, sleeping at present, no distress noted, monitoring for safety.

## 2024-02-11 NOTE — Discharge Instructions (Addendum)
 Patient accepted to [facility]

## 2024-02-11 NOTE — ED Notes (Signed)
 Patient A&Ox4. Patient denies SI/HI and AVH. Patient reports increase anxiety and depression. Patient oriented to unit, meal and beverage given. Patient denies any physical complaints when asked. No acute distress noted. Support and encouragement provided. Routine safety checks conducted according to facility protocol. Encouraged patient to notify staff if thoughts of harm toward self or others arise. Patient verbalize understanding and agreement. Will continue to monitor for safety.

## 2024-02-11 NOTE — ED Notes (Signed)
 Pt states she is feeling much better after having the ativan earlier. She is very pleasant and ambulating around. Dinner provided. No concerns at this time.

## 2024-02-11 NOTE — ED Notes (Signed)
 Patient resting quietly in bed with eyes closed. Respirations equal and unlabored, skin warm and dry, NAD. Routine safety checks conducted according to facility protocol. Will continue to monitor for safety.

## 2024-02-11 NOTE — ED Notes (Signed)
 Latest CIWA score 5, per MAR protocol, RN to give atarax 25mg  prn.

## 2024-02-11 NOTE — ED Notes (Signed)
 Pt is currently asleep, resp are even and unlabored. There are no apparent s/sx of distress. No further concerns at this time.

## 2024-02-12 ENCOUNTER — Other Ambulatory Visit: Payer: Self-pay

## 2024-02-12 ENCOUNTER — Inpatient Hospital Stay
Admission: AD | Admit: 2024-02-12 | Discharge: 2024-02-23 | DRG: 885 | Disposition: A | Discharge status: Home / Self Care | Source: Intra-hospital | Attending: Psychiatry | Admitting: Psychiatry

## 2024-02-12 ENCOUNTER — Encounter: Payer: Self-pay | Admitting: Psychiatry

## 2024-02-12 DIAGNOSIS — T7431XA Adult psychological abuse, confirmed, initial encounter: Secondary | ICD-10-CM | POA: Diagnosis present

## 2024-02-12 DIAGNOSIS — F332 Major depressive disorder, recurrent severe without psychotic features: Secondary | ICD-10-CM

## 2024-02-12 DIAGNOSIS — F410 Panic disorder [episodic paroxysmal anxiety] without agoraphobia: Secondary | ICD-10-CM | POA: Diagnosis present

## 2024-02-12 DIAGNOSIS — F1320 Sedative, hypnotic or anxiolytic dependence, uncomplicated: Secondary | ICD-10-CM | POA: Diagnosis present

## 2024-02-12 DIAGNOSIS — F419 Anxiety disorder, unspecified: Secondary | ICD-10-CM | POA: Diagnosis not present

## 2024-02-12 DIAGNOSIS — F411 Generalized anxiety disorder: Secondary | ICD-10-CM | POA: Diagnosis present

## 2024-02-12 DIAGNOSIS — G4700 Insomnia, unspecified: Secondary | ICD-10-CM | POA: Diagnosis present

## 2024-02-12 DIAGNOSIS — F10239 Alcohol dependence with withdrawal, unspecified: Secondary | ICD-10-CM | POA: Diagnosis present

## 2024-02-12 DIAGNOSIS — Z635 Disruption of family by separation and divorce: Secondary | ICD-10-CM

## 2024-02-12 DIAGNOSIS — Z1152 Encounter for screening for COVID-19: Secondary | ICD-10-CM

## 2024-02-12 DIAGNOSIS — F1010 Alcohol abuse, uncomplicated: Secondary | ICD-10-CM | POA: Diagnosis not present

## 2024-02-12 DIAGNOSIS — Z79899 Other long term (current) drug therapy: Secondary | ICD-10-CM

## 2024-02-12 DIAGNOSIS — I10 Essential (primary) hypertension: Secondary | ICD-10-CM | POA: Diagnosis present

## 2024-02-12 DIAGNOSIS — F339 Major depressive disorder, recurrent, unspecified: Secondary | ICD-10-CM | POA: Diagnosis present

## 2024-02-12 DIAGNOSIS — F39 Unspecified mood [affective] disorder: Secondary | ICD-10-CM | POA: Diagnosis not present

## 2024-02-12 LAB — POC SARS CORONAVIRUS 2 AG: SARSCOV2ONAVIRUS 2 AG: NEGATIVE

## 2024-02-12 MED ORDER — ONDANSETRON 4 MG PO TBDP: 4.0000 mg | ORAL_TABLET | Freq: Four times a day (QID) | ORAL | Status: AC | PRN

## 2024-02-12 MED ORDER — THIAMINE MONONITRATE 100 MG PO TABS
100.0000 mg | ORAL_TABLET | Freq: Every day | ORAL | Status: DC
  Administered 2024-02-13 – 2024-02-23 (×11): 100 mg via ORAL
  Filled 2024-02-12 (×11): qty 1

## 2024-02-12 MED ORDER — SERTRALINE HCL 50 MG PO TABS
200.0000 mg | ORAL_TABLET | Freq: Every day | ORAL | Status: DC
  Administered 2024-02-13 – 2024-02-23 (×11): 200 mg via ORAL
  Filled 2024-02-12 (×11): qty 4

## 2024-02-12 MED ORDER — HYDROXYZINE HCL 25 MG PO TABS
25.0000 mg | ORAL_TABLET | Freq: Four times a day (QID) | ORAL | Status: AC | PRN
  Administered 2024-02-12 – 2024-02-13 (×2): 25 mg via ORAL
  Filled 2024-02-12 (×2): qty 1

## 2024-02-12 MED ORDER — ALUM & MAG HYDROXIDE-SIMETH 200-200-20 MG/5ML PO SUSP: 30.0000 mL | ORAL | Status: DC | PRN

## 2024-02-12 MED ORDER — ADULT MULTIVITAMIN W/MINERALS CH
1.0000 | ORAL_TABLET | Freq: Every day | ORAL | Status: DC
  Administered 2024-02-12 – 2024-02-23 (×12): 1 via ORAL
  Filled 2024-02-12 (×12): qty 1

## 2024-02-12 MED ORDER — LOPERAMIDE HCL 2 MG PO CAPS: 2.0000 mg | ORAL_CAPSULE | ORAL | Status: AC | PRN

## 2024-02-12 MED ORDER — LORAZEPAM 1 MG PO TABS
1.0000 mg | ORAL_TABLET | Freq: Four times a day (QID) | ORAL | Status: DC | PRN
  Administered 2024-02-12: 1 mg via ORAL
  Filled 2024-02-12: qty 1

## 2024-02-12 MED ORDER — BUSPIRONE HCL 5 MG PO TABS
15.0000 mg | ORAL_TABLET | Freq: Three times a day (TID) | ORAL | Status: DC
Start: 2024-02-12 — End: 2024-02-23
  Administered 2024-02-12 – 2024-02-23 (×29): 15 mg via ORAL
  Filled 2024-02-12 (×31): qty 3

## 2024-02-12 MED ORDER — MIRTAZAPINE 15 MG PO TBDP
45.0000 mg | ORAL_TABLET | Freq: Every day | ORAL | Status: DC
  Administered 2024-02-12 – 2024-02-22 (×11): 45 mg via ORAL
  Filled 2024-02-12 (×11): qty 3

## 2024-02-12 MED ORDER — MIRTAZAPINE 15 MG PO TBDP
45.0000 mg | ORAL_TABLET | Freq: Every day | ORAL | Status: DC
Start: 2024-02-13 — End: 2024-02-12
  Filled 2024-02-12: qty 3

## 2024-02-12 MED ORDER — AMLODIPINE BESYLATE 5 MG PO TABS
10.0000 mg | ORAL_TABLET | Freq: Every day | ORAL | Status: DC
Start: 2024-02-13 — End: 2024-02-23
  Administered 2024-02-13 – 2024-02-23 (×11): 10 mg via ORAL
  Filled 2024-02-12 (×11): qty 2

## 2024-02-12 MED ORDER — ACETAMINOPHEN 325 MG PO TABS
650.0000 mg | ORAL_TABLET | Freq: Four times a day (QID) | ORAL | Status: DC | PRN
  Administered 2024-02-14: 650 mg via ORAL
  Filled 2024-02-12: qty 2

## 2024-02-12 MED ORDER — TRAZODONE HCL 50 MG PO TABS
150.0000 mg | ORAL_TABLET | Freq: Every evening | ORAL | Status: DC | PRN
  Administered 2024-02-12: 150 mg via ORAL
  Filled 2024-02-12: qty 1

## 2024-02-12 NOTE — ED Notes (Signed)
 Pt very anxious about leaving now. Stating, "I dont want to go somewhere else. Why can't I just stay here?" RN attempted to explain that this is an urgent care & we cannot keep her here. She is still anxious, but is asking to call her daughter. She asks this RN to explain the process to her daughter as well. This RN spoke with both of them. All questions were answered, daughter verbalized understanding. Pt is still anxious, but has went to sit back down on her bed to rest.

## 2024-02-12 NOTE — Tx Team (Signed)
 Initial Treatment Plan 02/12/2024 8:23 PM Stephanie Riley MV[facility]:[id number]    PATIENT STRESSORS: Substance abuse     PATIENT STRENGTHS: Motivation for treatment/growth    PATIENT IDENTIFIED PROBLEMS: Alcohol Abuse                      DISCHARGE CRITERIA:  Ability to meet basic life and health needs Withdrawal symptoms are absent or subacute and managed without 24-hour nursing intervention  PRELIMINARY DISCHARGE PLAN: Attend aftercare/continuing care group  PATIENT/FAMILY INVOLVEMENT: This treatment plan has been presented to and reviewed with the patient, Stephanie Riley.  The patient and family have been given the opportunity to ask questions and make suggestions.  Neysa Bonito, RN 02/12/2024, 8:23 PM

## 2024-02-12 NOTE — Group Note (Signed)
 Date:  02/12/2024 Time:  9:13 PM  Group Topic/Focus:  Healthy Communication:   The focus of this group is to discuss communication, barriers to communication, as well as healthy ways to communicate with others.    Participation Level:  Did Not Attend  Participation Quality:   none  Affect:   none  Cognitive:   none  Insight: None  Engagement in Group:   none  Modes of Intervention:   none  Additional Comments:  none   Kashten Gowin 02/12/2024, 9:13 PM

## 2024-02-12 NOTE — Progress Notes (Addendum)
 [facility] LCSW Progress Note   02/12/2024    10:58 AM  Stephanie Riley   [id number]   Type of Contact and Topic:  Psychiatric Bed Placement   Pt accepted to [facility]      Patient meets inpatient criteria per Hillery Jacks, NP   The attending provider will be Dr. Irwin Brakeman  Call report to [phone]  Jorene Minors, RN @ [facility] notified.     Pt scheduled  to arrive at [facility] TODAY.    Damita Dunnings, MSW, LCSW-A  10:59 AM 02/12/2024

## 2024-02-12 NOTE — ED Notes (Signed)
 RN attempted to call report, [facility] states they do not see the order set yet.

## 2024-02-12 NOTE — ED Notes (Signed)
 Pt leaving now with [facility] transport, ambulatory. Belongings given to [facility]. Vitals stable. Pt denies any pain or concerns, she feels better about going now. All paperwork present in folder. No further concerns.

## 2024-02-12 NOTE — ED Notes (Signed)
 Pt asleep at this time, pt ate lunch earlier. No concerns, no s/sx of distress.

## 2024-02-12 NOTE — ED Provider Notes (Addendum)
 FBC/OBS ASAP Discharge Summary  Date and Time: 02/12/2024 1:27 PM  Name: Stephanie Riley  MRN:  [id number]   Discharge Diagnoses:  Final diagnoses:  Alcohol abuse  Episodic mood disorder (HCC)  Ineffective coping    Subjective: Stephanie Riley accepted to [facility] regional geropsychiatry unit  Stay Summary: Per admisssion assessment note: " Stephanie Riley, 73 y/o female with history of alcohol abuse, GAD, MDD, substance-induced mood disorder, insomnia, adjustment disorder and ADHD, presented to [facility] voluntarily. Per the patient she is having a nervous breakdown because her husband and her was going through a divorce and she found out that he divorced her without she knowing. According to patient they have been together for 50 years. Patient stated that she does see a psychiatrist here at the walk-in clinic. Patient stated she was last hospitalized in October 2024 for alcohol problems. Patient denies having a therapist at this time."   Seen and evaluated face-to-face by this provider.  She continues to endorse increased depression passive suicidal ideation denying plan or intent.  States she has a problem with substance abuse with alcohol and is seeking inpatient admission " and needs to get all of the alcohol out of my system when I am going to make it." uring evaluation Stephanie Riley is resting she is alert/oriented x 4; calm/cooperative; and mood congruent with affect.  Patient is speaking in a clear tone at moderate volume.  Reports headache denies any other withdrawal symptoms.  Stephanie Riley presented with good eye contact.  Her thought process is coherent and relevant; There is no indication that she is currently responding to internal/external stimuli or experiencing delusional thought content.    Patient denies homicidal ideation, psychosis, and paranoia.  Patient has remained calm throughout assessment and has answered questions appropriately.   Total Time spent with patient: 15  minutes  Past Psychiatric History: See chart Past Medical History: See chart Family History: see chart  Family Psychiatric History: see chart Social History: see chart Tobacco Cessation:  N/A, patient does not currently use tobacco products  Current Medications:  Current Facility-Administered Medications  Medication Dose Route Frequency Provider Last Rate Last Admin   amLODipine (NORVASC) tablet 10 mg  10 mg Oral Daily Sindy Guadeloupe, NP   10 mg at 02/12/24 0948   busPIRone (BUSPAR) tablet 15 mg  15 mg Oral TID Sindy Guadeloupe, NP   15 mg at 02/12/24 0948   hydrOXYzine (ATARAX) tablet 25 mg  25 mg Oral Q6H PRN Cecilie Lowers, FNP   25 mg at 02/12/24 0946   LORazepam (ATIVAN) tablet 1 mg  1 mg Oral TID Cecilie Lowers, FNP       Followed by   Melene Muller ON 02/13/2024] LORazepam (ATIVAN) tablet 1 mg  1 mg Oral BID Ntuen, Jesusita Oka, FNP       Followed by   Melene Muller ON 02/15/2024] LORazepam (ATIVAN) tablet 1 mg  1 mg Oral Daily Ntuen, Jesusita Oka, FNP       magnesium hydroxide (MILK OF MAGNESIA) suspension 30 mL  30 mL Oral Daily PRN Sindy Guadeloupe, NP       mirtazapine (REMERON SOL-TAB) disintegrating tablet 45 mg  45 mg Oral QHS Sindy Guadeloupe, NP   45 mg at 02/11/24 2158   OLANZapine (ZYPREXA) injection 10 mg  10 mg Intramuscular TID PRN Sindy Guadeloupe, NP       OLANZapine (ZYPREXA) injection 5 mg  5 mg Intramuscular TID PRN Sindy Guadeloupe, NP       OLANZapine  zydis (ZYPREXA) disintegrating tablet 5 mg  5 mg Oral TID PRN Sindy Guadeloupe, NP       sertraline (ZOLOFT) tablet 200 mg  200 mg Oral Daily Sindy Guadeloupe, NP   200 mg at 02/12/24 0948   traZODone (DESYREL) tablet 100 mg  100 mg Oral QHS PRN Sindy Guadeloupe, NP   100 mg at 02/11/24 0156   traZODone (DESYREL) tablet 50 mg  50 mg Oral Dorthey Sawyer, NP   50 mg at 02/11/24 2157   Current Outpatient Medications  Medication Sig Dispense Refill   amLODipine (NORVASC) 10 MG tablet Take 1 tablet (10 mg total) by mouth daily. 30 tablet 0   B Complex Vitamins (B  COMPLEX PO) Take 1 tablet by mouth daily.     busPIRone (BUSPAR) 15 MG tablet Take 1 tablet (15 mg total) by mouth 3 (three) times daily. Although insurance would like 90 day supply rx, to encourage compliance and appropriate use 30 day supplies are what provider will be writing. (Patient taking differently: Take 15 mg by mouth 3 (three) times daily.) 270 tablet 0   ferrous sulfate 325 (65 FE) MG tablet Take 325 mg by mouth every morning.     hydrOXYzine (VISTARIL) 25 MG capsule Take 1 capsule (25 mg total) by mouth 3 (three) times daily as needed. (Patient taking differently: Take 25 mg by mouth 3 (three) times daily.) 135 capsule 0   mirtazapine (REMERON SOLTAB) 45 MG disintegrating tablet Take 1 tablet (45 mg total) by mouth at bedtime. 90 tablet 0   Multiple Vitamins-Minerals (ADULT GUMMY PO) Take 2 tablets by mouth daily.     naltrexone (DEPADE) 50 MG tablet Take 1 tablet by mouth daily.     sertraline (ZOLOFT) 100 MG tablet Take 2 tablets (200 mg total) by mouth daily. 180 tablet 0   traZODone (DESYREL) 100 MG tablet Take 1 tablet (100 mg total) by mouth at bedtime as needed for sleep. Can take with 150mg  Trazodone tablet. (Patient taking differently: Take 100 mg by mouth at bedtime.) 90 tablet 0   traZODone (DESYREL) 50 MG tablet Take 1 tablet (50 mg total) by mouth at bedtime. Can take with 100mg  trazodone tablet nightly. (Patient taking differently: Take 50 mg by mouth at bedtime as needed for sleep.) 90 tablet 0    PTA Medications:  Facility Ordered Medications  Medication   magnesium hydroxide (MILK OF MAGNESIA) suspension 30 mL   OLANZapine zydis (ZYPREXA) disintegrating tablet 5 mg   OLANZapine (ZYPREXA) injection 5 mg   OLANZapine (ZYPREXA) injection 10 mg   amLODipine (NORVASC) tablet 10 mg   busPIRone (BUSPAR) tablet 15 mg   mirtazapine (REMERON SOL-TAB) disintegrating tablet 45 mg   sertraline (ZOLOFT) tablet 200 mg   traZODone (DESYREL) tablet 100 mg   traZODone (DESYREL)  tablet 50 mg   [COMPLETED] loperamide (IMODIUM) capsule 4 mg   [COMPLETED] thiamine (VITAMIN B1) injection 100 mg   hydrOXYzine (ATARAX) tablet 25 mg   [COMPLETED] LORazepam (ATIVAN) tablet 1 mg   Followed by   LORazepam (ATIVAN) tablet 1 mg   Followed by   Melene Muller ON 02/13/2024] LORazepam (ATIVAN) tablet 1 mg   Followed by   Melene Muller ON 02/15/2024] LORazepam (ATIVAN) tablet 1 mg   PTA Medications  Medication Sig   naltrexone (DEPADE) 50 MG tablet Take 1 tablet by mouth daily.   amLODipine (NORVASC) 10 MG tablet Take 1 tablet (10 mg total) by mouth daily.   busPIRone (BUSPAR) 15 MG tablet Take 1  tablet (15 mg total) by mouth 3 (three) times daily. Although insurance would like 90 day supply rx, to encourage compliance and appropriate use 30 day supplies are what provider will be writing. (Patient taking differently: Take 15 mg by mouth 3 (three) times daily.)   mirtazapine (REMERON SOLTAB) 45 MG disintegrating tablet Take 1 tablet (45 mg total) by mouth at bedtime.   sertraline (ZOLOFT) 100 MG tablet Take 2 tablets (200 mg total) by mouth daily.   traZODone (DESYREL) 50 MG tablet Take 1 tablet (50 mg total) by mouth at bedtime. Can take with 100mg  trazodone tablet nightly. (Patient taking differently: Take 50 mg by mouth at bedtime as needed for sleep.)   traZODone (DESYREL) 100 MG tablet Take 1 tablet (100 mg total) by mouth at bedtime as needed for sleep. Can take with 150mg  Trazodone tablet. (Patient taking differently: Take 100 mg by mouth at bedtime.)   hydrOXYzine (VISTARIL) 25 MG capsule Take 1 capsule (25 mg total) by mouth 3 (three) times daily as needed. (Patient taking differently: Take 25 mg by mouth 3 (three) times daily.)   ferrous sulfate 325 (65 FE) MG tablet Take 325 mg by mouth every morning.   B Complex Vitamins (B COMPLEX PO) Take 1 tablet by mouth daily.   Multiple Vitamins-Minerals (ADULT GUMMY PO) Take 2 tablets by mouth daily.       02/10/2024   10:32 PM 05/28/2023    10:53 AM 05/25/2023    5:15 PM  Depression screen PHQ 2/9  Decreased Interest 3 3 3   Down, Depressed, Hopeless 3 3 3   PHQ - 2 Score 6 6 6   Altered sleeping 3 0 1  Tired, decreased energy 3 0 2  Change in appetite 3 1 1   Feeling bad or failure about yourself  3 1 1   Trouble concentrating 3 1 2   Moving slowly or fidgety/restless 3 0 0  Suicidal thoughts  0 0  PHQ-9 Score 24 9 13   Difficult doing work/chores   Very difficult    Flowsheet Row ED from 02/10/2024 in [facility] ED to Hosp-Admission (Discharged) from 09/03/2023 in [facility] ED from 05/25/2023 in [facility]  C-SSRS RISK CATEGORY No Risk No Risk No Risk       Musculoskeletal  Strength & Muscle Tone: within normal limits Gait & Station: normal Patient leans: N/A  Psychiatric Specialty Exam  Presentation  General Appearance:  Appropriate for Environment; Casual  Eye Contact: Good  Speech: Clear and Coherent; Normal Rate  Speech Volume: Normal  Handedness: Right   Mood and Affect  Mood: Anxious; Depressed  Affect: Congruent; Depressed   Thought Process  Thought Processes: Coherent  Descriptions of Associations:Intact  Orientation:Full (Time, Place and Person)  Thought Content:WDL; Logical  Diagnosis of Schizophrenia or Schizoaffective disorder in past: No    Hallucinations:Hallucinations: None (Patient report feeling not being intact with reality)  Ideas of Reference:None  Suicidal Thoughts:Suicidal Thoughts: No  Homicidal Thoughts:Homicidal Thoughts: No   Sensorium  Memory: Immediate Good; Recent Good  Judgment: Fair  Insight: Fair   Chartered certified accountant: Fair  Attention Span: Fair  Recall: [employer] of Knowledge: Fair  Language: Fair   Psychomotor Activity  Psychomotor Activity: Psychomotor Activity: Normal   Assets  Assets: Investment banker, corporate; Desire for Improvement; Physical Health; Resilience   Sleep  Sleep: Sleep: Poor Number of Hours of Sleep: 0 (Report poor sleep.  Not being able  to sleep last night)   Nutritional Assessment (For OBS and FBC admissions only) Has the patient had a weight loss or gain of 10 pounds or more in the last 3 months?: No Has the patient had a decrease in food intake/or appetite?: No Does the patient have dental problems?: No Does the patient have eating habits or behaviors that may be indicators of an eating disorder including binging or inducing vomiting?: No Has the patient recently lost weight without trying?: 0 Has the patient been eating poorly because of a decreased appetite?: 0 Malnutrition Screening Tool Score: 0    Physical Exam  Physical Exam Vitals and nursing note reviewed.  Neurological:     Mental Status: She is oriented to person, place, and time.  Psychiatric:        Mood and Affect: Mood normal.        Behavior: Behavior normal.        Thought Content: Thought content normal.    Review of Systems  Psychiatric/Behavioral:  Positive for substance abuse. The patient is nervous/anxious and has insomnia.   All other systems reviewed and are negative.  Blood pressure (!) 131/94, pulse (!) 102, temperature 98.6 F (37 C), temperature source Oral, resp. rate 18, SpO2 98%. There is no height or weight on file to calculate BMI.  Demographic Factors:  Age 56 or older  Loss Factors: Financial problems/change in socioeconomic status  Historical Factors: Family history of mental illness or substance abuse  Risk Reduction Factors:   NA  Continued Clinical Symptoms:  Alcohol/Substance Abuse/Dependencies  Cognitive Features That Contribute To Risk:  Closed-mindedness    Suicide Risk:  Mild:  Suicidal ideation of limited frequency, intensity, duration, and specificity.  There are no identifiable plans, no associated intent, mild dysphoria and related symptoms,  good self-control (both objective and subjective assessment), few other risk factors, and identifiable protective factors, including available and accessible social support.  Plan Of Care/Follow-up recommendations:  Activity:  as tolerated Diet:  heart healthy   Disposition: Expected to [facility] [facility].    Oneta Rack, NP 02/12/2024, 1:27 PM

## 2024-02-12 NOTE — ED Notes (Signed)
 Pt is currently asleep, resp are even and unlabored. There are no apparent s/sx of distress. No further concerns at this time.
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
## 2024-02-12 NOTE — ED Notes (Signed)
 Pt sleeping at present, no distress noted.  Monitoring for safety.

## 2024-02-12 NOTE — ED Notes (Signed)
 Report called to [facility]. This RN called and set up SAFE transport.

## 2024-02-12 NOTE — Plan of Care (Signed)
 New Admission  Problem: Education: Goal: Utilization of techniques to improve thought processes will improve Outcome: Progressing Goal: Knowledge of the prescribed therapeutic regimen will improve Outcome: Progressing   Problem: Activity: Goal: Interest or engagement in leisure activities will improve Outcome: Progressing Goal: Imbalance in normal sleep/wake cycle will improve Outcome: Progressing

## 2024-02-13 DIAGNOSIS — F339 Major depressive disorder, recurrent, unspecified: Secondary | ICD-10-CM | POA: Diagnosis not present

## 2024-02-13 MED ORDER — LORAZEPAM 0.5 MG PO TABS
0.5000 mg | ORAL_TABLET | Freq: Three times a day (TID) | ORAL | Status: DC | PRN
Start: 2024-02-13 — End: 2024-02-13

## 2024-02-13 MED ORDER — CHLORDIAZEPOXIDE HCL 25 MG PO CAPS
25.0000 mg | ORAL_CAPSULE | Freq: Three times a day (TID) | ORAL | Status: AC
  Administered 2024-02-14 – 2024-02-15 (×3): 25 mg via ORAL
  Filled 2024-02-13 (×3): qty 1

## 2024-02-13 MED ORDER — TRAZODONE HCL 50 MG PO TABS
50.0000 mg | ORAL_TABLET | Freq: Every evening | ORAL | Status: DC | PRN
  Administered 2024-02-14 – 2024-02-23 (×7): 50 mg via ORAL
  Filled 2024-02-13 (×7): qty 1

## 2024-02-13 MED ORDER — CHLORDIAZEPOXIDE HCL 25 MG PO CAPS
25.0000 mg | ORAL_CAPSULE | Freq: Once | ORAL | Status: AC
  Administered 2024-02-13: 25 mg via ORAL
  Filled 2024-02-13: qty 1

## 2024-02-13 MED ORDER — CHLORDIAZEPOXIDE HCL 25 MG PO CAPS
25.0000 mg | ORAL_CAPSULE | Freq: Every day | ORAL | Status: AC
  Administered 2024-02-16: 25 mg via ORAL
  Filled 2024-02-13: qty 1

## 2024-02-13 MED ORDER — TRAZODONE HCL 100 MG PO TABS
100.0000 mg | ORAL_TABLET | Freq: Every day | ORAL | Status: DC
  Administered 2024-02-13 – 2024-02-22 (×10): 100 mg via ORAL
  Filled 2024-02-13 (×11): qty 1

## 2024-02-13 MED ORDER — CHLORDIAZEPOXIDE HCL 25 MG PO CAPS
25.0000 mg | ORAL_CAPSULE | ORAL | Status: AC
  Administered 2024-02-15 – 2024-02-16 (×2): 25 mg via ORAL
  Filled 2024-02-13 (×2): qty 1

## 2024-02-13 MED ORDER — CHLORDIAZEPOXIDE HCL 25 MG PO CAPS
25.0000 mg | ORAL_CAPSULE | Freq: Four times a day (QID) | ORAL | Status: AC
  Administered 2024-02-13 – 2024-02-14 (×4): 25 mg via ORAL
  Filled 2024-02-13 (×4): qty 1

## 2024-02-13 NOTE — BHH Group Notes (Signed)
 LCSW Wellness Group Note   02/13/2024 1:00pm  Type of Group and Topic: Psychoeducational Group:  Wellness  Participation Level:  did not attend  Description of Group  Wellness group introduces the topic and its focus on developing healthy habits across the spectrum and its relationship to a decrease in hospital admissions.  Six areas of wellness are discussed: physical, social spiritual, intellectual, occupational, and emotional.  Patients are asked to consider their current wellness habits and to identify areas of wellness where they are interested and able to focus on improvements.    Therapeutic Goals Patients will understand components of wellness and how they can positively impact overall health.  Patients will identify areas of wellness where they have developed good habits. Patients will identify areas of wellness where they would like to make improvements.    Summary of Patient Progress     Therapeutic Modalities: Cognitive Behavioral Therapy Psychoeducation    Lorri Frederick, LCSW

## 2024-02-13 NOTE — Plan of Care (Signed)
  Problem: Self-Concept: Goal: Will verbalize positive feelings about self Outcome: Not Progressing Goal: Level of anxiety will decrease Outcome: Not Progressing
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
## 2024-02-13 NOTE — Group Note (Signed)
 Date:  02/13/2024 Time:  10:37 PM  Group Topic/Focus:  Wrap-Up Group:   The focus of this group is to help patients review their daily goal of treatment and discuss progress on daily workbooks.    Participation Level:  Active  Participation Quality:  Appropriate  Affect:  Appropriate  Cognitive:  Appropriate  Insight: Appropriate  Engagement in Group:  Engaged  Modes of Intervention:  Discussion  Additional Comments:    Maeola Harman 02/13/2024, 10:37 PM

## 2024-02-13 NOTE — Progress Notes (Signed)
   02/13/24 1100  Psych Admission Type (Psych Patients Only)  Admission Status Voluntary  Psychosocial Assessment  Patient Complaints Anxiety  Eye Contact Fair  Facial Expression Anxious  Affect Appropriate to circumstance  Speech Logical/coherent  Interaction Assertive  Motor Activity Slow  Appearance/Hygiene In scrubs  Behavior Characteristics Appropriate to situation  Mood Anxious  Thought Process  Coherency WDL  Content WDL  Delusions None reported or observed  Perception WDL  Hallucination None reported or observed  Judgment WDL  Confusion None  Danger to Self  Current suicidal ideation? Denies  Agreement Not to Harm Self Yes  Description of Agreement verbal  Danger to Others  Danger to Others Reported or observed  Danger to Others Abnormal  Harmful Behavior to others No threats or harm toward other people
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
## 2024-02-13 NOTE — [facility] Suicide Risk Assessment (Signed)
 [facility] Admission Suicide Risk Assessment   Nursing information obtained from:    Demographic factors:  Caucasian, Age 74 or older Current Mental Status:  NA Loss Factors:  NA Historical Factors:  NA Risk Reduction Factors:  NA  Total Time spent with patient: 30 minutes Principal Problem: MDD (major depressive disorder), recurrent episode (HCC) Diagnosis:  Principal Problem:   MDD (major depressive disorder), recurrent episode (HCC)  Subjective Data: Stephanie Riley, 73 y/o female with history of alcohol abuse, GAD, MDD, substance-induced mood disorder, insomnia, adjustment disorder and ADHD, presented to [facility] voluntarily. Per the patient she is having a nervous breakdown because her husband and her was going through a divorce and she found out that he divorced her without she knowing. According to patient they have been together for 50 years. Patient stated that she does see a psychiatrist here at the walk-in clinic.   Continued Clinical Symptoms:  Alcohol Use Disorder Identification Test Final Score (AUDIT): 9 The "Alcohol Use Disorders Identification Test", Guidelines for Use in Primary Care, Second Edition.  World Science writer [facility]). Score between 0-7:  no or low risk or alcohol related problems. Score between 8-15:  moderate risk of alcohol related problems. Score between 16-19:  high risk of alcohol related problems. Score 20 or above:  warrants further diagnostic evaluation for alcohol dependence and treatment.   CLINICAL FACTORS:   Severe Anxiety and/or Agitation Alcohol/Substance Abuse/Dependencies   Musculoskeletal: Strength & Muscle Tone: within normal limits Gait & Station: normal Patient leans: N/A  Psychiatric Specialty Exam:  Presentation  General Appearance:  Appropriate for Environment; Casual  Eye Contact: Fair  Speech: Clear and Coherent  Speech Volume: Normal  Handedness: Right   Mood and Affect   Mood: Anxious  Affect: Appropriate   Thought Process  Thought Processes: Coherent  Descriptions of Associations:Intact  Orientation:Full (Time, Place and Person)  Thought Content:Illogical  History of Schizophrenia/Schizoaffective disorder:No  Duration of Psychotic Symptoms:No data recorded Hallucinations:Hallucinations: None  Ideas of Reference:None  Suicidal Thoughts:Suicidal Thoughts: No  Homicidal Thoughts:Homicidal Thoughts: No   Sensorium  Memory: Immediate Fair; Recent Fair; Remote Fair  Judgment: Impaired  Insight: Shallow   Executive Functions  Concentration: Fair  Attention Span: Fair  Recall: [employer] of Knowledge: Fair  Language: Fair   Psychomotor Activity  Psychomotor Activity: Psychomotor Activity: Normal   Assets  Assets: Communication Skills; Desire for Improvement; Resilience   Sleep  Sleep: Sleep: Fair    Physical Exam: Physical Exam ROS Blood pressure (!) 141/86, pulse 89, temperature 98.3 F (36.8 C), height 5\' 6"  (1.676 m), weight 68.9 kg, SpO2 98%. Body mass index is 24.53 kg/m.   COGNITIVE FEATURES THAT CONTRIBUTE TO RISK:  Thought constriction (tunnel vision)    SUICIDE RISK:   Minimal: No identifiable suicidal ideation.  Patients presenting with no risk factors but with morbid ruminations; may be classified as minimal risk based on the severity of the depressive symptoms  PLAN OF CARE: Patient is currently admitted to the geropsych inpatient unit with every 15 minute safety monitoring.  Multidisciplinary treatment approach is offered.  Medication management, group/milieu therapy is offered  I certify that inpatient services furnished can reasonably be expected to improve the patient's condition.   Verner Chol, MD 02/13/2024, 1:28 PM

## 2024-02-14 DIAGNOSIS — F332 Major depressive disorder, recurrent severe without psychotic features: Secondary | ICD-10-CM

## 2024-02-14 MED ORDER — ARIPIPRAZOLE 5 MG PO TABS
5.0000 mg | ORAL_TABLET | Freq: Every day | ORAL | Status: DC
  Administered 2024-02-14 – 2024-02-22 (×9): 5 mg via ORAL
  Filled 2024-02-14 (×10): qty 1

## 2024-02-14 MED ORDER — DOXEPIN HCL 50 MG PO CAPS
50.0000 mg | ORAL_CAPSULE | Freq: Every day | ORAL | Status: DC
  Administered 2024-02-14 – 2024-02-22 (×9): 50 mg via ORAL
  Filled 2024-02-14 (×9): qty 1

## 2024-02-14 NOTE — Group Note (Signed)
 Recreation Therapy Group Note   Group Topic:Health and Wellness  Group Date: 02/14/2024 Start Time: 1400 End Time: 1450 Facilitators: Rosina Lowenstein, LRT, CTRS Location:  Dayroom  Activity Description/Intervention: Therapeutic Drumming. Patients with peers and staff were given the opportunity to engage in a leader facilitated [facility] Group Empowerment Drumming Circle with staff from the [employer], in partnership with [employer]. Teaching laboratory technician and trained [employer], Theodoro Doing leading with LRT observing and documenting intervention and pt response. This evidenced-based practice targets 7 areas of health and wellbeing in the human experience including: stress-reduction, exercise, self-expression, camaraderie/support, nurturing, spirituality, and music-making (leisure).    Goal Area(s) Addresses:  Patient will engage in pro-social way in music group.  Patient will follow directions of drum leader on the first prompt. Patient will demonstrate no behavioral issues during group.  Patient will identify if a reduction in stress level occurs as a result of participation in therapeutic drum circle.     Affect/Mood: N/A   Participation Level: Did not attend    Clinical Observations/Individualized Feedback: Patient did not attend group.   Plan: Continue to engage patient in RT group sessions 2-3x/week.   [address], LRT, CTRS 02/14/2024 5:10 PM

## 2024-02-14 NOTE — BH IP Treatment Plan (Signed)
 Interdisciplinary Treatment and Diagnostic Plan Update  02/14/2024 Time of Session: 12:19 AM  Stephanie Riley MRN: [id number]  Principal Diagnosis: MDD (major depressive disorder), recurrent episode (HCC)  Secondary Diagnoses: Principal Problem:   MDD (major depressive disorder), recurrent episode (HCC)   Current Medications:  Current Facility-Administered Medications  Medication Dose Route Frequency Provider Last Rate Last Admin   acetaminophen (TYLENOL) tablet 650 mg  650 mg Oral Q6H PRN Oneta Rack, NP       alum & mag hydroxide-simeth (MAALOX/MYLANTA) 200-200-20 MG/5ML suspension 30 mL  30 mL Oral Q4H PRN Oneta Rack, NP       amLODipine (NORVASC) tablet 10 mg  10 mg Oral Daily Oneta Rack, NP   10 mg at 02/14/24 0857   busPIRone (BUSPAR) tablet 15 mg  15 mg Oral TID Oneta Rack, NP   15 mg at 02/14/24 0857   chlordiazePOXIDE (LIBRIUM) capsule 25 mg  25 mg Oral QID Verner Chol, MD   25 mg at 02/14/24 7829   Followed by   chlordiazePOXIDE (LIBRIUM) capsule 25 mg  25 mg Oral TID Verner Chol, MD       Followed by   Melene Muller ON 02/15/2024] chlordiazePOXIDE (LIBRIUM) capsule 25 mg  25 mg Oral Milana Huntsman, MD       Followed by   Melene Muller ON 02/16/2024] chlordiazePOXIDE (LIBRIUM) capsule 25 mg  25 mg Oral Daily Verner Chol, MD       hydrOXYzine (ATARAX) tablet 25 mg  25 mg Oral Q6H PRN Oneta Rack, NP   25 mg at 02/13/24 1133   loperamide (IMODIUM) capsule 2-4 mg  2-4 mg Oral PRN Oneta Rack, NP       mirtazapine (REMERON SOL-TAB) disintegrating tablet 45 mg  45 mg Oral QHS Bobbitt, Shalon E, NP   45 mg at 02/13/24 2116   multivitamin with minerals tablet 1 tablet  1 tablet Oral Daily Oneta Rack, NP   1 tablet at 02/14/24 0857   ondansetron (ZOFRAN-ODT) disintegrating tablet 4 mg  4 mg Oral Q6H PRN Oneta Rack, NP       sertraline (ZOLOFT) tablet 200 mg  200 mg Oral Daily Oneta Rack, NP   200 mg at 02/14/24 0857   thiamine (VITAMIN  B1) tablet 100 mg  100 mg Oral Daily Oneta Rack, NP   100 mg at 02/14/24 0857   traZODone (DESYREL) tablet 100 mg  100 mg Oral QHS Verner Chol, MD   100 mg at 02/13/24 2139   traZODone (DESYREL) tablet 50 mg  50 mg Oral QHS PRN Verner Chol, MD   50 mg at 02/14/24 0110   PTA Medications: Medications Prior to Admission  Medication Sig Dispense Refill Last Dose/Taking   amLODipine (NORVASC) 10 MG tablet Take 1 tablet (10 mg total) by mouth daily. 30 tablet 0 02/12/2024   B Complex Vitamins (B COMPLEX PO) Take 1 tablet by mouth daily.   02/12/2024   busPIRone (BUSPAR) 15 MG tablet Take 1 tablet (15 mg total) by mouth 3 (three) times daily. Although insurance would like 90 day supply rx, to encourage compliance and appropriate use 30 day supplies are what provider will be writing. (Patient taking differently: Take 15 mg by mouth 3 (three) times daily.) 270 tablet 0 02/12/2024   mirtazapine (REMERON SOLTAB) 45 MG disintegrating tablet Take 1 tablet (45 mg total) by mouth at bedtime. 90 tablet 0 02/12/2024   sertraline (ZOLOFT) 100 MG tablet Take  2 tablets (200 mg total) by mouth daily. 180 tablet 0 02/12/2024   ferrous sulfate 325 (65 FE) MG tablet Take 325 mg by mouth every morning.      hydrOXYzine (VISTARIL) 25 MG capsule Take 1 capsule (25 mg total) by mouth 3 (three) times daily as needed. (Patient taking differently: Take 25 mg by mouth 3 (three) times daily.) 135 capsule 0    Multiple Vitamins-Minerals (ADULT GUMMY PO) Take 2 tablets by mouth daily.      naltrexone (DEPADE) 50 MG tablet Take 1 tablet by mouth daily.      traZODone (DESYREL) 100 MG tablet Take 1 tablet (100 mg total) by mouth at bedtime as needed for sleep. Can take with 150mg  Trazodone tablet. (Patient taking differently: Take 100 mg by mouth at bedtime.) 90 tablet 0    traZODone (DESYREL) 50 MG tablet Take 1 tablet (50 mg total) by mouth at bedtime. Can take with 100mg  trazodone tablet nightly. (Patient taking differently: Take  50 mg by mouth at bedtime as needed for sleep.) 90 tablet 0     Patient Stressors: Substance abuse    Patient Strengths: Motivation for treatment/growth   Treatment Modalities: Medication Management, Group therapy, Case management,  1 to 1 session with clinician, Psychoeducation, Recreational therapy.   Physician Treatment Plan for Primary Diagnosis: MDD (major depressive disorder), recurrent episode (HCC) Long Term Goal(s): Improvement in symptoms so as ready for discharge   Short Term Goals: Ability to identify changes in lifestyle to reduce recurrence of condition will improve Ability to verbalize feelings will improve Ability to disclose and discuss suicidal ideas Ability to demonstrate self-control will improve Ability to identify and develop effective coping behaviors will improve Ability to maintain clinical measurements within normal limits will improve Compliance with prescribed medications will improve Ability to identify triggers associated with substance abuse/mental health issues will improve  Medication Management: Evaluate patient's response, side effects, and tolerance of medication regimen.  Therapeutic Interventions: 1 to 1 sessions, Unit Group sessions and Medication administration.  Evaluation of Outcomes: Not Progressing  Physician Treatment Plan for Secondary Diagnosis: Principal Problem:   MDD (major depressive disorder), recurrent episode (HCC)  Long Term Goal(s): Improvement in symptoms so as ready for discharge   Short Term Goals: Ability to identify changes in lifestyle to reduce recurrence of condition will improve Ability to verbalize feelings will improve Ability to disclose and discuss suicidal ideas Ability to demonstrate self-control will improve Ability to identify and develop effective coping behaviors will improve Ability to maintain clinical measurements within normal limits will improve Compliance with prescribed medications will  improve Ability to identify triggers associated with substance abuse/mental health issues will improve     Medication Management: Evaluate patient's response, side effects, and tolerance of medication regimen.  Therapeutic Interventions: 1 to 1 sessions, Unit Group sessions and Medication administration.  Evaluation of Outcomes: Not Progressing   RN Treatment Plan for Primary Diagnosis: MDD (major depressive disorder), recurrent episode (HCC) Long Term Goal(s): Knowledge of disease and therapeutic regimen to maintain health will improve  Short Term Goals: Ability to remain free from injury will improve, Ability to verbalize frustration and anger appropriately will improve, Ability to demonstrate self-control, Ability to participate in decision making will improve, Ability to verbalize feelings will improve, Ability to disclose and discuss suicidal ideas, Ability to identify and develop effective coping behaviors will improve, and Compliance with prescribed medications will improve  Medication Management: RN will administer medications as ordered by provider, will assess and evaluate  patient's response and provide education to patient for prescribed medication. RN will report any adverse and/or side effects to prescribing provider.  Therapeutic Interventions: 1 on 1 counseling sessions, Psychoeducation, Medication administration, Evaluate responses to treatment, Monitor vital signs and CBGs as ordered, Perform/monitor CIWA, COWS, AIMS and Fall Risk screenings as ordered, Perform wound care treatments as ordered.  Evaluation of Outcomes: Not Progressing   LCSW Treatment Plan for Primary Diagnosis: MDD (major depressive disorder), recurrent episode (HCC) Long Term Goal(s): Safe transition to appropriate next level of care at discharge, Engage patient in therapeutic group addressing interpersonal concerns.  Short Term Goals: Engage patient in aftercare planning with referrals and resources,  Increase social support, Increase ability to appropriately verbalize feelings, Increase emotional regulation, Facilitate acceptance of mental health diagnosis and concerns, Facilitate patient progression through stages of change regarding substance use diagnoses and concerns, Identify triggers associated with mental health/substance abuse issues, and Increase skills for wellness and recovery  Therapeutic Interventions: Assess for all discharge needs, 1 to 1 time with Social worker, Explore available resources and support systems, Assess for adequacy in community support network, Educate family and significant other(s) on suicide prevention, Complete Psychosocial Assessment, Interpersonal group therapy.  Evaluation of Outcomes: Not Progressing   Progress in Treatment: Attending groups: Yes. and No. Participating in groups: Yes. and No. Taking medication as prescribed: Yes. Toleration medication: Yes. Family/Significant other contact made: No, will contact:  CSW will contact if given permission  Patient understands diagnosis: Yes. Discussing patient identified problems/goals with staff: Yes. Medical problems stabilized or resolved: Yes. Denies suicidal/homicidal ideation: Yes. Issues/concerns per patient self-inventory: No. Other: None   New problem(s) identified: No, Describe:  None identified  New Short Term/Long Term Goal(s): elimination of symptoms of psychosis, medication management for mood stabilization; elimination of SI thoughts; development of comprehensive mental wellness plan.   Patient Goals:  " I think I need more help than this. I'm severely depressed, past anxious. I just need some medicine that will take this feeling away"   Discharge Plan or Barriers: CSW will assist with appropriate discharge planning   Reason for Continuation of Hospitalization: Anxiety Depression Medication stabilization  Estimated Length of Stay: 1 to 7 days   Last 3 [place] Suicide Severity  Risk Score: Flowsheet Row Admission (Current) from 02/12/2024 in [facility] BE[facility] ED from 02/10/2024 in [facility] ED to Hosp-Admission (Discharged) from 09/03/2023 in [place] LONG 4TH FLOOR PROGRESSIVE CARE AND UROLOGY  C-SSRS RISK CATEGORY No Risk No Risk No Risk       Last PHQ 2/9 Scores:    02/10/2024   10:32 PM 05/28/2023   10:53 AM 05/25/2023    5:15 PM  Depression screen PHQ 2/9  Decreased Interest 3 3 3   Down, Depressed, Hopeless 3 3 3   PHQ - 2 Score 6 6 6   Altered sleeping 3 0 1  Tired, decreased energy 3 0 2  Change in appetite 3 1 1   Feeling bad or failure about yourself  3 1 1   Trouble concentrating 3 1 2   Moving slowly or fidgety/restless 3 0 0  Suicidal thoughts  0 0  PHQ-9 Score 24 9 13   Difficult doing work/chores   Very difficult    Scribe for Treatment Team: Elza Rafter, LCSWA 02/14/2024 1:30 PM

## 2024-02-14 NOTE — Plan of Care (Signed)
  Problem: Education: Goal: Utilization of techniques to improve thought processes will improve Outcome: Not Progressing Goal: Knowledge of the prescribed therapeutic regimen will improve Outcome: Not Progressing

## 2024-02-14 NOTE — Progress Notes (Signed)
 [facility] MD Progress Note  02/14/2024 9:33 AM Stephanie Riley  MRN:  [id number]  Stephanie Riley, 73 y/o female with history of alcohol abuse, GAD, MDD, substance-induced mood disorder, insomnia, adjustment disorder and ADHD, presented to [facility] voluntarily. Per the patient she is having a nervous breakdown because her husband and her was going through a divorce and she found out that he divorced her without she knowing. According to patient they have been together for 50 years. Patient stated that she does see a psychiatrist here at the walk-in clinic.  Subjective:  Chart reviewed, case discussed in multidisciplinary meeting, patient seen during rounds.  Was met with the treatment team.  Patient reports that she wants to get help with her depression and anxiety that she reports going into psychosis but unable to give clear details as she consistently denies auditory/visual hallucinations and is not displaying any delusions or paranoia.  She consistently denies suicidal/homicidal ideation/plan.  She remains very focused on having Ativan being the only medication that could help her with her anxiety and spite of being educated multiple times by this provider about patient being on Librium which is also a benzo to help her with the detox.  Provider educated her on benzos being not safe in geriatric age as it precipitates delirium and dementia.  Provider also shared her family concerns about her extensive history of benzo abuse and alcohol use.  Patient continues to display lack of insight into her addiction to benzos and alcohol and reports that she needs to be on Ativan for rest of her life.  Provider discussed clear guidelines of treatment where Abilify will be added to help with her depression as an adjunct to her Zoloft 200 mg and doxepin at night to help with her sleep.  Patient agreed with the plan.   Sleep: Fair  Appetite:  Fair  Past Psychiatric History: see h&P Family History: History reviewed. No  pertinent family history. Social History:  Social History   Substance and Sexual Activity  Alcohol Use Yes     Social History   Substance and Sexual Activity  Drug Use Never    Social History   Socioeconomic History   Marital status: Married    Spouse name: Not on file   Number of children: Not on file   Years of education: Not on file   Highest education level: Not on file  Occupational History   Not on file  Tobacco Use   Smoking status: Never   Smokeless tobacco: Never  Substance and Sexual Activity   Alcohol use: Yes   Drug use: Never   Sexual activity: Not Currently  Other Topics Concern   Not on file  Social History Narrative   Not on file   Social Drivers of Health   Financial Resource Strain: Low Risk  (10/19/2022)   Received from [facility], [facility]   Overall Financial Resource Strain (CARDIA)    Difficulty of Paying Living Expenses: Not hard at all  Food Insecurity: No Food Insecurity (02/12/2024)   Hunger Vital Sign    Worried About Running Out of Food in the Last Year: Never true    Ran Out of Food in the Last Year: Never true  Transportation Needs: No Transportation Needs (02/12/2024)   PRAPARE - Administrator, Civil Service (Medical): No    Lack of Transportation (Non-Medical): No  Physical Activity: Insufficiently Active (10/19/2022)   Received from [facility], [facility]   Exercise Vital Sign  Days of Exercise per Week: 3 days    Minutes of Exercise per Session: 20 min  Stress: No Stress Concern Present (10/19/2022)   Received from [facility], [facility] of Occupational Health - Occupational Stress Questionnaire    Feeling of Stress : Not at all  Social Connections: Moderately Isolated (02/12/2024)   Social Connection and Isolation Panel [NHANES]    Frequency of Communication with Friends and Family: Once a week    Frequency of Social Gatherings with Friends and Family: Once a week    Attends  Religious Services: 1 to 4 times per year    Active Member of [employer] or Organizations: No    Attends Engineer, structural: 1 to 4 times per year    Marital Status: Divorced   Past Medical History:  Past Medical History:  Diagnosis Date   Alcohol abuse    Benzodiazepine withdrawal without complication (HCC) 02/12/2023   HTN (hypertension)    History reviewed. No pertinent surgical history.  Current Medications: Current Facility-Administered Medications  Medication Dose Route Frequency Provider Last Rate Last Admin   acetaminophen (TYLENOL) tablet 650 mg  650 mg Oral Q6H PRN Oneta Rack, NP       alum & mag hydroxide-simeth (MAALOX/MYLANTA) 200-200-20 MG/5ML suspension 30 mL  30 mL Oral Q4H PRN Oneta Rack, NP       amLODipine (NORVASC) tablet 10 mg  10 mg Oral Daily Oneta Rack, NP   10 mg at 02/14/24 0857   busPIRone (BUSPAR) tablet 15 mg  15 mg Oral TID Oneta Rack, NP   15 mg at 02/14/24 0857   chlordiazePOXIDE (LIBRIUM) capsule 25 mg  25 mg Oral QID Verner Chol, MD   25 mg at 02/14/24 4098   Followed by   chlordiazePOXIDE (LIBRIUM) capsule 25 mg  25 mg Oral TID Verner Chol, MD       Followed by   Melene Muller ON 02/15/2024] chlordiazePOXIDE (LIBRIUM) capsule 25 mg  25 mg Oral Milana Huntsman, MD       Followed by   Melene Muller ON 02/16/2024] chlordiazePOXIDE (LIBRIUM) capsule 25 mg  25 mg Oral Daily Verner Chol, MD       hydrOXYzine (ATARAX) tablet 25 mg  25 mg Oral Q6H PRN Oneta Rack, NP   25 mg at 02/13/24 1133   loperamide (IMODIUM) capsule 2-4 mg  2-4 mg Oral PRN Oneta Rack, NP       mirtazapine (REMERON SOL-TAB) disintegrating tablet 45 mg  45 mg Oral QHS Bobbitt, Shalon E, NP   45 mg at 02/13/24 2116   multivitamin with minerals tablet 1 tablet  1 tablet Oral Daily Oneta Rack, NP   1 tablet at 02/14/24 0857   ondansetron (ZOFRAN-ODT) disintegrating tablet 4 mg  4 mg Oral Q6H PRN Oneta Rack, NP       sertraline (ZOLOFT)  tablet 200 mg  200 mg Oral Daily Oneta Rack, NP   200 mg at 02/14/24 0857   thiamine (VITAMIN B1) tablet 100 mg  100 mg Oral Daily Oneta Rack, NP   100 mg at 02/14/24 0857   traZODone (DESYREL) tablet 100 mg  100 mg Oral QHS Verner Chol, MD   100 mg at 02/13/24 2139   traZODone (DESYREL) tablet 50 mg  50 mg Oral QHS PRN Verner Chol, MD   50 mg at 02/14/24 0110    Lab Results:  Results for orders placed or  performed during the hospital encounter of 02/10/24 (from the past 48 hours)  POC SARS Coronavirus 2 Ag     Status: None   Collection Time: 02/12/24  3:21 PM  Result Value Ref Range   SARSCOV2ONAVIRUS 2 AG NEGATIVE NEGATIVE    Comment: (NOTE) SARS-CoV-2 antigen NOT DETECTED.   Negative results are presumptive.  Negative results do not preclude SARS-CoV-2 infection and should not be used as the sole basis for treatment or other patient management decisions, including infection  control decisions, particularly in the presence of clinical signs and  symptoms consistent with COVID-19, or in those who have been in contact with the virus.  Negative results must be combined with clinical observations, patient history, and epidemiological information. The expected result is Negative.  Fact Sheet for Patients: https://www.jennings-kim.com/  Fact Sheet for Healthcare Providers: https://alexander-rogers.biz/  This test is not yet approved or cleared by the [place] FDA and  has been authorized for detection and/or diagnosis of SARS-CoV-2 by FDA under an Emergency Use Authorization (EUA).  This EUA will remain in effect (meaning this test can be used) for the duration of  the COV ID-19 declaration under Section 564(b)(1) of the Act, 21 U.S.C. section 360bbb-3(b)(1), unless the authorization is terminated or revoked sooner.      Blood Alcohol level:  Lab Results  Component Value Date   ETH <10 02/10/2024   ETH 215 (H) 09/03/2023     Metabolic Disorder Labs: Lab Results  Component Value Date   HGBA1C 4.9 05/25/2023   MPG 93.93 05/25/2023   No results found for: "PROLACTIN" Lab Results  Component Value Date   CHOL 145 05/25/2023   TRIG 65 05/25/2023   HDL 91 05/25/2023   CHOLHDL 1.6 05/25/2023   VLDL 13 05/25/2023   LDLCALC 41 05/25/2023    Physical Findings: AIMS:  , ,  ,  ,    CIWA:  CIWA-Ar Total: 0 COWS:      Psychiatric Specialty Exam:  Presentation  General Appearance:  Appropriate for Environment; Casual  Eye Contact: Fair  Speech: Clear and Coherent  Speech Volume: Normal    Mood and Affect  Mood: Anxious  Affect: Depressed   Thought Process  Thought Processes: Coherent  Descriptions of Associations:Intact  Orientation:Full (Time, Place and Person)  Thought Content:Illogical  Hallucinations:Hallucinations: None  Ideas of Reference:None  Suicidal Thoughts:Suicidal Thoughts: No  Homicidal Thoughts:Homicidal Thoughts: No   Sensorium  Memory: Immediate Fair; Recent Fair; Remote Fair  Judgment: Impaired  Insight: Shallow   Executive Functions  Concentration: Fair  Attention Span: Fair  Recall: [employer] of Knowledge: Fair  Language: Fair   Psychomotor Activity  Psychomotor Activity: Psychomotor Activity: Normal  Musculoskeletal: Strength & Muscle Tone: within normal limits Gait & Station: normal Assets  Assets: Manufacturing systems engineer; Financial Resources/Insurance; Physical Health    Physical Exam: Physical Exam Vitals and nursing note reviewed.  HENT:     Head: Normocephalic.     Right Ear: Tympanic membrane normal.     Nose: Nose normal.     Mouth/Throat:     Mouth: Mucous membranes are moist.  Eyes:     Pupils: Pupils are equal, round, and reactive to light.  Cardiovascular:     Rate and Rhythm: Normal rate.  Pulmonary:     Breath sounds: Normal breath sounds.  Abdominal:     General: Bowel sounds are normal.   Skin:    General: Skin is warm.  Neurological:     General: No focal  deficit present.     Mental Status: She is alert.    Review of Systems  Constitutional: Negative.   HENT: Negative.    Eyes: Negative.   Respiratory: Negative.    Cardiovascular: Negative.   Gastrointestinal: Negative.   Skin: Negative.   Neurological: Negative.    Blood pressure (!) 163/94, pulse 95, temperature 98.2 F (36.8 C), resp. rate 16, height 5\' 6"  (1.676 m), weight 68.9 kg, SpO2 97%. Body mass index is 24.53 kg/m.  Diagnosis: Principal Problem:   MDD (major depressive disorder), recurrent episode (HCC) Sedative-hypnotic use disorder, severe Alcohol use disorder, moderate  Clinical Decision Making: Patient with depression and anxiety with chronic alcohol use admitted for inpatient hospital.  She reports feeling depressed and anxious in the context of getting divorced recently.  She is requesting for Ativan to detox from alcohol.  She denies SI/HI/plan.  Discussed Librium taper for detox.  Patient needs inpatient psychiatric hospitalization for stabilization   Treatment Plan Summary:   Safety and Monitoring:             -- Voluntary admission to inpatient psychiatric unit for safety, stabilization and treatment             -- Daily contact with patient to assess and evaluate symptoms and progress in treatment             -- Patient's case to be discussed in multi-disciplinary team meeting             -- Observation Level: q15 minute checks             -- Vital signs:  q12 hours             -- Precautions: suicide, elopement, and assault   2. Psychiatric Diagnoses and Treatment:         Will add Abilify 5 mg nightly to help with mood stabilization and treatment resistant depression, doxepin 50 mg nightly to help with her sleep.  Patient gave consent to the medications and risk benefits were discussed in thorough      Librium taper for alcohol detox Continue her home medications-Zoloft 200 mg daily,  Remeron 45 mg nightly, trazodone 100 mg nightly and 50 mg as needed   -- The risks/benefits/side-effects/alternatives to this medication were discussed in detail with the patient and time was given for questions. The patient consents to medication trial.                -- Metabolic profile and EKG monitoring obtained while on an atypical antipsychotic (BMI: Lipid Panel: HbgA1c: QTc:)              -- Encouraged patient to participate in unit milieu and in scheduled group therapies                            3. Medical Issues Being Addressed:   Alcohol detox with Librium    4. Discharge Planning:              -- Social work and case management to assist with discharge planning and identification of hospital follow-up needs prior to discharge             -- Estimated LOS: 5-7 days             -- Discharge Concerns: Need to establish a safety plan; Medication compliance and effectiveness             --  Discharge Goals: Return home with outpatient referrals follow ups   Physician Treatment Plan for Primary Diagnosis: MDD (major depressive disorder), recurrent episode (HCC) Long Term Goal(s): Improvement in symptoms so as ready for discharge   Short Term Goals: Ability to identify changes in lifestyle to reduce recurrence of condition will improve, Ability to verbalize feelings will improve, Ability to disclose and discuss suicidal ideas, Ability to demonstrate self-control will improve, and Ability to identify and develop effective coping behaviors will improve   Physician Treatment Plan for Secondary Diagnosis: Principal Problem:   MDD (major depressive disorder), recurrent episode (HCC)   Long Term Goal(s): Improvement in symptoms so as ready for discharge   Short Term Goals: Ability to identify changes in lifestyle to reduce recurrence of condition will improve, Ability to verbalize feelings will improve, Ability to disclose and discuss suicidal ideas, Ability to demonstrate self-control  will improve, Ability to identify and develop effective coping behaviors will improve, Ability to maintain clinical measurements within normal limits will improve, Compliance with prescribed medications will improve, and Ability to identify triggers associated with substance abuse/mental health issues will improve    Verner Chol, MD 02/14/2024, 9:33 AM

## 2024-02-14 NOTE — Plan of Care (Signed)
  Problem: Education: Goal: Utilization of techniques to improve thought processes will improve Outcome: Progressing Goal: Knowledge of the prescribed therapeutic regimen will improve Outcome: Progressing   Problem: Activity: Goal: Interest or engagement in leisure activities will improve Outcome: Progressing Goal: Imbalance in normal sleep/wake cycle will improve Outcome: Progressing   

## 2024-02-14 NOTE — Progress Notes (Signed)
   02/14/24 1700  Psych Admission Type (Psych Patients Only)  Admission Status Voluntary  Psychosocial Assessment  Patient Complaints Anxiety  Eye Contact Fair  Facial Expression Anxious  Affect Appropriate to circumstance  Speech Logical/coherent  Interaction Assertive  Motor Activity Slow  Appearance/Hygiene In scrubs  Behavior Characteristics Appropriate to situation  Mood Anxious  Thought Process  Coherency WDL  Content WDL  Delusions None reported or observed  Perception WDL  Hallucination None reported or observed  Judgment WDL  Confusion None  Danger to Self  Current suicidal ideation? Denies  Agreement Not to Harm Self Yes  Description of Agreement verbal  Danger to Others  Danger to Others Reported or observed  Danger to Others Abnormal  Harmful Behavior to others No threats or harm toward other people

## 2024-02-14 NOTE — Progress Notes (Signed)
   02/14/24 2000  Psych Admission Type (Psych Patients Only)  Admission Status Voluntary  Psychosocial Assessment  Patient Complaints Anxiety  Eye Contact Fair  Facial Expression Anxious  Affect Appropriate to circumstance  Speech Logical/coherent  Interaction Assertive  Motor Activity Slow  Appearance/Hygiene In scrubs  Behavior Characteristics Appropriate to situation;Cooperative  Mood Anxious  Thought Process  Coherency WDL  Content WDL  Delusions None reported or observed  Perception WDL  Hallucination None reported or observed  Judgment WDL  Confusion None  Danger to Self  Current suicidal ideation? Denies  Agreement Not to Harm Self Yes  Description of Agreement verbal  Danger to Others  Danger to Others None reported or observed  Danger to Others Abnormal  Harmful Behavior to others No threats or harm toward other people

## 2024-02-15 DIAGNOSIS — F332 Major depressive disorder, recurrent severe without psychotic features: Secondary | ICD-10-CM | POA: Diagnosis not present

## 2024-02-15 NOTE — Progress Notes (Addendum)
 [facility] MD Progress Note  02/15/2024 2:47 PM Stephanie Riley  MRN:  [id number]  Stephanie Riley, 73 y/o female with history of alcohol abuse, GAD, MDD, substance-induced mood disorder, insomnia, adjustment disorder and ADHD, presented to [facility] voluntarily. Per the patient she is having a nervous breakdown because her husband and her was going through a divorce and she found out that he divorced her without she knowing. According to patient they have been together for 50 years. Patient stated that she does see a psychiatrist here at the walk-in clinic.  Subjective:  Chart reviewed, case discussed in multidisciplinary meeting, patient seen during rounds.  Today on interview patient reports that she is trying to deal with her anxiety.  Provider continues to educate her that Librium is a benzodiazepine and she is on highest dose of Zoloft 200 which should be helping with her anxiety.  Patient has poor insight into what anxiety control looks like and remains focused on Ativan being the only medication that will help her.  She denies SI/HI/plan.  She denies auditory/visual hallucinations.  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  She initially requested provider to stop Librium but then expressed her concern about withdrawal from alcohol.  Provider and patient agreed to get on gabapentin for chronic alcohol withdrawal which also helps with anxiety.   Sleep: Fair  Appetite:  Fair  Past Psychiatric History: see h&P Family History: History reviewed. No pertinent family history. Social History:  Social History   Substance and Sexual Activity  Alcohol Use Yes     Social History   Substance and Sexual Activity  Drug Use Never    Social History   Socioeconomic History   Marital status: Married    Spouse name: Not on file   Number of children: Not on file   Years of education: Not on file   Highest education level: Not on file  Occupational History   Not on file   Tobacco Use   Smoking status: Never   Smokeless tobacco: Never  Substance and Sexual Activity   Alcohol use: Yes   Drug use: Never   Sexual activity: Not Currently  Other Topics Concern   Not on file  Social History Narrative   Not on file   Social Drivers of Health   Financial Resource Strain: Low Risk  (10/19/2022)   Received from [facility]   Overall Financial Resource Strain (CARDIA)    Difficulty of Paying Living Expenses: Not hard at all  Food Insecurity: No Food Insecurity (02/12/2024)   Hunger Vital Sign    Worried About Running Out of Food in the Last Year: Never true    Ran Out of Food in the Last Year: Never true  Transportation Needs: No Transportation Needs (02/12/2024)   PRAPARE - Administrator, Civil Service (Medical): No    Lack of Transportation (Non-Medical): No  Physical Activity: Insufficiently Active (10/19/2022)   Received from [facility], [facility]   Exercise Vital Sign    Days of Exercise per Week: 3 days    Minutes of Exercise per Session: 20 min  Stress: No Stress Concern Present (10/19/2022)   Received from [facility] of Occupational Health - Occupational Stress Questionnaire    Feeling of Stress : Not at all  Social Connections: Moderately Isolated (02/12/2024)   Social Connection and Isolation Panel [NHANES]    Frequency of Communication with Friends and Family: Once  a week    Frequency of Social Gatherings with Friends and Family: Once a week    Attends Religious Services: 1 to 4 times per year    Active Member of [employer] or Organizations: No    Attends Engineer, structural: 1 to 4 times per year    Marital Status: Divorced   Past Medical History:  Past Medical History:  Diagnosis Date   Alcohol abuse    Benzodiazepine withdrawal without complication (HCC) 02/12/2023   HTN (hypertension)    History reviewed. No pertinent surgical history.  Current  Medications: Current Facility-Administered Medications  Medication Dose Route Frequency Provider Last Rate Last Admin   acetaminophen (TYLENOL) tablet 650 mg  650 mg Oral Q6H PRN Oneta Rack, NP   650 mg at 02/14/24 1526   alum & mag hydroxide-simeth (MAALOX/MYLANTA) 200-200-20 MG/5ML suspension 30 mL  30 mL Oral Q4H PRN Oneta Rack, NP       amLODipine (NORVASC) tablet 10 mg  10 mg Oral Daily Oneta Rack, NP   10 mg at 02/15/24 0901   ARIPiprazole (ABILIFY) tablet 5 mg  5 mg Oral QHS Verner Chol, MD   5 mg at 02/14/24 2123   busPIRone (BUSPAR) tablet 15 mg  15 mg Oral TID Oneta Rack, NP   15 mg at 02/15/24 0901   chlordiazePOXIDE (LIBRIUM) capsule 25 mg  25 mg Oral TID Verner Chol, MD   25 mg at 02/15/24 0901   Followed by   chlordiazePOXIDE (LIBRIUM) capsule 25 mg  25 mg Oral Duffy Rhody, Dutch Quint, MD       Followed by   Melene Muller ON 02/16/2024] chlordiazePOXIDE (LIBRIUM) capsule 25 mg  25 mg Oral Daily Verner Chol, MD       doxepin (SINEQUAN) capsule 50 mg  50 mg Oral QHS Verner Chol, MD   50 mg at 02/14/24 2123   hydrOXYzine (ATARAX) tablet 25 mg  25 mg Oral Q6H PRN Oneta Rack, NP   25 mg at 02/13/24 1133   loperamide (IMODIUM) capsule 2-4 mg  2-4 mg Oral PRN Oneta Rack, NP       mirtazapine (REMERON SOL-TAB) disintegrating tablet 45 mg  45 mg Oral QHS Bobbitt, Shalon E, NP   45 mg at 02/14/24 2120   multivitamin with minerals tablet 1 tablet  1 tablet Oral Daily Oneta Rack, NP   1 tablet at 02/15/24 0901   ondansetron (ZOFRAN-ODT) disintegrating tablet 4 mg  4 mg Oral Q6H PRN Oneta Rack, NP       sertraline (ZOLOFT) tablet 200 mg  200 mg Oral Daily Oneta Rack, NP   200 mg at 02/15/24 0901   thiamine (VITAMIN B1) tablet 100 mg  100 mg Oral Daily Oneta Rack, NP   100 mg at 02/15/24 0901   traZODone (DESYREL) tablet 100 mg  100 mg Oral QHS Verner Chol, MD   100 mg at 02/14/24 2123   traZODone (DESYREL) tablet 50 mg  50 mg Oral  QHS PRN Verner Chol, MD   50 mg at 02/14/24 0110    Lab Results:  No results found for this or any previous visit (from the past 48 hours).   Blood Alcohol level:  Lab Results  Component Value Date   ETH <10 02/10/2024   ETH 215 (H) 09/03/2023    Metabolic Disorder Labs: Lab Results  Component Value Date   HGBA1C 4.9 05/25/2023   MPG 93.93 05/25/2023   No  results found for: "PROLACTIN" Lab Results  Component Value Date   CHOL 145 05/25/2023   TRIG 65 05/25/2023   HDL 91 05/25/2023   CHOLHDL 1.6 05/25/2023   VLDL 13 05/25/2023   LDLCALC 41 05/25/2023    Physical Findings: AIMS:  , ,  ,  ,    CIWA:  CIWA-Ar Total: 0 COWS:      Psychiatric Specialty Exam:  Presentation  General Appearance:  Appropriate for Environment; Casual  Eye Contact: Fair  Speech: Clear and Coherent  Speech Volume: Normal    Mood and Affect  Mood: Anxious  Affect: Appropriate   Thought Process  Thought Processes: Coherent  Descriptions of Associations:Intact  Orientation:Full (Time, Place and Person)  Thought Content:Illogical  Hallucinations:Hallucinations: None  Ideas of Reference:None  Suicidal Thoughts:Suicidal Thoughts: No  Homicidal Thoughts:Homicidal Thoughts: No   Sensorium  Memory: Recent Fair; Immediate Fair; Remote Fair  Judgment: Impaired  Insight: Shallow   Executive Functions  Concentration: Fair  Attention Span: Poor  Recall: [employer] of Knowledge: Fair  Language: Fair   Psychomotor Activity  Psychomotor Activity: Psychomotor Activity: Normal  Musculoskeletal: Strength & Muscle Tone: within normal limits Gait & Station: normal Assets  Assets: Manufacturing systems engineer; Desire for Improvement; Resilience    Physical Exam: Physical Exam Vitals and nursing note reviewed.  HENT:     Head: Normocephalic.     Right Ear: Tympanic membrane normal.     Nose: Nose normal.     Mouth/Throat:     Mouth: Mucous  membranes are moist.  Eyes:     Pupils: Pupils are equal, round, and reactive to light.  Cardiovascular:     Rate and Rhythm: Normal rate.  Pulmonary:     Breath sounds: Normal breath sounds.  Abdominal:     General: Bowel sounds are normal.  Skin:    General: Skin is warm.  Neurological:     General: No focal deficit present.     Mental Status: She is alert.    Review of Systems  Constitutional: Negative.   HENT: Negative.    Eyes: Negative.   Respiratory: Negative.    Cardiovascular: Negative.   Gastrointestinal: Negative.   Skin: Negative.   Neurological: Negative.    Blood pressure (!) 125/94, pulse 80, temperature 97.9 F (36.6 C), resp. rate 14, height 5\' 6"  (1.676 m), weight 68.9 kg, SpO2 99%. Body mass index is 24.53 kg/m.  Diagnosis: Principal Problem:   MDD (major depressive disorder), recurrent episode (HCC) Sedative-hypnotic use disorder, severe Alcohol use disorder, moderate  Clinical Decision Making: Patient with depression and anxiety with chronic alcohol use admitted for inpatient hospital.  She reports feeling depressed and anxious in the context of getting divorced recently.  She is requesting for Ativan to detox from alcohol.  She denies SI/HI/plan.  Discussed Librium taper for detox.  Patient needs inpatient psychiatric hospitalization for stabilization   Treatment Plan Summary:   Safety and Monitoring:             -- Voluntary admission to inpatient psychiatric unit for safety, stabilization and treatment             -- Daily contact with patient to assess and evaluate symptoms and progress in treatment             -- Patient's case to be discussed in multi-disciplinary team meeting             -- Observation Level: q15 minute checks             --  Vital signs:  q12 hours             -- Precautions: suicide, elopement, and assault   2. Psychiatric Diagnoses and Treatment:  As Librium is tapering down patient will eb started on Gabapentin 100mg  TID  to help with chronic alcoholism and anxiety        Will add Abilify 5 mg nightly to help with mood stabilization and treatment resistant depression, doxepin 50 mg nightly to help with her sleep.  Patient gave consent to the medications and risk benefits were discussed in thorough      Librium taper for alcohol detox Continue her home medications-Zoloft 200 mg daily, Remeron 45 mg nightly, trazodone 100 mg nightly and 50 mg as needed   -- The risks/benefits/side-effects/alternatives to this medication were discussed in detail with the patient and time was given for questions. The patient consents to medication trial.                -- Metabolic profile and EKG monitoring obtained while on an atypical antipsychotic (BMI: Lipid Panel: HbgA1c: QTc:)              -- Encouraged patient to participate in unit milieu and in scheduled group therapies                            3. Medical Issues Being Addressed:   Alcohol detox with Librium    4. Discharge Planning:              -- Social work and case management to assist with discharge planning and identification of hospital follow-up needs prior to discharge             -- Estimated LOS: 5-7 days             -- Discharge Concerns: Need to establish a safety plan; Medication compliance and effectiveness             -- Discharge Goals: Return home with outpatient referrals follow ups   Physician Treatment Plan for Primary Diagnosis: MDD (major depressive disorder), recurrent episode (HCC) Long Term Goal(s): Improvement in symptoms so as ready for discharge   Short Term Goals: Ability to identify changes in lifestyle to reduce recurrence of condition will improve, Ability to verbalize feelings will improve, Ability to disclose and discuss suicidal ideas, Ability to demonstrate self-control will improve, and Ability to identify and develop effective coping behaviors will improve   Physician Treatment Plan for Secondary Diagnosis: Principal Problem:    MDD (major depressive disorder), recurrent episode (HCC)   Long Term Goal(s): Improvement in symptoms so as ready for discharge   Short Term Goals: Ability to identify changes in lifestyle to reduce recurrence of condition will improve, Ability to verbalize feelings will improve, Ability to disclose and discuss suicidal ideas, Ability to demonstrate self-control will improve, Ability to identify and develop effective coping behaviors will improve, Ability to maintain clinical measurements within normal limits will improve, Compliance with prescribed medications will improve, and Ability to identify triggers associated with substance abuse/mental health issues will improve    Verner Chol, MD 02/15/2024, 2:47 PM

## 2024-02-15 NOTE — Group Note (Signed)
 Date:  02/15/2024 Time:  8:50 PM  Group Topic/Focus:  Wrap-Up Group:   The focus of this group is to help patients review their daily goal of treatment and discuss progress on daily workbooks.    Participation Level:  Active  Participation Quality:  Appropriate  Affect:  Appropriate  Cognitive:  Appropriate  Insight: Appropriate  Engagement in Group:  Engaged  Modes of Intervention:  Discussion  Additional Comments:    Chesni Vos K Shunte Senseney 02/15/2024, 8:50 PM

## 2024-02-15 NOTE — Group Note (Signed)
 Recreation Therapy Group Note   Group Topic:Stress Management  Group Date: 02/15/2024 Start Time: 1215 End Time: 1300 Facilitators: Rosina Lowenstein, LRT, CTRS Location: Courtyard  Group Description: Outdoor Recreation. Patients had the option to play corn hole, ring toss, bowling or listening to music while outside in the courtyard getting fresh air and sunlight. LRT and patients discussed things that they enjoy doing in their free time outside of the hospital. LRT encouraged patients to drink water after being active and getting their heart rate up.   Goal Area(s) Addressed: Patient will identify leisure interests.  Patient will practice healthy decision making. Patient will engage in recreation activity.   Affect/Mood: N/A   Participation Level: Did not attend    Clinical Observations/Individualized Feedback: Patient did not attend group.   Plan: Continue to engage patient in RT group sessions 2-3x/week.   Rosina Lowenstein, LRT, CTRS 02/15/2024 1:23 PM

## 2024-02-15 NOTE — Group Note (Signed)
 Date:  02/15/2024 Time:  3:04 AM  Group Topic/Focus:  Wrap-Up Group:   The focus of this group is to help patients review their daily goal of treatment and discuss progress on daily workbooks.    Participation Level:  Active  Participation Quality:  Appropriate  Affect:  Appropriate  Cognitive:  Appropriate  Insight: Appropriate  Engagement in Group:  Engaged  Modes of Intervention:  Discussion  Additional Comments:    Stephanie Riley 02/15/2024, 3:04 AM

## 2024-02-15 NOTE — [facility] Counselor (Signed)
 CSW faxed referral to [facility] (F: [phone]) per pt's request.   Reynaldo Minium, MSW, [facility] 02/15/2024 3:02 PM

## 2024-02-15 NOTE — Plan of Care (Signed)
   02/15/24 2000  Psych Admission Type (Psych Patients Only)  Admission Status Voluntary  Psychosocial Assessment  Patient Complaints Anxiety  Eye Contact Fair  Facial Expression Anxious  Affect Anxious  Speech Logical/coherent  Interaction Cautious  Motor Activity Slow  Appearance/Hygiene Unremarkable  Behavior Characteristics Cooperative  Mood Anxious  Thought Process  Coherency WDL  Content WDL  Delusions None reported or observed  Perception WDL  Hallucination None reported or observed  Judgment WDL  Confusion None  Danger to Self  Current suicidal ideation? Denies  Danger to Others  Danger to Others None reported or observed  Danger to Others Abnormal  Harmful Behavior to others No threats or harm toward other people    Problem: Education: Goal: Utilization of techniques to improve thought processes will improve Outcome: Progressing Goal: Knowledge of the prescribed therapeutic regimen will improve Outcome: Progressing   Problem: Coping: Goal: Coping ability will improve Outcome: Progressing Goal: Will verbalize feelings Outcome: Progressing

## 2024-02-15 NOTE — Group Note (Signed)
 Date:  02/15/2024 Time:  9:29 AM  Group Topic/Focus:  Movement therapy    Participation Level:  Did Not Attend    Rodena Goldmann 02/15/2024, 9:29 AM

## 2024-02-15 NOTE — Plan of Care (Signed)
 Marland Kitchen

## 2024-02-15 NOTE — Progress Notes (Signed)
   02/15/24 0940  Psych Admission Type (Psych Patients Only)  Admission Status Voluntary  Psychosocial Assessment  Patient Complaints Anxiety  Eye Contact Fair  Facial Expression Anxious  Affect Anxious  Speech Logical/coherent  Interaction Minimal  Motor Activity Slow  Appearance/Hygiene Unremarkable  Behavior Characteristics Appropriate to situation  Mood Anxious  Thought Process  Coherency WDL  Content WDL  Delusions None reported or observed  Perception WDL  Hallucination None reported or observed  Judgment WDL  Confusion None  Danger to Self  Current suicidal ideation? Denies  Agreement Not to Harm Self Yes  Description of Agreement verbal  Danger to Others  Danger to Others None reported or observed  Danger to Others Abnormal  Harmful Behavior to others No threats or harm toward other people

## 2024-02-16 DIAGNOSIS — F332 Major depressive disorder, recurrent severe without psychotic features: Secondary | ICD-10-CM | POA: Diagnosis not present

## 2024-02-16 NOTE — Plan of Care (Signed)
  Problem: Education: Goal: Utilization of techniques to improve thought processes will improve Outcome: Progressing Goal: Knowledge of the prescribed therapeutic regimen will improve Outcome: Progressing   Problem: Coping: Goal: Coping ability will improve Outcome: Progressing Goal: Will verbalize feelings Outcome: Progressing   Problem: Health Behavior/Discharge Planning: Goal: Ability to make decisions will improve Outcome: Progressing Goal: Compliance with therapeutic regimen will improve Outcome: Progressing

## 2024-02-16 NOTE — Progress Notes (Incomplete)
   02/16/24 2200  Psych Admission Type (Psych Patients Only)  Admission Status Voluntary  Psychosocial Assessment  Patient Complaints Anxiety  Eye Contact Fair  Facial Expression Anxious  Affect Anxious  Speech Logical/coherent  Interaction Minimal  Motor Activity Slow  Appearance/Hygiene Unremarkable  Behavior Characteristics Cooperative  Mood Euthymic  Thought Process  Coherency WDL  Content WDL  Delusions None reported or observed  Perception WDL  Hallucination None reported or observed  Judgment WDL  Confusion None  Danger to Self  Current suicidal ideation? Denies  Danger to Others  Danger to Others None reported or observed

## 2024-02-16 NOTE — Group Note (Signed)
 Date:  02/16/2024 Time:  10:35 AM  Group Topic/Focus:  Orientation:   The focus of this group is to educate the patient on the purpose and policies of crisis stabilization and provide a format to answer questions about their admission.  The group details unit policies and expectations of patients while admitted.    Participation Level:  Active  Participation Quality:  Appropriate and Attentive  Affect:  Appropriate  Cognitive:  Appropriate  Insight: Appropriate  Engagement in Group:  Engaged  Modes of Intervention:  Socialization  Additional Comments:     Stephanie Riley 02/16/2024, 10:35 AM

## 2024-02-16 NOTE — Plan of Care (Signed)
  Problem: Health Behavior/Discharge Planning: Goal: Ability to make decisions will improve Outcome: Progressing Goal: Compliance with therapeutic regimen will improve Outcome: Progressing

## 2024-02-16 NOTE — Progress Notes (Signed)
 [facility] MD Progress Note  02/16/2024 3:22 PM Stephanie Riley  MRN:  [id number]  Stephanie Riley, 73 y/o female with history of alcohol abuse, GAD, MDD, substance-induced mood disorder, insomnia, adjustment disorder and ADHD, presented to [facility] voluntarily. Per the patient she is having a nervous breakdown because her husband and her was going through a divorce and she found out that he divorced her without she knowing. According to patient they have been together for 50 years. Patient stated that she does see a psychiatrist here at the walk-in clinic.  Subjective:  Chart reviewed, case discussed in multidisciplinary meeting, patient seen during rounds.  Patient is noted to be resting in her room.  She reports sleeping well and having fair appetite.  She continues to talk about anxiety being worse and needing long detox.  Provider had to continue to educate her that Librium is a benzodiazepine and that she is on Librium taper.  She was also added on gabapentin to help with the chronic alcoholism.  Patient remains focused on Ativan.  Patient was educated again about Abilify being the mood stabilizer and antipsychotic helping with her mood.  She denies SI/HI/intent/plan.  She denies auditory/visual hallucinations.  Sleep: Fair  Appetite:  Fair  Past Psychiatric History: see h&P Family History: History reviewed. No pertinent family history. Social History:  Social History   Substance and Sexual Activity  Alcohol Use Yes     Social History   Substance and Sexual Activity  Drug Use Never    Social History   Socioeconomic History   Marital status: Married    Spouse name: Not on file   Number of children: Not on file   Years of education: Not on file   Highest education level: Not on file  Occupational History   Not on file  Tobacco Use   Smoking status: Never   Smokeless tobacco: Never  Substance and Sexual Activity   Alcohol use: Yes   Drug use: Never   Sexual activity: Not Currently   Other Topics Concern   Not on file  Social History Narrative   Not on file   Social Drivers of Health   Financial Resource Strain: Low Risk  (10/19/2022)   Received from [facility], [facility]   Overall Financial Resource Strain (CARDIA)    Difficulty of Paying Living Expenses: Not hard at all  Food Insecurity: No Food Insecurity (02/12/2024)   Hunger Vital Sign    Worried About Running Out of Food in the Last Year: Never true    Ran Out of Food in the Last Year: Never true  Transportation Needs: No Transportation Needs (02/12/2024)   PRAPARE - Administrator, Civil Service (Medical): No    Lack of Transportation (Non-Medical): No  Physical Activity: Insufficiently Active (10/19/2022)   Received from [facility], [facility]   Exercise Vital Sign    Days of Exercise per Week: 3 days    Minutes of Exercise per Session: 20 min  Stress: No Stress Concern Present (10/19/2022)   Received from [facility], [facility] of Occupational Health - Occupational Stress Questionnaire    Feeling of Stress : Not at all  Social Connections: Moderately Isolated (02/12/2024)   Social Connection and Isolation Panel [NHANES]    Frequency of Communication with Friends and Family: Once a week    Frequency of Social Gatherings with Friends and Family: Once a week    Attends Religious Services: 1 to 4 times per  year    Active Member of Clubs or Organizations: No    Attends Banker Meetings: 1 to 4 times per year    Marital Status: Divorced   Past Medical History:  Past Medical History:  Diagnosis Date   Alcohol abuse    Benzodiazepine withdrawal without complication (HCC) 02/12/2023   HTN (hypertension)    History reviewed. No pertinent surgical history.  Current Medications: Current Facility-Administered Medications  Medication Dose Route Frequency Provider Last Rate Last Admin   acetaminophen (TYLENOL) tablet 650 mg  650 mg Oral Q6H PRN  Oneta Rack, NP   650 mg at 02/14/24 1526   alum & mag hydroxide-simeth (MAALOX/MYLANTA) 200-200-20 MG/5ML suspension 30 mL  30 mL Oral Q4H PRN Oneta Rack, NP       amLODipine (NORVASC) tablet 10 mg  10 mg Oral Daily Oneta Rack, NP   10 mg at 02/16/24 1610   ARIPiprazole (ABILIFY) tablet 5 mg  5 mg Oral QHS Verner Chol, MD   5 mg at 02/15/24 2134   busPIRone (BUSPAR) tablet 15 mg  15 mg Oral TID Oneta Rack, NP   15 mg at 02/16/24 9604   chlordiazePOXIDE (LIBRIUM) capsule 25 mg  25 mg Oral Daily Verner Chol, MD       doxepin (SINEQUAN) capsule 50 mg  50 mg Oral QHS Verner Chol, MD   50 mg at 02/15/24 2134   mirtazapine (REMERON SOL-TAB) disintegrating tablet 45 mg  45 mg Oral QHS Bobbitt, Shalon E, NP   45 mg at 02/15/24 2135   multivitamin with minerals tablet 1 tablet  1 tablet Oral Daily Oneta Rack, NP   1 tablet at 02/16/24 5409   sertraline (ZOLOFT) tablet 200 mg  200 mg Oral Daily Oneta Rack, NP   200 mg at 02/16/24 8119   thiamine (VITAMIN B1) tablet 100 mg  100 mg Oral Daily Oneta Rack, NP   100 mg at 02/16/24 1478   traZODone (DESYREL) tablet 100 mg  100 mg Oral QHS Verner Chol, MD   100 mg at 02/15/24 2134   traZODone (DESYREL) tablet 50 mg  50 mg Oral QHS PRN Verner Chol, MD   50 mg at 02/14/24 0110    Lab Results:  No results found for this or any previous visit (from the past 48 hours).   Blood Alcohol level:  Lab Results  Component Value Date   ETH <10 02/10/2024   ETH 215 (H) 09/03/2023    Metabolic Disorder Labs: Lab Results  Component Value Date   HGBA1C 4.9 05/25/2023   MPG 93.93 05/25/2023   No results found for: "PROLACTIN" Lab Results  Component Value Date   CHOL 145 05/25/2023   TRIG 65 05/25/2023   HDL 91 05/25/2023   CHOLHDL 1.6 05/25/2023   VLDL 13 05/25/2023   LDLCALC 41 05/25/2023    Physical Findings: AIMS:  , ,  ,  ,    CIWA:  CIWA-Ar Total: 0 COWS:      Psychiatric Specialty  Exam:  Presentation  General Appearance:  Appropriate for Environment; Casual  Eye Contact: Fair  Speech: Clear and Coherent  Speech Volume: Normal    Mood and Affect  Mood: Anxious  Affect: Depressed   Thought Process  Thought Processes: Coherent  Descriptions of Associations:Intact  Orientation:Full (Time, Place and Person)  Thought Content:Logical  Hallucinations:Hallucinations: None  Ideas of Reference:None  Suicidal Thoughts:Suicidal Thoughts: No  Homicidal Thoughts:Homicidal Thoughts: No  Sensorium  Memory: Immediate Fair; Recent Fair; Remote Fair  Judgment: Impaired  Insight: Shallow   Executive Functions  Concentration: Poor  Attention Span: Fair  Recall: [employer] of Knowledge: Fair  Language: Fair   Psychomotor Activity  Psychomotor Activity: Psychomotor Activity: Normal  Musculoskeletal: Strength & Muscle Tone: within normal limits Gait & Station: normal Assets  Assets: Manufacturing systems engineer; Desire for Improvement; Resilience    Physical Exam: Physical Exam Vitals and nursing note reviewed.  HENT:     Head: Normocephalic.     Right Ear: Tympanic membrane normal.     Nose: Nose normal.     Mouth/Throat:     Mouth: Mucous membranes are moist.  Eyes:     Pupils: Pupils are equal, round, and reactive to light.  Cardiovascular:     Rate and Rhythm: Normal rate.  Pulmonary:     Breath sounds: Normal breath sounds.  Abdominal:     General: Bowel sounds are normal.  Skin:    General: Skin is warm.  Neurological:     General: No focal deficit present.     Mental Status: She is alert.    Review of Systems  Constitutional: Negative.   HENT: Negative.    Eyes: Negative.   Respiratory: Negative.    Cardiovascular: Negative.   Gastrointestinal: Negative.   Skin: Negative.   Neurological: Negative.    Blood pressure (!) 141/96, pulse 89, temperature 98.1 F (36.7 C), resp. rate 18, height 5\' 6"   (1.676 m), weight 68.9 kg, SpO2 97%. Body mass index is 24.53 kg/m.  Diagnosis: Principal Problem:   MDD (major depressive disorder), recurrent episode (HCC) Sedative-hypnotic use disorder, severe Alcohol use disorder, moderate  Clinical Decision Making: Patient with depression and anxiety with chronic alcohol use admitted for inpatient hospital.  She reports feeling depressed and anxious in the context of getting divorced recently.  She is requesting for Ativan to detox from alcohol.  She denies SI/HI/plan.  Discussed Librium taper for detox.  Patient needs inpatient psychiatric hospitalization for stabilization   Treatment Plan Summary:   Safety and Monitoring:             -- Voluntary admission to inpatient psychiatric unit for safety, stabilization and treatment             -- Daily contact with patient to assess and evaluate symptoms and progress in treatment             -- Patient's case to be discussed in multi-disciplinary team meeting             -- Observation Level: q15 minute checks             -- Vital signs:  q12 hours             -- Precautions: suicide, elopement, and assault   2. Psychiatric Diagnoses and Treatment:  Librium is tapering down  Gabapentin 100mg  TID to help with chronic alcoholism and anxiety    Abilify 5 mg nightly to help with mood stabilization and treatment resistant depression, doxepin 50 mg nightly to help with her sleep.  Patient gave consent to the medications and risk benefits were discussed in thorough      Librium taper for alcohol detox Continue her home medications-Zoloft 200 mg daily, Remeron 45 mg nightly, trazodone 100 mg nightly and 50 mg as needed   -- The risks/benefits/side-effects/alternatives to this medication were discussed in detail with the patient and time was given for questions. The patient  consents to medication trial.                -- Metabolic profile and EKG monitoring obtained while on an atypical antipsychotic (BMI: Lipid  Panel: HbgA1c: QTc:)              -- Encouraged patient to participate in unit milieu and in scheduled group therapies                            3. Medical Issues Being Addressed:   Alcohol detox with Librium    4. Discharge Planning:              -- Social work and case management to assist with discharge planning and identification of hospital follow-up needs prior to discharge             -- Estimated LOS: 5-7 days             -- Discharge Concerns: Need to establish a safety plan; Medication compliance and effectiveness             -- Discharge Goals: Return home with outpatient referrals follow ups   Physician Treatment Plan for Primary Diagnosis: MDD (major depressive disorder), recurrent episode (HCC) Long Term Goal(s): Improvement in symptoms so as ready for discharge   Short Term Goals: Ability to identify changes in lifestyle to reduce recurrence of condition will improve, Ability to verbalize feelings will improve, Ability to disclose and discuss suicidal ideas, Ability to demonstrate self-control will improve, and Ability to identify and develop effective coping behaviors will improve   Physician Treatment Plan for Secondary Diagnosis: Principal Problem:   MDD (major depressive disorder), recurrent episode (HCC)   Long Term Goal(s): Improvement in symptoms so as ready for discharge   Short Term Goals: Ability to identify changes in lifestyle to reduce recurrence of condition will improve, Ability to verbalize feelings will improve, Ability to disclose and discuss suicidal ideas, Ability to demonstrate self-control will improve, Ability to identify and develop effective coping behaviors will improve, Ability to maintain clinical measurements within normal limits will improve, Compliance with prescribed medications will improve, and Ability to identify triggers associated with substance abuse/mental health issues will improve    Verner Chol, MD 02/16/2024, 3:22 PM

## 2024-02-16 NOTE — Progress Notes (Signed)
   02/16/24 1000  Psych Admission Type (Psych Patients Only)  Admission Status Voluntary  Psychosocial Assessment  Patient Complaints Anxiety  Eye Contact Fair  Facial Expression Anxious  Affect Anxious  Speech Logical/coherent  Interaction Minimal  Motor Activity Slow  Appearance/Hygiene Other (Comment) (appropriate)  Behavior Characteristics Cooperative  Mood Anxious  Thought Process  Coherency WDL  Content WDL  Delusions None reported or observed  Perception WDL  Hallucination None reported or observed  Judgment WDL  Confusion None  Danger to Self  Current suicidal ideation? Denies  Agreement Not to Harm Self Yes  Description of Agreement verbal  Danger to Others  Danger to Others None reported or observed  Danger to Others Abnormal  Harmful Behavior to others No threats or harm toward other people  Destructive Behavior No threats or harm toward property

## 2024-02-16 NOTE — Group Note (Signed)
 Date:  02/16/2024 Time:  9:46 PM  Group Topic/Focus:  Self Care:   The focus of this group is to help patients understand the importance of self-care in order to improve or restore emotional, physical, spiritual, interpersonal, and financial health.    Participation Level:  Active  Participation Quality:  Appropriate  Affect:  Appropriate  Cognitive:  Appropriate  Insight: Appropriate  Engagement in Group:  Engaged  Modes of Intervention:  Education  Additional Comments:    [name] 02/16/2024, 9:46 PM

## 2024-02-16 NOTE — Group Note (Signed)
 Therapy Group Note  Group Topic: Transfer Training  Group Date: 02/16/2024 Start Time: 1300 End Time: 1335 Facilitators: Margeret Stachnik, Thomes Dinning, PT    Group Description: Group educated on sequence and techniques to maximize safety with functional transfers.  Additionally, integrated education on impact of seating surfaces, use of assistive device and management of orthostasis with movement transitions.  Patients actively engaged with functional transfers (sit/stand) from various seating surfaces, with and without assist devices, working to integrate and retain education provided during session.  Allowed time for questions and further discussion on mobility concerns/needs.     Therapeutic Goal(s): Identify and demonstrate safe technique for sit/stand transfers from various seating surfaces. Identify and demonstrate safe use of assistive devices with basic transfers and simple mobility. Identify and demonstrate ability to recognize signs/symptoms of orthostasis and appropriate compensatory/safety techniques.   Individual Participation: Pt not present      Participation Level: Did not attend   Participation Quality:    Behavior:    Speech/Thought Process:    Affect/Mood:    Insight:    Judgement:    Individualization:   Modes of Intervention:   Patient Response to Interventions:     Plan: Continue to engage patient in OT groups 1 - 2x/week.  Ovidio Hanger PT, DPT 02/16/24, 2:34 PM

## 2024-02-17 DIAGNOSIS — F332 Major depressive disorder, recurrent severe without psychotic features: Secondary | ICD-10-CM | POA: Diagnosis not present

## 2024-02-17 MED ORDER — GABAPENTIN 100 MG PO CAPS
100.0000 mg | ORAL_CAPSULE | Freq: Three times a day (TID) | ORAL | Status: DC
  Administered 2024-02-17 – 2024-02-20 (×10): 100 mg via ORAL
  Filled 2024-02-17 (×10): qty 1

## 2024-02-17 MED ORDER — IBUPROFEN 200 MG PO TABS
400.0000 mg | ORAL_TABLET | Freq: Four times a day (QID) | ORAL | Status: DC | PRN
Start: 2024-02-17 — End: 2024-02-23
  Administered 2024-02-17 – 2024-02-23 (×4): 400 mg via ORAL
  Filled 2024-02-17 (×5): qty 2

## 2024-02-17 NOTE — [facility] Counselor (Signed)
 CSW contacted [facility] at [phone].   They reported that they did not have the referral  CSW inquired about where it could have gone as CSW received successful fax report on 02/15/24.   Intake coordinator reports she is unsure and to re-send the referral.   CSW will resent referral.   Reynaldo Minium, MSW, [facility] 02/17/2024 2:18 PM

## 2024-02-17 NOTE — Plan of Care (Signed)
  Problem: Self-Concept: Goal: Will verbalize positive feelings about self Outcome: Progressing Goal: Level of anxiety will decrease Outcome: Not Progressing

## 2024-02-17 NOTE — [facility] Counselor (Signed)
 CSW faxed second referral to [facility] at [facility], MSW, [facility] 02/17/2024 2:39 PM

## 2024-02-17 NOTE — B[facility] Counselor (Signed)
 CSW had conversation with pt about discharge planning.   Pt reports at this time she is only interested in residential treatment at [facility].   CSW received return call from [facility]. They report that they do NOT have a residential program for substance use.   CSW will inform pt.    Reynaldo Minium, MSW, [place] 02/17/2024 3:21 PM

## 2024-02-17 NOTE — Group Note (Signed)
 Date:  02/17/2024 Time:  9:12 PM  Group Topic/Focus:  Making Healthy Choices:   The focus of this group is to help patients identify negative/unhealthy choices they were using prior to admission and identify positive/healthier coping strategies to replace them upon discharge.    Participation Level:  Active  Participation Quality:  Appropriate  Affect:  Appropriate  Cognitive:  Appropriate  Insight: Appropriate and Good  Engagement in Group:  Engaged  Modes of Intervention:  Discussion  Additional Comments:    Burt Ek 02/17/2024, 9:12 PM

## 2024-02-17 NOTE — [facility] Suicide Risk Assessment (Signed)
 [facility] INPATIENT:  Family/Significant Other Suicide Prevention Education  Suicide Prevention Education:  Education Completed; Beverly Milch, daughter, [phone] of family member/significant other) has been identified by the patient as the family member/significant other with whom the patient will be residing, and identified as the person(s) who will aid the patient in the event of a mental health crisis (suicidal ideations/suicide attempt).  With written consent from the patient, the family member/significant other has been provided the following suicide prevention education, prior to the and/or following the discharge of the patient.  The suicide prevention education provided includes the following: Suicide risk factors Suicide prevention and interventions National Suicide Hotline telephone number [facility] assessment telephone number [facility] Emergency Assistance 911 [facility] and/or Residential Mobile Crisis Unit telephone number  Request made of family/significant other to: Remove weapons (e.g., guns, rifles, knives), all items previously/currently identified as safety concern.   Remove drugs/medications (over-the-counter, prescriptions, illicit drugs), all items previously/currently identified as a safety concern.  The family member/significant other verbalizes understanding of the suicide prevention education information provided.  The family member/significant other agrees to remove the items of safety concern listed above.  Elza Rafter 02/17/2024, 2:13 PM

## 2024-02-17 NOTE — Progress Notes (Signed)
 [facility] MD Progress Note  02/17/2024 9:37 AM Stephanie Riley  MRN:  [id number]  Stephanie Riley, 73 y/o female with history of alcohol abuse, GAD, MDD, substance-induced mood disorder, insomnia, adjustment disorder and ADHD, presented to [facility] voluntarily. Per the patient she is having a nervous breakdown because her husband and her was going through a divorce and she found out that he divorced her without she knowing. According to patient they have been together for 50 years. Patient stated that she does see a psychiatrist here at the walk-in clinic.  Subjective:  Chart reviewed, case discussed in multidisciplinary meeting, patient seen during rounds.  Patient is noted to be resting in bed.  She reports feeling better.  She wanted to know how many Librium doses are available.  Provider educated her that today she will get 1 dose and that will be the end of Librium.  Discussed the gabapentin 100 mg 3 times daily with plan to increase the dose to help with chronic benzos/alcoholism and anxiety.  She is tolerating other medications fairly well including Abilify, Zoloft, BuSpar.  Discussed in detail about residential facility after discharge where she can maintain severity from alcohol and benzos and continue to work with therapy to help with her anxious thoughts.  Patient was also educated that her CIWA score has been 0 consistently since her admission.  Patient denies SI/HI/intent/plan.  Patient denies auditory/visual hallucinations.  Sleep: Fair  Appetite:  Fair  Past Psychiatric History: see h&P Family History: History reviewed. No pertinent family history. Social History:  Social History   Substance and Sexual Activity  Alcohol Use Yes     Social History   Substance and Sexual Activity  Drug Use Never    Social History   Socioeconomic History   Marital status: Married    Spouse name: Not on file   Number of children: Not on file   Years of education: Not on file   Highest education  level: Not on file  Occupational History   Not on file  Tobacco Use   Smoking status: Never   Smokeless tobacco: Never  Substance and Sexual Activity   Alcohol use: Yes   Drug use: Never   Sexual activity: Not Currently  Other Topics Concern   Not on file  Social History Narrative   Not on file   Social Drivers of Health   Financial Resource Strain: Low Risk  (10/19/2022)   Received from [facility], [facility]   Overall Financial Resource Strain (CARDIA)    Difficulty of Paying Living Expenses: Not hard at all  Food Insecurity: No Food Insecurity (02/12/2024)   Hunger Vital Sign    Worried About Running Out of Food in the Last Year: Never true    Ran Out of Food in the Last Year: Never true  Transportation Needs: No Transportation Needs (02/12/2024)   PRAPARE - Administrator, Civil Service (Medical): No    Lack of Transportation (Non-Medical): No  Physical Activity: Insufficiently Active (10/19/2022)   Received from [facility], [facility]   Exercise Vital Sign    Days of Exercise per Week: 3 days    Minutes of Exercise per Session: 20 min  Stress: No Stress Concern Present (10/19/2022)   Received from [facility], [facility] - Occupational Stress Questionnaire    Feeling of Stress : Not at all  Social Connections: Moderately Isolated (02/12/2024)   Social Connection and Isolation Panel [NHANES]  Frequency of Communication with Friends and Family: Once a week    Frequency of Social Gatherings with Friends and Family: Once a week    Attends Religious Services: 1 to 4 times per year    Active Member of [employer] or Organizations: No    Attends Engineer, structural: 1 to 4 times per year    Marital Status: Divorced   Past Medical History:  Past Medical History:  Diagnosis Date   Alcohol abuse    Benzodiazepine withdrawal without complication (HCC) 02/12/2023   HTN (hypertension)    History  reviewed. No pertinent surgical history.  Current Medications: Current Facility-Administered Medications  Medication Dose Route Frequency Provider Last Rate Last Admin   acetaminophen (TYLENOL) tablet 650 mg  650 mg Oral Q6H PRN Oneta Rack, NP   650 mg at 02/14/24 1526   alum & mag hydroxide-simeth (MAALOX/MYLANTA) 200-200-20 MG/5ML suspension 30 mL  30 mL Oral Q4H PRN Oneta Rack, NP       amLODipine (NORVASC) tablet 10 mg  10 mg Oral Daily Oneta Rack, NP   10 mg at 02/17/24 0931   ARIPiprazole (ABILIFY) tablet 5 mg  5 mg Oral QHS Verner Chol, MD   5 mg at 02/16/24 2058   busPIRone (BUSPAR) tablet 15 mg  15 mg Oral TID Oneta Rack, NP   15 mg at 02/17/24 0930   doxepin (SINEQUAN) capsule 50 mg  50 mg Oral QHS Verner Chol, MD   50 mg at 02/16/24 2058   gabapentin (NEURONTIN) capsule 100 mg  100 mg Oral TID Verner Chol, MD       mirtazapine (REMERON SOL-TAB) disintegrating tablet 45 mg  45 mg Oral QHS Bobbitt, Shalon E, NP   45 mg at 02/16/24 2058   multivitamin with minerals tablet 1 tablet  1 tablet Oral Daily Oneta Rack, NP   1 tablet at 02/17/24 0931   sertraline (ZOLOFT) tablet 200 mg  200 mg Oral Daily Oneta Rack, NP   200 mg at 02/17/24 4098   thiamine (VITAMIN B1) tablet 100 mg  100 mg Oral Daily Oneta Rack, NP   100 mg at 02/17/24 0930   traZODone (DESYREL) tablet 100 mg  100 mg Oral QHS Verner Chol, MD   100 mg at 02/16/24 2058   traZODone (DESYREL) tablet 50 mg  50 mg Oral QHS PRN Verner Chol, MD   50 mg at 02/16/24 2337    Lab Results:  No results found for this or any previous visit (from the past 48 hours).   Blood Alcohol level:  Lab Results  Component Value Date   ETH <10 02/10/2024   ETH 215 (H) 09/03/2023    Metabolic Disorder Labs: Lab Results  Component Value Date   HGBA1C 4.9 05/25/2023   MPG 93.93 05/25/2023   No results found for: "PROLACTIN" Lab Results  Component Value Date   CHOL 145 05/25/2023    TRIG 65 05/25/2023   HDL 91 05/25/2023   CHOLHDL 1.6 05/25/2023   VLDL 13 05/25/2023   LDLCALC 41 05/25/2023    Physical Findings: AIMS:  , ,  ,  ,    CIWA:  CIWA-Ar Total: 0 COWS:      Psychiatric Specialty Exam:  Presentation  General Appearance:  Appropriate for Environment; Casual  Eye Contact: Fair  Speech: Clear and Coherent  Speech Volume: Normal    Mood and Affect  Mood: Anxious  Affect: Depressed   Thought Process  Thought Processes: Coherent  Descriptions of Associations:Intact  Orientation:Full (Time, Place and Person)  Thought Content:Illogical  Hallucinations:Hallucinations: None  Ideas of Reference:None  Suicidal Thoughts:Suicidal Thoughts: No  Homicidal Thoughts:Homicidal Thoughts: No   Sensorium  Memory: Immediate Fair; Recent Fair; Remote Fair  Judgment: Impaired  Insight: Shallow   Executive Functions  Concentration: Fair  Attention Span: Fair  Recall: Fair  Fund of Knowledge: Fair  Language: Fair   Psychomotor Activity  Psychomotor Activity: Psychomotor Activity: Normal  Musculoskeletal: Strength & Muscle Tone: within normal limits Gait & Station: normal Assets  Assets: Manufacturing systems engineer; Financial Resources/Insurance; Resilience    Physical Exam: Physical Exam Vitals and nursing note reviewed.  HENT:     Head: Normocephalic.     Right Ear: Tympanic membrane normal.     Nose: Nose normal.     Mouth/Throat:     Mouth: Mucous membranes are moist.  Eyes:     Pupils: Pupils are equal, round, and reactive to light.  Cardiovascular:     Rate and Rhythm: Normal rate.  Pulmonary:     Breath sounds: Normal breath sounds.  Abdominal:     General: Bowel sounds are normal.  Skin:    General: Skin is warm.  Neurological:     General: No focal deficit present.     Mental Status: She is alert.    Review of Systems  Constitutional: Negative.   HENT: Negative.    Eyes: Negative.    Respiratory: Negative.    Cardiovascular: Negative.   Gastrointestinal: Negative.   Skin: Negative.   Neurological: Negative.    Blood pressure (!) 143/76, pulse 86, temperature (!) 97 F (36.1 C), resp. rate 18, height 5\' 6"  (1.676 m), weight 68.9 kg, SpO2 96%. Body mass index is 24.53 kg/m.  Diagnosis: Principal Problem:   MDD (major depressive disorder), recurrent episode (HCC) Sedative-hypnotic use disorder, severe Alcohol use disorder, moderate  Clinical Decision Making: Patient with depression and anxiety with chronic alcohol use admitted for inpatient hospital.  She reports feeling depressed and anxious in the context of getting divorced recently.  She is requesting for Ativan to detox from alcohol.  She denies SI/HI/plan.  Discussed Librium taper for detox.  Patient needs inpatient psychiatric hospitalization for stabilization   Treatment Plan Summary:   Safety and Monitoring:             -- Voluntary admission to inpatient psychiatric unit for safety, stabilization and treatment             -- Daily contact with patient to assess and evaluate symptoms and progress in treatment             -- Patient's case to be discussed in multi-disciplinary team meeting             -- Observation Level: q15 minute checks             -- Vital signs:  q12 hours             -- Precautions: suicide, elopement, and assault   2. Psychiatric Diagnoses and Treatment:  Librium is tapering down  Gabapentin 100mg  TID to help with chronic alcoholism and anxiety    Abilify 5 mg nightly to help with mood stabilization and treatment resistant depression, doxepin 50 mg nightly to help with her sleep.  Patient gave consent to the medications and risk benefits were discussed in thorough      Librium taper for alcohol detox Continue her home medications-Zoloft 200 mg daily, Remeron  45 mg nightly, trazodone 100 mg nightly and 50 mg as needed   -- The risks/benefits/side-effects/alternatives to this  medication were discussed in detail with the patient and time was given for questions. The patient consents to medication trial.                -- Metabolic profile and EKG monitoring obtained while on an atypical antipsychotic (BMI: Lipid Panel: HbgA1c: QTc:)              -- Encouraged patient to participate in unit milieu and in scheduled group therapies                            3. Medical Issues Being Addressed:   Alcohol detox with Librium    4. Discharge Planning:              -- Social work and case management to assist with discharge planning and identification of hospital follow-up needs prior to discharge             -- Estimated LOS: 5-7 days             -- Discharge Concerns: Need to establish a safety plan; Medication compliance and effectiveness             -- Discharge Goals: Return home with outpatient referrals follow ups   Physician Treatment Plan for Primary Diagnosis: MDD (major depressive disorder), recurrent episode (HCC) Long Term Goal(s): Improvement in symptoms so as ready for discharge   Short Term Goals: Ability to identify changes in lifestyle to reduce recurrence of condition will improve, Ability to verbalize feelings will improve, Ability to disclose and discuss suicidal ideas, Ability to demonstrate self-control will improve, and Ability to identify and develop effective coping behaviors will improve   Physician Treatment Plan for Secondary Diagnosis: Principal Problem:   MDD (major depressive disorder), recurrent episode (HCC)   Long Term Goal(s): Improvement in symptoms so as ready for discharge   Short Term Goals: Ability to identify changes in lifestyle to reduce recurrence of condition will improve, Ability to verbalize feelings will improve, Ability to disclose and discuss suicidal ideas, Ability to demonstrate self-control will improve, Ability to identify and develop effective coping behaviors will improve, Ability to maintain clinical measurements  within normal limits will improve, Compliance with prescribed medications will improve, and Ability to identify triggers associated with substance abuse/mental health issues will improve    Verner Chol, MD 02/17/2024, 9:37 AM

## 2024-02-18 DIAGNOSIS — F332 Major depressive disorder, recurrent severe without psychotic features: Secondary | ICD-10-CM | POA: Diagnosis not present

## 2024-02-18 LAB — LIPID PANEL
Cholesterol: 162 mg/dL (ref 0–200)
HDL: 49 mg/dL (ref >40)
LDL Cholesterol: 96 mg/dL (ref 0–99)
Total CHOL/HDL Ratio: 3.3 ratio
Triglycerides: 85 mg/dL (ref <150)
VLDL: 17 mg/dL (ref 0–40)

## 2024-02-18 LAB — HEMOGLOBIN A1C
Hgb A1c MFr Bld: 5.2 % (ref 4.8–5.6)
Mean Plasma Glucose: 102.54 mg/dL

## 2024-02-18 MED ORDER — DIPHENHYDRAMINE HCL 25 MG PO CAPS: 25.0000 mg | ORAL_CAPSULE | Freq: Four times a day (QID) | ORAL | Status: DC | PRN

## 2024-02-18 NOTE — Group Note (Signed)
 Date:  02/18/2024 Time:  10:50 AM  Group Topic/Focus:  Overcoming Stress:   The focus of this group is to define stress and help patients assess their triggers.    Participation Level:  Did Not Attend   Ardelle Anton 02/18/2024, 10:50 AM

## 2024-02-18 NOTE — Plan of Care (Signed)
   Problem: Activity: Goal: Interest or engagement in leisure activities will improve Outcome: Progressing Goal: Imbalance in normal sleep/wake cycle will improve Outcome: Progressing

## 2024-02-18 NOTE — Group Note (Signed)
 Therapy Group Note  Group Topic:Other Neurographic Art Group Date: 02/18/2024 Start Time: 1300 End Time: 1350 Facilitators: Lottie Mussel, OT    Group Description: Group participated with Neurographic art activity, using watercolor paints to facilitate creative expression and meditation/relaxation for each individual.  Incorporated bimanual coordination, mental focus, emotional processing, task/command following and relaxation techniques as appropriate.  Patients engaged socially with therapist and other group participants throughout session. Allowed to ask questions as appropriate, and encouraged to identify ways they could use/share their creations with themselves and others.   Therapeutic Goal(s): Demonstrate ability to independently manipulate utensils required to participate with and complete activity. Demonstrate ability to cognitively focus on task and follow commands necessary for completion. Demonstrate use of art as an outlet for emotional processing and expression. Identify and demonstrate importance of relaxation, neural calming and meditation for improved participation with life groups.   Individual Participation: Pt came in at end of session. Shares that she was eager to participate next time.     Participation Level: Did not attend but eager to participate in the future.  Participation Quality:   Behavior:   Speech/Thought Process:   Affect/Mood:   Insight:   Judgement:   Individualization:   Modes of Intervention:   Patient Response to Interventions:    Plan: Continue to engage patient in OT groups 1-2x/week.  Arman Filter., MPH, MS, OTR/L ascom [phone] 02/18/24, 2:50 PM

## 2024-02-18 NOTE — Plan of Care (Signed)
   02/18/24 0300  Psych Admission Type (Psych Patients Only)  Admission Status Voluntary  Psychosocial Assessment  Patient Complaints Anxiety  Eye Contact Fair  Facial Expression Anxious  Affect Anxious  Speech Logical/coherent  Interaction Minimal  Motor Activity Slow  Appearance/Hygiene Unremarkable  Behavior Characteristics Cooperative  Mood Euthymic  Thought Process  Coherency WDL  Content WDL  Delusions None reported or observed  Perception WDL  Hallucination None reported or observed  Judgment WDL  Confusion None  Danger to Self  Current suicidal ideation? Denies  Danger to Others  Danger to Others None reported or observed  Danger to Others Abnormal  Harmful Behavior to others No threats or harm toward other people    Problem: Education: Goal: Utilization of techniques to improve thought processes will improve Outcome: Progressing Goal: Knowledge of the prescribed therapeutic regimen will improve Outcome: Progressing   Problem: Activity: Goal: Interest or engagement in leisure activities will improve Outcome: Progressing Goal: Imbalance in normal sleep/wake cycle will improve Outcome: Progressing

## 2024-02-18 NOTE — Progress Notes (Signed)
   02/18/24 1100  Psych Admission Type (Psych Patients Only)  Admission Status Voluntary  Psychosocial Assessment  Patient Complaints Anxiety  Eye Contact Fair  Facial Expression Anxious  Affect Anxious  Speech Logical/coherent  Interaction Minimal  Motor Activity Slow  Appearance/Hygiene Other (Comment) (appropriate)  Behavior Characteristics Cooperative  Mood Anxious  Thought Process  Coherency WDL  Content WDL  Delusions None reported or observed  Perception WDL  Hallucination None reported or observed  Judgment WDL  Confusion None  Danger to Self  Current suicidal ideation? Denies  Agreement Not to Harm Self Yes  Description of Agreement verbal  Danger to Others  Danger to Others None reported or observed  Danger to Others Abnormal  Harmful Behavior to others No threats or harm toward other people  Destructive Behavior No threats or harm toward property

## 2024-02-18 NOTE — Progress Notes (Signed)
 [facility] MD Progress Note  02/18/2024 4:31 PM Stephanie Riley  MRN:  [id number]  Stephanie Riley, 73 y/o female with history of alcohol abuse, GAD, MDD, substance-induced mood disorder, insomnia, adjustment disorder and ADHD, presented to [facility] voluntarily. Per the patient she is having a nervous breakdown because her husband and her was going through a divorce and she found out that he divorced her without she knowing. According to patient they have been together for 50 years. Patient stated that she does see a psychiatrist here at the walk-in clinic.  Subjective:  Chart reviewed, case discussed in multidisciplinary meeting, patient seen during rounds.  Patient is noted to be writing some letters to her family.  She is noted to be in good mood.  She wanted this provider to read those letters.  Those letters involved topics about her alcohol use, her lack of insight into the problem with alcohol and benzos, her current motivation to get help and do better for herself.  She denies current SI/HI/intent/plan.  She denies auditory/visual hallucinations.  She is taking her medications with no reported side effects.  She is tolerating gabapentin very well and she has completed Librium taper.  Sleep: Fair  Appetite:  Fair  Past Psychiatric History: see h&P Family History: History reviewed. No pertinent family history. Social History:  Social History   Substance and Sexual Activity  Alcohol Use Yes     Social History   Substance and Sexual Activity  Drug Use Never    Social History   Socioeconomic History   Marital status: Married    Spouse name: Not on file   Number of children: Not on file   Years of education: Not on file   Highest education level: Not on file  Occupational History   Not on file  Tobacco Use   Smoking status: Never   Smokeless tobacco: Never  Substance and Sexual Activity   Alcohol use: Yes   Drug use: Never   Sexual activity: Not Currently  Other Topics Concern    Not on file  Social History Narrative   Not on file   Social Drivers of Health   Financial Resource Strain: Low Risk  (10/19/2022)   Received from [facility], [facility]   Overall Financial Resource Strain (CARDIA)    Difficulty of Paying Living Expenses: Not hard at all  Food Insecurity: No Food Insecurity (02/12/2024)   Hunger Vital Sign    Worried About Running Out of Food in the Last Year: Never true    Ran Out of Food in the Last Year: Never true  Transportation Needs: No Transportation Needs (02/12/2024)   PRAPARE - Administrator, Civil Service (Medical): No    Lack of Transportation (Non-Medical): No  Physical Activity: Insufficiently Active (10/19/2022)   Received from [facility]   Exercise Vital Sign    Days of Exercise per Week: 3 days    Minutes of Exercise per Session: 20 min  Stress: No Stress Concern Present (10/19/2022)   Received from [facility] Occup[facility] - Occupational Stress Questionnaire    Feeling of Stress : Not at all  Social Connections: Moderately Isolated (02/12/2024)   Social Connection and Isolation Panel [NHANES]    Frequency of Communication with Friends and Family: Once a week    Frequency of Social Gatherings with Friends and Family: Once a week    Attends Religious Services: 1 to 4 times per year  Active Member of Clubs or Organizations: No    Attends Banker Meetings: 1 to 4 times per year    Marital Status: Divorced   Past Medical History:  Past Medical History:  Diagnosis Date   Alcohol abuse    Benzodiazepine withdrawal without complication (HCC) 02/12/2023   HTN (hypertension)    History reviewed. No pertinent surgical history.  Current Medications: Current Facility-Administered Medications  Medication Dose Route Frequency Provider Last Rate Last Admin   alum & mag hydroxide-simeth (MAALOX/MYLANTA) 200-200-20 MG/5ML suspension 30 mL  30 mL  Oral Q4H PRN Oneta Rack, NP       amLODipine (NORVASC) tablet 10 mg  10 mg Oral Daily Oneta Rack, NP   10 mg at 02/18/24 2841   ARIPiprazole (ABILIFY) tablet 5 mg  5 mg Oral QHS Verner Chol, MD   5 mg at 02/17/24 2110   busPIRone (BUSPAR) tablet 15 mg  15 mg Oral TID Oneta Rack, NP   15 mg at 02/18/24 3244   doxepin (SINEQUAN) capsule 50 mg  50 mg Oral QHS Verner Chol, MD   50 mg at 02/17/24 2111   gabapentin (NEURONTIN) capsule 100 mg  100 mg Oral TID Verner Chol, MD   100 mg at 02/18/24 0102   ibuprofen (ADVIL) tablet 400 mg  400 mg Oral Q6H PRN Verner Chol, MD   400 mg at 02/17/24 1223   mirtazapine (REMERON SOL-TAB) disintegrating tablet 45 mg  45 mg Oral QHS Bobbitt, Shalon E, NP   45 mg at 02/17/24 2112   multivitamin with minerals tablet 1 tablet  1 tablet Oral Daily Oneta Rack, NP   1 tablet at 02/18/24 7253   sertraline (ZOLOFT) tablet 200 mg  200 mg Oral Daily Oneta Rack, NP   200 mg at 02/18/24 0920   thiamine (VITAMIN B1) tablet 100 mg  100 mg Oral Daily Oneta Rack, NP   100 mg at 02/18/24 6644   traZODone (DESYREL) tablet 100 mg  100 mg Oral QHS Verner Chol, MD   100 mg at 02/17/24 2111   traZODone (DESYREL) tablet 50 mg  50 mg Oral QHS PRN Verner Chol, MD   50 mg at 02/16/24 2337    Lab Results:  Results for orders placed or performed during the hospital encounter of 02/12/24 (from the past 48 hours)  Lipid panel     Status: None   Collection Time: 02/18/24  6:56 AM  Result Value Ref Range   Cholesterol 162 0 - 200 mg/dL   Triglycerides 85 <034 mg/dL   HDL 49 >74 mg/dL   Total CHOL/HDL Ratio 3.3 RATIO   VLDL 17 0 - 40 mg/dL   LDL Cholesterol 96 0 - 99 mg/dL    Comment:        Total Cholesterol/HDL:CHD Risk Coronary Heart Disease Risk Table                     Men   Women  1/2 Average Risk   3.4   3.3  Average Risk       5.0   4.4  2 X Average Risk   9.6   7.1  3 X Average Risk  23.4   11.0        Use the calculated  Patient Ratio above and the CHD Risk Table to determine the patient's CHD Risk.        ATP III CLASSIFICATION (LDL):  <100  mg/dL   Optimal  161-096  mg/dL   Near or Above                    Optimal  130-159  mg/dL   Borderline  045-409  mg/dL   High  >811     mg/dL   Very High Performed at [facility] [address] [place], [place] [address]   Hemoglobin A1c     Status: None   Collection Time: 02/18/24  6:56 AM  Result Value Ref Range   Hgb A1c MFr Bld 5.2 4.8 - 5.6 %    Comment: (NOTE) Pre diabetes:          5.7%-6.4%  Diabetes:              >6.4%  Glycemic control for   <7.0% adults with diabetes    Mean Plasma Glucose 102.54 mg/dL    Comment: Performed at [facility], [address], [place], [place] [address]     Blood Alcohol level:  Lab Results  Component Value Date   ETH <10 02/10/2024   ETH 215 (H) 09/03/2023    Metabolic Disorder Labs: Lab Results  Component Value Date   HGBA1C 5.2 02/18/2024   MPG 102.54 02/18/2024   MPG 93.93 05/25/2023   No results found for: "PROLACTIN" Lab Results  Component Value Date   CHOL 162 02/18/2024   TRIG 85 02/18/2024   HDL 49 02/18/2024   CHOLHDL 3.3 02/18/2024   VLDL 17 02/18/2024   LDLCALC 96 02/18/2024   LDLCALC 41 05/25/2023    Physical Findings: AIMS:  , ,  ,  ,    CIWA:  CIWA-Ar Total: 0 COWS:      Psychiatric Specialty Exam:  Presentation  General Appearance:  Appropriate for Environment; Casual  Eye Contact: Fair  Speech: Clear and Coherent  Speech Volume: Normal    Mood and Affect  Mood: Anxious; Hopeless  Affect: Depressed   Thought Process  Thought Processes: Coherent  Descriptions of Associations:Intact  Orientation:Full (Time, Place and Person)  Thought Content:Logical  Hallucinations:Hallucinations: None  Ideas of Reference:None  Suicidal Thoughts:Suicidal Thoughts: No  Homicidal Thoughts:Homicidal Thoughts: No   Sensorium   Memory: Immediate Fair; Recent Fair; Remote Fair  Judgment: Impaired  Insight: Shallow   Executive Functions  Concentration: Fair  Attention Span: Fair  Recall: Fair  Fund of Knowledge: Fair  Language: Fair   Psychomotor Activity  Psychomotor Activity: Psychomotor Activity: Normal  Musculoskeletal: Strength & Muscle Tone: within normal limits Gait & Station: normal Assets  Assets: Manufacturing systems engineer; Desire for Improvement; Resilience    Physical Exam: Physical Exam Vitals and nursing note reviewed.  HENT:     Head: Normocephalic.     Right Ear: Tympanic membrane normal.     Nose: Nose normal.     Mouth/Throat:     Mouth: Mucous membranes are moist.  Eyes:     Pupils: Pupils are equal, round, and reactive to light.  Cardiovascular:     Rate and Rhythm: Normal rate.  Pulmonary:     Breath sounds: Normal breath sounds.  Abdominal:     General: Bowel sounds are normal.  Skin:    General: Skin is warm.  Neurological:     General: No focal deficit present.     Mental Status: She is alert.    Review of Systems  Constitutional: Negative.   HENT: Negative.    Eyes: Negative.   Respiratory: Negative.  Cardiovascular: Negative.   Gastrointestinal: Negative.   Skin: Negative.   Neurological: Negative.    Blood pressure 132/84, pulse 88, temperature 98.3 F (36.8 C), resp. rate 18, height 5\' 6"  (1.676 m), weight 68.9 kg, SpO2 95%. Body mass index is 24.53 kg/m.  Diagnosis: Principal Problem:   MDD (major depressive disorder), recurrent episode (HCC) Sedative-hypnotic use disorder, severe Alcohol use disorder, moderate  Clinical Decision Making: Patient with depression and anxiety with chronic alcohol use admitted for inpatient hospital.  She reports feeling depressed and anxious in the context of getting divorced recently.  She is requesting for Ativan to detox from alcohol.  She denies SI/HI/plan.  Discussed Librium taper for detox.   Patient needs inpatient psychiatric hospitalization for stabilization   Treatment Plan Summary:   Safety and Monitoring:             -- Voluntary admission to inpatient psychiatric unit for safety, stabilization and treatment             -- Daily contact with patient to assess and evaluate symptoms and progress in treatment             -- Patient's case to be discussed in multi-disciplinary team meeting             -- Observation Level: q15 minute checks             -- Vital signs:  q12 hours             -- Precautions: suicide, elopement, and assault   2. Psychiatric Diagnoses and Treatment:  Librium Taper completed Gabapentin 100mg  TID to help with chronic alcoholism and anxiety    Abilify 5 mg nightly to help with mood stabilization and treatment resistant depression, doxepin 50 mg nightly to help with her sleep.  Patient gave consent to the medications and risk benefits were discussed in thorough       Continue her home medications-Zoloft 200 mg daily, Remeron 45 mg nightly, trazodone 100 mg nightly and 50 mg as needed  Doxepin 50 mg at bedtime for insomnia -- The risks/benefits/side-effects/alternatives to this medication were discussed in detail with the patient and time was given for questions. The patient consents to medication trial.                -- Metabolic profile and EKG monitoring obtained while on an atypical antipsychotic (BMI: Lipid Panel: HbgA1c: QTc:)              -- Encouraged patient to participate in unit milieu and in scheduled group therapies                            3. Medical Issues Being Addressed:   Alcohol detox with Librium    4. Discharge Planning:              -- Social work and case management to assist with discharge planning and identification of hospital follow-up needs prior to discharge             -- Estimated LOS: 5-7 days             -- Discharge Concerns: Need to establish a safety plan; Medication compliance and effectiveness              -- Discharge Goals: Return home with outpatient referrals follow ups   Physician Treatment Plan for Primary Diagnosis: MDD (major depressive disorder), recurrent episode (HCC) Long Term Goal(s):  Improvement in symptoms so as ready for discharge   Short Term Goals: Ability to identify changes in lifestyle to reduce recurrence of condition will improve, Ability to verbalize feelings will improve, Ability to disclose and discuss suicidal ideas, Ability to demonstrate self-control will improve, and Ability to identify and develop effective coping behaviors will improve   Physician Treatment Plan for Secondary Diagnosis: Principal Problem:   MDD (major depressive disorder), recurrent episode (HCC)   Long Term Goal(s): Improvement in symptoms so as ready for discharge   Short Term Goals: Ability to identify changes in lifestyle to reduce recurrence of condition will improve, Ability to verbalize feelings will improve, Ability to disclose and discuss suicidal ideas, Ability to demonstrate self-control will improve, Ability to identify and develop effective coping behaviors will improve, Ability to maintain clinical measurements within normal limits will improve, Compliance with prescribed medications will improve, and Ability to identify triggers associated with substance abuse/mental health issues will improve    Verner Chol, MD 02/18/2024, 4:31 PM

## 2024-02-18 NOTE — Group Note (Signed)
 Date:  02/18/2024 Time:  10:12 PM  Group Topic/Focus:  Goals Group:   The focus of this group is to help patients establish daily goals to achieve during treatment and discuss how the patient can incorporate goal setting into their daily lives to aide in recovery.    Participation Level:  Active  Participation Quality:  Appropriate and Attentive  Affect:  Appropriate  Cognitive:  Alert and Appropriate  Insight: Appropriate and Good  Engagement in Group:  Developing/Improving and Engaged  Modes of Intervention:  Discussion, Rapport Building, Socialization, and Support  Additional Comments:     Jun Rightmyer 02/18/2024, 10:12 PM

## 2024-02-19 DIAGNOSIS — F332 Major depressive disorder, recurrent severe without psychotic features: Secondary | ICD-10-CM | POA: Diagnosis not present

## 2024-02-19 NOTE — Plan of Care (Signed)
  Problem: Activity: Goal: Interest or engagement in leisure activities will improve Outcome: Progressing   Problem: Coping: Goal: Coping ability will improve Outcome: Progressing   

## 2024-02-19 NOTE — Progress Notes (Signed)
 [facility] MD Progress Note  02/19/2024 11:18 AM Stephanie Riley  MRN:  [id number]  Stephanie Riley, 73 y/o female with history of alcohol abuse, GAD, MDD, substance-induced mood disorder, insomnia, adjustment disorder and ADHD, presented to [facility] voluntarily. Per the patient she is having a nervous breakdown because her husband and her was going through a divorce and she found out that he divorced her without she knowing. According to patient they have been together for 50 years. Patient stated that she does see a psychiatrist here at the walk-in clinic.   Subjective:  Chart reviewed, case discussed in multidisciplinary meeting, patient seen during rounds.  Patient is noted to be resting in her room.  She reports that another peer continues to talk throughout the day and she wanted a break.  She reports feeling better.  She talked about her previous problems with alcohol, Klonopin use for 25 years.  She talked about her plan to go to a long-term rehab.  Provider and patient discussed about divorce and the emotions behind it.  She denies SI/HI/intent/plan.  She denies auditory/visual hallucinations.  Sleep: Fair  Appetite:  Fair  Past Psychiatric History: see h&P Family History: History reviewed. No pertinent family history. Social History:  Social History   Substance and Sexual Activity  Alcohol Use Yes     Social History   Substance and Sexual Activity  Drug Use Never    Social History   Socioeconomic History   Marital status: Married    Spouse name: Not on file   Number of children: Not on file   Years of education: Not on file   Highest education level: Not on file  Occupational History   Not on file  Tobacco Use   Smoking status: Never   Smokeless tobacco: Never  Substance and Sexual Activity   Alcohol use: Yes   Drug use: Never   Sexual activity: Not Currently  Other Topics Concern   Not on file  Social History Narrative   Not on file   Social Drivers of Health    Financial Resource Strain: Low Risk  (10/19/2022)   Received from [facility], [facility]   Overall Financial Resource Strain (CARDIA)    Difficulty of Paying Living Expenses: Not hard at all  Food Insecurity: No Food Insecurity (02/12/2024)   Hunger Vital Sign    Worried About Running Out of Food in the Last Year: Never true    Ran Out of Food in the Last Year: Never true  Transportation Needs: No Transportation Needs (02/12/2024)   PRAPARE - Administrator, Civil Service (Medical): No    Lack of Transportation (Non-Medical): No  Physical Activity: Insufficiently Active (10/19/2022)   Received from [facility], [facility]   Exercise Vital Sign    Days of Exercise per Week: 3 days    Minutes of Exercise per Session: 20 min  Stress: No Stress Concern Present (10/19/2022)   Received from [facility], [facility] of Occupational Health - Occupational Stress Questionnaire    Feeling of Stress : Not at all  Social Connections: Moderately Isolated (02/12/2024)   Social Connection and Isolation Panel [NHANES]    Frequency of Communication with Friends and Family: Once a week    Frequency of Social Gatherings with Friends and Family: Once a week    Attends Religious Services: 1 to 4 times per year    Active Member of [employer] or Organizations: No    Attends Club or  Organization Meetings: 1 to 4 times per year    Marital Status: Divorced   Past Medical History:  Past Medical History:  Diagnosis Date   Alcohol abuse    Benzodiazepine withdrawal without complication (HCC) 02/12/2023   HTN (hypertension)    History reviewed. No pertinent surgical history.  Current Medications: Current Facility-Administered Medications  Medication Dose Route Frequency Provider Last Rate Last Admin   alum & mag hydroxide-simeth (MAALOX/MYLANTA) 200-200-20 MG/5ML suspension 30 mL  30 mL Oral Q4H PRN Oneta Rack, NP       amLODipine (NORVASC) tablet 10 mg  10 mg  Oral Daily Oneta Rack, NP   10 mg at 02/19/24 0920   ARIPiprazole (ABILIFY) tablet 5 mg  5 mg Oral QHS Verner Chol, MD   5 mg at 02/18/24 2110   busPIRone (BUSPAR) tablet 15 mg  15 mg Oral TID Oneta Rack, NP   15 mg at 02/19/24 0920   diphenhydrAMINE (BENADRYL) capsule 25 mg  25 mg Oral Q6H PRN Verner Chol, MD       doxepin (SINEQUAN) capsule 50 mg  50 mg Oral QHS Verner Chol, MD   50 mg at 02/18/24 2109   gabapentin (NEURONTIN) capsule 100 mg  100 mg Oral TID Verner Chol, MD   100 mg at 02/19/24 0920   ibuprofen (ADVIL) tablet 400 mg  400 mg Oral Q6H PRN Verner Chol, MD   400 mg at 02/17/24 1223   mirtazapine (REMERON SOL-TAB) disintegrating tablet 45 mg  45 mg Oral QHS Bobbitt, Shalon E, NP   45 mg at 02/18/24 2113   multivitamin with minerals tablet 1 tablet  1 tablet Oral Daily Oneta Rack, NP   1 tablet at 02/19/24 0920   sertraline (ZOLOFT) tablet 200 mg  200 mg Oral Daily Oneta Rack, NP   200 mg at 02/19/24 0920   thiamine (VITAMIN B1) tablet 100 mg  100 mg Oral Daily Oneta Rack, NP   100 mg at 02/19/24 0920   traZODone (DESYREL) tablet 100 mg  100 mg Oral QHS Verner Chol, MD   100 mg at 02/18/24 2110   traZODone (DESYREL) tablet 50 mg  50 mg Oral QHS PRN Verner Chol, MD   50 mg at 02/19/24 0400    Lab Results:  Results for orders placed or performed during the hospital encounter of 02/12/24 (from the past 48 hours)  Lipid panel     Status: None   Collection Time: 02/18/24  6:56 AM  Result Value Ref Range   Cholesterol 162 0 - 200 mg/dL   Triglycerides 85 <409 mg/dL   HDL 49 >81 mg/dL   Total CHOL/HDL Ratio 3.3 RATIO   VLDL 17 0 - 40 mg/dL   LDL Cholesterol 96 0 - 99 mg/dL    Comment:        Total Cholesterol/HDL:CHD Risk Coronary Heart Disease Risk Table                     Men   Women  1/2 Average Risk   3.4   3.3  Average Risk       5.0   4.4  2 X Average Risk   9.6   7.1  3 X Average Risk  23.4   11.0        Use the  calculated Patient Ratio above and the CHD Risk Table to determine the patient's CHD Risk.        ATP  III CLASSIFICATION (LDL):  <100     mg/dL   Optimal  098-119  mg/dL   Near or Above                    Optimal  130-159  mg/dL   Borderline  147-829  mg/dL   High  >562     mg/dL   Very High Performed at [facility], [address] [place], [place] [address]   Hemoglobin A1c     Status: None   Collection Time: 02/18/24  6:56 AM  Result Value Ref Range   Hgb A1c MFr Bld 5.2 4.8 - 5.6 %    Comment: (NOTE) Pre diabetes:          5.7%-6.4%  Diabetes:              >6.4%  Glycemic control for   <7.0% adults with diabetes    Mean Plasma Glucose 102.54 mg/dL    Comment: Performed at [facility], [address], [place], [place] [address]     Blood Alcohol level:  Lab Results  Component Value Date   ETH <10 02/10/2024   ETH 215 (H) 09/03/2023    Metabolic Disorder Labs: Lab Results  Component Value Date   HGBA1C 5.2 02/18/2024   MPG 102.54 02/18/2024   MPG 93.93 05/25/2023   No results found for: "PROLACTIN" Lab Results  Component Value Date   CHOL 162 02/18/2024   TRIG 85 02/18/2024   HDL 49 02/18/2024   CHOLHDL 3.3 02/18/2024   VLDL 17 02/18/2024   LDLCALC 96 02/18/2024   LDLCALC 41 05/25/2023    Physical Findings: AIMS:  , ,  ,  ,    CIWA:  CIWA-Ar Total: 0 COWS:      Psychiatric Specialty Exam:  Presentation  General Appearance:  Appropriate for Environment; Casual  Eye Contact: Fair  Speech: Clear and Coherent  Speech Volume: Normal    Mood and Affect  Mood: Anxious; Hopeless  Affect: Depressed   Thought Process  Thought Processes: Coherent  Descriptions of Associations:Intact  Orientation:Full (Time, Place and Person)  Thought Content:Logical  Hallucinations:Hallucinations: None  Ideas of Reference:None  Suicidal Thoughts:Suicidal Thoughts: No  Homicidal Thoughts:Homicidal Thoughts:  No   Sensorium  Memory: Immediate Fair; Recent Fair; Remote Fair  Judgment: Impaired  Insight: Shallow   Executive Functions  Concentration: Fair  Attention Span: Fair  Recall: Fair  Fund of Knowledge: Fair  Language: Fair   Psychomotor Activity  Psychomotor Activity: Psychomotor Activity: Normal  Musculoskeletal: Strength & Muscle Tone: within normal limits Gait & Station: normal Assets  Assets: Manufacturing systems engineer; Financial Resources/Insurance; Social Support    Physical Exam: Physical Exam Vitals and nursing note reviewed.  HENT:     Head: Normocephalic.     Right Ear: Tympanic membrane normal.     Nose: Nose normal.     Mouth/Throat:     Mouth: Mucous membranes are moist.  Eyes:     Pupils: Pupils are equal, round, and reactive to light.  Cardiovascular:     Rate and Rhythm: Normal rate.  Pulmonary:     Breath sounds: Normal breath sounds.  Abdominal:     General: Bowel sounds are normal.  Skin:    General: Skin is warm.  Neurological:     General: No focal deficit present.     Mental Status: She is alert.    Review of Systems  Constitutional: Negative.   HENT: Negative.  Eyes: Negative.   Respiratory: Negative.    Cardiovascular: Negative.   Gastrointestinal: Negative.   Skin: Negative.   Neurological: Negative.    Blood pressure 114/74, pulse 78, temperature (!) 97.5 F (36.4 C), resp. rate 18, height 5\' 6"  (1.676 m), weight 68.9 kg, SpO2 94%. Body mass index is 24.53 kg/m.  Diagnosis: Principal Problem:   MDD (major depressive disorder), recurrent episode (HCC) Sedative-hypnotic use disorder, severe Alcohol use disorder, moderate  Clinical Decision Making: Patient with depression and anxiety with chronic alcohol use admitted for inpatient hospital.  She reports feeling depressed and anxious in the context of getting divorced recently.  She is requesting for Ativan to detox from alcohol.  She denies SI/HI/plan.   Discussed Librium taper for detox.  Patient needs inpatient psychiatric hospitalization for stabilization   Treatment Plan Summary:   Safety and Monitoring:             -- Voluntary admission to inpatient psychiatric unit for safety, stabilization and treatment             -- Daily contact with patient to assess and evaluate symptoms and progress in treatment             -- Patient's case to be discussed in multi-disciplinary team meeting             -- Observation Level: q15 minute checks             -- Vital signs:  q12 hours             -- Precautions: suicide, elopement, and assault   2. Psychiatric Diagnoses and Treatment:  Librium Taper completed Gabapentin 100mg  TID to help with chronic alcoholism and anxiety    Abilify 5 mg nightly to help with mood stabilization and treatment resistant depression, doxepin 50 mg nightly to help with her sleep.  Patient gave consent to the medications and risk benefits were discussed in thorough       Continue her home medications-Zoloft 200 mg daily, Remeron 45 mg nightly, trazodone 100 mg nightly and 50 mg as needed  Doxepin 50 mg at bedtime for insomnia -- The risks/benefits/side-effects/alternatives to this medication were discussed in detail with the patient and time was given for questions. The patient consents to medication trial.                -- Metabolic profile and EKG monitoring obtained while on an atypical antipsychotic (BMI: Lipid Panel: HbgA1c: QTc:)              -- Encouraged patient to participate in unit milieu and in scheduled group therapies                            3. Medical Issues Being Addressed:   Alcohol detox with Librium    4. Discharge Planning:              -- Social work and case management to assist with discharge planning and identification of hospital follow-up needs prior to discharge             -- Estimated LOS: 5-7 days             -- Discharge Concerns: Need to establish a safety plan; Medication  compliance and effectiveness             -- Discharge Goals: Return home with outpatient referrals follow ups   Physician Treatment Plan for Primary Diagnosis:  MDD (major depressive disorder), recurrent episode (HCC) Long Term Goal(s): Improvement in symptoms so as ready for discharge   Short Term Goals: Ability to identify changes in lifestyle to reduce recurrence of condition will improve, Ability to verbalize feelings will improve, Ability to disclose and discuss suicidal ideas, Ability to demonstrate self-control will improve, and Ability to identify and develop effective coping behaviors will improve   Physician Treatment Plan for Secondary Diagnosis: Principal Problem:   MDD (major depressive disorder), recurrent episode (HCC)   Long Term Goal(s): Improvement in symptoms so as ready for discharge   Short Term Goals: Ability to identify changes in lifestyle to reduce recurrence of condition will improve, Ability to verbalize feelings will improve, Ability to disclose and discuss suicidal ideas, Ability to demonstrate self-control will improve, Ability to identify and develop effective coping behaviors will improve, Ability to maintain clinical measurements within normal limits will improve, Compliance with prescribed medications will improve, and Ability to identify triggers associated with substance abuse/mental health issues will improve    Verner Chol, MD 02/19/2024, 11:18 AM

## 2024-02-19 NOTE — BH IP Treatment Plan (Signed)
 Interdisciplinary Treatment and Diagnostic Plan Update  02/19/2024 Time of Session: 10:50 am ALLURE GREASER MRN: [id number]  Principal Diagnosis: MDD (major depressive disorder), recurrent episode (HCC)  Secondary Diagnoses: Principal Problem:   MDD (major depressive disorder), recurrent episode (HCC)   Current Medications:  Current Facility-Administered Medications  Medication Dose Route Frequency Provider Last Rate Last Admin   alum & mag hydroxide-simeth (MAALOX/MYLANTA) 200-200-20 MG/5ML suspension 30 mL  30 mL Oral Q4H PRN Oneta Rack, NP       amLODipine (NORVASC) tablet 10 mg  10 mg Oral Daily Oneta Rack, NP   10 mg at 02/19/24 0920   ARIPiprazole (ABILIFY) tablet 5 mg  5 mg Oral QHS Verner Chol, MD   5 mg at 02/18/24 2110   busPIRone (BUSPAR) tablet 15 mg  15 mg Oral TID Oneta Rack, NP   15 mg at 02/19/24 0920   diphenhydrAMINE (BENADRYL) capsule 25 mg  25 mg Oral Q6H PRN Verner Chol, MD       doxepin (SINEQUAN) capsule 50 mg  50 mg Oral QHS Verner Chol, MD   50 mg at 02/18/24 2109   gabapentin (NEURONTIN) capsule 100 mg  100 mg Oral TID Verner Chol, MD   100 mg at 02/19/24 0920   ibuprofen (ADVIL) tablet 400 mg  400 mg Oral Q6H PRN Verner Chol, MD   400 mg at 02/17/24 1223   mirtazapine (REMERON SOL-TAB) disintegrating tablet 45 mg  45 mg Oral QHS Bobbitt, Shalon E, NP   45 mg at 02/18/24 2113   multivitamin with minerals tablet 1 tablet  1 tablet Oral Daily Oneta Rack, NP   1 tablet at 02/19/24 0920   sertraline (ZOLOFT) tablet 200 mg  200 mg Oral Daily Oneta Rack, NP   200 mg at 02/19/24 0920   thiamine (VITAMIN B1) tablet 100 mg  100 mg Oral Daily Oneta Rack, NP   100 mg at 02/19/24 0920   traZODone (DESYREL) tablet 100 mg  100 mg Oral QHS Verner Chol, MD   100 mg at 02/18/24 2110   traZODone (DESYREL) tablet 50 mg  50 mg Oral QHS PRN Verner Chol, MD   50 mg at 02/19/24 0400   PTA Medications: Medications Prior to  Admission  Medication Sig Dispense Refill Last Dose/Taking   amLODipine (NORVASC) 10 MG tablet Take 1 tablet (10 mg total) by mouth daily. 30 tablet 0 02/12/2024   B Complex Vitamins (B COMPLEX PO) Take 1 tablet by mouth daily.   02/12/2024   busPIRone (BUSPAR) 15 MG tablet Take 1 tablet (15 mg total) by mouth 3 (three) times daily. Although insurance would like 90 day supply rx, to encourage compliance and appropriate use 30 day supplies are what provider will be writing. (Patient taking differently: Take 15 mg by mouth 3 (three) times daily.) 270 tablet 0 02/12/2024   mirtazapine (REMERON SOLTAB) 45 MG disintegrating tablet Take 1 tablet (45 mg total) by mouth at bedtime. 90 tablet 0 02/12/2024   sertraline (ZOLOFT) 100 MG tablet Take 2 tablets (200 mg total) by mouth daily. 180 tablet 0 02/12/2024   ferrous sulfate 325 (65 FE) MG tablet Take 325 mg by mouth every morning.      hydrOXYzine (VISTARIL) 25 MG capsule Take 1 capsule (25 mg total) by mouth 3 (three) times daily as needed. (Patient taking differently: Take 25 mg by mouth 3 (three) times daily.) 135 capsule 0    Multiple Vitamins-Minerals (ADULT GUMMY PO) Take  2 tablets by mouth daily.      naltrexone (DEPADE) 50 MG tablet Take 1 tablet by mouth daily.      traZODone (DESYREL) 100 MG tablet Take 1 tablet (100 mg total) by mouth at bedtime as needed for sleep. Can take with 150mg  Trazodone tablet. (Patient taking differently: Take 100 mg by mouth at bedtime.) 90 tablet 0    traZODone (DESYREL) 50 MG tablet Take 1 tablet (50 mg total) by mouth at bedtime. Can take with 100mg  trazodone tablet nightly. (Patient taking differently: Take 50 mg by mouth at bedtime as needed for sleep.) 90 tablet 0     Patient Stressors: Substance abuse    Patient Strengths: Motivation for treatment/growth   Treatment Modalities: Medication Management, Group therapy, Case management,  1 to 1 session with clinician, Psychoeducation, Recreational therapy.   Physician  Treatment Plan for Primary Diagnosis: MDD (major depressive disorder), recurrent episode (HCC) Long Term Goal(s): Improvement in symptoms so as ready for discharge   Short Term Goals: Ability to identify changes in lifestyle to reduce recurrence of condition will improve Ability to verbalize feelings will improve Ability to disclose and discuss suicidal ideas Ability to demonstrate self-control will improve Ability to identify and develop effective coping behaviors will improve Ability to maintain clinical measurements within normal limits will improve Compliance with prescribed medications will improve Ability to identify triggers associated with substance abuse/mental health issues will improve  Medication Management: Evaluate patient's response, side effects, and tolerance of medication regimen.  Therapeutic Interventions: 1 to 1 sessions, Unit Group sessions and Medication administration.  Evaluation of Outcomes: Progressing  Physician Treatment Plan for Secondary Diagnosis: Principal Problem:   MDD (major depressive disorder), recurrent episode (HCC)  Long Term Goal(s): Improvement in symptoms so as ready for discharge   Short Term Goals: Ability to identify changes in lifestyle to reduce recurrence of condition will improve Ability to verbalize feelings will improve Ability to disclose and discuss suicidal ideas Ability to demonstrate self-control will improve Ability to identify and develop effective coping behaviors will improve Ability to maintain clinical measurements within normal limits will improve Compliance with prescribed medications will improve Ability to identify triggers associated with substance abuse/mental health issues will improve     Medication Management: Evaluate patient's response, side effects, and tolerance of medication regimen.  Therapeutic Interventions: 1 to 1 sessions, Unit Group sessions and Medication administration.  Evaluation of Outcomes:  Progressing   RN Treatment Plan for Primary Diagnosis: MDD (major depressive disorder), recurrent episode (HCC) Long Term Goal(s): Knowledge of disease and therapeutic regimen to maintain health will improve  Short Term Goals: Ability to remain free from injury will improve, Ability to verbalize frustration and anger appropriately will improve, Ability to demonstrate self-control, Ability to participate in decision making will improve, Ability to verbalize feelings will improve, Ability to identify and develop effective coping behaviors will improve, and Compliance with prescribed medications will improve  Medication Management: RN will administer medications as ordered by provider, will assess and evaluate patient's response and provide education to patient for prescribed medication. RN will report any adverse and/or side effects to prescribing provider.  Therapeutic Interventions: 1 on 1 counseling sessions, Psychoeducation, Medication administration, Evaluate responses to treatment, Monitor vital signs and CBGs as ordered, Perform/monitor CIWA, COWS, AIMS and Fall Risk screenings as ordered, Perform wound care treatments as ordered.  Evaluation of Outcomes: Progressing   LCSW Treatment Plan for Primary Diagnosis: MDD (major depressive disorder), recurrent episode (HCC) Long Term Goal(s): Safe transition to  appropriate next level of care at discharge, Engage patient in therapeutic group addressing interpersonal concerns.  Short Term Goals: Engage patient in aftercare planning with referrals and resources, Increase social support, Increase ability to appropriately verbalize feelings, Increase emotional regulation, Facilitate acceptance of mental health diagnosis and concerns, Facilitate patient progression through stages of change regarding substance use diagnoses and concerns, Identify triggers associated with mental health/substance abuse issues, and Increase skills for wellness and  recovery  Therapeutic Interventions: Assess for all discharge needs, 1 to 1 time with Social worker, Explore available resources and support systems, Assess for adequacy in community support network, Educate family and significant other(s) on suicide prevention, Complete Psychosocial Assessment, Interpersonal group therapy.  Evaluation of Outcomes: Progressing   Progress in Treatment: Attending groups: Yes. and No. Participating in groups: Yes. and No. Taking medication as prescribed: Yes. Toleration medication: Yes. Family/Significant other contact made: Yes, individual(s) contacted:   Beverly Milch, daughter, [phone] Patient understands diagnosis: Yes. Discussing patient identified problems/goals with staff: Yes. Medical problems stabilized or resolved: Yes. Denies suicidal/homicidal ideation: Yes. Issues/concerns per patient self-inventory: No. Other: None  New problem(s) identified: No, Describe:  None identified Update 02/19/2024: No changes at this time.    New Short Term/Long Term Goal(s): elimination of symptoms of psychosis, medication management for mood stabilization; elimination of SI thoughts; development of comprehensive mental wellness plan.    Update 02/19/2024: No changes at this time.    Patient Goals:  " I think I need more help than this. I'm severely depressed, past anxious. I just need some medicine that will take this feeling away"    Update 02/19/2024: No changes at this time.    Discharge Plan or Barriers: CSW will assist with appropriate discharge planning   Update 02/19/2024: No changes at this time.    Reason for Continuation of Hospitalization: Anxiety Depression Medication stabilization   Estimated Length of Stay: 1 to 7 days Update 02/19/2024: TBD Last 3 [place] Suicide Severity Risk Score: Flowsheet Row Admission (Current) from 02/12/2024 in [facility] BE[facility] ED from 02/10/2024 in [facility] ED to  Hosp-Admission (Discharged) from 09/03/2023 in [place] LONG 4TH FLOOR PROGRESSIVE CARE AND UROLOGY  C-SSRS RISK CATEGORY No Risk No Risk No Risk       Last PHQ 2/9 Scores:    02/10/2024   10:32 PM 05/28/2023   10:53 AM 05/25/2023    5:15 PM  Depression screen PHQ 2/9  Decreased Interest 3 3 3   Down, Depressed, Hopeless 3 3 3   PHQ - 2 Score 6 6 6   Altered sleeping 3 0 1  Tired, decreased energy 3 0 2  Change in appetite 3 1 1   Feeling bad or failure about yourself  3 1 1   Trouble concentrating 3 1 2   Moving slowly or fidgety/restless 3 0 0  Suicidal thoughts  0 0  PHQ-9 Score 24 9 13   Difficult doing work/chores   Very difficult    Scribe for Treatment Team: Marshell Levan, LCSW 02/19/2024 10:49 AM

## 2024-02-19 NOTE — Progress Notes (Signed)
 Patient is pleasant and cooperative. Appropriate affect. Endorses anxiety and depression.  Denies SI/HI and AVH.  Denies pain.  Reports she slept well.    Compliant with scheduled medications. 15 min checks in place for safety.  Patent is present in the milieu.  Appropriate interaction with peers.

## 2024-02-20 DIAGNOSIS — F332 Major depressive disorder, recurrent severe without psychotic features: Secondary | ICD-10-CM | POA: Diagnosis not present

## 2024-02-20 MED ORDER — GABAPENTIN 300 MG PO CAPS
300.0000 mg | ORAL_CAPSULE | Freq: Every day | ORAL | Status: DC
  Administered 2024-02-20 – 2024-02-22 (×3): 300 mg via ORAL
  Filled 2024-02-20 (×3): qty 1

## 2024-02-20 MED ORDER — HYDROXYZINE HCL 25 MG PO TABS
25.0000 mg | ORAL_TABLET | Freq: Four times a day (QID) | ORAL | Status: DC | PRN
  Administered 2024-02-20 – 2024-02-23 (×6): 25 mg via ORAL
  Filled 2024-02-20 (×7): qty 1

## 2024-02-20 MED ORDER — GABAPENTIN 100 MG PO CAPS
100.0000 mg | ORAL_CAPSULE | ORAL | Status: DC
  Administered 2024-02-21 – 2024-02-23 (×6): 100 mg via ORAL
  Filled 2024-02-20 (×6): qty 1

## 2024-02-20 NOTE — Group Note (Signed)
 Date:  02/20/2024 Time:  3:04 PM  Group Topic/Focus:  Outside Rec/Group Movement The purpose of this group is to allow patients to go outside and get fresh air while listening to relaxing music.   Participation Level:  Active  Participation Quality:  Appropriate  Affect:  Appropriate  Cognitive:  Alert and Appropriate  Insight: Appropriate  Engagement in Group:  Engaged  Modes of Intervention:  Activity  Additional Comments:    Marta Antu 02/20/2024, 3:04 PM

## 2024-02-20 NOTE — Group Note (Signed)
 Date:  02/20/2024 Time:  9:29 PM  Group Topic/Focus:  Wrap-Up Group:   The focus of this group is to help patients review their daily goal of treatment and discuss progress on daily workbooks.    Participation Level:  Active  Participation Quality:  Appropriate  Affect:  Appropriate  Cognitive:  Appropriate  Insight: Good  Engagement in Group:  Engaged  Modes of Intervention:  Discussion  Additional Comments:    Stephanie Riley 02/20/2024, 9:29 PM

## 2024-02-20 NOTE — Progress Notes (Signed)
D: Pt alert and oriented. Pt rates depression 0/10 and anxiety 8/10.  Pt denies experiencing any SI/HI, or AVH at this time.   A: Scheduled medications administered to pt, per MD orders. Support and encouragement provided. Frequent verbal contact made. Routine safety checks conducted q15 minutes.   R: No adverse drug reactions noted. Pt verbally contracts for safety at this time. Pt complaint with medications and treatment plan. Pt interacts well with others on the unit. Pt remains safe at this time. Will continue to monitor.
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
## 2024-02-20 NOTE — Plan of Care (Signed)
  Problem: Activity: Goal: Interest or engagement in leisure activities will improve Outcome: Progressing   Problem: Health Behavior/Discharge Planning: Goal: Ability to make decisions will improve Outcome: Progressing Goal: Compliance with therapeutic regimen will improve Outcome: Progressing

## 2024-02-20 NOTE — Progress Notes (Signed)
 Patient is pleasant and cooperative.  Endorses anxiety and depression.  Denies SI/HI and AVH.  Denies pain.  Reports she slept well.    Compliant with scheduled medications.  15 min checks in place for safety.  Patient is present in the milieu.  Appropriate interaction with peers and staff.

## 2024-02-20 NOTE — Plan of Care (Signed)
   Problem: Education: Goal: Utilization of techniques to improve thought processes will improve Outcome: Progressing Goal: Knowledge of the prescribed therapeutic regimen will improve Outcome: Progressing

## 2024-02-21 DIAGNOSIS — F419 Anxiety disorder, unspecified: Secondary | ICD-10-CM

## 2024-02-21 DIAGNOSIS — F332 Major depressive disorder, recurrent severe without psychotic features: Secondary | ICD-10-CM | POA: Diagnosis not present

## 2024-02-21 NOTE — Progress Notes (Signed)
   02/21/24 0400  Psych Admission Type (Psych Patients Only)  Admission Status Voluntary  Psychosocial Assessment  Patient Complaints Anxiety;Depression  Eye Contact Fair  Facial Expression Animated  Affect Appropriate to circumstance  Speech Logical/coherent  Interaction Assertive  Motor Activity Slow  Appearance/Hygiene Unremarkable  Behavior Characteristics Cooperative;Appropriate to situation  Mood Pleasant  Aggressive Behavior  Effect No apparent injury  Thought Process  Coherency WDL  Content WDL  Delusions None reported or observed  Perception WDL  Hallucination None reported or observed  Judgment WDL  Confusion None  Danger to Self  Current suicidal ideation? Denies  Agreement Not to Harm Self Yes  Danger to Others  Danger to Others None reported or observed

## 2024-02-21 NOTE — Progress Notes (Signed)
   02/21/24 1500  Spiritual Encounters  Type of Visit Initial  Care provided to: Patient  Conversation partners present during encounter Nurse;Social worker/Care management/TOC  Referral source Patient request  Reason for visit Routine spiritual support  OnCall Visit No  Spiritual Framework  Presenting Themes Meaning/purpose/sources of inspiration;Goals in life/care;Impactful experiences and emotions;Courage hope and growth;Coping tools;Rituals and practive   Patient asked the chaplain to pray for her because she is having familial issues and needs to make decisions about where she will live. Chaplain prayed with her and offered her words of encouragement.

## 2024-02-21 NOTE — Progress Notes (Signed)
 [facility] MD Progress Note  02/21/2024 2:14 PM Stephanie Riley  MRN:  [id number]  Stephanie Riley, 73 y/o female with history of alcohol abuse, GAD, MDD, substance-induced mood disorder, insomnia, adjustment disorder and ADHD, presented to [facility] voluntarily. Per the patient she is having a nervous breakdown because her husband and her was going through a divorce and she found out that he divorced her without she knowing. According to patient they have been together for 50 years. Patient stated that she does see a psychiatrist here at the walk-in clinic.   Subjective:  Chart reviewed, case discussed in multidisciplinary meeting, patient seen during rounds.  During assessment patient reported her depression being 10/10 and her anxiety being 12/10.  Patient reports that she is anxious about going to a rehab facility.  She is contemplating going to rehab.  Patient also said that she was married for 46 years, now she is divorce which caused a lot of anxiety.  Patient was provided with support and reassurance.  Patient's medicines were adjusted yesterday.  She was started on hydroxyzine for anxiety.  Patient was also started on Abilify to augment the effect of antidepressants.  Patient is on BuSpar for anxiety.  Patient was encouraged to attend group and work on coping strategies.  Sleep: Fair  Appetite:  Fair  Past Psychiatric History: see h&P Family History: History reviewed. No pertinent family history. Social History:  Social History   Substance and Sexual Activity  Alcohol Use Yes     Social History   Substance and Sexual Activity  Drug Use Never    Social History   Socioeconomic History   Marital status: Married    Spouse name: Not on file   Number of children: Not on file   Years of education: Not on file   Highest education level: Not on file  Occupational History   Not on file  Tobacco Use   Smoking status: Never   Smokeless tobacco: Never  Substance and Sexual Activity    Alcohol use: Yes   Drug use: Never   Sexual activity: Not Currently  Other Topics Concern   Not on file  Social History Narrative   Not on file   Social Drivers of Health   Financial Resource Strain: Low Risk  (10/19/2022)   Received from [facility]   Overall Financial Resource Strain (CARDIA)    Difficulty of Paying Living Expenses: Not hard at all  Food Insecurity: No Food Insecurity (02/12/2024)   Hunger Vital Sign    Worried About Running Out of Food in the Last Year: Never true    Ran Out of Food in the Last Year: Never true  Transportation Needs: No Transportation Needs (02/12/2024)   PRAPARE - Administrator, Civil Service (Medical): No    Lack of Transportation (Non-Medical): No  Physical Activity: Insufficiently Active (10/19/2022)   Received from [facility]   Exercise Vital Sign    Days of Exercise per Week: 3 days    Minutes of Exercise per Session: 20 min  Stress: No Stress Concern Present (10/19/2022)   Received from [facility] of Occupational Health - Occupational Stress Questionnaire    Feeling of Stress : Not at all  Social Connections: Moderately Isolated (02/12/2024)   Social Connection and Isolation Panel [NHANES]    Frequency of Communication with Friends and Family: Once a week    Frequency of Social Gatherings with Friends and Family: Once  a week    Attends Religious Services: 1 to 4 times per year    Active Member of Clubs or Organizations: No    Attends Banker Meetings: 1 to 4 times per year    Marital Status: Divorced   Past Medical History:  Past Medical History:  Diagnosis Date   Alcohol abuse    Benzodiazepine withdrawal without complication (HCC) 02/12/2023   HTN (hypertension)    History reviewed. No pertinent surgical history.  Current Medications: Current Facility-Administered Medications  Medication Dose Route Frequency Provider Last Rate Last  Admin   alum & mag hydroxide-simeth (MAALOX/MYLANTA) 200-200-20 MG/5ML suspension 30 mL  30 mL Oral Q4H PRN Oneta Rack, NP       amLODipine (NORVASC) tablet 10 mg  10 mg Oral Daily Oneta Rack, NP   10 mg at 02/21/24 1030   ARIPiprazole (ABILIFY) tablet 5 mg  5 mg Oral QHS Verner Chol, MD   5 mg at 02/20/24 2117   busPIRone (BUSPAR) tablet 15 mg  15 mg Oral TID Oneta Rack, NP   15 mg at 02/21/24 1030   diphenhydrAMINE (BENADRYL) capsule 25 mg  25 mg Oral Q6H PRN Verner Chol, MD       doxepin (SINEQUAN) capsule 50 mg  50 mg Oral QHS Verner Chol, MD   50 mg at 02/20/24 2116   gabapentin (NEURONTIN) capsule 100 mg  100 mg Oral 2 times per day Verner Chol, MD   100 mg at 02/21/24 1030   gabapentin (NEURONTIN) capsule 300 mg  300 mg Oral QHS Verner Chol, MD   300 mg at 02/20/24 2116   hydrOXYzine (ATARAX) tablet 25 mg  25 mg Oral Q6H PRN Verner Chol, MD   25 mg at 02/20/24 1237   ibuprofen (ADVIL) tablet 400 mg  400 mg Oral Q6H PRN Verner Chol, MD   400 mg at 02/20/24 1837   mirtazapine (REMERON SOL-TAB) disintegrating tablet 45 mg  45 mg Oral QHS Bobbitt, Shalon E, NP   45 mg at 02/20/24 2120   multivitamin with minerals tablet 1 tablet  1 tablet Oral Daily Oneta Rack, NP   1 tablet at 02/21/24 1030   sertraline (ZOLOFT) tablet 200 mg  200 mg Oral Daily Oneta Rack, NP   200 mg at 02/21/24 1031   thiamine (VITAMIN B1) tablet 100 mg  100 mg Oral Daily Oneta Rack, NP   100 mg at 02/21/24 1030   traZODone (DESYREL) tablet 100 mg  100 mg Oral QHS Verner Chol, MD   100 mg at 02/20/24 2116   traZODone (DESYREL) tablet 50 mg  50 mg Oral QHS PRN Verner Chol, MD   50 mg at 02/21/24 0038    Lab Results:  No results found for this or any previous visit (from the past 48 hours).    Blood Alcohol level:  Lab Results  Component Value Date   ETH <10 02/10/2024   ETH 215 (H) 09/03/2023    Metabolic Disorder Labs: Lab Results  Component  Value Date   HGBA1C 5.2 02/18/2024   MPG 102.54 02/18/2024   MPG 93.93 05/25/2023   No results found for: "PROLACTIN" Lab Results  Component Value Date   CHOL 162 02/18/2024   TRIG 85 02/18/2024   HDL 49 02/18/2024   CHOLHDL 3.3 02/18/2024   VLDL 17 02/18/2024   LDLCALC 96 02/18/2024   LDLCALC 41 05/25/2023    Physical Findings: AIMS:  , ,  ,  ,  CIWA:  CIWA-Ar Total: 0 COWS:      Psychiatric Specialty Exam:   Presentation  General Appearance:  Appropriate for Environment; Casual   Eye Contact: Fair   Speech: Clear and Coherent   Speech Volume: Normal       Mood and Affect  Mood: Anxious and depressed   Affect: Restricted     Thought Process  Thought Processes: Coherent   Descriptions of Associations:Intact   Orientation:Full (Time, Place and Person)   Thought Content:Logical   Hallucinations:Hallucinations: None   Ideas of Reference:None   Suicidal Thoughts:Suicidal Thoughts: No   Homicidal Thoughts:Homicidal Thoughts: No     Sensorium  Memory: Immediate Fair; Recent Fair; Remote Poor   Judgment: Limited   Insight: Shallow     Executive Functions  Concentration: Fair   Attention Span: Fair   Recall: Fair   Fund of Knowledge: Fair   Language: Fair     Psychomotor Activity  Psychomotor Activity: Psychomotor Activity: Normal   Musculoskeletal: Strength & Muscle Tone: within normal limits Gait & Station: normal Assets  Assets: Manufacturing systems engineer; Desire for Improvement; Physical Health       Physical Exam: Physical Exam Vitals and nursing note reviewed.  HENT:     Head: Normocephalic.     Right Ear: Tympanic membrane normal.     Nose: Nose normal.     Mouth/Throat:     Mouth: Mucous membranes are moist.  Eyes:     Pupils: Pupils are equal, round, and reactive to light.  Cardiovascular:     Rate and Rhythm: Normal rate.  Pulmonary:     Breath sounds: Normal breath sounds.  Abdominal:      General: Bowel sounds are normal.  Skin:    General: Skin is warm.  Neurological:     General: No focal deficit present.     Mental Status: She is alert.      Review of Systems  Constitutional: Negative.   HENT: Negative.    Eyes: Negative.   Respiratory: Negative.    Cardiovascular: Negative.   Gastrointestinal: Negative.   Skin: Negative.   Neurological: Negative.    Blood pressure (!) 137/113, pulse 95, temperature 97.9 F (36.6 C), resp. rate 18, height 5\' 6"  (1.676 m), weight 68.9 kg, SpO2 99%. Body mass index is 24.53 kg/m.  Diagnosis: Principal Problem:   MDD (major depressive disorder), recurrent episode (HCC) Sedative-hypnotic use disorder, severe Alcohol use disorder, moderate    Treatment Plan Summary:   Safety and Monitoring:             -- Voluntary admission to inpatient psychiatric unit for safety, stabilization and treatment             -- Daily contact with patient to assess and evaluate symptoms and progress in treatment             -- Patient's case to be discussed in multi-disciplinary team meeting             -- Observation Level: q15 minute checks             -- Vital signs:  q12 hours             -- Precautions: suicide, elopement, and assault   2. Psychiatric Diagnoses and Treatment:  Librium Taper completed Added hydroxyzine 25 mg 3 times daily as needed anxiety Increase gabapentin 100mg  8 AM and 1 PM, 300 mg nightly to help with chronic alcoholism and anxiety    Abilify 5 mg nightly  to help with mood stabilization and treatment resistant depression, doxepin 50 mg nightly to help with her sleep.  Patient gave consent to the medications and risk benefits were discussed in thorough       Continue her home medications-Zoloft 200 mg daily, Remeron 45 mg nightly, trazodone 100 mg nightly and 50 mg as needed  Doxepin 50 mg at bedtime for insomnia -- The risks/benefits/side-effects/alternatives to this medication were discussed in detail with the  patient and time was given for questions. The patient consents to medication trial.               -- Encouraged patient to participate in unit milieu and in scheduled group therapies                            3. Medical Issues Being Addressed:   Alcohol detox with Librium    4. Discharge Planning:              -- Social work and case management to assist with discharge planning and identification of hospital follow-up needs prior to discharge             -- Estimated LOS: 1- 2 days             -- Discharge Concerns: Need to establish a safety plan; Medication compliance and effectiveness             -- Discharge Goals: Return home with outpatient referrals follow ups   Physician Treatment Plan for Primary Diagnosis: MDD (major depressive disorder), recurrent episode (HCC) Long Term Goal(s): Improvement in symptoms so as ready for discharge   Short Term Goals: Ability to identify changes in lifestyle to reduce recurrence of condition will improve, Ability to verbalize feelings will improve, Ability to disclose and discuss suicidal ideas, Ability to demonstrate self-control will improve, and Ability to identify and develop effective coping behaviors will improve    Lewanda Rife, MD 02/21/2024, 2:14 PM

## 2024-02-21 NOTE — Group Note (Signed)
 Date:  02/21/2024 Time:  11:33 PM  Group Topic/Focus:  Wrap-Up Group:   The focus of this group is to help patients review their daily goal of treatment and discuss progress on daily workbooks.    Participation Level:  Active  Participation Quality:  Appropriate  Affect:  Appropriate  Cognitive:  Alert  Insight: Appropriate  Engagement in Group:  Engaged  Modes of Intervention:  Discussion  Additional Comments:    Maeola Harman 02/21/2024, 11:33 PM

## 2024-02-21 NOTE — Group Note (Signed)
 Recreation Therapy Group Note   Group Topic:Health and Wellness  Group Date: 02/21/2024 Start Time: 1520 End Time: 1620 Facilitators: Rosina Lowenstein, LRT, CTRS Location: Courtyard  Group Description: Outdoor Recreation. Patients had the option to play corn hole, ring toss, bowling or listening to music while outside in the courtyard getting fresh air and sunlight. LRT and patients discussed things that they enjoy doing in their free time outside of the hospital. LRT encouraged patients to drink water after being active and getting their heart rate up.   Goal Area(s) Addressed: Patient will identify leisure interests.  Patient will practice healthy decision making. Patient will engage in recreation activity.   Affect/Mood: Appropriate   Participation Level: Minimal    Clinical Observations/Individualized Feedback: Stephanie Riley came to group late and left early. Pt was calm and pleasant when present.   Plan: Continue to engage patient in RT group sessions 2-3x/week.   Rosina Lowenstein, LRT, CTRS 02/21/2024 5:19 PM

## 2024-02-21 NOTE — Progress Notes (Signed)
   02/21/24 1024  Psych Admission Type (Psych Patients Only)  Admission Status Voluntary  Psychosocial Assessment  Patient Complaints Anxiety;Depression  Eye Contact Fair  Facial Expression Animated  Affect Appropriate to circumstance  Speech Logical/coherent  Interaction Assertive  Motor Activity Slow  Appearance/Hygiene Unremarkable  Behavior Characteristics Cooperative  Mood Pleasant  Thought Process  Coherency WDL  Content WDL  Delusions None reported or observed  Perception WDL  Hallucination None reported or observed  Judgment WDL  Confusion None  Danger to Self  Current suicidal ideation? Denies  Danger to Others  Danger to Others None reported or observed

## 2024-02-21 NOTE — Plan of Care (Signed)
   Problem: Education: Goal: Utilization of techniques to improve thought processes will improve Outcome: Progressing Goal: Knowledge of the prescribed therapeutic regimen will improve Outcome: Progressing

## 2024-02-21 NOTE — Group Note (Signed)
 Recreation Therapy Group Note   Group Topic:Stress Management  Group Date: 02/21/2024 Start Time: 1100 End Time: 1140 Facilitators: Rosina Lowenstein, LRT, CTRS Location:  Dayroom  Group Description: PMR (Progressive Muscle Relaxation). LRT educates patients on what PMR is and the benefits that come from it. Patients are asked to sit with their feet flat on the floor while sitting up and all the way back in their chair, if possible. LRT and pts follow a prompt through a speaker that requires you to tense and release different muscles in their body and focus on their breathing. During session, lights are off and soft music is being played. Pts are given a stress ball to use if needed.  LRT and patients discuss how this technique can be used as a coping skill post-discharge.   Goal Area(s) Addressed:  Patients will be able to describe progressive muscle relaxation.  Patient will practice using relaxation technique. Patient will identify a new coping skill.  Patient will follow multistep directions to reduce anxiety and stress   Affect/Mood: Appropriate   Participation Level: Active and Engaged   Participation Quality: Independent   Behavior: Calm and Cooperative   Speech/Thought Process: Coherent   Insight: Good   Judgement: Good   Modes of Intervention: Activity   Patient Response to Interventions:  Receptive   Education Outcome:  Acknowledges education   Clinical Observations/Individualized Feedback: Stephanie Riley was active in their participation of session activities and group discussion. Pt interacted well with LRT and peers duration of session.    Plan: Continue to engage patient in RT group sessions 2-3x/week.   Rosina Lowenstein, LRT, CTRS 02/21/2024 1:52 PM

## 2024-02-22 DIAGNOSIS — F419 Anxiety disorder, unspecified: Secondary | ICD-10-CM | POA: Diagnosis not present

## 2024-02-22 DIAGNOSIS — F332 Major depressive disorder, recurrent severe without psychotic features: Secondary | ICD-10-CM | POA: Diagnosis not present

## 2024-02-22 NOTE — Progress Notes (Signed)
   02/22/24 0722  Psych Admission Type (Psych Patients Only)  Admission Status Voluntary  Psychosocial Assessment  Patient Complaints Anxiety  Eye Contact Fair  Facial Expression Animated  Affect Appropriate to circumstance  Speech Logical/coherent  Interaction Assertive  Motor Activity Slow  Appearance/Hygiene Unremarkable  Behavior Characteristics Cooperative  Mood Pleasant  Thought Process  Coherency WDL  Content WDL  Delusions None reported or observed  Perception WDL  Hallucination None reported or observed  Judgment WDL  Confusion None  Danger to Self  Current suicidal ideation? Denies  Danger to Others  Danger to Others None reported or observed

## 2024-02-22 NOTE — Group Note (Signed)
 Date:  02/22/2024 Time:  10:57 AM  Group Topic/Focus:  Orientation:   The focus of this group is to educate the patient on the purpose and policies of crisis stabilization and provide a format to answer questions about their admission.  The group details unit policies and expectations of patients while admitted.    Participation Level:  Active  Participation Quality:  Appropriate  Affect:  Appropriate  Cognitive:  Appropriate  Insight: Appropriate  Engagement in Group:  Engaged  Modes of Intervention:  Socialization  Additional Comments:     Alexis Frock 02/22/2024, 10:57 AM

## 2024-02-22 NOTE — Progress Notes (Signed)
 [facility] MD Progress Note  02/22/2024 8:16 PM Stephanie Riley  MRN:  [id number]  Kharter Sestak, 73 y/o female with history of alcohol abuse, GAD, MDD, substance-induced mood disorder, insomnia, adjustment disorder and ADHD, presented to [facility] voluntarily. Per the patient she is having a nervous breakdown because her husband and her was going through a divorce and she found out that he divorced her without she knowing. According to patient they have been together for 50 years. Patient stated that she does see a psychiatrist here at the walk-in clinic.   Subjective:  Chart reviewed, case discussed in multidisciplinary meeting, patient seen during rounds. No acute events overnight. Patient feels less anxious today. Her affect is more animated.  Patient was encouraged to attend group and work on coping strategies. Patient denies SI/HI  Sleep: Fair  Appetite:  Fair  Past Psychiatric History: see h&P Family History: History reviewed. No pertinent family history. Social History:  Social History   Substance and Sexual Activity  Alcohol Use Yes     Social History   Substance and Sexual Activity  Drug Use Never    Social History   Socioeconomic History   Marital status: Married    Spouse name: Not on file   Number of children: Not on file   Years of education: Not on file   Highest education level: Not on file  Occupational History   Not on file  Tobacco Use   Smoking status: Never   Smokeless tobacco: Never  Substance and Sexual Activity   Alcohol use: Yes   Drug use: Never   Sexual activity: Not Currently  Other Topics Concern   Not on file  Social History Narrative   Not on file   Social Drivers of Health   Financial Resource Strain: Low Risk  (10/19/2022)   Received from [facility], [facility]   Overall Financial Resource Strain (CARDIA)    Difficulty of Paying Living Expenses: Not hard at all  Food Insecurity: No Food Insecurity (02/12/2024)   Hunger Vital Sign     Worried About Running Out of Food in the Last Year: Never true    Ran Out of Food in the Last Year: Never true  Transportation Needs: No Transportation Needs (02/12/2024)   PRAPARE - Administrator, Civil Service (Medical): No    Lack of Transportation (Non-Medical): No  Physical Activity: Insufficiently Active (10/19/2022)   Received from [facility], [facility]   Exercise Vital Sign    Days of Exercise per Week: 3 days    Minutes of Exercise per Session: 20 min  Stress: No Stress Concern Present (10/19/2022)   Received from [facility], [facility]ational [facility] - Occupational Stress Questionnaire    Feeling of Stress : Not at all  Social Connections: Moderately Isolated (02/12/2024)   Social Connection and Isolation Panel [NHANES]    Frequency of Communication with Friends and Family: Once a week    Frequency of Social Gatherings with Friends and Family: Once a week    Attends Religious Services: 1 to 4 times per year    Active Member of [employer] or Organizations: No    Attends Engineer, structural: 1 to 4 times per year    Marital Status: Divorced   Past Medical History:  Past Medical History:  Diagnosis Date   Alcohol abuse    Benzodiazepine withdrawal without complication (HCC) 02/12/2023   HTN (hypertension)    History reviewed. No pertinent  surgical history.  Current Medications: Current Facility-Administered Medications  Medication Dose Route Frequency Provider Last Rate Last Admin   alum & mag hydroxide-simeth (MAALOX/MYLANTA) 200-200-20 MG/5ML suspension 30 mL  30 mL Oral Q4H PRN Oneta Rack, NP       amLODipine (NORVASC) tablet 10 mg  10 mg Oral Daily Oneta Rack, NP   10 mg at 02/22/24 0848   ARIPiprazole (ABILIFY) tablet 5 mg  5 mg Oral QHS Verner Chol, MD   5 mg at 02/21/24 2108   busPIRone (BUSPAR) tablet 15 mg  15 mg Oral TID Oneta Rack, NP   15 mg at 02/22/24 1459   diphenhydrAMINE (BENADRYL)  capsule 25 mg  25 mg Oral Q6H PRN Verner Chol, MD       doxepin (SINEQUAN) capsule 50 mg  50 mg Oral QHS Verner Chol, MD   50 mg at 02/21/24 2109   gabapentin (NEURONTIN) capsule 100 mg  100 mg Oral 2 times per day Verner Chol, MD   100 mg at 02/22/24 1459   gabapentin (NEURONTIN) capsule 300 mg  300 mg Oral QHS Verner Chol, MD   300 mg at 02/21/24 2108   hydrOXYzine (ATARAX) tablet 25 mg  25 mg Oral Q6H PRN Verner Chol, MD   25 mg at 02/22/24 1459   ibuprofen (ADVIL) tablet 400 mg  400 mg Oral Q6H PRN Verner Chol, MD   400 mg at 02/21/24 2110   mirtazapine (REMERON SOL-TAB) disintegrating tablet 45 mg  45 mg Oral QHS Bobbitt, Shalon E, NP   45 mg at 02/21/24 2111   multivitamin with minerals tablet 1 tablet  1 tablet Oral Daily Oneta Rack, NP   1 tablet at 02/22/24 0849   sertraline (ZOLOFT) tablet 200 mg  200 mg Oral Daily Oneta Rack, NP   200 mg at 02/22/24 0849   thiamine (VITAMIN B1) tablet 100 mg  100 mg Oral Daily Oneta Rack, NP   100 mg at 02/22/24 0849   traZODone (DESYREL) tablet 100 mg  100 mg Oral QHS Verner Chol, MD   100 mg at 02/21/24 2147   traZODone (DESYREL) tablet 50 mg  50 mg Oral QHS PRN Verner Chol, MD   50 mg at 02/22/24 0007    Lab Results:  No results found for this or any previous visit (from the past 48 hours).    Blood Alcohol level:  Lab Results  Component Value Date   ETH <10 02/10/2024   ETH 215 (H) 09/03/2023    Metabolic Disorder Labs: Lab Results  Component Value Date   HGBA1C 5.2 02/18/2024   MPG 102.54 02/18/2024   MPG 93.93 05/25/2023   No results found for: "PROLACTIN" Lab Results  Component Value Date   CHOL 162 02/18/2024   TRIG 85 02/18/2024   HDL 49 02/18/2024   CHOLHDL 3.3 02/18/2024   VLDL 17 02/18/2024   LDLCALC 96 02/18/2024   LDLCALC 41 05/25/2023    Physical Findings: AIMS:  , ,  ,  ,    CIWA:  CIWA-Ar Total: 0 COWS:      Psychiatric Specialty Exam:   Presentation   General Appearance:  Appropriate for Environment; Casual   Eye Contact: Fair   Speech: Clear and Coherent   Speech Volume: Normal       Mood and Affect  Mood: Anxious and depressed   Affect: Restricted     Thought Process  Thought Processes: Coherent   Descriptions of Associations:Intact  Orientation:Full (Time, Place and Person)   Thought Content:Logical   Hallucinations:Hallucinations: None   Ideas of Reference:None   Suicidal Thoughts:Suicidal Thoughts: No   Homicidal Thoughts:Homicidal Thoughts: No     Sensorium  Memory: Immediate Fair; Recent Fair; Remote Poor   Judgment: Limited   Insight: Shallow     Executive Functions  Concentration: Fair   Attention Span: Fair   Recall: Fair   Fund of Knowledge: Fair   Language: Fair     Psychomotor Activity  Psychomotor Activity: Psychomotor Activity: Normal   Musculoskeletal: Strength & Muscle Tone: within normal limits Gait & Station: normal Assets  Assets: Manufacturing systems engineer; Desire for Improvement; Physical Health       Physical Exam: Physical Exam Vitals and nursing note reviewed.  HENT:     Head: Normocephalic.     Right Ear: Tympanic membrane normal.     Nose: Nose normal.     Mouth/Throat:     Mouth: Mucous membranes are moist.  Eyes:     Pupils: Pupils are equal, round, and reactive to light.  Cardiovascular:     Rate and Rhythm: Normal rate.  Pulmonary:     Breath sounds: Normal breath sounds.  Abdominal:     General: Bowel sounds are normal.  Skin:    General: Skin is warm.  Neurological:     General: No focal deficit present.     Mental Status: She is alert.      Review of Systems  Constitutional: Negative.   HENT: Negative.    Eyes: Negative.   Respiratory: Negative.    Cardiovascular: Negative.   Gastrointestinal: Negative.   Skin: Negative.   Neurological: Negative.    Blood pressure 137/75, pulse 88, temperature 98.4 F (36.9 C), resp.  rate 14, height 5\' 6"  (1.676 m), weight 68.9 kg, SpO2 97%. Body mass index is 24.53 kg/m.  Diagnosis: Principal Problem:   MDD (major depressive disorder), recurrent episode (HCC) Active Problems:   Anxiety disorder Sedative-hypnotic use disorder, severe Alcohol use disorder, moderate    Treatment Plan Summary:   Safety and Monitoring:             -- Voluntary admission to inpatient psychiatric unit for safety, stabilization and treatment             -- Daily contact with patient to assess and evaluate symptoms and progress in treatment             -- Patient's case to be discussed in multi-disciplinary team meeting             -- Observation Level: q15 minute checks             -- Vital signs:  q12 hours             -- Precautions: suicide, elopement, and assault   2. Psychiatric Diagnoses and Treatment:  Librium Taper completed Added hydroxyzine 25 mg 3 times daily as needed anxiety Increase gabapentin 100mg  8 AM and 1 PM, 300 mg nightly to help with chronic alcoholism and anxiety    Abilify 5 mg nightly to help with mood stabilization and treatment resistant depression, doxepin 50 mg nightly to help with her sleep.  Patient gave consent to the medications and risk benefits were discussed in thorough       Continue her home medications-Zoloft 200 mg daily, Remeron 45 mg nightly, trazodone 100 mg nightly and 50 mg as needed  Doxepin 50 mg at bedtime for insomnia -- The risks/benefits/side-effects/alternatives to this  medication were discussed in detail with the patient and time was given for questions. The patient consents to medication trial.               -- Encouraged patient to participate in unit milieu and in scheduled group therapies                            3. Medical Issues Being Addressed:   Alcohol detox with Librium    4. Discharge Planning:              -- Social work and case management to assist with discharge planning and identification of hospital follow-up  needs prior to discharge             -- Anticipated discharge tomorrow  Physician Treatment Plan for Primary Diagnosis: MDD (major depressive disorder), recurrent episode (HCC) Long Term Goal(s): Improvement in symptoms so as ready for discharge   Short Term Goals: Ability to identify changes in lifestyle to reduce recurrence of condition will improve, Ability to verbalize feelings will improve, Ability to disclose and discuss suicidal ideas, Ability to demonstrate self-control will improve, and Ability to identify and develop effective coping behaviors will improve    Lewanda Rife, MD 02/22/2024, 8:16 PM

## 2024-02-22 NOTE — Group Note (Signed)
 Date:  02/22/2024 Time:  9:37 PM  Group Topic/Focus:  Wrap-Up Group:   The focus of this group is to help patients review their daily goal of treatment and discuss progress on daily workbooks.    Participation Level:  Active  Participation Quality:  Appropriate  Affect:  Appropriate  Cognitive:  Alert  Insight: Appropriate  Engagement in Group:  Engaged  Modes of Intervention:  Discussion  Additional Comments:    Stephanie Riley 02/22/2024, 9:37 PM

## 2024-02-22 NOTE — Progress Notes (Signed)
   02/22/24 0000  Psych Admission Type (Psych Patients Only)  Admission Status Voluntary  Psychosocial Assessment  Patient Complaints Anxiety  Eye Contact Fair  Facial Expression Animated  Affect Appropriate to circumstance  Speech Logical/coherent  Interaction Needy;Assertive  Motor Activity Slow  Appearance/Hygiene Unremarkable  Behavior Characteristics Cooperative  Mood Pleasant  Thought Process  Coherency WDL  Content WDL  Delusions None reported or observed  Perception WDL  Hallucination None reported or observed  Judgment WDL  Confusion None  Danger to Self  Current suicidal ideation? Denies  Danger to Others  Danger to Others None reported or observed

## 2024-02-22 NOTE — Group Note (Signed)
 Recreation Therapy Group Note   Group Topic:Other  Group Date: 02/22/2024 Start Time: 1500 End Time: 1605 Facilitators: Rosina Lowenstein, LRT, CTRS Location: Courtyard  Group Description: Music Reminisce. LRT encouraged patients to think of their favorite song(s) that reminded them of a positive memory or time in their life. LRT encouraged patient to talk about that memory aloud to the group. LRT played the song through a speaker for all to hear. LRT and patients discussed how thinking of a positive memory or time in their life can be used as a coping skill in everyday life post discharge.   Goal Area(s) Addressed: Patient will increase verbal communication by conversing with peers. Patient will contribute to group discussion with minimal prompting. Patient will reminisce a positive memory or moment in their life.    Affect/Mood: Appropriate   Participation Level: Active and Engaged   Participation Quality: Independent   Behavior: Appropriate, Calm, and Cooperative   Speech/Thought Process: Coherent   Insight: Good   Judgement: Good   Modes of Intervention: Activity, Music, Open Conversation, and Rapport Building   Patient Response to Interventions:  Attentive, Engaged, Interested , and Receptive   Education Outcome:  Acknowledges education   Clinical Observations/Individualized Feedback: Akasha was active in their participation of session activities and group discussion. Pt chose to read her book and talk with peers while outside. Pt interacted well with LRT and peers duration of session.    Plan: Continue to engage patient in RT group sessions 2-3x/week.   Rosina Lowenstein, LRT, CTRS 02/22/2024 4:59 PM

## 2024-02-22 NOTE — Group Note (Signed)
 Recreation Therapy Group Note   Group Topic:Coping Skills  Group Date: 02/22/2024 Start Time: 1100 End Time: 1140 Facilitators: Rosina Lowenstein, LRT, CTRS Location:  Dayroom  Group Description: Seated Exercise. LRT discussed the mental and physical benefits of exercise. LRT and group discussed how physical activity can be used as a coping skill. [name] and LRT followed along to an exercise video on the TV screen that provided a visual representation and audio description of every exercise performed. [name] encouraged to listen to their bodies and stop at any time if they experience feelings of discomfort or pain. Pts were encouraged to drink water and stay hydrated.   Goal Area(s) Addressed: Patient will learn benefits of physical activity. Patient will identify exercise as a coping skill.  Patient will follow multistep directions. Patient will try a new leisure interest.    Affect/Mood: Appropriate   Participation Level: Active and Engaged   Participation Quality: Independent   Behavior: Calm and Cooperative   Speech/Thought Process: Coherent   Insight: Good   Judgement: Good   Modes of Intervention: Activity, Education, and Music   Patient Response to Interventions:  Attentive, Engaged, and Receptive   Education Outcome:  Acknowledges education   Clinical Observations/Individualized Feedback: Deliah was active in their participation of session activities and group discussion. Pt completed all exercises as prompted. Pt interacted well with LRT and peers duration of session.    Plan: Continue to engage patient in RT group sessions 2-3x/week.   [address] LRT, CTRS 02/22/2024 1:22 PM

## 2024-02-22 NOTE — Plan of Care (Signed)

## 2024-02-23 ENCOUNTER — Telehealth (HOSPITAL_COMMUNITY): Payer: Self-pay | Admitting: Student in an Organized Health Care Education/Training Program

## 2024-02-23 DIAGNOSIS — F419 Anxiety disorder, unspecified: Secondary | ICD-10-CM | POA: Diagnosis not present

## 2024-02-23 DIAGNOSIS — F332 Major depressive disorder, recurrent severe without psychotic features: Secondary | ICD-10-CM | POA: Diagnosis not present

## 2024-02-23 MED ORDER — AMLODIPINE BESYLATE 10 MG PO TABS: 10.0000 mg | ORAL_TABLET | Freq: Every day | ORAL | 0 refills | Status: AC

## 2024-02-23 MED ORDER — HYDROXYZINE HCL 25 MG PO TABS: 25.0000 mg | ORAL_TABLET | Freq: Four times a day (QID) | ORAL | 0 refills | Status: AC | PRN

## 2024-02-23 MED ORDER — DOXEPIN HCL 50 MG PO CAPS: 50.0000 mg | ORAL_CAPSULE | Freq: Every day | ORAL | 0 refills | Status: AC

## 2024-02-23 MED ORDER — GABAPENTIN 100 MG PO CAPS
100.0000 mg | ORAL_CAPSULE | Freq: Two times a day (BID) | ORAL | 0 refills | Status: AC
Start: 2024-02-23 — End: ?

## 2024-02-23 MED ORDER — GABAPENTIN 300 MG PO CAPS: 300.0000 mg | ORAL_CAPSULE | Freq: Every day | ORAL | 0 refills | Status: AC

## 2024-02-23 MED ORDER — SERTRALINE HCL 100 MG PO TABS: 200.0000 mg | ORAL_TABLET | Freq: Every day | ORAL | 0 refills | Status: AC

## 2024-02-23 MED ORDER — ARIPIPRAZOLE 5 MG PO TABS: 5.0000 mg | ORAL_TABLET | Freq: Every day | ORAL | 0 refills | Status: AC

## 2024-02-23 MED ORDER — BUSPIRONE HCL 15 MG PO TABS: 15.0000 mg | ORAL_TABLET | Freq: Three times a day (TID) | ORAL | 0 refills | Status: AC

## 2024-02-23 MED ORDER — MIRTAZAPINE 45 MG PO TBDP: 45.0000 mg | ORAL_TABLET | Freq: Every day | ORAL | 0 refills | Status: AC

## 2024-02-23 MED ORDER — TRAZODONE HCL 100 MG PO TABS: 100.0000 mg | ORAL_TABLET | Freq: Every day | ORAL | 0 refills | Status: AC

## 2024-02-23 NOTE — Group Note (Signed)
 Date:  02/23/2024 Time:  10:45 AM  Group Topic/Focus:  Goals Group:   The focus of this group is to help patients establish daily goals to achieve during treatment and discuss how the patient can incorporate goal setting into their daily lives to aide in recovery.    Participation Level:  Active  Participation Quality:  Appropriate  Affect:  Appropriate  Cognitive:  Appropriate  Insight: Appropriate  Engagement in Group:  Engaged  Modes of Intervention:  Discussion   Ardelle Anton 02/23/2024, 10:45 AM

## 2024-02-23 NOTE — Progress Notes (Signed)
  [facility] Adult Case Management Discharge Plan :  Will you be returning to the same living situation after discharge:  Yes,  pt will return home  At discharge, do you have transportation home?: Yes,  CSW  will assist with transportation  Do you have the ability to pay for your medications: Yes,  [employer] MEDICARE / [employer] MEDICARE CHOICE PPO  Release of information consent forms completed and in the chart;  Patient's signature needed at discharge.  Patient to Follow up at:  Follow-up Information     [facility] Follow up on 02/25/2024.   Specialty: Urgent Care Why: Your appointment is scheduled for 02/25/24 at 9:00 AM . Please bring you hospital discharge paperwork. Contact information: [address] [place] [address] [phone]                 Next level of care provider has access to [facility] Link:no  Safety Planning and Suicide Prevention discussed: Stephanie Riley,  Stephanie Riley, daughter, [phone]     Has patient been referred to the Quitline?: Patient does not use tobacco/nicotine products  Patient has been referred for addiction treatment: Patient refused referral for treatment.  [address], LCSWA 02/23/2024, 9:29 AM

## 2024-02-23 NOTE — Plan of Care (Signed)
   02/22/24 2200  Psych Admission Type (Psych Patients Only)  Admission Status Voluntary  Psychosocial Assessment  Patient Complaints Anxiety  Eye Contact Fair  Facial Expression Animated  Affect Appropriate to circumstance  Speech Logical/coherent  Interaction Assertive  Motor Activity Slow  Appearance/Hygiene Unremarkable  Behavior Characteristics Cooperative;Appropriate to situation  Mood Pleasant  Thought Process  Coherency WDL  Content WDL  Delusions None reported or observed  Perception WDL  Hallucination None reported or observed  Judgment WDL  Confusion None  Danger to Self  Current suicidal ideation? Denies  Danger to Others  Danger to Others None reported or observed     Problem: Education: Goal: Utilization of techniques to improve thought processes will improve Outcome: Progressing Goal: Knowledge of the prescribed therapeutic regimen will improve Outcome: Progressing   Problem: Activity: Goal: Interest or engagement in leisure activities will improve Outcome: Progressing Goal: Imbalance in normal sleep/wake cycle will improve Outcome: Progressing

## 2024-02-23 NOTE — Progress Notes (Signed)
   02/23/24 1200  Psych Admission Type (Psych Patients Only)  Admission Status Voluntary  Psychosocial Assessment  Patient Complaints Anxiety  Eye Contact Fair  Facial Expression Animated  Affect Appropriate to circumstance  Speech Logical/coherent  Interaction Assertive  Motor Activity Slow  Appearance/Hygiene Unremarkable  Behavior Characteristics Cooperative  Mood Pleasant  Thought Process  Coherency WDL  Content WDL  Delusions None reported or observed  Perception WDL  Hallucination None reported or observed  Judgment WDL  Confusion None  Danger to Self  Current suicidal ideation? Denies  Danger to Others  Danger to Others None reported or observed

## 2024-02-23 NOTE — Discharge Summary (Signed)
 Physician Discharge Summary Note  Patient:  Stephanie Riley is an 73 y.o., female MRN:  [id number] DOB:  04/17/1951 Patient phone:  [phone] (home)  Patient address:   [address] [place] [address],    Date of Admission:  02/12/2024 Date of Discharge: 02/23/2024  Reason for Admission:  Stephanie Riley, 73 y/o female with history of alcohol abuse, GAD, MDD, substance-induced mood disorder, insomnia, adjustment disorder and ADHD, presented to [facility] voluntarily. Per the patient she is having a nervous breakdown because her husband and her was going through a divorce and she found out that he divorced her without she knowing. According to patient they have been together for 50 years   Principal Problem: MDD (major depressive disorder), recurrent episode (HCC) Discharge Diagnoses: Principal Problem:   MDD (major depressive disorder), recurrent episode (HCC) Active Problems:   Anxiety disorder   Past Psychiatric History: Prev Dx/Sx: MDD, generalized anxiety with panic attacks Current Psych Provider: Outpatient provider in [facility] Meds (current): Zoloft, BuSpar, Remeron and trazodone Previous Med Trials: Unable to recall Therapy: None currently   Prior Psych Hospitalization: Hospitalized few times for detox Prior Self Harm: Denies Prior Violence: Denies  Past Medical History:  Past Medical History:  Diagnosis Date   Alcohol abuse    Benzodiazepine withdrawal without complication (HCC) 02/12/2023   HTN (hypertension)    History reviewed. No pertinent surgical history. Family History: History reviewed. No pertinent family history.  Family Psychiatric  History: Parents with alcohol use Family Hx suicide: No deaths by suicide  Social History:  Social History   Substance and Sexual Activity  Alcohol Use Yes     Social History   Substance and Sexual Activity  Drug Use Never    Social History   Socioeconomic History   Marital status: Married     Spouse name: Not on file   Number of children: Not on file   Years of education: Not on file   Highest education level: Not on file  Occupational History   Not on file  Tobacco Use   Smoking status: Never   Smokeless tobacco: Never  Substance and Sexual Activity   Alcohol use: Yes   Drug use: Never   Sexual activity: Not Currently  Other Topics Concern   Not on file  Social History Narrative   Not on file   Social Drivers of Health   Financial Resource Strain: Low Risk  (10/19/2022)   Received from [facility], [facility]   Overall Financial Resource Strain (CARDIA)    Difficulty of Paying Living Expenses: Not hard at all  Food Insecurity: No Food Insecurity (02/12/2024)   Hunger Vital Sign    Worried About Running Out of Food in the Last Year: Never true    Ran Out of Food in the Last Year: Never true  Transportation Needs: No Transportation Needs (02/12/2024)   PRAPARE - Administrator, Civil Service (Medical): No    Lack of Transportation (Non-Medical): No  Physical Activity: Insufficiently Active (10/19/2022)   Received from [facility]   Exercise Vital Sign    Days of Exercise per Week: 3 days    Minutes of Exercise per Session: 20 min  Stress: No Stress Concern Present (10/19/2022)   Received from [facility], [facility] of Occupational Health - Occupational Stress Questionnaire    Feeling of Stress : Not at all  Social Connections: Moderately Isolated (02/12/2024)   Social Connection and Isolation  Panel [NHANES]    Frequency of Communication with Friends and Family: Once a week    Frequency of Social Gatherings with Friends and Family: Once a week    Attends Religious Services: 1 to 4 times per year    Active Member of [employer] or Organizations: No    Attends Banker Meetings: 1 to 4 times per year    Marital Status: Divorced    Hospital Course:  The patient was admitted to Inpatient psychiatric  treatment for stabilization of " nervous breakdown".  Patient was placed on safety precautions. The patient was evaluated and treated by the multidisciplinary treatment team including physicians, nurses, social workers and therapists. All medications were presented to the patient and the Patient gave consent to all the medications that they were given, as well as was explained the risks, benefits, side effects and alternatives of all medication therapies. The patient was integrated into the general milieu on the ward and encouraged to attend to her ADLs and participate in all groups and activities. During hospital course the Patient attended coping skill groups, music therapy and activity therapy groups. Patient was counseled on cognitive techniques/skills by multiple staff members and given support care by the staff.   Patient's medication regimen was evaluated and titrated to therapeutic levels to better Patient's overall daily functioning.  Patient was initially seen by Dr. Irwin Brakeman, and was started on Abilify, BuSpar, doxepin, gabapentin, Remeron, sertraline, and trazodone.  Patient was also detoxed using Librium taper off.  She tolerated the medication well with no significant side effects.  Due to Polypharmacy patient was offered tapering off few medicine, which the patient declined.  Patient reports that she would like to continue on current medicines.  She we discussed the need to taper off any medicine with her outpatient provider. During the hospitalization, the patient demonstrated a stabilization of mood and depression with  improved sleep and appetite. At the time of discharge, the patient denied any suicidal ideation/homicidal ideation and was not overtly depressed, manic or psychotic. The Patient was interacting well in groups and on the unit with their peers. Patient was able to identify a safety plan to include speaking with family, contacting outpatient provider or calling 911 if  hallucinations/delusions returned or worsened or thoughts of self-harm or suicide return. Patient was counselled on outpatient follow-up that was arranged prior to discharge.  Psychiatric Specialty Exam:   Presentation  General Appearance:  Appropriate for Environment; Casual   Eye Contact: Fair   Speech: Clear and Coherent   Speech Volume: Normal       Mood and Affect  Mood: Anxious   Affect: Stable, more animated     Thought Process  Thought Processes: Coherent   Descriptions of Associations:Intact   Orientation:Full (Time, Place and Person)   Thought Content:Logical   Hallucinations:Hallucinations: None   Ideas of Reference:None   Suicidal Thoughts:Suicidal Thoughts: No   Homicidal Thoughts:Homicidal Thoughts: No     Sensorium  Memory: Immediate Fair; Recent Fair;    Judgment: Improving   Insight: Improving     Executive Functions  Concentration: Fair   Attention Span: Fair   Recall: [employer] of Knowledge: Fair   Language: Fair     Psychomotor Activity  Psychomotor Activity: Psychomotor Activity: Normal   Musculoskeletal: Strength & Muscle Tone: within normal limits Gait & Station: normal Assets  Assets: Manufacturing systems engineer; Desire for Improvement; Physical Health       Physical Exam: Physical Exam Vitals and nursing note  reviewed.  HENT:     Head: Normocephalic.     Right Ear: Tympanic membrane normal.     Nose: Nose normal.     Mouth/Throat:     Mouth: Mucous membranes are moist.  Eyes:     Pupils: Pupils are equal, round, and reactive to light.  Cardiovascular:     Rate and Rhythm: Normal rate.  Pulmonary:     Breath sounds: Normal breath sounds.  Abdominal:     General: Bowel sounds are normal.  Skin:    General: Skin is warm.  Neurological:     General: No focal deficit present.     Mental Status: She is alert.      Review of Systems  Constitutional: Negative.   HENT: Negative.    Eyes:  Negative.   Respiratory: Negative.    Cardiovascular: Negative.   Gastrointestinal: Negative.   Skin: Negative.   Neurological: Negative.    Blood pressure 127/76, pulse 79, temperature 98 F (36.7 C), resp. rate 18, height 5\' 6"  (1.676 m), weight 68.9 kg, SpO2 98%. Body mass index is 24.53 kg/m.   Social History   Tobacco Use  Smoking Status Never  Smokeless Tobacco Never   Tobacco Cessation:  N/A, patient does not currently use tobacco products   Blood Alcohol level:  Lab Results  Component Value Date   ETH <10 02/10/2024   ETH 215 (H) 09/03/2023    Metabolic Disorder Labs:  Lab Results  Component Value Date   HGBA1C 5.2 02/18/2024   MPG 102.54 02/18/2024   MPG 93.93 05/25/2023   No results found for: "PROLACTIN" Lab Results  Component Value Date   CHOL 162 02/18/2024   TRIG 85 02/18/2024   HDL 49 02/18/2024   CHOLHDL 3.3 02/18/2024   VLDL 17 02/18/2024   LDLCALC 96 02/18/2024   LDLCALC 41 05/25/2023    See Psychiatric Specialty Exam and Suicide Risk Assessment completed by Attending Physician prior to discharge.  Discharge destination:  Home  Is patient on multiple antipsychotic therapies at discharge:  No     Recommended Plan for Multiple Antipsychotic Therapies: NA   Allergies as of 02/23/2024   No Known Allergies      Medication List     STOP taking these medications    hydrOXYzine 25 MG capsule Commonly known as: VISTARIL   naltrexone 50 MG tablet Commonly known as: DEPADE       TAKE these medications      Indication  ADULT GUMMY PO Take 2 tablets by mouth daily.    amLODipine 10 MG tablet Commonly known as: NORVASC Take 1 tablet (10 mg total) by mouth daily.    ARIPiprazole 5 MG tablet Commonly known as: ABILIFY Take 1 tablet (5 mg total) by mouth at bedtime.    B COMPLEX PO Take 1 tablet by mouth daily.    busPIRone 15 MG tablet Commonly known as: BUSPAR Take 1 tablet (15 mg total) by mouth 3 (three) times  daily.    doxepin 50 MG capsule Commonly known as: SINEQUAN Take 1 capsule (50 mg total) by mouth at bedtime.    ferrous sulfate 325 (65 FE) MG tablet Take 325 mg by mouth every morning.    gabapentin 100 MG capsule Commonly known as: NEURONTIN Take 1 capsule (100 mg total) by mouth 2 (two) times daily.    gabapentin 300 MG capsule Commonly known as: NEURONTIN Take 1 capsule (300 mg total) by mouth at bedtime.    hydrOXYzine 25 MG  tablet Commonly known as: ATARAX Take 1 tablet (25 mg total) by mouth every 6 (six) hours as needed for anxiety.    mirtazapine 45 MG disintegrating tablet Commonly known as: Remeron SolTab Take 1 tablet (45 mg total) by mouth at bedtime.    sertraline 100 MG tablet Commonly known as: Zoloft Take 2 tablets (200 mg total) by mouth daily.    traZODone 100 MG tablet Commonly known as: DESYREL Take 1 tablet (100 mg total) by mouth at bedtime. What changed:  medication strength how much to take additional instructions Another medication with the same name was removed. Continue taking this medication, and follow the directions you see here.         Follow-up Information     [facility] Follow up on 02/25/2024.   Specialty: Urgent Care Why: Your appointment is scheduled for 02/25/24 at 9:00 AM . Please bring you hospital discharge paperwork. Contact information: [address] [place] [address] [phone]               PATIENTS CONDITION AT DISCHARGE: Stable TOBACCO CESSATION SCREENING  Patient was screened and counselled on smoking cessation at time of discharge.    PRESCRIPTION ARE LOCATED: On Chart  DISCHARGE INSTRUCTIONS: Diet: Cardiac healthy Activity: As tolerated Take medications as prescribed and not to make any changes without first consulting with the outpatient provider. Patient was advised to avoid any illicit drugs or alcohol due to negative impact on physical and mental  health.  Patient should keep all follow up appointments.  TIME SPENT ON DISCHARGE: Over 35 minutes were spent on this patient's discharge including a face-to-face encounter, patient counseling, and preparation of discharge materials.    Signed: Lewanda Rife, MD 02/23/2024, 11:47 AM

## 2024-02-23 NOTE — Progress Notes (Signed)
 Patient is discharging at this time. Patient is A&Ox4. Stable. Patient denies SI,HI, and A/V/H with no plan/intent. Printed AVS reviewed with and given to patient along with medications and follow up appointments. Suicide safety plan complete with copy provided to patient. Original form in chart. Patient verbalized all understanding. Patient did not complete survey. All valuables/belongings returned to patient. Patient is being transported by taxi. Patient denies any pain/discomfort. No s/s of current distress.

## 2024-02-23 NOTE — Care Management Important Message (Signed)
 Important Message  Patient Details  Name: Stephanie Riley MRN: [id number] Date of Birth: 05/05/1951   Important Message Given:  Yes - Medicare IM     Elza Rafter, LCSWA 02/23/2024, 9:33 AM

## 2024-02-23 NOTE — [facility] Suicide Risk Assessment (Addendum)
 [facility] Discharge Suicide Risk Assessment   Principal Problem: MDD (major depressive disorder), recurrent episode (HCC) Discharge Diagnoses: Principal Problem:   MDD (major depressive disorder), recurrent episode (HCC) Active Problems:   Anxiety disorder   Psychiatric Specialty Exam:   Presentation  General Appearance:  Appropriate for Environment; Casual   Eye Contact: Fair   Speech: Clear and Coherent   Speech Volume: Normal       Mood and Affect  Mood: Anxious   Affect: Stable, more animated     Thought Process  Thought Processes: Coherent   Descriptions of Associations:Intact   Orientation:Full (Time, Place and Person)   Thought Content:Logical   Hallucinations:Hallucinations: None   Ideas of Reference:None   Suicidal Thoughts:Suicidal Thoughts: No   Homicidal Thoughts:Homicidal Thoughts: No     Sensorium  Memory: Immediate Fair; Recent Fair;    Judgment: Improving   Insight: Improving     Executive Functions  Concentration: Fair   Attention Span: Fair   Recall: [employer] of Knowledge: Fair   Language: Fair     Psychomotor Activity  Psychomotor Activity: Psychomotor Activity: Normal   Musculoskeletal: Strength & Muscle Tone: within normal limits Gait & Station: normal Assets  Assets: Manufacturing systems engineer; Desire for Improvement; Physical Health       Physical Exam: Physical Exam Vitals and nursing note reviewed.  HENT:     Head: Normocephalic.     Right Ear: Tympanic membrane normal.     Nose: Nose normal.     Mouth/Throat:     Mouth: Mucous membranes are moist.  Eyes:     Pupils: Pupils are equal, round, and reactive to light.  Cardiovascular:     Rate and Rhythm: Normal rate.  Pulmonary:     Breath sounds: Normal breath sounds.  Abdominal:     General: Bowel sounds are normal.  Skin:    General: Skin is warm.  Neurological:     General: No focal deficit present.     Mental Status: She is alert.       Review of Systems  Constitutional: Negative.   HENT: Negative.    Eyes: Negative.   Respiratory: Negative.    Cardiovascular: Negative.   Gastrointestinal: Negative.   Skin: Negative.   Neurological: Negative.    Blood pressure 137/75, pulse 88, temperature 98.4 F (36.9 C), resp. rate 14, height 5\' 6"  (1.676 m), weight 68.9 kg, SpO2 97%. Body mass index is 24.53 kg/m.   Demographic Factors:  Age 73 or older, Divorced or widowed, Caucasian, Low socioeconomic status, and Living alone  Loss Factors: Decrease in vocational status, Loss of significant relationship, and Decline in physical health  Historical Factors: Impulsivity  Risk Reduction Factors:   Religious beliefs about death, Positive social support, Positive therapeutic relationship, and Positive coping skills or problem solving skills  Continued Clinical Symptoms:  Alcohol/Substance Abuse/Dependencies  Cognitive Features That Contribute To Risk:  Thought constriction (tunnel vision)    Suicide Risk:  Minimal: No identifiable suicidal ideation.    Follow-up Information     [facility] Follow up on 02/25/2024.   Specialty: Urgent Care Why: Your appointment is scheduled for 02/25/24 at 9:00 AM . Please bring you hospital discharge paperwork. Contact information: [address] [place] [address] [phone]                Plan Of Care/Follow-up recommendations:  Per discharge summary  Lewanda Rife, MD 02/23/2024, 7:24 AM

## 2024-02-23 NOTE — Progress Notes (Signed)
 Therapy Group Note   Group Topic:Other  Group Date: 02/23/2024 Start Time: 1300 End Time: 1330 Facilitators: Yousaf Sainato, Thomes Dinning, PT        Group Description: Group discussed impact of balance on safety and independence with functional tasks.  Identified and discussed any self-perceived balance deficits to personalize information.  Discussed and reviewed strategies to address/improve balance deficits: use of assist devices, activity pacing/energy conservation, environment/home safety modifications, focusing attention/minimizing distraction.  Reviewed and participated with standing LE therex designed to target dynamic balance reactions and LE strength/stability; provided handouts with HEP to be utilized outside of group time as appropriate.  Allowed time for questions and further discussion on any balance or mobility concerns/needs.     Therapeutic Goal(s): Identify and discuss any individual balance deficits and functional implications. Identify and discuss any environmental/home safety modifications that can optimize balance and safety for mobility within the home. Demonstrate understanding and performance of standing therex designed to target dynamic balance deficits.     Individual Participation:  Pt actively participated with the discussion and physical activity components of the session.  Pt was generally steady with standing therex/balance activities requiring only SBA for general safety.           Participation Level: Active    Participation Quality: Minimal Cues    Behavior: Appropriate, Attentive , Calm, and Cooperative    Speech/Thought Process: Coherent, Focused, and Organized    Affect/Mood: Appropriate    Insight: Good    Judgement: Good    Individualization: See above   Modes of Intervention: Activity and Discussion  Patient Response to Interventions:  Attentive and Interested     Plan: Continue to engage patient in OT groups 1 - 2x/week.   Ovidio Hanger PT,  DPT 02/23/24, 3:29 PM

## 2024-02-24 ENCOUNTER — Telehealth (HOSPITAL_COMMUNITY): Payer: Self-pay

## 2024-02-24 NOTE — Telephone Encounter (Signed)
 Hello,   Pt/ Pharmacy is requesting for a refill of hydrOXYzine (ATARAX) 25 MG tablets to be sent to pharmacy.

## 2024-02-25 ENCOUNTER — Ambulatory Visit (HOSPITAL_COMMUNITY): Payer: Medicare PPO | Admitting: Student in an Organized Health Care Education/Training Program

## 2024-02-25 VITALS — BP 156/87 | HR 87 | Wt 160.0 lb

## 2024-02-25 DIAGNOSIS — F411 Generalized anxiety disorder: Secondary | ICD-10-CM

## 2024-02-25 DIAGNOSIS — F1020 Alcohol dependence, uncomplicated: Secondary | ICD-10-CM

## 2024-02-25 DIAGNOSIS — F331 Major depressive disorder, recurrent, moderate: Secondary | ICD-10-CM

## 2024-02-25 NOTE — Telephone Encounter (Signed)
 Pt seen today, will send in based on discussion. Thanks!

## 2024-02-25 NOTE — Progress Notes (Signed)
 [facility] MD/PA/NP OP Progress Note  02/25/2024 2:27 PM BEENA CATANO  MRN:  [id number]  Chief Complaint:  No chief complaint on file.  HPI: Stephanie Riley is a 73 yo patient with a PPH of MDD, Etoh use disorder, self-reported ADHD, anxiety, and chronic prescribed BZD use (tapered off in 2024).  Current regimen since dc from [facility] psych unit.    - Remeron 45mg  QHS - Buspar 15mg  TID - Zoloft 200mg  daily - Trazodone 150mg  at bedtime - gabapentin 100mg  BID and 300mg  at bedtime - Abilify 5mg  at bedtime - hydroxyzine 25mg  TID PRN> Hydroxyzine 25-50mg  tid PRN - remeron 45mg  at bedtime - doxepin 50mg  will start taper to 25mg  for 1 week then discontinue  Patient was recently discharged from [facility] psych unit after stay 02/12/2024- 02/23/2024 for MDD, Anxiety, and Etoh detox. Patient was placed on the above medications with some changes where naltrexone was dc'd. \ Patient is looking into other psychiatrist due to this provider graduating. [facility] would like patient to go back to trazodone 150mg  at bedtime, titrate off doxpein and return to hydroxyzine 25-50mg  TID PRN.   Patient was dc'd from [facility] 2 days ago and has started AA again, attending yesterday. Patient has had not Etoh since dc and has disposed of all Etoh in her house. Patient reports that she does not wish to continue Abilify and is concerned with her polypharmacy as well and would like to see how she does with sobriety and her her anxiety. Patient reports that her anxiety has improved, she is getting out of the home. Patient reports that she started to sleep a bit better averaging 6 hours, she has moderate energy. Pt reports that she is trying to be more independent even thought her ex-husband is offering to to take her to things she is trying to do stuff on her own including driving to her own appts. Patient reports that she is still having problems with concentration.  Patient endorses a good appetite.    Patient reports that she continues to have a good support system in her family. Patient denies SI, HI, and AVH. Patient feels comfortable transitioning care to [facility].  Psycho education continued on effects of Etoh on mood, anxiety, and sleep.   Visit Diagnosis:    ICD-10-CM   1. Alcohol use disorder, moderate, dependence (HCC)  F10.20     2. GAD (generalized anxiety disorder)  F41.1          Past Psychiatric History: OPT: Hx of Adderall, Zoloft, Trazodone, Wellbutrin, Xanax, Klonopin, no psychiatry opt 12/2022- INPT at [facility] , patient noted to have yellow/orange sclera on arrival, but cleared by discharge. Dx with Etoh use d/o,. MOCA was done due to cognitive concerns, scored 23/30. 2nd hosp in 2024- [facility], BZD withdrawal Dx: MDD, Etoh use disorder, ADHD, anxiety, and chronic prescribed BZD use. Therapy: marriage counseling 03/2023- Sent to [facility], for Etoh detox. Patient was already approx 5 days sober from BZDs but still heavily drinking. In [facility] patient had hyperbilirubinemia,  hypokalemia and UTI and was treated for this. She was also placed on Librium taper, patient tolerated well  03/2023- 09/2023. Patient did not f/u with [facility]. Patient presented to [facility] medical ED once prior to admission in 08/2023 related to etoh use d/o. Patient did go to Residential [facility]hab over the summer at [facility] in [place] and did complete the program after this 05/2023 appearance to the ED. Patient was dc'd from  [facility] 09/09/2023 for Etoh use d/o, Alcoholic ketoacidosis, elevated CK, aspiration, elevated LFTs, SIRS, hypocalcemia and hypophosphatemia, HTN and has had a f/u with her PCP and has fungal dermatitis. She was dc'd with instructions to continue remeron 7.5mg  at bedtime, zoloft 100mg  daily, naltrexone 50mg  daily, buspar 15mg  tid, trazodone 100mg  at bedtime. 09/2023- Conitnues to endorse severe anxiety, increased remeron on her own to 22mg , at appt increased to 45mg  due to continued anxiety. Conitued  Buspar at 15mg  TID and Zoloft 200mg  and trazodone 150mg  at bedtime. Remains sober  09/2023-Telephone: patient endorse severe anxiety and had been taking more than prescribed Remeron, patient was given 11 day supply to get her to next appt but made it clear to patient that she must take meds as prescribed and will not get an another early rx from provider after this 10/2023-Patient anxiety has improved, patient remains sober and is now active going to therapy, communicating with her healthcare providers, taking medication as prescribed, and going to the gym as she had previously endorsed was her goal.  Past Medical History:  Past Medical History:  Diagnosis Date   Alcohol abuse    Benzodiazepine withdrawal without complication (HCC) 02/12/2023   HTN (hypertension)    No past surgical history on file.  Family Psychiatric History: Both parents had Alzhmier's   Family History: No family history on file.  Social History:  Social History   Socioeconomic History   Marital status: Married    Spouse name: Not on file   Number of children: Not on file   Years of education: Not on file   Highest education level: Not on file  Occupational History   Not on file  Tobacco Use   Smoking status: Never   Smokeless tobacco: Never  Substance and Sexual Activity   Alcohol use: Yes   Drug use: Never   Sexual activity: Not Currently  Other Topics Concern   Not on file  Social History Narrative   Not on file   Social Drivers of Health   Financial Resource Strain: Low Risk  (10/19/2022)   Received from [facility], [facility]   Overall Financial Resource Strain (CARDIA)    Difficulty of Paying Living Expenses: Not hard at all  Food Insecurity: No Food Insecurity (02/12/2024)   Hunger Vital Sign    Worried About Running Out of Food in the Last Year: Never true    Ran Out of Food in the Last Year: Never true  Transportation Needs: No Transportation Needs (02/12/2024)   PRAPARE - Therapist, art (Medical): No    Lack of Transportation (Non-Medical): No  Physical Activity: Insufficiently Active (10/19/2022)   Received from [facility], [facility]   Exercise Vital Sign    Days of Exercise per Week: 3 days    Minutes of Exercise per Session: 20 min  Stress: No Stress Concern Present (10/19/2022)   Received from [facility], [facility] of Occupational Health - Occupational Stress Questionnaire    Feeling of Stress : Not at all  Social Connections: Moderately Isolated (02/12/2024)   Social Connection and Isolation Panel [NHANES]    Frequency of Communication with Friends and Family: Once a week    Frequency of Social Gatherings with Friends and Family: Once a week    Attends Religious Services: 1 to 4 times per year    Active Member of [employer] or Organizations: No    Attends Banker Meetings: 1 to 4  times per year    Marital Status: Divorced    Allergies: No Known Allergies  Metabolic Disorder Labs: Lab Results  Component Value Date   HGBA1C 5.2 02/18/2024   MPG 102.54 02/18/2024   MPG 93.93 05/25/2023   No results found for: "PROLACTIN" Lab Results  Component Value Date   CHOL 162 02/18/2024   TRIG 85 02/18/2024   HDL 49 02/18/2024   CHOLHDL 3.3 02/18/2024   VLDL 17 02/18/2024   LDLCALC 96 02/18/2024   LDLCALC 41 05/25/2023   Lab Results  Component Value Date   TSH 3.122 02/10/2024   TSH 1.830 05/25/2023    Therapeutic Level Labs: No results found for: "LITHIUM" No results found for: "VALPROATE" No results found for: "CBMZ"  Current Medications: Current Outpatient Medications  Medication Sig Dispense Refill   amLODipine (NORVASC) 10 MG tablet Take 1 tablet (10 mg total) by mouth daily. 30 tablet 0   ARIPiprazole (ABILIFY) 5 MG tablet Take 1 tablet (5 mg total) by mouth at bedtime. 30 tablet 0   B Complex Vitamins (B COMPLEX PO) Take 1 tablet by mouth daily.     busPIRone (BUSPAR) 15 MG  tablet Take 1 tablet (15 mg total) by mouth 3 (three) times daily. 90 tablet 0   doxepin (SINEQUAN) 50 MG capsule Take 1 capsule (50 mg total) by mouth at bedtime. 30 capsule 0   ferrous sulfate 325 (65 FE) MG tablet Take 325 mg by mouth every morning.     gabapentin (NEURONTIN) 100 MG capsule Take 1 capsule (100 mg total) by mouth 2 (two) times daily. 60 capsule 0   gabapentin (NEURONTIN) 300 MG capsule Take 1 capsule (300 mg total) by mouth at bedtime. 30 capsule 0   hydrOXYzine (ATARAX) 25 MG tablet Take 1 tablet (25 mg total) by mouth every 6 (six) hours as needed for anxiety. 30 tablet 0   mirtazapine (REMERON SOLTAB) 45 MG disintegrating tablet Take 1 tablet (45 mg total) by mouth at bedtime. 90 tablet 0   Multiple Vitamins-Minerals (ADULT GUMMY PO) Take 2 tablets by mouth daily.     sertraline (ZOLOFT) 100 MG tablet Take 2 tablets (200 mg total) by mouth daily. 30 tablet 0   traZODone (DESYREL) 100 MG tablet Take 1 tablet (100 mg total) by mouth at bedtime. 30 tablet 0   No current facility-administered medications for this visit.     Musculoskeletal: Strength & Muscle Tone: within normal limits Gait & Station: normal Patient leans: Front  Psychiatric Specialty Exam: Review of Systems  Psychiatric/Behavioral:  Positive for sleep disturbance. Negative for dysphoric mood, hallucinations and suicidal ideas. The patient is not nervous/anxious.     There were no vitals taken for this visit.There is no height or weight on file to calculate BMI.  General Appearance: Fairly Groomed  Eye Contact:  Good  Speech:  Clear and Coherent  Volume:  Normal  Mood:  Euthymic  Affect:  Appropriate  Thought Process:  Coherent  Orientation:  Full (Time, Place, and Person)  Thought Content: Logical   Suicidal Thoughts:  No  Homicidal Thoughts:  No  Memory:  Immediate;   Good Recent;   Good  Judgement:  Fair  Insight:  Fair  Psychomotor Activity:  Normal  Concentration:  Concentration: Good   Recall:  Fair  Fund of Knowledge: Good  Language: Good  Akathisia:  No  Handed:    AIMS (if indicated): not done  Assets:  Communication Skills Desire for Improvement Financial Resources/Insurance Housing Leisure  Time Resilience Social Support  ADL's:  Intact  Cognition: WNL  Sleep:  Fair   Screenings: AUDIT    Flowsheet Row Admission (Discharged) from 02/12/2024 in [facility] BEH[facility]AL [facility] ED from 05/25/2023 in [facility] Department at [facility]  Alcohol Use Disorder Identification Test Final Score (AUDIT) 9 27      PHQ2-9    Flowsheet Row ED from 02/10/2024 in [facility] ED from 05/25/2023 in [facility] ED from 03/19/2023 in [facility] ED from 02/11/2023 in [facility]  PHQ-2 Total Score 6 6 3 1   PHQ-9 Total Score 24 9 3 4       Flowsheet Row Admission (Discharged) from 02/12/2024 in [facility] BEHAVIORAL MEDICINE ED from 02/10/2024 in [facility] ED to Hosp-Admission (Discharged) from 09/03/2023 in [place] LONG 4TH FLOOR PROGRESSIVE CARE AND UROLOGY  C-SSRS RISK CATEGORY No Risk No Risk No Risk        Assessment and Plan: Patient has restarted sobriety and has significant improvement in her judgment as she now see's that she benefited from hospitalization and is also trying to be less reliant on her husband. Patient understands that she should not and really wont be able to stay in her marital home, but is trying to make 1 decision at a time and is communicating with her support system. Pt and provider discussed closing her care out today as she will transition to [facility] since this provider will be graduating. No Rx sent today as patient has already started seeing new provider.     EtOH use disorder, moderate MDD, recurrent, moderate GAD - Remeron 45mg  QHS - Buspar 15mg  TID - Zoloft 200mg  daily -  Trazodone 150mg  at bedtime - gabapentin 100mg  BID and 300mg  at bedtime - Abilify 5mg  at bedtime - hydroxyzine 25mg  TID PRN> Hydroxyzine 25-50mg  tid PRN - remeron 45mg  at bedtime - doxepin 50mg  will start taper to 25mg  for 1 week then discontinue     Collaboration of Care: Collaboration of Care:   Patient/Guardian was advised Release of Information must be obtained prior to any record release in order to collaborate their care with an outside provider. Patient/Guardian was advised if they have not already done so to contact the registration department to sign all necessary forms in order for [place] to release information regarding their care.   Consent: Patient/Guardian gives verbal consent for treatment and assignment of benefits for services provided during this visit. Patient/Guardian expressed understanding and agreed to proceed.   PGY-4 [name], MD 02/25/2024, 2:27 PM

## 2024-04-19 ENCOUNTER — Telehealth (HOSPITAL_COMMUNITY): Payer: Self-pay

## 2024-04-19 NOTE — Telephone Encounter (Signed)
 Thank you, pt has a new provider over in [facility]. Her case has been closed at this office. She started seeing the provider before she saw me, and they took over her meds.

## 2024-04-19 NOTE — Telephone Encounter (Signed)
 Hello,    Noptsure if this has been filled, but Pt/ Pharmacy is requesting for a refill of hydrOXYzine  (ATARAX ) 25 MG tablets & Trazadone to be sent to pharmacy.   Last seen 3/21

## 2024-05-12 ENCOUNTER — Telehealth (HOSPITAL_COMMUNITY): Payer: Self-pay | Admitting: Licensed Clinical Social Worker

## 2024-05-12 NOTE — Telephone Encounter (Signed)
 The therapist returns a call from Rice Chamorro at [facility] who is apparently trying to refer Shylee for outpatient therapy.  Rice Chamorro indicates that she is unable to address this matter at this time as she is running a clinic currently.  She obtains this therapist's direct contact number and says that she will call back on Monday, June 9th.  Melynda Stagger, MA, LCSW, [facility], LCAS 05/12/2024

## 2024-08-29 ENCOUNTER — Telehealth (HOSPITAL_COMMUNITY): Payer: Self-pay | Admitting: Professional

## 2024-08-29 NOTE — Telephone Encounter (Signed)
 See call log

## 2024-09-04 ENCOUNTER — Ambulatory Visit (INDEPENDENT_AMBULATORY_CARE_PROVIDER_SITE_OTHER): Admitting: Professional

## 2024-09-04 DIAGNOSIS — F411 Generalized anxiety disorder: Secondary | ICD-10-CM

## 2024-09-04 DIAGNOSIS — F32A Depression, unspecified: Secondary | ICD-10-CM | POA: Diagnosis not present

## 2024-09-04 DIAGNOSIS — F332 Major depressive disorder, recurrent severe without psychotic features: Secondary | ICD-10-CM

## 2024-09-04 DIAGNOSIS — F1091 Alcohol use, unspecified, in remission: Secondary | ICD-10-CM | POA: Diagnosis not present

## 2024-09-04 DIAGNOSIS — F1021 Alcohol dependence, in remission: Secondary | ICD-10-CM
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
## 2024-09-06 ENCOUNTER — Ambulatory Visit (HOSPITAL_COMMUNITY)

## 2024-09-06 ENCOUNTER — Telehealth (HOSPITAL_COMMUNITY): Payer: Self-pay | Admitting: Professional

## 2024-09-06 NOTE — Telephone Encounter (Signed)
 Cln called pt to f/u on pt starting PHP due to pt not being in PHP this morning. Pt reports she is unsure how much she will pay for PHP. Cln explains we don't have a standard price to quote and explains insurance benefits vary. Pt reports she doesn't know what to do to continue and will speak with her supports and insurance. Pt will call back if she wants to start PHP. She is currently at [facility] IOP.

## 2024-09-07 ENCOUNTER — Ambulatory Visit (HOSPITAL_COMMUNITY)

## 2024-09-08 ENCOUNTER — Ambulatory Visit (HOSPITAL_COMMUNITY)

## 2024-09-08 ENCOUNTER — Telehealth (HOSPITAL_COMMUNITY): Payer: Self-pay | Admitting: Professional

## 2024-09-08 NOTE — Telephone Encounter (Signed)
 See call log

## 2024-09-11 ENCOUNTER — Ambulatory Visit (HOSPITAL_COMMUNITY)

## 2024-09-11 ENCOUNTER — Encounter (HOSPITAL_COMMUNITY): Payer: Self-pay

## 2024-09-12 ENCOUNTER — Ambulatory Visit (HOSPITAL_COMMUNITY)

## 2024-09-12 ENCOUNTER — Telehealth (HOSPITAL_COMMUNITY): Payer: Self-pay | Admitting: Licensed Clinical Social Worker

## 2024-09-12 NOTE — Telephone Encounter (Signed)
 Cln spoke to pt regarding beginning PHP. Pt was scheduled to begin PHP last week however was still evaluating if it was the best call. Pt states she has continued to go back and forth regarding whether group is the right fit for her right now. Pt states she stopped attending [facility] IOP on Friday because she could not afford the cost and she feels she has been doing better since discharging. Pt reports she has an ongoing therapist who feels pt needs something between appointments. Pt also states she is interested in addressing her trauma and getting support for that.  Cln informed pt that group does not do trauma specific work, however is focused on stability and is trauma-informed which may be helpful to pt and is for many PHP pt's who have experienced trauma. Cln offers pt alternatives of a women's only support group through [facility] as an addition to her ongoing therapy and cautions that it is not a replacement for therapy. Pt expresses interest in EMDR. Pt states she is confused and unsure what is the best option, but may want to try the group. Cln informed pt she can try it out and discharge if it is not a good fit.  Cln sent email to [email or web address] with information on [facility] support groups and a link to an EMDR therapist locator. Pt agrees to tentative start date of MON 10/13 for PHP and that she will continue to think about it and reach out if she changes her mind re: starting PHP. Pt denies current SI/HI and states she feels safe in her environment.

## 2024-09-13 ENCOUNTER — Ambulatory Visit (HOSPITAL_COMMUNITY)

## 2024-09-14 ENCOUNTER — Ambulatory Visit (HOSPITAL_COMMUNITY)

## 2024-09-15 ENCOUNTER — Ambulatory Visit (HOSPITAL_COMMUNITY)

## 2024-09-18 ENCOUNTER — Ambulatory Visit (HOSPITAL_COMMUNITY)

## 2024-09-19 ENCOUNTER — Encounter (HOSPITAL_COMMUNITY): Payer: Self-pay | Admitting: Licensed Clinical Social Worker

## 2024-09-19 ENCOUNTER — Ambulatory Visit (HOSPITAL_COMMUNITY)

## 2024-09-19 ENCOUNTER — Ambulatory Visit (INDEPENDENT_AMBULATORY_CARE_PROVIDER_SITE_OTHER): Admitting: Licensed Clinical Social Worker

## 2024-09-19 DIAGNOSIS — F331 Major depressive disorder, recurrent, moderate: Secondary | ICD-10-CM

## 2024-09-19 DIAGNOSIS — F332 Major depressive disorder, recurrent severe without psychotic features: Secondary | ICD-10-CM

## 2024-09-19 DIAGNOSIS — R4589 Other symptoms and signs involving emotional state: Secondary | ICD-10-CM

## 2024-09-19 DIAGNOSIS — F411 Generalized anxiety disorder: Secondary | ICD-10-CM

## 2024-09-19 DIAGNOSIS — F13931 Sedative, hypnotic or anxiolytic use, unspecified with withdrawal delirium: Secondary | ICD-10-CM

## 2024-09-19 NOTE — Progress Notes (Signed)
 Psychiatric Initial Adult Assessment   Patient Identification: Stephanie Riley MRN:  [id number] Date of Evaluation:  09/19/2024 Referral Source: Referred from [facility] Chief Complaint: Depression and posttraumatic stress disorder  Visit Diagnosis:    ICD-10-CM   1. Severe episode of recurrent major depressive disorder, without psychotic features (HCC)  F33.2     2. GAD (generalized anxiety disorder)  F41.1       History of Present Illness:  Stephanie Riley 73 year old female presents after recently completing intensive outpatient programming through [facility].  She reports she self referred due to insurance.  She denied suicidal or homicidal ideations.  Denies auditory visual hallucinations.  She reports struggling with C PTSD, increased anxiety and depression.  Reports a history of attention deficit disorder.  Stephanie Riley reports that her alcohol use disorder however states she has been sober since March/2025.  Had a relapse last December.  States that she does attend AA meetings.  Reports she was hospitalized at [facility] March 7.  Reports she is currently prescribed Zoloft , buspirone  and Wellbutrin.  States she does take Ativan  occasionally for increased anxiety.  Denied recent suicide attempts.  Denied that she is currently employed.  States she was supposed to follow-up with DBT therapy however due to having limited insurance she has not been able to establish care at this time.  Does report that she was off in her house for roughly a year due to her drinking.  Family is not supportive.  Stephanie Riley has some apprehension with continuing with group session after today.  She denied suicidal or homicidal ideations.  Denied auditory visual hallucinations.  Patient to start partial outpatient programming on 09/19/2024  During evaluation Stephanie Riley is sitting;she is alert/oriented x 4; calm/cooperative; and mood congruent with affect.  Presents slightly disheveled however does  report increased anxiety with starting group setting.  But pleasant mood congruent.  Patient is speaking in a clear tone at moderate volume, and normal pace; with good eye contact.  Her thought process is coherent and relevant; There is no indication that she is currently responding to internal/external stimuli or experiencing delusional thought content.  Patient denies suicidal/self-harm/homicidal ideation, psychosis, and paranoia.  Patient has remained calm throughout assessment and has answered questions appropriately    Associated Signs/Symptoms: Depression Symptoms:  depressed mood, difficulty concentrating, anxiety, (Hypo) Manic Symptoms:  Distractibility, Anxiety Symptoms:  Excessive Worry, Psychotic Symptoms:  Hallucinations: None PTSD Symptoms: Reported history related to CPTSD  Past Psychiatric History: History related to major depressive disorder, generalized anxiety disorder and posttraumatic stress disorder.  Reported history with ADD.  Has taken Ativan , doxepin , Zoloft  and Wellbutrin amendable to restarting at [employer]   Previous Psychotropic Medications: Yes   Substance Abuse History in the last 12 months:  Yes.    Consequences of Substance Abuse: NA  Past Medical History:  Past Medical History:  Diagnosis Date   Alcohol abuse    Benzodiazepine withdrawal without complication (HCC) 02/12/2023   HTN (hypertension)    No past surgical history on file.  Family Psychiatric History: Dr. Karalee  Family History: No family history on file.  Social History:   Social History   Socioeconomic History   Marital status: Married    Spouse name: Not on file   Number of children: Not on file   Years of education: Not on file   Highest education level: Not on file  Occupational History   Not on file  Tobacco Use   Smoking status: Never   Smokeless tobacco:  Never  Substance and Sexual Activity   Alcohol use: Yes   Drug use: Never   Sexual activity: Not Currently   Other Topics Concern   Not on file  Social History Narrative   Not on file   Social Drivers of Health   Financial Resource Strain: Low Risk  (10/19/2022)   Received from [facility]   Overall Financial Resource Strain (CARDIA)    Difficulty of Paying Living Expenses: Not hard at all  Food Insecurity: No Food Insecurity (02/12/2024)   Hunger Vital Sign    Worried About Running Out of Food in the Last Year: Never true    Ran Out of Food in the Last Year: Never true  Transportation Needs: No Transportation Needs (02/12/2024)   PRAPARE - Administrator, Civil Service (Medical): No    Lack of Transportation (Non-Medical): No  Physical Activity: Insufficiently Active (10/19/2022)   Received from [facility]   Exercise Vital Sign    On average, how many days per week do you engage in moderate to strenuous exercise (like a brisk walk)?: 3 days    On average, how many minutes do you engage in exercise at this level?: 20 min  Stress: No Stress Concern Present (10/19/2022)   Received from [facility] - Occupational Stress Questionnaire    Feeling of Stress : Not at all  Social Connections: Moderately Isolated (02/12/2024)   Social Connection and Isolation Panel    Frequency of Communication with Friends and Family: Once a week    Frequency of Social Gatherings with Friends and Family: Once a week    Attends Religious Services: 1 to 4 times per year    Active Member of [employer] or Organizations: No    Attends Engineer, structural: 1 to 4 times per year    Marital Status: Divorced    Additional Social History:   Allergies:  No Known Allergies  Metabolic Disorder Labs: Lab Results  Component Value Date   HGBA1C 5.2 02/18/2024   MPG 102.54 02/18/2024   MPG 93.93 05/25/2023   No results found for: PROLACTIN Lab Results  Component Value Date   CHOL 162 02/18/2024   TRIG 85 02/18/2024   HDL 49 02/18/2024   CHOLHDL  3.3 02/18/2024   VLDL 17 02/18/2024   LDLCALC 96 02/18/2024   LDLCALC 41 05/25/2023   Lab Results  Component Value Date   TSH 3.122 02/10/2024    Therapeutic Level Labs: No results found for: LITHIUM No results found for: CBMZ No results found for: VALPROATE  Current Medications: Current Outpatient Medications  Medication Sig Dispense Refill   amLODipine  (NORVASC ) 10 MG tablet Take 1 tablet (10 mg total) by mouth daily. 30 tablet 0   ARIPiprazole  (ABILIFY ) 5 MG tablet Take 1 tablet (5 mg total) by mouth at bedtime. 30 tablet 0   B Complex Vitamins (B COMPLEX PO) Take 1 tablet by mouth daily.     busPIRone  (BUSPAR ) 15 MG tablet Take 1 tablet (15 mg total) by mouth 3 (three) times daily. 90 tablet 0   doxepin  (SINEQUAN ) 50 MG capsule Take 1 capsule (50 mg total) by mouth at bedtime. 30 capsule 0   ferrous sulfate 325 (65 FE) MG tablet Take 325 mg by mouth every morning.     gabapentin  (NEURONTIN ) 100 MG capsule Take 1 capsule (100 mg total) by mouth 2 (two) times daily. 60 capsule 0   gabapentin  (NEURONTIN ) 300 MG capsule  Take 1 capsule (300 mg total) by mouth at bedtime. 30 capsule 0   hydrOXYzine  (ATARAX ) 25 MG tablet Take 1 tablet (25 mg total) by mouth every 6 (six) hours as needed for anxiety. 30 tablet 0   mirtazapine  (REMERON  SOLTAB) 45 MG disintegrating tablet Take 1 tablet (45 mg total) by mouth at bedtime. 90 tablet 0   Multiple Vitamins-Minerals (ADULT GUMMY PO) Take 2 tablets by mouth daily.     sertraline  (ZOLOFT ) 100 MG tablet Take 2 tablets (200 mg total) by mouth daily. 30 tablet 0   traZODone  (DESYREL ) 100 MG tablet Take 1 tablet (100 mg total) by mouth at bedtime. 30 tablet 0   No current facility-administered medications for this visit.    Musculoskeletal: Strength & Muscle Tone: within normal limits Gait & Station: normal slightly unsteady Patient leans: N/A  Psychiatric Specialty Exam: Review of Systems  Psychiatric/Behavioral:  Positive for  decreased concentration and sleep disturbance. Negative for suicidal ideas.   All other systems reviewed and are negative.   There were no vitals taken for this visit.There is no height or weight on file to calculate BMI.  General Appearance: Casual  Eye Contact:  Good  Speech:  Clear and Coherent  Volume:  Normal  Mood:  Anxious and Depressed  Affect:  Congruent  Thought Process:  Coherent  Orientation:  Full (Time, Place, and Person)  Thought Content:  Logical  Suicidal Thoughts:  No  Homicidal Thoughts:  No  Memory:  Immediate;   Good Recent;   Good  Judgement:  Good  Insight:  Good  Psychomotor Activity:  Normal  Concentration:  Concentration: Good  Recall:  Good  Fund of Knowledge:Good  Language: Fair  Akathisia:  No  Handed:  Right  AIMS (if indicated):  not done  Assets:  Communication Skills Desire for Improvement  ADL's:  Intact  Cognition: WNL  Sleep:  Poor   Screenings: AUDIT    Flowsheet Row Admission (Discharged) from 02/12/2024 in [facility] ED from 05/25/2023 in [facility] Emergency Department at [facility]  Alcohol Use Disorder Identification Test Final Score (AUDIT) 9 27   PHQ2-9    Flowsheet Row Counselor from 09/04/2024 in [facility] ED from 02/10/2024 in [facility] ED from 05/25/2023 in [facility] ED from 03/19/2023 in [facility] ED from 02/11/2023 in [facility]  PHQ-2 Total Score 6 6 6 3 1   PHQ-9 Total Score 17 24 9 3 4    Flowsheet Row Admission (Discharged) from 02/12/2024 in [facility] ED from 02/10/2024 in [facility] ED to Hosp-Admission (Discharged) from 09/03/2023 in [place] LONG 4TH FLOOR PROGRESSIVE CARE AND UROLOGY  C-SSRS RISK CATEGORY No Risk No Risk No Risk    Assessment and Plan:   Dominque Levandowski is  enrolled in  Partial Hospitalization Program (PHP), patient's current medications are to be continued, the following medications are being continued a comprehensive treatment plan will be developed and side effects of medications have been reviewed with patient  Treatment options and alternatives reviewed with patient and patient understands the above plan. Treatment plan was reviewed and agreed upon by NP T.Ezzard and patient Jamaya Sleeth need for group services.  Collaboration of Care: Medication Management AEB Wellbutrin, BuSpar , Ativan  and Zoloft   Patient/Guardian was advised Release of Information must be obtained prior to any record release in order to collaborate their care with  an outside provider. Patient/Guardian was advised if they have not already done so to contact the registration department to sign all necessary forms in order for us  to release information regarding their care.   Consent: Patient/Guardian gives verbal consent for treatment and assignment of benefits for services provided during this visit. Patient/Guardian expressed understanding and agreed to proceed.   Staci LOISE Kerns, NP 10/14/20251:12 PM
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
## 2024-09-19 NOTE — Psych (Signed)
 Active     OP Depression     LTG: Reduce frequency, intensity, and duration of depression symptoms so that daily functioning is improved (Initial)     Start:  09/19/24    Expected End:  12/20/24         LTG: Increase coping skills to manage depression and improve ability to perform daily activities (Initial)     Start:  09/19/24    Expected End:  12/20/24         STG: Stephanie Riley will attend at least 80% of scheduled PHP sessions    (Initial)     Start:  09/19/24    Expected End:  12/20/24         STG: Stephanie Riley will complete at least 80% of assigned homework  (Initial)     Start:  09/19/24    Expected End:  12/20/24         STG: Stephanie Riley will identify cognitive patterns and beliefs that support depression (Initial)     Start:  09/19/24    Expected End:  12/20/24         Encourage Stephanie Riley to participate in recovery peer support activities weekly      Start:  09/19/24         Work with Stephanie Riley to identify the major components of a recent episode of depression: physical symptoms, major thoughts and images, and major behaviors they experienced     Start:  09/19/24         Therapist will educate patient on cognitive distortions and the rationale for treatment of depression     Start:  09/19/24         Stephanie Riley will identify AT LEAST 3 cognitive distortions they are currently using and write reframing statements to replace them     Start:  09/19/24         Therapist will review PLEASE Skills (Treat Physical Illness, Balance Eating, Avoid Mood-Altering Substances, Balance Sleep and Get Exercise) with patient     Start:  09/19/24           Stephanie Riley verbally agrees to treatment plan.

## 2024-09-20 ENCOUNTER — Ambulatory Visit (HOSPITAL_COMMUNITY)

## 2024-09-20 ENCOUNTER — Ambulatory Visit (INDEPENDENT_AMBULATORY_CARE_PROVIDER_SITE_OTHER): Admitting: Licensed Clinical Social Worker

## 2024-09-20 DIAGNOSIS — F332 Major depressive disorder, recurrent severe without psychotic features: Secondary | ICD-10-CM | POA: Diagnosis not present

## 2024-09-20 DIAGNOSIS — F411 Generalized anxiety disorder: Secondary | ICD-10-CM

## 2024-09-20 DIAGNOSIS — R4589 Other symptoms and signs involving emotional state: Secondary | ICD-10-CM

## 2024-09-21 ENCOUNTER — Ambulatory Visit (INDEPENDENT_AMBULATORY_CARE_PROVIDER_SITE_OTHER): Admitting: Licensed Clinical Social Worker

## 2024-09-21 DIAGNOSIS — F411 Generalized anxiety disorder: Secondary | ICD-10-CM

## 2024-09-21 DIAGNOSIS — F332 Major depressive disorder, recurrent severe without psychotic features: Secondary | ICD-10-CM | POA: Diagnosis not present

## 2024-09-22 ENCOUNTER — Ambulatory Visit (INDEPENDENT_AMBULATORY_CARE_PROVIDER_SITE_OTHER): Admitting: Licensed Clinical Social Worker

## 2024-09-22 DIAGNOSIS — F332 Major depressive disorder, recurrent severe without psychotic features: Secondary | ICD-10-CM

## 2024-09-22 DIAGNOSIS — F411 Generalized anxiety disorder: Secondary | ICD-10-CM

## 2024-09-25 ENCOUNTER — Ambulatory Visit (INDEPENDENT_AMBULATORY_CARE_PROVIDER_SITE_OTHER): Admitting: Licensed Clinical Social Worker

## 2024-09-25 DIAGNOSIS — F411 Generalized anxiety disorder: Secondary | ICD-10-CM

## 2024-09-25 DIAGNOSIS — F332 Major depressive disorder, recurrent severe without psychotic features: Secondary | ICD-10-CM

## 2024-09-25 NOTE — Psych (Signed)
 [facility] BH PHP THERAPIST PROGRESS NOTE  Stephanie Riley [id number]   Session Time: 9:00 am - 10:00 am  Participation Level: Active  Behavioral Response: CasualAlertAnxious and Depressed  Type of Therapy: Group Therapy  Treatment Goals addressed: Coping  Progress Towards Goals: Progressing  Interventions: CBT, DBT, Solution Focused, Strength-based, Supportive, and Reframing  Therapist Response: Clinician led check-in regarding current stressors and situation, and review of patient completed daily inventory. Clinician utilized active listening and empathetic response and validated patient emotions. Clinician facilitated processing group on pertinent issues.?   Summary: Patient arrived within time allowed. Patient rates their depression at a 8 and anxiety at a 7 on a scale of 1-10 with 10 being best. Pt reports she is feeling somewhat better and shares that her weekend was uneventful. She discusses wanting others to understand how pain leads to drinking, but was unable to articulate what result she is looking for. Pt had difficulty completing statements before starting new ones. When asked about sleep and appetite, pt reports they slept 7 hours last night and ate 3 meals yesterday. Pt denied experiencing SI/SH thoughts and endorsed feelings of hopelessness since last session related to making real connections. Pt able to process.?Pt engaged in discussion.?      Session Time: 10:00 am - 11:00 am  Participation Level: Active  Behavioral Response: CasualAlertAnxious and Depressed  Type of Therapy: Group Therapy  Treatment Goals addressed: Coping  Progress Towards Goals: Progressing  Interventions: CBT, DBT, Solution Focused, Strength-based, Supportive, and Reframing  Therapist Response: Clinician led processing group for pt's current struggles. Group members shared stressors and provided support and feedback. Clinician brought in topics of acceptance and respecting others'  boundaries to inform discussion.  Summary: Pt able to process and provide support to group. Pt demonstrates poor insight into her reported stressors and relationships.    Session Time: 11:00 am - 12:00 pm  Participation Level: Active  Behavioral Response: CasualAlertAnxious and Depressed  Type of Therapy: Group Therapy  Treatment Goals addressed: Coping  Progress Towards Goals: Progressing  Interventions: CBT, DBT, Solution Focused, Strength-based, Supportive, and Reframing  Therapist Response: Group viewed Stephanie Riley entitled How To Not Take Things Personally presented by Stephanie Riley. Cln utilized CBT principles to inform discussion while pts were encouraged to share their response to the video. Cln asked each patient to share a time in which they took things personally and reframed the situation based on what was shared in the Stephanie Riley.   Summary: Pt engaged in discussion. Pt demonstrated fair insight into the subject matter.   Session Time: 12:00 pm - 12:30 pm  Participation Level: Active  Behavioral Response: CasualAlertAnxious and Depressed  Type of Therapy: Group Therapy  Treatment Goals addressed: Coping  Progress Towards Goals: Progressing  Interventions: Psychologist, occupational, Supportive  Therapist Response: Reflection Group: Patients encouraged to practice skills and interpersonal techniques or work on mindfulness and relaxation techniques. The importance of self-care and making skills part of a routine to increase usage were stressed.  Summary: Patient engaged and participated appropriately.   Session Time: 12:30 pm - 1:30 pm  Participation Level: Active  Behavioral Response: CasualAlertAnxious and Depressed  Type of Therapy: Group Therapy  Treatment Goals addressed: Coping  Progress Towards Goals: Progressing  Interventions: CBT, DBT, Solution Focused, Strength-based, Supportive, and Reframing  Therapist Response: Group was led by occupational  therapist, Stephanie Riley.   Summary: Pt engaged and participated in discussion.   Session Time: 1:30 pm - 1:40 pm  Participation Level: Active  Behavioral Response: CasualAlertAnxious and Depressed  Type of Therapy: Group Therapy  Treatment Goals addressed: Coping  Progress Towards Goals: Progressing  Interventions: CBT, DBT, Solution Focused, Strength-based, Supportive, and Reframing  Therapist Response: 1:30 pm - 1:40 pm: Clinician led check-out. Clinician assessed for immediate needs, medication compliance and efficacy, and safety concerns. Group was dismissed at 1:40 pm due to working through break.  Summary: At check-out, patient contracts for safety.?Patient demonstrates progress as evidenced by her continued engagement and by being receptive to treatment. Patient denies SI/HI/self-harm thoughts at the end of group and agrees to seek help should those thoughts/feelings occur.?    Suicidal/Homicidal: Nowithout intent/plan  Plan: ?Pt Stephanie continue in PHP and medication management while continuing to work on decreasing depression symptoms,?SI, and anxiety symptoms,?and increasing the ability to self manage symptoms.     Collaboration of Care: Medication Management AEB Stephanie Kerns, NP  Patient/Guardian was advised Release of Information must be obtained prior to any record release in order to collaborate their care with an outside provider. Patient/Guardian was advised if they have not already done so to contact the registration department to sign all necessary forms in order for us  to release information regarding their care.   Consent: Patient/Guardian gives verbal consent for treatment and assignment of benefits for services provided during this visit. Patient/Guardian expressed understanding and agreed to proceed.   Diagnosis: Severe episode of recurrent major depressive disorder, without psychotic features (HCC) [F33.2]    1. Severe episode of recurrent major depressive  disorder, without psychotic features (HCC)   2. GAD (generalized anxiety disorder)       Stephanie LILLETTE Pollack, LCSW 09/25/2024

## 2024-09-26 ENCOUNTER — Ambulatory Visit (INDEPENDENT_AMBULATORY_CARE_PROVIDER_SITE_OTHER): Admitting: Licensed Clinical Social Worker

## 2024-09-26 DIAGNOSIS — F332 Major depressive disorder, recurrent severe without psychotic features: Secondary | ICD-10-CM

## 2024-09-26 DIAGNOSIS — F411 Generalized anxiety disorder: Secondary | ICD-10-CM

## 2024-09-26 NOTE — Progress Notes (Signed)
 Spoke with patient in person for PHP. States that groups are going well. Her psychiatrist put her on Seroquel but she has not picked it up from the pharmacy because she feels that it is too much medication. States that she wakes up at 5am but doesn't want to take more medication. On scale 1-10 as 10 being worst she rates depression at 5 and anxiety at 5. Denies SI/HI or AVH. No issues or complaints. Pleasant, cooperative, bright cheerful affect.
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
## 2024-09-27 ENCOUNTER — Ambulatory Visit (HOSPITAL_COMMUNITY): Admitting: Licensed Clinical Social Worker

## 2024-09-27 DIAGNOSIS — F332 Major depressive disorder, recurrent severe without psychotic features: Secondary | ICD-10-CM | POA: Diagnosis not present

## 2024-09-27 DIAGNOSIS — F411 Generalized anxiety disorder: Secondary | ICD-10-CM

## 2024-09-27 NOTE — Progress Notes (Signed)
 BH MD/PA/NP OP Progress Note  09/27/2024 1:21 PM Stephanie Riley  MRN:  [id number]  Chief Complaint: Weekly assessment/PHP  HPI: Stephanie Riley is a 73 year old female who was seen and evaluated face-to-face by this provider.  Patient is currently attending partial hospitalization outpatient programming.  She carries a diagnosis related to major depressive disorder and generalized anxiety disorder.  Has a history of substance abuse to alcohol dependency.  States she has been sober since March/2025.  Denies any cravings however states she utilizing Ativan  more frequently.  Sounds like she has attempted to self taper from Ativan  however was unable to elaborate on scheduled time that she takes medication.  Encouraged to bring medication to follow-up appointment.  Stephanie Riley she denied suicidal or homicidal ideations.  Denied auditory visual hallucinations.  Does report early morning wakening however states she is able to go back to sleep and about 30 to 40 minutes after she is awakened.  She is currently taking Seroquel, Wellbutrin, trazodone  and mirtazapine .  She reports short-term memory loss and poor concentration.  Suspect symptoms is attributed to long-term alcohol misuse.  No other concerns noted at this visit.  Reports a good appetite.  Will continue to monitor symptoms.  Support, encouragement and reassurance were provided.   Visit Diagnosis:    ICD-10-CM   1. Severe episode of recurrent major depressive disorder, without psychotic features (HCC)  F33.2     2. GAD (generalized anxiety disorder)  F41.1       Past Psychiatric History:   Past Medical History:  Past Medical History:  Diagnosis Date   Alcohol abuse    Anxiety    Benzodiazepine withdrawal without complication (HCC) 02/12/2023   Depression    HTN (hypertension)    PTSD (post-traumatic stress disorder)     Past Surgical History:  Procedure Laterality Date   CYSTECTOMY Left    breast cyst removed many years ago     Family Psychiatric History:   Family History: No family history on file.  Social History:  Social History   Socioeconomic History   Marital status: Divorced    Spouse name: Not on file   Number of children: 1   Years of education: Not on file   Highest education level: Bachelor's degree (e.g., BA, AB, BS)  Occupational History   Not on file  Tobacco Use   Smoking status: Never   Smokeless tobacco: Never  Vaping Use   Vaping status: Never Used  Substance and Sexual Activity   Alcohol use: Not Currently    Comment: sober since March 7th 2025   Drug use: Never   Sexual activity: Not Currently  Other Topics Concern   Not on file  Social History Narrative   Not on file   Social Drivers of Health   Financial Resource Strain: Low Risk  (10/19/2022)   Received from [facility]   Overall Financial Resource Strain (CARDIA)    Difficulty of Paying Living Expenses: Not hard at all  Food Insecurity: No Food Insecurity (02/12/2024)   Hunger Vital Sign    Worried About Running Out of Food in the Last Year: Never true    Ran Out of Food in the Last Year: Never true  Transportation Needs: No Transportation Needs (02/12/2024)   PRAPARE - Administrator, Civil Service (Medical): No    Lack of Transportation (Non-Medical): No  Physical Activity: Insufficiently Active (10/19/2022)   Received from [facility]   Exercise Vital Sign    On average,  how many days per week do you engage in moderate to strenuous exercise (like a brisk walk)?: 3 days    On average, how many minutes do you engage in exercise at this level?: 20 min  Stress: No Stress Concern Present (10/19/2022)   Received from [facility] - Occupational Stress Questionnaire    Feeling of Stress : Not at all  Social Connections: Moderately Isolated (02/12/2024)   Social Connection and Isolation Panel    Frequency of Communication with Friends and Family: Once a week     Frequency of Social Gatherings with Friends and Family: Once a week    Attends Religious Services: 1 to 4 times per year    Active Member of Clubs or Organizations: No    Attends Banker Meetings: 1 to 4 times per year    Marital Status: Divorced    Allergies: No Known Allergies  Metabolic Disorder Labs: Lab Results  Component Value Date   HGBA1C 5.2 02/18/2024   MPG 102.54 02/18/2024   MPG 93.93 05/25/2023   No results found for: PROLACTIN Lab Results  Component Value Date   CHOL 162 02/18/2024   TRIG 85 02/18/2024   HDL 49 02/18/2024   CHOLHDL 3.3 02/18/2024   VLDL 17 02/18/2024   LDLCALC 96 02/18/2024   LDLCALC 41 05/25/2023   Lab Results  Component Value Date   TSH 3.122 02/10/2024   TSH 1.830 05/25/2023    Therapeutic Level Labs: No results found for: LITHIUM No results found for: VALPROATE No results found for: CBMZ  Current Medications: Current Outpatient Medications  Medication Sig Dispense Refill   amLODipine  (NORVASC ) 10 MG tablet Take 1 tablet (10 mg total) by mouth daily. 30 tablet 0   ARIPiprazole  (ABILIFY ) 5 MG tablet Take 1 tablet (5 mg total) by mouth at bedtime. (Patient not taking: Reported on 09/19/2024) 30 tablet 0   B Complex Vitamins (B COMPLEX PO) Take 1 tablet by mouth daily.     buPROPion (WELLBUTRIN XL) 150 MG 24 hr tablet Take 150 mg by mouth every morning.     busPIRone  (BUSPAR ) 15 MG tablet Take 1 tablet (15 mg total) by mouth 3 (three) times daily. (Patient taking differently: Take 30 mg by mouth 2 (two) times daily.) 90 tablet 0   doxepin  (SINEQUAN ) 50 MG capsule Take 1 capsule (50 mg total) by mouth at bedtime. (Patient taking differently: Take 6 mg by mouth at bedtime.) 30 capsule 0   ferrous sulfate 325 (65 FE) MG tablet Take 325 mg by mouth every morning.     gabapentin  (NEURONTIN ) 100 MG capsule Take 1 capsule (100 mg total) by mouth 2 (two) times daily. (Patient not taking: Reported on 09/19/2024) 60 capsule  0   gabapentin  (NEURONTIN ) 300 MG capsule Take 1 capsule (300 mg total) by mouth at bedtime. (Patient not taking: Reported on 09/19/2024) 30 capsule 0   hydrOXYzine  (ATARAX ) 25 MG tablet Take 1 tablet (25 mg total) by mouth every 6 (six) hours as needed for anxiety. (Patient not taking: Reported on 09/19/2024) 30 tablet 0   [START ON 10/07/2024] LORazepam  (ATIVAN ) 0.5 MG tablet Take 0.5-1 mg by mouth 4 (four) times daily as needed.     mirtazapine  (REMERON  SOLTAB) 45 MG disintegrating tablet Take 1 tablet (45 mg total) by mouth at bedtime. 90 tablet 0   Multiple Vitamins-Minerals (ADULT GUMMY PO) Take 2 tablets by mouth daily.     sertraline  (ZOLOFT ) 100 MG tablet  Take 2 tablets (200 mg total) by mouth daily. 30 tablet 0   traZODone  (DESYREL ) 100 MG tablet Take 1 tablet (100 mg total) by mouth at bedtime. 30 tablet 0   No current facility-administered medications for this visit.     Musculoskeletal: Strength & Muscle Tone: within normal limits Gait & Station: normal Patient leans: N/A  Psychiatric Specialty Exam: Review of Systems  There were no vitals taken for this visit.There is no height or weight on file to calculate BMI.  General Appearance: Casual  Eye Contact:  Good  Speech:  Clear and Coherent  Volume:  Normal  Mood:  Anxious and Depressed  Affect:  Congruent  Thought Process:  Coherent  Orientation:  Full (Time, Place, and Person)  Thought Content: Logical   Suicidal Thoughts:  No  Homicidal Thoughts:  No  Memory:  Immediate;   Good Recent;   Good  Judgement:  Good  Insight:  Good  Psychomotor Activity:  Normal  Concentration:  Concentration: Good  Recall:  Good  Fund of Knowledge: Good  Language: Good  Akathisia:  No  Handed:  Right  AIMS (if indicated): not done  Assets:  Communication Skills Desire for Improvement  ADL's:  Intact  Cognition: WNL  Sleep:  Good   Screenings: AUDIT    Flowsheet Row Admission (Discharged) from 02/12/2024 in [facility]  BEHAVIORAL MEDICINE ED from 05/25/2023 in [facility] Emergency Department at [facility]  Alcohol Use Disorder Identification Test Final Score (AUDIT) 9 27   GAD-7    Flowsheet Row Counselor from 09/25/2024 in [facility] Counselor from 09/19/2024 in [facility]  Total GAD-7 Score 15 16   PHQ2-9    Flowsheet Row Counselor from 09/25/2024 in [facility] Counselor from 09/19/2024 in [facility] Counselor from 09/04/2024 in [facility] ED from 02/10/2024 in [facility] ED from 05/25/2023 in [facility]  PHQ-2 Total Score 6 6 6 6 6   PHQ-9 Total Score 15 15 17 24 9    Flowsheet Row Admission (Discharged) from 02/12/2024 in [facility] BEHAVIORAL [facility]ICINE ED from 02/10/2024 in [facility] ED to Hosp-Admission (Discharged) from 09/03/2023 in [place] LONG 4TH FLOOR PROGRESSIVE CARE AND UROLOGY  C-SSRS RISK CATEGORY No Risk No Risk No Risk     Assessment and Plan:  Continue partial hospitalization outpatient programming PHP Continue medications as directed Continue to reinforce taking medication as directed   Collaboration of Care: Collaboration of Care: Medication Management AEB bring medications at follow-up assessment  Patient/Guardian was advised Release of Information must be obtained prior to any record release in order to collaborate their care with an outside provider. Patient/Guardian was advised if they have not already done so to contact the registration department to sign all necessary forms in order for us  to release information regarding their care.   Consent: Patient/Guardian gives verbal consent for treatment and assignment of benefits for services provided during this visit. Patient/Guardian expressed understanding and agreed to proceed.    Staci LOISE Kerns, NP 09/27/2024, 1:21 PM

## 2024-09-28 ENCOUNTER — Ambulatory Visit (HOSPITAL_COMMUNITY)

## 2024-09-29 ENCOUNTER — Ambulatory Visit (INDEPENDENT_AMBULATORY_CARE_PROVIDER_SITE_OTHER): Admitting: Licensed Clinical Social Worker

## 2024-09-29 DIAGNOSIS — F332 Major depressive disorder, recurrent severe without psychotic features: Secondary | ICD-10-CM

## 2024-09-29 DIAGNOSIS — F411 Generalized anxiety disorder: Secondary | ICD-10-CM

## 2024-10-01 ENCOUNTER — Encounter (HOSPITAL_COMMUNITY): Payer: Self-pay

## 2024-10-01 NOTE — Therapy (Signed)
  [facility] [address] [place], [place], [address] Phone: [phone]   Fax:  [phone]  Occupational Therapy Evaluation  Patient Details  Name: Stephanie Riley MRN: [id number] Date of Birth: February 04, 1951 No data recorded  Encounter Date: 09/19/2024   OT End of Session - 10/01/24 1248     Visit Number 1    Number of Visits 15    Date for Recertification  10/09/24    OT Start Time 1230    OT Stop Time 1330    OT Time Calculation (min) 60 min          Past Medical History:  Diagnosis Date   Alcohol abuse    Anxiety    Benzodiazepine withdrawal without complication (HCC) 02/12/2023   Depression    HTN (hypertension)    PTSD (post-traumatic stress disorder)     Past Surgical History:  Procedure Laterality Date   CYSTECTOMY Left    breast cyst removed many years ago    There were no vitals filed for this visit.   Subjective Assessment - 10/01/24 1247     Subjective  I'd like to learn new coping skills while I am here to help me better manage my daily tasks.    Pertinent History MDD, GAD    Currently in Pain? No/denies    Pain Score 0-No pain            OT Assessment OCAIRS Mental Health Interview Summary of Client Scores:  Facilitates participation in occupation Allows participation in occupation Inhibits participation in occupation Restricts participation in occupation Comments:  Roles   X    Habits    X   Personal Causation   X    Values   X    Interests   X    Skills    X   Short-Term Goals    X   Long-term Goals   X    Interpretation of Past Experiences  X     Physical Environment  X     Social Environment    X   Readiness for Change  X       Need for Occupational Therapy:  4 Shows positive occupational participation, no need for OT.   3 Need for minimal intervention/consultative participation   2 Need for OT intervention indicated to restore/improve participation   1 Need for extensive OT intervention  indicated to improve participation.  Referral for follow up services also recommended.   Assessment:  Patient demonstrates behavior that INHIBITS participation in occupation.  Patient will benefit from occupational therapy intervention in order to improve time management, financial management, stress management, job readiness skills, social skills, and health management skills in preparation to return to full time community living and to be a productive community member.    Plan:  Patient will participate in skilled occupational therapy sessions individually or in a group setting to improve coping skills, psychosocial skills, and emotional skills required to return to prior level of function. Treatment will be 4-5 times per week for 3 weeks.     Group Session: O: During today's OT group session, the patient participated in an educational segment about the importance of goal-setting and the application of the SMART framework to enhance daily life, particularly focusing on ADLs and iADLs. The session began with five open-ended pre-session questions that facilitated group discussion and introspection about their current relationship with goals. Following the introduction and educational segment, participants engaged in brainstorming and group discussions to  devise hypothetical SMART goals. The session concluded with five post-session questions to reinforce understanding and facilitate reflection. Throughout the session, there was a range of engagement levels noted among the participants.   A:  Patient demonstrated a high level of engagement throughout the session. They actively participated in discussions, sharing personal experiences related to goal setting and challenges faced. Patient was able to clearly articulate an understanding of the SMART framework and proposed personal SMART goals related to their own ADLs with minimal assistance. They expressed enthusiasm about applying what they learned to their  daily routine and appeared motivated to make changes.                     OT Education - 10/01/24 1248     Education Details OCAIRS / Tx: SMART Goals    Person(s) Educated Patient           OT Short Term Goals - 10/01/24 1251       OT SHORT TERM GOAL #1   Title Patient will identify and demonstrate at least two grounding or coping strategies to manage emotional distress, reporting decreased intensity of negative thoughts or anxiety during group participation and daily activities.    Time 4    Period Weeks    Status On-going    Target Date 10/09/24      OT SHORT TERM GOAL #2   Title atient will develop and implement a structured daily routine that incorporates at least one productive, one self-care, and one leisure activity to improve consistency, balance, and sense of purpose throughout the week.    Status On-going      OT SHORT TERM GOAL #3   Title Patient will identify one personal goal related to daily functioning or wellness and apply the SMART goal framework to create a clear, measurable, and achievable plan to improve engagement in meaningful roles.    Status On-going                   Plan - 10/01/24 1250     Clinical Impression Statement Pt presents w/ deficits across multiple psychosocial doamins that inhibit participation in daily activities and overall occupational performance    OT Occupational Profile and History Problem Focused Assessment - Including review of records relating to presenting problem    Occupational performance deficits (Please refer to evaluation for details): IADL's;Rest and Sleep;Work;Leisure;Social Participation    Psychosocial Skills Interpersonal Interaction;Habits;Environmental  Adaptations;Routines and Behaviors;Coping Strategies    Rehab Potential Good    Clinical Decision Making Limited treatment options, no task modification necessary    Comorbidities Affecting Occupational Performance: None    Modification or  Assistance to Complete Evaluation  No modification of tasks or assist necessary to complete eval    OT Frequency 5x / week    OT Duration 4 weeks    OT Treatment/Interventions Psychosocial skills training;Coping strategies training          Patient will benefit from skilled therapeutic intervention in order to improve the following deficits and impairments:       Psychosocial Skills: Interpersonal Interaction, Habits, Environmental  Adaptations, Routines and Behaviors, Coping Strategies   Visit Diagnosis: Difficulty coping  Severe episode of recurrent major depressive disorder, without psychotic features (HCC)  GAD (generalized anxiety disorder)  Moderate episode of recurrent major depressive disorder (HCC)  Benzodiazepine withdrawal with delirium [facility])    Problem List Patient Active Problem List   Diagnosis Date Noted   Anxiety disorder 02/21/2024   MDD (  major depressive disorder), recurrent episode 02/12/2024   Alcohol use disorder, severe, in early remission (HCC) 10/22/2023   GAD (generalized anxiety disorder) 10/22/2023   Severe episode of recurrent major depressive disorder, without psychotic features (HCC) 10/22/2023   Hypophosphatemia 09/05/2023   Alcoholic ketoacidosis 09/04/2023   Lactic acidosis 09/04/2023   SIRS (systemic inflammatory response syndrome) (HCC) 09/04/2023   Transaminitis 09/04/2023   Elevated CK 09/04/2023   Hypokalemia 09/04/2023   Hypocalcemia 09/04/2023   COVID-19 virus infection 09/04/2023   Essential hypertension 09/04/2023   Aspiration into airway 09/04/2023   Alcohol use disorder 03/19/2023   Alcohol use disorder, moderate, dependence (HCC) 02/12/2023    [name], OT 10/01/2024, 12:54 PM  [name], OT   [facility] [address] [place], [place], [address] Phone: [phone]   Fax:  [phone]  Name: Stephanie Riley MRN: [id number] Date of Birth: 09/03/1951

## 2024-10-01 NOTE — Therapy (Signed)
 [facility] [address] [place], [place], [address] Phone: [phone]   Fax:  [phone]  Occupational Therapy Treatment  Patient Details  Name: Stephanie Riley MRN: [id number] Date of Birth: 1951/04/28 No data recorded  Encounter Date: 09/20/2024   OT End of Session - 10/01/24 1319     Visit Number 2    Number of Visits 15    Date for Recertification  10/09/24    OT Start Time 1230    OT Stop Time 1330    OT Time Calculation (min) 60 min          Past Medical History:  Diagnosis Date   Alcohol abuse    Anxiety    Benzodiazepine withdrawal without complication (HCC) 02/12/2023   Depression    HTN (hypertension)    PTSD (post-traumatic stress disorder)     Past Surgical History:  Procedure Laterality Date   CYSTECTOMY Left    breast cyst removed many years ago    There were no vitals filed for this visit.   Subjective Assessment - 10/01/24 1319     Currently in Pain? No/denies    Pain Score 0-No pain            Group Session:  S: Doing okay today  O: During today's OT group session, the patient participated in an educational segment about the importance of goal-setting and the application of the SMART framework to enhance daily life, particularly focusing on ADLs and iADLs. The session began with five open-ended pre-session questions that facilitated group discussion and introspection about their current relationship with goals. Following the introduction and educational segment, participants engaged in brainstorming and group discussions to devise hypothetical SMART goals. The session concluded with five post-session questions to reinforce understanding and facilitate reflection. Throughout the session, there was a range of engagement levels noted among the participants.   A:  Patient demonstrated a high level of engagement throughout the session. They actively participated in discussions, sharing personal  experiences related to goal setting and challenges faced. Patient was able to clearly articulate an understanding of the SMART framework and proposed personal SMART goals related to their own ADLs with minimal assistance. They expressed enthusiasm about applying what they learned to their daily routine and appeared motivated to make changes.    P: Continue to attend PHP OT group sessions 5x week for 4 weeks to promote daily structure, social engagement, and opportunities to develop and utilize adaptive strategies to maximize functional performance in preparation for safe transition and integration back into school, work, and the community. Plan to address topic of tbd in next OT group session.                      OT Education - 10/01/24 1319     Education Details SMART Goals           OT Short Term Goals - 10/01/24 1251       OT SHORT TERM GOAL #1   Title Patient will identify and demonstrate at least two grounding or coping strategies to manage emotional distress, reporting decreased intensity of negative thoughts or anxiety during group participation and daily activities.    Time 4    Period Weeks    Status On-going    Target Date 10/09/24      OT SHORT TERM GOAL #2   Title atient will develop and implement a structured daily routine that incorporates at least one productive, one self-care,  and one leisure activity to improve consistency, balance, and sense of purpose throughout the week.    Status On-going      OT SHORT TERM GOAL #3   Title Patient will identify one personal goal related to daily functioning or wellness and apply the SMART goal framework to create a clear, measurable, and achievable plan to improve engagement in meaningful roles.    Status On-going                   Plan - 10/01/24 1319     Psychosocial Skills Interpersonal Interaction;Habits;Environmental  Adaptations;Routines and Behaviors;Coping Strategies          Patient  will benefit from skilled therapeutic intervention in order to improve the following deficits and impairments:       Psychosocial Skills: Interpersonal Interaction, Habits, Environmental  Adaptations, Routines and Behaviors, Coping Strategies   Visit Diagnosis: Difficulty coping    Problem List Patient Active Problem List   Diagnosis Date Noted   Anxiety disorder 02/21/2024   MDD (major depressive disorder), recurrent episode 02/12/2024   Alcohol use disorder, severe, in early remission (HCC) 10/22/2023   GAD (generalized anxiety disorder) 10/22/2023   Severe episode of recurrent major depressive disorder, without psychotic features (HCC) 10/22/2023   Hypophosphatemia 09/05/2023   Alcoholic ketoacidosis 09/04/2023   Lactic acidosis 09/04/2023   SIRS (systemic inflammatory response syndrome) (HCC) 09/04/2023   Transaminitis 09/04/2023   Elevated CK 09/04/2023   Hypokalemia 09/04/2023   Hypocalcemia 09/04/2023   COVID-19 virus infection 09/04/2023   Essential hypertension 09/04/2023   Aspiration into airway 09/04/2023   Alcohol use disorder 03/19/2023   Alcohol use disorder, moderate, dependence (HCC) 02/12/2023    Stephanie Riley, OT 10/01/2024, 1:20 PM  Stephanie Riley, OT   [facility] [address] [place], [place], [address] Phone: [phone]   Fax:  [phone]  Name: Stephanie Riley MRN: [id number] Date of Birth: [birth year]

## 2024-10-03 ENCOUNTER — Ambulatory Visit (HOSPITAL_COMMUNITY)

## 2024-10-03 ENCOUNTER — Ambulatory Visit (INDEPENDENT_AMBULATORY_CARE_PROVIDER_SITE_OTHER)

## 2024-10-03 DIAGNOSIS — F332 Major depressive disorder, recurrent severe without psychotic features: Secondary | ICD-10-CM

## 2024-10-03 DIAGNOSIS — R4589 Other symptoms and signs involving emotional state: Secondary | ICD-10-CM

## 2024-10-03 DIAGNOSIS — F411 Generalized anxiety disorder: Secondary | ICD-10-CM

## 2024-10-03 NOTE — Psych (Signed)
 [facility] PHP THERAPIST PROGRESS NOTE  Stephanie Riley [id number]   Session Time: 9:00 am - 10:00 am  Participation Level: Active  Behavioral Response: CasualAlertAnxious and Depressed  Type of Therapy: Group Therapy  Treatment Goals addressed: Coping  Progress Towards Goals: Progressing  Interventions: CBT, DBT, Solution Focused, Strength-based, Supportive, and Reframing  Therapist Response: Clinician led check-in regarding current stressors and situation, and review of patient completed daily inventory. Clinician utilized active listening and empathetic response and validated patient emotions. Clinician facilitated processing group on pertinent issues.?   Summary: Patient arrived within time allowed. Patient rates their depression at a 7 and anxiety at a 7 on a scale of 1-10 with 10 being best. When asked about sleep and appetite, pt reports they slept 8 hours last night and ate 3-4x yesterday. Pt denied experiencing SI/SH thoughts, but endorses feelings of hopelessness since last session. She reports these thoughts are related to what her future looks like. She reports she is struggling with interactions and setting/maintaining boundaries with her sponsor in [place], her best friend from college, and her ex-husband. Pt able to process.?Pt engaged in discussion.?      Session Time: 10:00 am - 11:00 am  Participation Level: Active  Behavioral Response: CasualAlertAnxious and Depressed  Type of Therapy: Group Therapy  Treatment Goals addressed: Coping  Progress Towards Goals: Progressing  Interventions: CBT, DBT, Solution Focused, Strength-based, Supportive, and Reframing  Therapist Response: Clinician led processing group for pt's current struggles. Group members shared stressors and provided support and feedback. Clinician brought in topics of resiliency, self-esteem, mindfulness and grounding techniques, and the power of getting to choose what to share with others.  Summary: Pt  able to process and provide support to group. Pt reports she struggles with getting her self-esteem from outside of herself and wanting to find her confidence within herself again.      Session Time: 11:00 am - 12:00 pm   Participation Level: Active   Behavioral Response: Casual Alert and Anxious/Depressed   Type of Therapy: Group Therapy   Treatment Goals addressed: Coping   Progress Towards Goals: Progressing   Interventions: CBT, DBT, Solution Focused, Strength-based, Supportive, and Reframing   Therapist Response: Group was led by [facility] chaplain, Amy Delores.   Summary: Pt engaged in discussion.      Session Time: 12:00 pm - 12:30 pm   Participation Level: Active   Behavioral Response: Casual Alert and Anxious/Depressed   Type of Therapy: Group Therapy   Treatment Goals addressed: Coping   Progress Towards Goals: Progressing   Interventions: Psychologist, Occupational, Supportive   Therapist Response: Reflection Group: Patients encouraged to practice skills and interpersonal techniques or work on mindfulness and relaxation techniques. The importance of self-care and making skills part of a routine to increase usage were stressed.   Summary: Pt engaged in discussion.      Session Time: 12:30 pm - 1:30 pm   Participation Level: Active   Behavioral Response: Casual Alert and Anxious/Depressed   Type of Therapy: Group Therapy   Treatment Goals addressed: Coping   Progress Towards Goals: Progressing   Interventions: OT group   Therapist Response: Group was led by occupational therapist, Edward Hollan.    Summary: Pt engaged in discussion.            Session Time: 1:30 pm - 2:00pm   Participation Level: Active   Behavioral Response: Casual Alert and Anxious/Depressed   Type of Therapy: Group Therapy   Treatment Goals addressed: Coping  Progress Towards Goals: Progressing   Interventions: CBT, DBT, Solution Focused, Strength-based,  Supportive, and Reframing   Therapist Response: 1:30-150pm: Cln led group on discussing and practicing grounding techniques including 5-4-3-2-1, categories game, counting backwards, box breathing, and others. 1:50 - 2:00 pm: Clinician led check-out. Clinician assessed for immediate needs, medication compliance and efficacy, and safety concerns?   Summary: 1:30-1:50pm: Pt engaged in activities and discussion. 1:50 pm - 2:00 pm: At check-out, patient reports no immediate concerns. Patient demonstrates progress as evidenced by engagement and responsiveness to treatment. Patient denies SI/HI/self-harm thoughts at the end of group.   Suicidal/Homicidal: Nowithout intent/plan  Plan: ?Pt will continue in PHP and medication management while continuing to work on decreasing depression symptoms,?SI, and anxiety symptoms,?and increasing the ability to self manage symptoms.    Collaboration of Care: Medication Management AEB Staci Kerns, NP and Other RN Hildegard Macadam  Patient/Guardian was advised Release of Information must be obtained prior to any record release in order to collaborate their care with an outside provider. Patient/Guardian was advised if they have not already done so to contact the registration department to sign all necessary forms in order for us  to release information regarding their care.   Consent: Patient/Guardian gives verbal consent for treatment and assignment of benefits for services provided during this visit. Patient/Guardian expressed understanding and agreed to proceed.   Diagnosis: Severe episode of recurrent major depressive disorder, without psychotic features (HCC) [F33.2]    1. Severe episode of recurrent major depressive disorder, without psychotic features (HCC)   2. GAD (generalized anxiety disorder)       Stephanie Riley, [facility] 10/03/2024

## 2024-10-04 ENCOUNTER — Ambulatory Visit (HOSPITAL_COMMUNITY)

## 2024-10-04 ENCOUNTER — Ambulatory Visit (INDEPENDENT_AMBULATORY_CARE_PROVIDER_SITE_OTHER): Admitting: Licensed Clinical Social Worker

## 2024-10-04 DIAGNOSIS — F411 Generalized anxiety disorder: Secondary | ICD-10-CM

## 2024-10-04 DIAGNOSIS — R4589 Other symptoms and signs involving emotional state: Secondary | ICD-10-CM

## 2024-10-04 DIAGNOSIS — F332 Major depressive disorder, recurrent severe without psychotic features: Secondary | ICD-10-CM

## 2024-10-04 NOTE — Progress Notes (Addendum)
 BH MD/PA/NP P Progress Note  10/04/2024 2:53 PM Stephanie Riley  MRN:  [id number]  Chief Complaint:  Chief Complaint  Patient presents with   Depression   Anxiety   HPI: Stephanie Riley 73 year old female was seen and evaluated face-to-face by this provider.  She carries a diagnosis related to major depressive disorder generalized anxiety disorder.  Seen and evaluated for weekly progress while attending partial hospitalization programming.  Reports overall,  her mood has improved since attending the program.  [facility].  She reports some concerns related to sleep disturbance.  States she is currently prescribed mirtazapine , trazodone , Seroquel and doxepin  3mg  to 6 mg.  Reported ongoing issues related to sleep disturbance.  Maud can be difficult to follow-up regarding medication history as she reports self titration.  Unsure which medication she was supposed to discontinue.  Discussed bringing pill bottles to follow-up appointment on 10/30.  Tatelyn retrieved a list of current medications from my chart. Plans to follow-up with her medication management provider. - Patient appears to be a poor historian and continues to self titrate medications.  Would follow-up with medication provider for current medication doses and schedule.  Dr. Evaline from [facility] and this provider with reach out to JINNY Becker NP from [facility].  Medications at discharge. Susen denies suicidal or homicidal ideations.  Denies auditory visual hallucinations.  Denied alcohol cravings states she currently attends AA meetings for additional support.     Visit Diagnosis:    ICD-10-CM   1. Severe episode of recurrent major depressive disorder, without psychotic features (HCC)  F33.2     2. GAD (generalized anxiety disorder)  F41.1       Past Psychiatric History:   Past Medical History:  Past Medical History:  Diagnosis Date   Alcohol abuse    Anxiety    Benzodiazepine  withdrawal without complication (HCC) 02/12/2023   Depression    HTN (hypertension)    PTSD (post-traumatic stress disorder)     Past Surgical History:  Procedure Laterality Date   CYSTECTOMY Left    breast cyst removed many years ago    Family Psychiatric History:   Family History: No family history on file.  Social History:  Social History   Socioeconomic History   Marital status: Divorced    Spouse name: Not on file   Number of children: 1   Years of education: Not on file   Highest education level: Bachelor's degree (e.g., BA, AB, BS)  Occupational History   Not on file  Tobacco Use   Smoking status: Never   Smokeless tobacco: Never  Vaping Use   Vaping status: Never Used  Substance and Sexual Activity   Alcohol use: Not Currently    Comment: sober since March 7th 2025   Drug use: Never   Sexual activity: Not Currently  Other Topics Concern   Not on file  Social History Narrative   Not on file   Social Drivers of Health   Financial Resource Strain: Low Risk  (10/19/2022)   Received from [facility]   Overall Financial Resource Strain (CARDIA)    Difficulty of Paying Living Expenses: Not hard at all  Food Insecurity: No Food Insecurity (02/12/2024)   Hunger Vital Sign    Worried About Running Out of Food in the Last Year: Never true    Ran Out of Food in the Last Year: Never true  Transportation Needs: No Transportation Needs (02/12/2024)   PRAPARE - Transportation  Lack of Transportation (Medical): No    Lack of Transportation (Non-Medical): No  Physical Activity: Insufficiently Active (10/19/2022)   Received from [facility]   Exercise Vital Sign    On average, how many days per week do you engage in moderate to strenuous exercise (like a brisk walk)?: 3 days    On average, how many minutes do you engage in exercise at this level?: 20 min  Stress: No Stress Concern Present (10/19/2022)   Received from [facility] - Occupational Stress Questionnaire    Feeling of Stress : Not at all  Social Connections: Moderately Isolated (02/12/2024)   Social Connection and Isolation Panel    Frequency of Communication with Friends and Family: Once a week    Frequency of Social Gatherings with Friends and Family: Once a week    Attends Religious Services: 1 to 4 times per year    Active Member of Clubs or Organizations: No    Attends Banker Meetings: 1 to 4 times per year    Marital Status: Divorced    Allergies: No Known Allergies  Metabolic Disorder Labs: Lab Results  Component Value Date   HGBA1C 5.2 02/18/2024   MPG 102.54 02/18/2024   MPG 93.93 05/25/2023   No results found for: PROLACTIN Lab Results  Component Value Date   CHOL 162 02/18/2024   TRIG 85 02/18/2024   HDL 49 02/18/2024   CHOLHDL 3.3 02/18/2024   VLDL 17 02/18/2024   LDLCALC 96 02/18/2024   LDLCALC 41 05/25/2023   Lab Results  Component Value Date   TSH 3.122 02/10/2024   TSH 1.830 05/25/2023    Therapeutic Level Labs: No results found for: LITHIUM No results found for: VALPROATE No results found for: CBMZ  Current Medications: Current Outpatient Medications  Medication Sig Dispense Refill   amLODipine  (NORVASC ) 10 MG tablet Take 1 tablet (10 mg total) by mouth daily. 30 tablet 0   B Complex Vitamins (B COMPLEX PO) Take 1 tablet by mouth daily.     buPROPion (WELLBUTRIN XL) 150 MG 24 hr tablet Take 150 mg by mouth every morning.     busPIRone  (BUSPAR ) 15 MG tablet Take 1 tablet (15 mg total) by mouth 3 (three) times daily. (Patient taking differently: Take 30 mg by mouth 2 (two) times daily.) 90 tablet 0   doxepin  (SINEQUAN ) 50 MG capsule Take 1 capsule (50 mg total) by mouth at bedtime. 30 capsule 0   ferrous sulfate 325 (65 FE) MG tablet Take 325 mg by mouth every morning.     mirtazapine  (REMERON  SOLTAB) 45 MG disintegrating tablet Take 1 tablet (45 mg total) by mouth at  bedtime. 90 tablet 0   Multiple Vitamins-Minerals (ADULT GUMMY PO) Take 2 tablets by mouth daily.     sertraline  (ZOLOFT ) 100 MG tablet Take 2 tablets (200 mg total) by mouth daily. 30 tablet 0   traZODone  (DESYREL ) 100 MG tablet Take 1 tablet (100 mg total) by mouth at bedtime. 30 tablet 0   ARIPiprazole  (ABILIFY ) 5 MG tablet Take 1 tablet (5 mg total) by mouth at bedtime. (Patient not taking: Reported on 09/19/2024) 30 tablet 0   gabapentin  (NEURONTIN ) 100 MG capsule Take 1 capsule (100 mg total) by mouth 2 (two) times daily. (Patient not taking: Reported on 09/19/2024) 60 capsule 0   gabapentin  (NEURONTIN ) 300 MG capsule Take 1 capsule (300 mg total) by mouth at bedtime. (Patient not taking: Reported on 09/19/2024) 30  capsule 0   hydrOXYzine  (ATARAX ) 25 MG tablet Take 1 tablet (25 mg total) by mouth every 6 (six) hours as needed for anxiety. (Patient not taking: Reported on 09/19/2024) 30 tablet 0   [START ON 10/07/2024] LORazepam  (ATIVAN ) 0.5 MG tablet Take 0.5-1 mg by mouth 4 (four) times daily as needed.     No current facility-administered medications for this visit.     Musculoskeletal: Strength & Muscle Tone: within normal limits Gait & Station: normal Patient leans: N/A  Psychiatric Specialty Exam: Review of Systems  There were no vitals taken for this visit.There is no height or weight on file to calculate BMI.  General Appearance: Disheveled  Eye Contact:  Good  Speech:  Clear and Coherent  Volume:  Normal  Mood:  Anxious  Affect:  Congruent  Thought Process:  Coherent  Orientation:  Full (Time, Place, and Person)  Thought Content: Logical   Suicidal Thoughts:  No  Homicidal Thoughts:  No  Memory:  Immediate;   Good Recent;   Good  Judgement:  Good  Insight:  Good  Psychomotor Activity:  Normal  Concentration:  Concentration: Good  Recall:  Good  Fund of Knowledge: Good  Language: Good  Akathisia:  No  Handed:  Right  AIMS (if indicated): not done  Assets:   Communication Skills Desire for Improvement  ADL's:  Intact  Cognition: WNL  Sleep:  Poor   Screenings: AUDIT    Flowsheet Row Admission (Discharged) from 02/12/2024 in [facility] BEH[facility]IORAL [facility]INE ED from 05/25/2023 in [facility] Emergency Department at [facility]  Alcohol Use Disorder Identification Test Final Score (AUDIT) 9 27   GAD-7    Flowsheet Row Counselor from 09/25/2024 in [facility] Counselor from 09/19/2024 in [facility]  Total GAD-7 Score 15 16   PHQ2-9    Flowsheet Row Counselor from 09/25/2024 in [facility] Counselor from 09/19/2024 in [facility] Counselor from 09/04/2024 in [facility] ED from 02/10/2024 in [facility] ED from 05/25/2023 in [facility]  PHQ-2 Total Score 6 6 6 6 6   PHQ-9 Total Score 15 15 17 24 9    Flowsheet Row Admission (Discharged) from 02/12/2024 in [facility] BEHAVIORAL [facility] ED from 02/10/2024 in [facility] ED to Hosp-Admission (Discharged) from 09/03/2023 in [facility] 4TH FLOOR PROGRESSIVE CARE AND UROLOGY  C-SSRS RISK CATEGORY No Risk No Risk No Risk     Assessment and Plan:  Continue Partial Hospitalization Programming  - Continued medication as directed - Awaiting follow-up [facility] for medication clarification - Contacted Sharlot Becker, updated medications at the time of discharge from [facility]-awaiting follow-up  Collaboration of Care: Collaboration of Care: Other Continue AA for additional support  Patient/Guardian was advised Release of Information must be obtained prior to any record release in order to collaborate their care with an outside provider. Patient/Guardian was advised if they have not already done so to contact the registration department to sign all necessary forms  in order for us  to release information regarding their care.   Consent: Patient/Guardian gives verbal consent for treatment and assignment of benefits for services provided during this visit. Patient/Guardian expressed understanding and agreed to proceed.    Staci LOISE Kerns, NP 10/04/2024, 2:53 PM

## 2024-10-05 ENCOUNTER — Ambulatory Visit (HOSPITAL_COMMUNITY)

## 2024-10-05 ENCOUNTER — Ambulatory Visit (INDEPENDENT_AMBULATORY_CARE_PROVIDER_SITE_OTHER): Admitting: Licensed Clinical Social Worker

## 2024-10-05 ENCOUNTER — Encounter (HOSPITAL_COMMUNITY): Payer: Self-pay

## 2024-10-05 DIAGNOSIS — F332 Major depressive disorder, recurrent severe without psychotic features: Secondary | ICD-10-CM | POA: Diagnosis not present

## 2024-10-05 DIAGNOSIS — R4589 Other symptoms and signs involving emotional state: Secondary | ICD-10-CM

## 2024-10-05 DIAGNOSIS — F411 Generalized anxiety disorder: Secondary | ICD-10-CM

## 2024-10-05 NOTE — Therapy (Signed)
 [facility] [address] [place], [place], [address] Phone: [phone]   Fax:  [phone]  Occupational Therapy Treatment  Patient Details  Name: Stephanie Riley MRN: [id number] Date of Birth: 1951-02-16 No data recorded  Encounter Date: 10/03/2024   OT End of Session - 10/05/24 2128     Visit Number 3    Number of Visits 15    Date for Recertification  10/09/24    OT Start Time 1230    OT Stop Time 1330    OT Time Calculation (min) 60 min          Past Medical History:  Diagnosis Date   Alcohol abuse    Anxiety    Benzodiazepine withdrawal without complication (HCC) 02/12/2023   Depression    HTN (hypertension)    PTSD (post-traumatic stress disorder)     Past Surgical History:  Procedure Laterality Date   CYSTECTOMY Left    breast cyst removed many years ago    There were no vitals filed for this visit.   Subjective Assessment - 10/05/24 2128     Currently in Pain? No/denies    Pain Score 0-No pain             Group Session:  S: Feeling better today  O: During the group therapy session, the occupational therapist discussed the impact of sleep disturbances on daily activities and overall health and wellbeing.   The OT also reviewed various types of sleep disorders, including insomnia, sleep apnea, restless leg syndrome, and narcolepsy, and their associated symptoms. Strategies for managing and treating sleep disturbances were also discussed, such as establishing a consistent sleep routine, avoiding stimulants before bedtime, and engaging in relaxation techniques.   Today's group also included information on how sleep disturbances can cause fatigue, mood changes, cognitive impairment, and physical health problems, and emphasizes the importance of seeking prompt treatment to maintain overall health and wellbeing.   A: In today's session, the patient demonstrated active engagement with the topic of The Importance  of Sleep. They eagerly asked questions, contributed personal experiences, and showcased a noticeable eagerness to apply the discussed principles. Their participation indicated not only a strong understanding of the subject matter but also an intrinsic motivation to implement better sleep practices in their daily routine. Based on their proactive involvement, it is assessed that the patient greatly benefited from today's treatment and will likely make efforts to incorporate the insights gained.  P: Continue to attend PHP OT group sessions 5x week for 4 weeks to promote daily structure, social engagement, and opportunities to develop and utilize adaptive strategies to maximize functional performance in preparation for safe transition and integration back into school, work, and the community. Plan to address topic of cont'd in next OT group session.                   OT Education - 10/05/24 2128     Education Details Sleep           OT Short Term Goals - 10/01/24 1251       OT SHORT TERM GOAL #1   Title Patient will identify and demonstrate at least two grounding or coping strategies to manage emotional distress, reporting decreased intensity of negative thoughts or anxiety during group participation and daily activities.    Time 4    Period Weeks    Status On-going    Target Date 10/09/24      OT SHORT TERM GOAL #2  Title atient will develop and implement a structured daily routine that incorporates at least one productive, one self-care, and one leisure activity to improve consistency, balance, and sense of purpose throughout the week.    Status On-going      OT SHORT TERM GOAL #3   Title Patient will identify one personal goal related to daily functioning or wellness and apply the SMART goal framework to create a clear, measurable, and achievable plan to improve engagement in meaningful roles.    Status On-going                   Plan - 10/05/24 2129      Psychosocial Skills Interpersonal Interaction;Habits;Environmental  Adaptations;Routines and Behaviors;Coping Strategies          Patient will benefit from skilled therapeutic intervention in order to improve the following deficits and impairments:       Psychosocial Skills: Interpersonal Interaction, Habits, Environmental  Adaptations, Routines and Behaviors, Coping Strategies   Visit Diagnosis: Difficulty coping    Problem List Patient Active Problem List   Diagnosis Date Noted   Anxiety disorder 02/21/2024   MDD (major depressive disorder), recurrent episode 02/12/2024   Alcohol use disorder, severe, in early remission (HCC) 10/22/2023   GAD (generalized anxiety disorder) 10/22/2023   Severe episode of recurrent major depressive disorder, without psychotic features (HCC) 10/22/2023   Hypophosphatemia 09/05/2023   Alcoholic ketoacidosis 09/04/2023   Lactic acidosis 09/04/2023   SIRS (systemic inflammatory response syndrome) (HCC) 09/04/2023   Transaminitis 09/04/2023   Elevated CK 09/04/2023   Hypokalemia 09/04/2023   Hypocalcemia 09/04/2023   COVID-19 virus infection 09/04/2023   Essential hypertension 09/04/2023   Aspiration into airway 09/04/2023   Alcohol use disorder 03/19/2023   Alcohol use disorder, moderate, dependence (HCC) 02/12/2023    [name], OT 10/05/2024, 9:29 PM  [name], OT   [facility] [address] [place], [place], [address] Phone: [phone]   Fax:  [phone]  Name: Stephanie Riley MRN: [id number] Date of Birth: 1951/08/01

## 2024-10-06 ENCOUNTER — Ambulatory Visit (HOSPITAL_COMMUNITY)

## 2024-10-06 NOTE — Psych (Signed)
 [facility] BH PHP THERAPIST PROGRESS NOTE  Stephanie Riley [id number]   Session Time: 9:00 am - 10:00 am  Participation Level: Active  Behavioral Response: CasualAlertAnxious and Depressed  Type of Therapy: Group Therapy  Treatment Goals addressed: Coping  Progress Towards Goals: Initial  Interventions: CBT, DBT, Solution Focused, Strength-based, Supportive, and Reframing  Therapist Response: Clinician led check-in regarding current stressors and situation, and review of patient completed daily inventory. Clinician utilized active listening and empathetic response and validated patient emotions. Clinician facilitated processing group on pertinent issues.?   Summary: Patient arrived within time allowed. Patient rates their depression at a 7 and anxiety at a 7 on a scale of 1-10 with 10 being high. Pt reports they slept 7 hours last night and ate 3x yesterday. Pt reports continued rumination and anxiety re: the future and where she will live and what the status of her marriage is. Pt struggles to utilize skills when she is ruminating to distract or thought stop. Pt shares feeling confused recently because nice husband is out and that makes pt think they can get back together. Pt acknowledges she never knows which version of him she will get and that it is a negative pattern. Pt denied experiencing SI/SH thoughts. Pt able to process.?Pt engaged in discussion.?       Session Time: 10:00 am - 11:00 am  Participation Level: Active  Behavioral Response: CasualAlertAnxious and Depressed  Type of Therapy: Group Therapy  Treatment Goals addressed: Coping  Progress Towards Goals: Initial  Interventions: CBT, DBT, Solution Focused, Strength-based, Supportive, and Reframing  Therapist Response: Cln led discussion on time as a piece in recovery. Cln held space for group members' frustrations that they are not better yet, that they are putting in the work but results are not quick, that they  feel they will never get better. Cln provided encouragment to group that they are not doing anything wrong or un-helpable, but that it takes time for treatment to work, for habits to build, and for medicine to reach efficacy. Cln utilized air cabin crew of a broken bone and that even following doctor's orders, time has to pass for the bone to heal.    Summary: Pt engaged in discussion and reports understanding.           Session Time: 11:00 am - 12:00 pm   Participation Level: Active   Behavioral Response: CasualAlertAnxious   Type of Therapy: Group Therapy   Treatment Goals addressed: Coping   Progress Towards Goals: Progressing   Interventions: CBT, DBT, Solution Focused, Strength-based, Supportive, and Reframing   Therapist Response: Cln led discussion on why and what now in terms of how we focus on our problems. Group members shared how focus on why has impacted them and barriers to focusing on what now. Group able to process. Cln encouraged pt's to consider what why accomplishes.    Summary:  Pt engaged in discussion and reports they struggle with focusing on why.             Session Time: 12:00 pm - 12:30 pm   Participation Level: Active   Behavioral Response: CasualAlertAnxious   Type of Therapy: Group Therapy   Treatment Goals addressed: Coping   Progress Towards Goals: Progressing   Interventions: Psychologist, Occupational, Supportive   Therapist Response: Reflection Group: Patients encouraged to practice skills and interpersonal techniques or work on mindfulness and relaxation techniques. The importance of self-care and making skills part of a routine to increase usage were stressed.  Summary: Patient engaged and participated appropriately.           Session Time: 12:30 pm - 1:30 pm   Participation Level: Active   Behavioral Response: CasualAlertAnxious   Type of Therapy: Group Therapy   Treatment Goals addressed: Coping   Progress  Towards Goals: Progressing   Interventions: OT group   Therapist Response: Group was led by occupational therapist, Edward Hollan.    Summary: Pt engaged and participated in discussion.           Session Time: 1:30 pm - 2:00 pm   Participation Level: Active   Behavioral Response: CasualAlertAnxious   Type of Therapy: Group Therapy   Treatment Goals addressed: Coping   Progress Towards Goals: Progressing   Interventions: CBT, DBT, Solution Focused, Strength-based, Supportive, and Reframing   Therapist Response: 1:30 pm - 1:50 pm: Cln introduced the Sensations distraction skills from DBT distress tolerance skill ACCEPTS. Cln discussed how this set of distraction skills can be helpful in situations where you feel overwhelmed and need to stabilize quickly.  1:50 - 2:00 pm: Clinician led check-out. Clinician assessed for immediate needs, medication compliance and efficacy, and safety concerns?   Summary: 1:30 pm - 1:50 pm:  Pt engaged in discussion and identifies how they can practice this skill.   1:50 - 2:00 At check-out, patient contracts for safety.?Patient demonstrates progress as evidenced by continued engagement and by being receptive to treatment. Patient denies SI/HI/self-harm thoughts at the end of group and agrees to seek help should those thoughts/feelings occur.?    Suicidal/Homicidal: Nowithout intent/plan  Plan: ?Pt will continue in PHP and medication management while continuing to work on decreasing depression symptoms,?SI, and anxiety symptoms,?and increasing the ability to self manage symptoms.     Collaboration of Care: Medication Management AEB Staci Kerns, NP  Patient/Guardian was advised Release of Information must be obtained prior to any record release in order to collaborate their care with an outside provider. Patient/Guardian was advised if they have not already done so to contact the registration department to sign all necessary forms in order for us   to release information regarding their care.   Consent: Patient/Guardian gives verbal consent for treatment and assignment of benefits for services provided during this visit. Patient/Guardian expressed understanding and agreed to proceed.   Diagnosis: Severe episode of recurrent major depressive disorder, without psychotic features (HCC) [F33.2]    1. Severe episode of recurrent major depressive disorder, without psychotic features (HCC)   2. GAD (generalized anxiety disorder)       Randall Bastos, LCSW

## 2024-10-06 NOTE — Psych (Signed)
 [facility] PHP THERAPIST PROGRESS NOTE  Jayde Mcallister Febus [id number]   Session Time: 9:00 am - 10:00 am  Participation Level: Active  Behavioral Response: CasualAlertAnxious and Depressed  Type of Therapy: Group Therapy  Treatment Goals addressed: Coping  Progress Towards Goals: Initial  Interventions: CBT, DBT, Solution Focused, Strength-based, Supportive, and Reframing  Therapist Response: Clinician led check-in regarding current stressors and situation, and review of patient completed daily inventory. Clinician utilized active listening and empathetic response and validated patient emotions. Clinician facilitated processing group on pertinent issues.?   Summary: Patient arrived within time allowed. Patient rates their depression at a 9 and anxiety at a 9 on a scale of 1-10 with 10 being high. Pt reports they slept 8 hours last night and ate 3x yesterday. Pt rpeorts continued rumination about her husband and the past 2 years when things devolved. Pt struggles with assurance seeking and anxious behavior of wanting to control who she is seen by others. Pt reports benefit from support given by group. Pt is tearful Pt denied experiencing SI/SH thoughts. Pt able to process.?Pt engaged in discussion.?       Session Time: 10:00 am - 11:00 am  Participation Level: Active  Behavioral Response: CasualAlertAnxious and Depressed  Type of Therapy: Group Therapy  Treatment Goals addressed: Coping  Progress Towards Goals: Initial  Interventions: CBT, DBT, Solution Focused, Strength-based, Supportive, and Reframing  Therapist Response: Cln led discussion on family dynamics and the way in which they impact us . Group members shared struggles with their families and the way the patterns of behaviors have negatively impacted them. Cln provided space to process and validated pt's experiences.   Summary: Pt engaged in discussion and is able to identify current struggles in their family dynamics.  Pt able to process.      Session Time: 11:00 am - 12:00 pm  Participation Level: Active  Behavioral Response: CasualAlertAnxious  Type of Therapy: Group Therapy  Treatment Goals addressed: Coping  Progress Towards Goals: Progressing  Interventions: CBT, DBT, Solution Focused, Strength-based, Supportive, and Reframing  Therapist Response: Cln introduced DBT distress tolerance distraction skills. Cln provides context for distraction skills and why distraction is a foundational way to manage mood dysregulation. Group discussed how to apply distraction.   Summary: Pt engaged in discussion and reports understanding of how distraction can be applied to manage big feelings.      Session Time: 12:00 pm - 1:00 pm  Participation Level: Active  Behavioral Response: CasualAlertAnxious  Type of Therapy: Group Therapy  Treatment Goals addressed: Coping  Progress Towards Goals: Progressing  Interventions: OT group  Therapist Response: 12:00 - 12:50 Group was led by occupational therapist, [name].  12:50 - 1:00 pm: Clinician led check-out. Clinician assessed for immediate needs, medication compliance and efficacy, and safety concerns?  Summary: 12:00 - 12:50 Pt engaged and participated in discussion. 12:50 - 1:00 At check-out, patient contracts for safety.?Patient demonstrates progress as evidenced by her engagement and by being receptive to treatment. Patient denies SI/HI/self-harm thoughts at the end of group and agrees to seek help should those thoughts/feelings occur.?     Suicidal/Homicidal: Nowithout intent/plan  Plan: ?Pt will continue in PHP and medication management while continuing to work on decreasing depression symptoms,?SI, and anxiety symptoms,?and increasing the ability to self manage symptoms.     Collaboration of Care: Medication Management AEB Staci Kerns, NP  Patient/Guardian was advised Release of Information must be obtained prior to any record  release in order to collaborate their care with  an outside provider. Patient/Guardian was advised if they have not already done so to contact the registration department to sign all necessary forms in order for us  to release information regarding their care.   Consent: Patient/Guardian gives verbal consent for treatment and assignment of benefits for services provided during this visit. Patient/Guardian expressed understanding and agreed to proceed.   Diagnosis: Severe episode of recurrent major depressive disorder, without psychotic features (HCC) [F33.2]    1. Severe episode of recurrent major depressive disorder, without psychotic features (HCC)   2. GAD (generalized anxiety disorder)       Randall Bastos, LCSW

## 2024-10-06 NOTE — Psych (Signed)
 [facility] PHP THERAPIST PROGRESS NOTE  Stephanie Riley [id number]   Session Time: 9:00 am - 10:00 am  Participation Level: Active  Behavioral Response: CasualAlertAnxious and Depressed  Type of Therapy: Group Therapy  Treatment Goals addressed: Coping  Progress Towards Goals: Initial  Interventions: CBT, DBT, Solution Focused, Strength-based, Supportive, and Reframing  Therapist Response: Clinician led check-in regarding current stressors and situation, and review of patient completed daily inventory. Clinician utilized active listening and empathetic response and validated patient emotions. Clinician facilitated processing group on pertinent issues.?   Summary: Patient arrived within time allowed. Patient rates their depression at a 9 and anxiety at a 9 on a scale of 1-10 with 10 being high. Pt reports they slept 6 hours last night and ate 2 meals yesterday. Pt states she woke up with hardly any anxiety and this is the first time in a really long time. Pt states the anxiety has filtered back in the longer she is awake. Pt reports feeling overwhelmed with changes such as losing support people, needing to move, and trying to make sense of her symptoms. Pt is scattered and slightly tangential. Pt reports struggle with a friend who is very negative. Pt denied experiencing SI/SH thoughts. Pt able to process.?Pt engaged in discussion.?       Session Time: 10:00 am - 11:00 am  Participation Level: Active  Behavioral Response: CasualAlertAnxious and Depressed  Type of Therapy: Group Therapy  Treatment Goals addressed: Coping  Progress Towards Goals: Initial  Interventions: CBT, DBT, Solution Focused, Strength-based, Supportive, and Reframing  Therapist Response: Cln led processing group for pt's current struggles. Group members shared stressors and provided support and feedback. Cln brought in topics of boundaries, healthy relationships, and unhealthy thought processes to inform  discussion.   Summary: Pt able to process and provide support to group.       Session Time: 11:00 am - 12:00 pm  Participation Level: Active  Behavioral Response: CasualAlertAnxious  Type of Therapy: Group Therapy  Treatment Goals addressed: Coping  Progress Towards Goals: Progressing  Interventions: CBT, DBT, Solution Focused, Strength-based, Supportive, and Reframing  Therapist Response: Cln led discussion on extending grace and kindness to ourselves. Group discussed the messages they give themselves and how it impacts them. Cln discussed the best friend test as a way to calibrate whether we are being fair to ourselves or not and encouraged pt's to consider, would I believe this/say this about someone I cared about?    Summary:  Pt engaged in discussion and identifies that treating themselves with kindness is difficult.       Session Time: 12:00 pm - 12:30 pm  Participation Level: Active  Behavioral Response: CasualAlertAnxious  Type of Therapy: Group Therapy  Treatment Goals addressed: Coping  Progress Towards Goals: Progressing  Interventions: Psychologist, Occupational, Supportive  Therapist Response: Reflection Group: Patients encouraged to practice skills and interpersonal techniques or work on mindfulness and relaxation techniques. The importance of self-care and making skills part of a routine to increase usage were stressed.  Summary: Patient engaged and participated appropriately.      Session Time: 12:30 pm - 1:30 pm  Participation Level: Active  Behavioral Response: CasualAlertAnxious  Type of Therapy: Group Therapy  Treatment Goals addressed: Coping  Progress Towards Goals: Progressing  Interventions: OT group  Therapist Response: Group was led by occupational therapist, Edward Hollan.   Summary: Pt engaged and participated in discussion.      Session Time: 1:30 pm - 2:00 pm  Participation Level:  Active  Behavioral Response:  CasualAlertAnxious  Type of Therapy: Group Therapy  Treatment Goals addressed: Coping  Progress Towards Goals: Progressing  Interventions: CBT, DBT, Solution Focused, Strength-based, Supportive, and Reframing  Therapist Response: 1:30 pm - 1:50 pm: Cln introduced Progressive Muscle Relaxation from the DBT TIPP skills and led group through practice of the skill.  1:50 - 2:00 pm: Clinician led check-out. Clinician assessed for immediate needs, medication compliance and efficacy, and safety concerns?  Summary: 1:30 pm - 1:50 pm: Pt participated and engaged in activity.  1:50 pm - 2:00 pm: At check-out, patient contracts for safety.?Patient demonstrates progress as evidenced by her engagement and by being receptive to treatment. Patient denies SI/HI/self-harm thoughts at the end of group and agrees to seek help should those thoughts/feelings occur.?    Suicidal/Homicidal: Nowithout intent/plan  Plan: ?Pt will continue in PHP and medication management while continuing to work on decreasing depression symptoms,?SI, and anxiety symptoms,?and increasing the ability to self manage symptoms.     Collaboration of Care: Medication Management AEB Staci Kerns, NP  Patient/Guardian was advised Release of Information must be obtained prior to any record release in order to collaborate their care with an outside provider. Patient/Guardian was advised if they have not already done so to contact the registration department to sign all necessary forms in order for us  to release information regarding their care.   Consent: Patient/Guardian gives verbal consent for treatment and assignment of benefits for services provided during this visit. Patient/Guardian expressed understanding and agreed to proceed.   Diagnosis: Severe episode of recurrent major depressive disorder, without psychotic features (HCC) [F33.2]    1. Severe episode of recurrent major depressive disorder, without psychotic features (HCC)    2. GAD (generalized anxiety disorder)       Randall Bastos, LCSW

## 2024-10-06 NOTE — Psych (Signed)
 [facility] THERAPIST PROGRESS NOTE  Stephanie Riley [id number]   Session Time: 9:00 am - 10:00 am  Participation Level: Active  Behavioral Response: CasualAlertAnxious and Depressed  Type of Therapy: Group Therapy  Treatment Goals addressed: Coping  Progress Towards Goals: Initial  Interventions: CBT, DBT, Solution Focused, Strength-based, Supportive, and Reframing  Therapist Response: Clinician led check-in regarding current stressors and situation, and review of patient completed daily inventory. Clinician utilized active listening and empathetic response and validated patient emotions. Clinician facilitated processing group on pertinent issues.?   Summary: Patient arrived within time allowed. Patient rates their depression at a 9 and anxiety at a 9 on a scale of 1-10 with 10 being high. Pt reports they slept 8 hours last night and ate 3x yesterday. Pt shares her negative friend communicated with her again yesterday and pt is struggling with the friendship dynamic. Pt struggles with guilt, shame, and shoulds. Pt states she is having racing and circular thoughts. Pt is tearful Pt denied experiencing SI/SH thoughts. Pt able to process.?Pt engaged in discussion.?       Session Time: 10:00 am - 11:00 am  Participation Level: Active  Behavioral Response: CasualAlertAnxious and Depressed  Type of Therapy: Group Therapy  Treatment Goals addressed: Coping  Progress Towards Goals: Initial  Interventions: CBT, DBT, Solution Focused, Strength-based, Supportive, and Reframing  Therapist Response: Cln led processing group for pt's current struggles. Group members shared stressors and provided support and feedback. Cln brought in topics of boundaries, healthy relationships, and unhealthy thought processes to inform discussion.   Summary: Pt able to process and provide support to group.       Session Time: 11:00 am - 12:00 pm  Participation Level: Active  Behavioral  Response: CasualAlertAnxious  Type of Therapy: Group Therapy  Treatment Goals addressed: Coping  Progress Towards Goals: Progressing  Interventions: CBT, DBT, Solution Focused, Strength-based, Supportive, and Reframing  Therapist Response: Cln led discussion on the stigma of mental health and the way it has impacted pt's experience of their own mental health. Group members shared struggles such as lack of acceptance, increase in isolation, being scared of backlash when disclosing MH, and impact in support. Cln created space for pt's to process and share. Cln highlighted support groups as a safe space.   Summary: Pt engaged in discussion and shared ways stigma has impacted them.        Session Time: 12:00 pm - 12:30 pm  Participation Level: Active  Behavioral Response: CasualAlertAnxious  Type of Therapy: Group Therapy  Treatment Goals addressed: Coping  Progress Towards Goals: Progressing  Interventions: Psychologist, Occupational, Supportive  Therapist Response: Reflection Group: Patients encouraged to practice skills and interpersonal techniques or work on mindfulness and relaxation techniques. The importance of self-care and making skills part of a routine to increase usage were stressed.  Summary: Patient engaged and participated appropriately.      Session Time: 12:30 pm - 1:30 pm  Participation Level: Active  Behavioral Response: CasualAlertAnxious  Type of Therapy: Group Therapy  Treatment Goals addressed: Coping  Progress Towards Goals: Progressing  Interventions: CBT, DBT, Solution Focused, Strength-based, Supportive, and Reframing  Therapist Response: Cln led discussion on HALT (hungry, angry, lonely, tired) acronym, which encourages check-in with ourselves when feeling emotional. Cln encouraged pt's to use HALT as a first check-in step to address basic vulnerabilities before reacting. Group members discussed ways in which they react to being angry,  lonely, and tired, and work to determine times in which utilizing HALT  would have diverted a bigger issue.   Summary: Pt engaged in discussion and reports understanding of HALT and willingness to utilize it.      Session Time: 1:30 pm - 1:45 pm  Participation Level: Active  Behavioral Response: CasualAlertAnxious  Type of Therapy: Group Therapy  Treatment Goals addressed: Coping  Progress Towards Goals: Progressing  Interventions: CBT, DBT, Solution Focused, Strength-based, Supportive, and Reframing  Therapist Response: 1:30 pm - 1:45pm Clinician led check-out. Clinician assessed for immediate needs, medication compliance and efficacy, and safety concerns?  Summary: 1:30 pm - 1:45 pm: At check-out, patient contracts for safety.?Patient demonstrates progress as evidenced by her engagement and by being receptive to treatment. Patient denies SI/HI/self-harm thoughts at the end of group and agrees to seek help should those thoughts/feelings occur.?    Suicidal/Homicidal: Nowithout intent/plan  Plan: ?Pt will continue in PHP and medication management while continuing to work on decreasing depression symptoms,?SI, and anxiety symptoms,?and increasing the ability to self manage symptoms.     Collaboration of Care: Medication Management AEB Staci Kerns, NP  Patient/Guardian was advised Release of Information must be obtained prior to any record release in order to collaborate their care with an outside provider. Patient/Guardian was advised if they have not already done so to contact the registration department to sign all necessary forms in order for us  to release information regarding their care.   Consent: Patient/Guardian gives verbal consent for treatment and assignment of benefits for services provided during this visit. Patient/Guardian expressed understanding and agreed to proceed.   Diagnosis: Severe episode of recurrent major depressive disorder, without psychotic features  (HCC) [F33.2]    1. Severe episode of recurrent major depressive disorder, without psychotic features (HCC)   2. GAD (generalized anxiety disorder)       Randall Bastos, LCSW

## 2024-10-06 NOTE — Psych (Signed)
 [facility] PHP THERAPIST PROGRESS NOTE  Stephanie Riley [id number]   Session Time: 9:00 am - 10:00 am  Participation Level: Active  Behavioral Response: CasualAlertAnxious and Depressed  Type of Therapy: Group Therapy  Treatment Goals addressed: Coping  Progress Towards Goals: Progressing  Interventions: CBT, DBT, Solution Focused, Strength-based, Supportive, and Reframing  Therapist Response: Clinician led check-in regarding current stressors and situation, and review of patient completed daily inventory. Clinician utilized active listening and empathetic response and validated patient emotions. Clinician facilitated processing group on pertinent issues.?   Summary: Patient arrived within time allowed. Patient rates their depression at a 6 and anxiety at a 5 on a scale of 1-10 with 10 being high. Pt reports they slept 8 hours last night and ate 3x yesterday. Pt reports she  felt angry yesterday when someone did not treat her fairly and pt was happy. Pt reports she feels it is a step towards becoming the old me to be able to feel her feelings. Pt continues to struggle with rumination/fixation on how people view her. Pt able to process.?Pt engaged in discussion.?       Session Time: 10:00 am - 11:00 am  Participation Level: Active  Behavioral Response: CasualAlertAnxious and Depressed  Type of Therapy: Group Therapy  Treatment Goals addressed: Coping  Progress Towards Goals: Progressing  Interventions: CBT, DBT, Solution Focused, Strength-based, Supportive, and Reframing  Therapist Response: Cln led discussion on DBT dialectics and balance. Cln encouraged pt's to utilize AND statements to cue their brain to hold two opposing ideas. Cln provided examples and group members created their own AND statements.   Summary: Pt engaged in discussion and created AND statement around recognzing progress.            Session Time: 11:00 am - 12:00 pm   Participation Level:  Active   Behavioral Response: CasualAlertAnxious   Type of Therapy: Group Therapy   Treatment Goals addressed: Coping   Progress Towards Goals: Progressing   Interventions: CBT, DBT, Solution Focused, Strength-based, Supportive, and Reframing   Therapist Response: Cln led discussion on negative self-talk and how it affects us . Cln utilized CBT to discuss how thoughts shape our feelings and actions. Group members shared how negative thinking affects them and worked to reframe their negative thinking.     Summary:  Pt engaged in discussion and reports understanding.              Session Time: 12:00 pm - 12:30 pm   Participation Level: Active   Behavioral Response: CasualAlertAnxious   Type of Therapy: Group Therapy   Treatment Goals addressed: Coping   Progress Towards Goals: Progressing   Interventions: Psychologist, Occupational, Supportive   Therapist Response: Reflection Group: Patients encouraged to practice skills and interpersonal techniques or work on mindfulness and relaxation techniques. The importance of self-care and making skills part of a routine to increase usage were stressed.   Summary: Patient engaged and participated appropriately.           Session Time: 12:30 pm - 1:30 pm   Participation Level: Active   Behavioral Response: CasualAlertAnxious   Type of Therapy: Group Therapy   Treatment Goals addressed: Coping   Progress Towards Goals: Progressing   Interventions: OT group   Therapist Response: Group was led by occupational therapist, Edward Hollan.    Summary: Pt engaged and participated in discussion.           Session Time: 1:30 pm - 2:00 pm   Participation Level: Active  Behavioral Response: CasualAlertAnxious   Type of Therapy: Group Therapy   Treatment Goals addressed: Coping   Progress Towards Goals: Progressing   Interventions: CBT, DBT, Solution Focused, Strength-based, Supportive, and Reframing   Therapist  Response: 1:30 pm - 1:50 pm: Cln led group through guided streatching with deep breathing as a way to increase relaxation and decrease stress and tension being held in the body. Cln discussed the importance of harnessing the body-mind connection informed by DBT TIPP skills.  1:50 - 2:00 pm: Clinician led check-out. Clinician assessed for immediate needs, medication compliance and efficacy, and safety concerns?   Summary: 1:30 pm - 1:50 pm:  Pt engaged in discussion and practice.  12:50 - 1:00 At check-out, patient contracts for safety.?Patient demonstrates progress as evidenced by her engagement and by being receptive to treatment. Patient denies SI/HI/self-harm thoughts at the end of group and agrees to seek help should those thoughts/feelings occur.?    Suicidal/Homicidal: Nowithout intent/plan  Plan: ?Pt will continue in PHP and medication management while continuing to work on decreasing depression symptoms,?SI, and anxiety symptoms,?and increasing the ability to self manage symptoms.     Collaboration of Care: Medication Management AEB Staci Kerns, NP  Patient/Guardian was advised Release of Information must be obtained prior to any record release in order to collaborate their care with an outside provider. Patient/Guardian was advised if they have not already done so to contact the registration department to sign all necessary forms in order for us  to release information regarding their care.   Consent: Patient/Guardian gives verbal consent for treatment and assignment of benefits for services provided during this visit. Patient/Guardian expressed understanding and agreed to proceed.   Diagnosis: Severe episode of recurrent major depressive disorder, without psychotic features (HCC) [F33.2]    1. Severe episode of recurrent major depressive disorder, without psychotic features (HCC)   2. GAD (generalized anxiety disorder)       Randall Bastos, LCSW

## 2024-10-06 NOTE — Psych (Signed)
 [facility] BH PHP THERAPIST PROGRESS NOTE  Emiline Mancebo Jowett [id number]   Session Time: 9:00 am - 10:00 am  Participation Level: Active  Behavioral Response: CasualAlertAnxious and Depressed  Type of Therapy: Group Therapy  Treatment Goals addressed: Coping  Progress Towards Goals: Progressing  Interventions: CBT, DBT, Solution Focused, Strength-based, Supportive, and Reframing  Therapist Response: Clinician led check-in regarding current stressors and situation, and review of patient completed daily inventory. Clinician utilized active listening and empathetic response and validated patient emotions. Clinician facilitated processing group on pertinent issues.?   Summary: Patient arrived within time allowed. Patient rates their depression at a 9 and anxiety at a 9 on a scale of 1-10 with 10 being high. Pt reports they slept 6 hours last night and ate 3x yesterday. Pt reports continued fixation on being the old cindy. Pt shares people including her husband, daughter, and neighbor need to understand the way trauma has affected pt so that they can understand behaviors she had exhibited over the past 2 years. Pt accepts information on control and reassurance seeking, and struggles to grasp it. Pt able to process.?Pt engaged in discussion.?       Session Time: 10:00 am - 11:00 am  Participation Level: Active  Behavioral Response: CasualAlertAnxious and Depressed  Type of Therapy: Group Therapy  Treatment Goals addressed: Coping  Progress Towards Goals: Progressing  Interventions: CBT, DBT, Solution Focused, Strength-based, Supportive, and Reframing  Therapist Response: Cln led discussion on healthy aggression substitutes. Cln discussed the benefits to discharging energy and adrenaline when feeling revved up in emotion and the importance of balancing that discharge with safety and lack of negative consequences. Group brainstormed different ways to channel aggression in a healthy way  and shared ways that have worked for them in the past.   Summary: Pt engaged in discussion and identifies 3 options to try.          Session Time: 11:00 am - 12:00 pm   Participation Level: Active   Behavioral Response: CasualAlertAnxious   Type of Therapy: Group Therapy   Treatment Goals addressed: Coping   Progress Towards Goals: Progressing   Interventions: CBT, DBT, Solution Focused, Strength-based, Supportive, and Reframing   Therapist Response: Cln introduced CBT and the way in which it can provide context for addressing stumbling blocks. Group discussed the problem is not the problem, the problem is how we're thinking about the problem and tried to change perspective on current struggles.    Summary:  Pt engaged in discussion and is able to attempt reframing using CBT.              Session Time: 12:00 pm - 12:30 pm   Participation Level: Active   Behavioral Response: CasualAlertAnxious   Type of Therapy: Group Therapy   Treatment Goals addressed: Coping   Progress Towards Goals: Progressing   Interventions: Psychologist, Occupational, Supportive   Therapist Response: Reflection Group: Patients encouraged to practice skills and interpersonal techniques or work on mindfulness and relaxation techniques. The importance of self-care and making skills part of a routine to increase usage were stressed.   Summary: Patient engaged and participated appropriately.           Session Time: 12:30 pm - 1:30 pm   Participation Level: Active   Behavioral Response: CasualAlertAnxious   Type of Therapy: Group Therapy   Treatment Goals addressed: Coping   Progress Towards Goals: Progressing   Interventions: OT group   Therapist Response: Group was led by occupational therapist, Edward Hollan.  Summary: Pt engaged and participated in discussion.           Session Time: 1:30 pm - 1:45 pm   Participation Level: Active   Behavioral Response:  CasualAlertAnxious   Type of Therapy: Group Therapy   Treatment Goals addressed: Coping   Progress Towards Goals: Progressing   Interventions: CBT, DBT, Solution Focused, Strength-based, Supportive, and Reframing   Therapist Response: 1:30 pm - 1:45 pm: Clinician led check-out. Clinician assessed for immediate needs, medication compliance and efficacy, and safety concerns?   Summary: 1:30 pm - 1:45 pm: At check-out, patient reports no immediate concerns. Patient demonstrates progress as evidenced by engagement and responsiveness to treatment. Patient denies SI/HI/self-harm thoughts at the end of group.   Suicidal/Homicidal: Nowithout intent/plan  Plan: ?Pt will continue in PHP and medication management while continuing to work on decreasing depression symptoms,?SI, and anxiety symptoms,?and increasing the ability to self manage symptoms.     Collaboration of Care: Medication Management AEB Staci Kerns, NP  Patient/Guardian was advised Release of Information must be obtained prior to any record release in order to collaborate their care with an outside provider. Patient/Guardian was advised if they have not already done so to contact the registration department to sign all necessary forms in order for us  to release information regarding their care.   Consent: Patient/Guardian gives verbal consent for treatment and assignment of benefits for services provided during this visit. Patient/Guardian expressed understanding and agreed to proceed.   Diagnosis: Severe episode of recurrent major depressive disorder, without psychotic features (HCC) [F33.2]    1. Severe episode of recurrent major depressive disorder, without psychotic features (HCC)   2. GAD (generalized anxiety disorder)       Randall Bastos, LCSW

## 2024-10-06 NOTE — Psych (Signed)
 [facility] BH PHP THERAPIST PROGRESS NOTE  Stephanie Riley [id number]   Session Time: 9:00 am - 10:00 am  Participation Level: Active  Behavioral Response: CasualAlertAnxious and Depressed  Type of Therapy: Group Therapy  Treatment Goals addressed: Coping  Progress Towards Goals: Initial  Interventions: CBT, DBT, Solution Focused, Strength-based, Supportive, and Reframing  Therapist Response: Clinician led check-in regarding current stressors and situation, and review of patient completed daily inventory. Clinician utilized active listening and empathetic response and validated patient emotions. Clinician facilitated processing group on pertinent issues.?   Summary: Patient arrived within time allowed. Patient rates their depression at a 9 and anxiety at a 8 on a scale of 1-10 with 10 being high. Pt reports they slept 8 hours last night and ate 3x yesterday. Pt reports she wants to be the [place] I lost and get back to myself. Pt reports continued struggle with her husband and which version of him she'll get. Pt identifies struggling with shame. Pt denied experiencing SI/SH thoughts. Pt able to process.?Pt engaged in discussion.?       Session Time: 10:00 am - 11:00 am  Participation Level: Active  Behavioral Response: CasualAlertAnxious and Depressed  Type of Therapy: Group Therapy  Treatment Goals addressed: Coping  Progress Towards Goals: Initial  Interventions: CBT, DBT, Solution Focused, Strength-based, Supportive, and Reframing  Therapist Response: Cln led discussion on forgiveness. Group members shared ways in which they struggle with forgiveness and how it has hurt them. Cln provided space for group to process. Cln encouraged pt's to consider forgiveness as a journey to free themselves from something holding them back.   Summary: Pt engaged in discussion and is able to process.       Session Time: 11:00 am - 12:00 pm  Participation Level: Active  Behavioral  Response: CasualAlertAnxious  Type of Therapy: Group Therapy  Treatment Goals addressed: Coping  Progress Towards Goals: Progressing  Interventions: CBT, DBT, Solution Focused, Strength-based, Supportive, and Reframing  Therapist Response: Cln led discussion on how to fill unplanned time. Group members shared ways in which downtime negatively impacts mental health and often causes them to dwell on negative thinking. Group brainstormed ways to manage down time and problem solved how to handle barriers.   Summary: Pt engaged in discussion.     Session Time: 12:00 pm - 1:00 pm  Participation Level: Active  Behavioral Response: CasualAlertAnxious  Type of Therapy: Group Therapy  Treatment Goals addressed: Coping  Progress Towards Goals: Progressing  Interventions: CBT, DBT, Supportive, Reframing  Therapist Response: 12:00 - 12:50 Cln led coping skill toolbox activity in which group members compiled DBT coping skills and created tangible reminders of how and when to utilize the skills. Each group member personalized the skills to their preferences and made a kit they can go to that holds everything they need to cope when elevated.  12:50 - 1:00 pm: Clinician led check-out. Clinician assessed for immediate needs, medication compliance and efficacy, and safety concerns.  Summary: 12:00 - 12:50 Pt engaged and participated in activity.  12:50 - 1:00 At check-out, patient contracts for safety.?Patient demonstrates progress as evidenced by her engagement and by being receptive to treatment. Patient denies SI/HI/self-harm thoughts at the end of group and agrees to seek help should those thoughts/feelings occur.?    Suicidal/Homicidal: Nowithout intent/plan  Plan: ?Pt will continue in PHP and medication management while continuing to work on decreasing depression symptoms,?SI, and anxiety symptoms,?and increasing the ability to self manage symptoms.     Collaboration of  Care:  Medication Management AEB Staci Kerns, NP  Patient/Guardian was advised Release of Information must be obtained prior to any record release in order to collaborate their care with an outside provider. Patient/Guardian was advised if they have not already done so to contact the registration department to sign all necessary forms in order for us  to release information regarding their care.   Consent: Patient/Guardian gives verbal consent for treatment and assignment of benefits for services provided during this visit. Patient/Guardian expressed understanding and agreed to proceed.   Diagnosis: Severe episode of recurrent major depressive disorder, without psychotic features (HCC) [F33.2]    1. Severe episode of recurrent major depressive disorder, without psychotic features (HCC)   2. GAD (generalized anxiety disorder)       Randall Bastos, LCSW

## 2024-10-09 ENCOUNTER — Ambulatory Visit (HOSPITAL_COMMUNITY)

## 2024-10-10 ENCOUNTER — Ambulatory Visit (INDEPENDENT_AMBULATORY_CARE_PROVIDER_SITE_OTHER): Admitting: Professional

## 2024-10-10 ENCOUNTER — Ambulatory Visit (HOSPITAL_COMMUNITY)

## 2024-10-10 DIAGNOSIS — F332 Major depressive disorder, recurrent severe without psychotic features: Secondary | ICD-10-CM | POA: Diagnosis not present

## 2024-10-10 DIAGNOSIS — F411 Generalized anxiety disorder: Secondary | ICD-10-CM

## 2024-10-10 DIAGNOSIS — R4589 Other symptoms and signs involving emotional state: Secondary | ICD-10-CM

## 2024-10-10 NOTE — Psych (Signed)
 [facility] BH PHP THERAPIST PROGRESS NOTE  Stephanie Riley [id number]   Session Time: 9:00 am - 10:00 am  Participation Level: Active  Behavioral Response: CasualAlertAnxious and Depressed  Type of Therapy: Group Therapy  Treatment Goals addressed: Coping  Progress Towards Goals: Progressing  Interventions: CBT, DBT, Solution Focused, Strength-based, Supportive, and Reframing  Therapist Response: Clinician led check-in regarding current stressors and situation, and review of patient completed daily inventory. Clinician utilized active listening and empathetic response and validated patient emotions. Clinician facilitated processing group on pertinent issues.?   Summary: Patient arrived within time allowed. Patient rates their depression at a 6 and anxiety at a 7 on a scale of 1-10 with 10 being best. When asked about sleep and appetite, pt reports they slept 8 hours last night and ate 3-4x yesterday. Pt denied experiencing SI/SH thoughts or feelings of hopelessness since last session. She reports she is looking forward to having family counseling with her daughter, but is anxious about what to share and in what way. Pt able to process.?Pt engaged in discussion.?      Session Time: 10:00 am - 11:00 am  Participation Level: Active  Behavioral Response: CasualAlertAnxious and Depressed  Type of Therapy: Group Therapy  Treatment Goals addressed: Coping  Progress Towards Goals: Progressing  Interventions: CBT, DBT, Solution Focused, Strength-based, Supportive, and Reframing  Therapist Response: Clinician led processing group for pt's current struggles. Group members shared stressors and provided support and feedback. Clinician brought in topics of power of choice and making decisions using Wise mind.  Summary: Pt able to process and provide support to group.       Session Time: 11:00 am - 12:00 pm   Participation Level: Active   Behavioral Response: Casual Alert and  Anxious/Depressed   Type of Therapy: Group Therapy   Treatment Goals addressed: Coping   Progress Towards Goals: Progressing   Interventions: CBT, DBT, Solution Focused, Strength-based, Supportive, and Reframing   Therapist Response: Group was led by [facility] chaplain, Amy Delores.   Summary: Pt engaged in discussion.      Session Time: 12:00 pm - 12:30 pm   Participation Level: Active   Behavioral Response: Casual Alert and Anxious/Depressed   Type of Therapy: Group Therapy   Treatment Goals addressed: Coping   Progress Towards Goals: Progressing   Interventions: Psychologist, Occupational, Supportive   Therapist Response: Reflection Group: Patients encouraged to practice skills and interpersonal techniques or work on mindfulness and relaxation techniques. The importance of self-care and making skills part of a routine to increase usage were stressed.   Summary: Pt engaged in discussion.      Session Time: 12:30 pm - 1:30 pm   Participation Level: Active   Behavioral Response: Casual Alert and Anxious/Depressed   Type of Therapy: Group Therapy   Treatment Goals addressed: Coping   Progress Towards Goals: Progressing   Interventions: OT group   Therapist Response: Group was led by occupational therapist, Edward Hollan.    Summary: Pt engaged in discussion.            Session Time: 1:30 pm - 1:45pm   Participation Level: Active   Behavioral Response: Casual Alert and Anxious/Depressed   Type of Therapy: Group Therapy   Treatment Goals addressed: Coping   Progress Towards Goals: Progressing   Interventions: CBT, DBT, Solution Focused, Strength-based, Supportive, and Reframing   Therapist Response: 1:30 - 1:45 pm: Clinician led check-out. Clinician assessed for immediate needs, medication compliance and efficacy, and safety concerns?  Summary: 1:30 pm - 1:45 pm: At check-out, patient reports no immediate concerns. Patient demonstrates progress as  evidenced by engagement and responsiveness to treatment. Patient denies SI/HI/self-harm thoughts at the end of group.   Suicidal/Homicidal: Nowithout intent/plan  Plan: ?Pt will continue in PHP and medication management while continuing to work on decreasing depression symptoms,?SI, and anxiety symptoms,?and increasing the ability to self manage symptoms.    Collaboration of Care: Medication Management AEB Staci Kerns, NP and Other RN Hildegard Macadam  Patient/Guardian was advised Release of Information must be obtained prior to any record release in order to collaborate their care with an outside provider. Patient/Guardian was advised if they have not already done so to contact the registration department to sign all necessary forms in order for us  to release information regarding their care.   Consent: Patient/Guardian gives verbal consent for treatment and assignment of benefits for services provided during this visit. Patient/Guardian expressed understanding and agreed to proceed.   Diagnosis: Severe episode of recurrent major depressive disorder, without psychotic features (HCC) [F33.2]    1. Severe episode of recurrent major depressive disorder, without psychotic features (HCC)   2. GAD (generalized anxiety disorder)       [name], [facility] 10/10/2024

## 2024-10-10 NOTE — Progress Notes (Signed)
 Spoke with patient in person for PHP. States that groups are going well. She did not attend yesterday since she had a therapy appointment at the same time. On scale 1-10 as 10 being worst she rates depression at 2 and anxiety at 3. Denies SI/HI or AVH. No issue or complaints. No side effects from medication.

## 2024-10-11 ENCOUNTER — Ambulatory Visit (INDEPENDENT_AMBULATORY_CARE_PROVIDER_SITE_OTHER): Admitting: Licensed Clinical Social Worker

## 2024-10-11 ENCOUNTER — Ambulatory Visit (HOSPITAL_COMMUNITY)

## 2024-10-11 ENCOUNTER — Encounter (HOSPITAL_COMMUNITY): Payer: Self-pay

## 2024-10-11 DIAGNOSIS — R4589 Other symptoms and signs involving emotional state: Secondary | ICD-10-CM

## 2024-10-11 DIAGNOSIS — F332 Major depressive disorder, recurrent severe without psychotic features: Secondary | ICD-10-CM | POA: Diagnosis not present

## 2024-10-11 NOTE — Progress Notes (Signed)
 BH MD/PA/NP OP Progress Note  10/14/2024 10:10 AM Stephanie Riley  MRN:  [id number]  Chief Complaint: Weekly, assessment while attending partial hospitalization outpatient programming. PHP  HPI: Stephanie Riley is a 73 year old Caucasian female who presents due to worsening depression and difficulty coping.  Prior to attending partial hospitalization outpatient programming.  Patient was discharged from [facility] due to suicidal ideations and increased depression symptoms.  Reports overall her mood is stabilized.  Some confusion is related to medication as she continues to be followed by multiple specialists.  She reports a fair appetite.  States she is resting okay throughout the night.  Can be difficult to follow but are easily redirectable.  Patient is a poor historian.  Reports she has been sober since March 2025.  Denied alcohol cravings as she states she currently attends AA meetings.  Has a few missed appointments while enrolled in partial hospitalization programming due to reports medical diagnoses.  States she has utilized and learned multiple coping skills however is unable to recall at this time.  Rating her depression and anxiety 6 out of 10 with 10 being the worst.  Manageable.  No other documented concerns.  Visit Diagnosis:    ICD-10-CM   1. Severe episode of recurrent major depressive disorder, without psychotic features (HCC)  F33.2     2. Difficulty coping  R45.89       Past Psychiatric History: Previous inpatient admissions.  Major depressive disorder, generalized anxiety disorder, substance-induced mood disorder.  Alcohol abuse and reported history with C PTSD.  Advised to follow-up with DBT therapy at discharge.  Past Medical History:  Past Medical History:  Diagnosis Date   Alcohol abuse    Anxiety    Benzodiazepine withdrawal without complication (HCC) 02/12/2023   Depression    HTN (hypertension)    PTSD (post-traumatic stress disorder)     Past Surgical  History:  Procedure Laterality Date   CYSTECTOMY Left    breast cyst removed many years ago    Family Psychiatric History:   Family History: No family history on file.  Social History:  Social History   Socioeconomic History   Marital status: Divorced    Spouse name: Not on file   Number of children: 1   Years of education: Not on file   Highest education level: Bachelor's degree (e.g., BA, AB, BS)  Occupational History   Not on file  Tobacco Use   Smoking status: Never   Smokeless tobacco: Never  Vaping Use   Vaping status: Never Used  Substance and Sexual Activity   Alcohol use: Not Currently    Comment: sober since March 7th 2025   Drug use: Never   Sexual activity: Not Currently  Other Topics Concern   Not on file  Social History Narrative   Not on file   Social Drivers of Health   Financial Resource Strain: Low Risk  (10/19/2022)   Received from [facility]   Overall Financial Resource Strain (CARDIA)    Difficulty of Paying Living Expenses: Not hard at all  Food Insecurity: No Food Insecurity (02/12/2024)   Hunger Vital Sign    Worried About Running Out of Food in the Last Year: Never true    Ran Out of Food in the Last Year: Never true  Transportation Needs: No Transportation Needs (02/12/2024)   PRAPARE - Administrator, Civil Service (Medical): No    Lack of Transportation (Non-Medical): No  Physical Activity: Insufficiently Active (10/19/2022)   Received  from [facility]   Exercise Vital Sign    On average, how many days per week do you engage in moderate to strenuous exercise (like a brisk walk)?: 3 days    On average, how many minutes do you engage in exercise at this level?: 20 min  Stress: No Stress Concern Present (10/19/2022)   Received from [facility] - Occupational Stress Questionnaire    Feeling of Stress : Not at all  Social Connections: Moderately Isolated (02/12/2024)   Social  Connection and Isolation Panel    Frequency of Communication with Friends and Family: Once a week    Frequency of Social Gatherings with Friends and Family: Once a week    Attends Religious Services: 1 to 4 times per year    Active Member of Clubs or Organizations: No    Attends Banker Meetings: 1 to 4 times per year    Marital Status: Divorced    Allergies: No Known Allergies  Metabolic Disorder Labs: Lab Results  Component Value Date   HGBA1C 5.2 02/18/2024   MPG 102.54 02/18/2024   MPG 93.93 05/25/2023   No results found for: PROLACTIN Lab Results  Component Value Date   CHOL 162 02/18/2024   TRIG 85 02/18/2024   HDL 49 02/18/2024   CHOLHDL 3.3 02/18/2024   VLDL 17 02/18/2024   LDLCALC 96 02/18/2024   LDLCALC 41 05/25/2023   Lab Results  Component Value Date   TSH 3.122 02/10/2024   TSH 1.830 05/25/2023    Therapeutic Level Labs: No results found for: LITHIUM No results found for: VALPROATE No results found for: CBMZ  Current Medications: Current Outpatient Medications  Medication Sig Dispense Refill   amLODipine  (NORVASC ) 10 MG tablet Take 1 tablet (10 mg total) by mouth daily. 30 tablet 0   ARIPiprazole  (ABILIFY ) 5 MG tablet Take 1 tablet (5 mg total) by mouth at bedtime. (Patient not taking: Reported on 09/19/2024) 30 tablet 0   B Complex Vitamins (B COMPLEX PO) Take 1 tablet by mouth daily.     buPROPion (WELLBUTRIN XL) 150 MG 24 hr tablet Take 150 mg by mouth every morning.     busPIRone  (BUSPAR ) 15 MG tablet Take 1 tablet (15 mg total) by mouth 3 (three) times daily. (Patient taking differently: Take 15 mg by mouth 3 (three) times daily. Takes 30mg  in am and 30mg  qhs) 90 tablet 0   doxepin  (SINEQUAN ) 50 MG capsule Take 1 capsule (50 mg total) by mouth at bedtime. 30 capsule 0   ferrous sulfate 325 (65 FE) MG tablet Take 325 mg by mouth every morning.     gabapentin  (NEURONTIN ) 100 MG capsule Take 1 capsule (100 mg total) by mouth 2  (two) times daily. (Patient not taking: Reported on 09/19/2024) 60 capsule 0   gabapentin  (NEURONTIN ) 300 MG capsule Take 1 capsule (300 mg total) by mouth at bedtime. (Patient not taking: Reported on 09/19/2024) 30 capsule 0   hydrOXYzine  (ATARAX ) 25 MG tablet Take 1 tablet (25 mg total) by mouth every 6 (six) hours as needed for anxiety. (Patient not taking: Reported on 09/19/2024) 30 tablet 0   LORazepam  (ATIVAN ) 0.5 MG tablet Take 0.5-1 mg by mouth 4 (four) times daily as needed.     mirtazapine  (REMERON  SOLTAB) 45 MG disintegrating tablet Take 1 tablet (45 mg total) by mouth at bedtime. 90 tablet 0   Multiple Vitamins-Minerals (ADULT GUMMY PO) Take 2 tablets by mouth daily.  sertraline  (ZOLOFT ) 100 MG tablet Take 2 tablets (200 mg total) by mouth daily. 30 tablet 0   traZODone  (DESYREL ) 100 MG tablet Take 1 tablet (100 mg total) by mouth at bedtime. 30 tablet 0   No current facility-administered medications for this visit.     Musculoskeletal: Strength & Muscle Tone: within normal limits Gait & Station: normal Patient leans: N/A  Psychiatric Specialty Exam: Review of Systems  There were no vitals taken for this visit.There is no height or weight on file to calculate BMI.  General Appearance: Casual  Eye Contact:  Good  Speech:  Clear and Coherent  Volume:  Normal  Mood:  Anxious and Depressed  Affect:  Congruent  Thought Process:  Coherent  Orientation:  Full (Time, Place, and Person)  Thought Content: Logical   Suicidal Thoughts:  No  Homicidal Thoughts:  No  Memory:  Immediate;   Good Recent;   Good  Judgement:  Good  Insight:  Good  Psychomotor Activity:  Normal  Concentration:  Concentration: Good  Recall:  Good  Fund of Knowledge: Good  Language: Good  Akathisia:  No  Handed:  Right  AIMS (if indicated): not done  Assets:  Communication Skills Desire for Improvement  ADL's:  Intact  Cognition: WNL  Sleep:  Good   Screenings: AUDIT    Flowsheet Row  Admission (Discharged) from 02/12/2024 in [facility] BE[facility]AL [facility] ED from 05/25/2023 in [facility] Emergency Department at [facility]  Alcohol Use Disorder Identification Test Final Score (AUDIT) 9 27   GAD-7    Flowsheet Row Counselor from 09/25/2024 in [facility] Counselor from 09/19/2024 in [facility]  Total GAD-7 Score 15 16   PHQ2-9    Flowsheet Row Counselor from 09/25/2024 in [facility] Counselor from 09/19/2024 in [facility] Counselor from 09/04/2024 in [facility] ED from 02/10/2024 in [facility] ED from 05/25/2023 in [facility]  PHQ-2 Total Score 6 6 6 6 6   PHQ-9 Total Score 15 15 17 24 9    Flowsheet Row Admission (Discharged) from 02/12/2024 in [facility] ED from 02/10/2024 in [facility] ED to Hosp-Admission (Discharged) from 09/03/2023 in [facility] FLO[facility] PROGRESSIVE CARE AND UROLOGY  C-SSRS RISK CATEGORY No Risk No Risk No Risk     Assessment and Plan:  Continue partial hospitalization outpatient programming Continue medications as directed  Collaboration of Care: Collaboration of Care: Primary Care Provider AEB and current psychiatrist  Patient/Guardian was advised Release of Information must be obtained prior to any record release in order to collaborate their care with an outside provider. Patient/Guardian was advised if they have not already done so to contact the registration department to sign all necessary forms in order for us  to release information regarding their care.   Consent: Patient/Guardian gives verbal consent for treatment and assignment of benefits for services provided during this visit. Patient/Guardian expressed understanding and agreed to proceed.    Stephanie LOISE Kerns,  NP 10/14/2024, 10:10 AM

## 2024-10-11 NOTE — Therapy (Signed)
 [facility] [address] [place], [place], [address] Phone: [phone]   Fax:  [phone]  Occupational Therapy Treatment  Patient Details  Name: Stephanie Riley MRN: [id number] Date of Birth: August 19, 1951 No data recorded  Encounter Date: 10/04/2024   OT End of Session - 10/11/24 1948     Visit Number 4    Number of Visits 15    Date for Recertification  10/09/24    OT Start Time 1230    OT Stop Time 1330    OT Time Calculation (min) 60 min          Past Medical History:  Diagnosis Date   Alcohol abuse    Anxiety    Benzodiazepine withdrawal without complication (HCC) 02/12/2023   Depression    HTN (hypertension)    PTSD (post-traumatic stress disorder)     Past Surgical History:  Procedure Laterality Date   CYSTECTOMY Left    breast cyst removed many years ago    There were no vitals filed for this visit.   Subjective Assessment - 10/11/24 1948     Currently in Pain? No/denies    Pain Score 0-No pain                Group Session:  S: feeling better  O: During the group therapy session, the occupational therapist discussed the impact of sleep disturbances on daily activities and overall health and wellbeing.   The OT also reviewed various types of sleep disorders, including insomnia, sleep apnea, restless leg syndrome, and narcolepsy, and their associated symptoms. Strategies for managing and treating sleep disturbances were also discussed, such as establishing a consistent sleep routine, avoiding stimulants before bedtime, and engaging in relaxation techniques.   Today's group also included information on how sleep disturbances can cause fatigue, mood changes, cognitive impairment, and physical health problems, and emphasizes the importance of seeking prompt treatment to maintain overall health and wellbeing.   A: In today's session, the patient demonstrated active engagement with the topic of The Importance  of Sleep. They eagerly asked questions, contributed personal experiences, and showcased a noticeable eagerness to apply the discussed principles. Their participation indicated not only a strong understanding of the subject matter but also an intrinsic motivation to implement better sleep practices in their daily routine. Based on their proactive involvement, it is assessed that the patient greatly benefited from today's treatment and will likely make efforts to incorporate the insights gained.    P: Continue to attend PHP OT group sessions 5x week for 4 weeks to promote daily structure, social engagement, and opportunities to develop and utilize adaptive strategies to maximize functional performance in preparation for safe transition and integration back into school, work, and the community. Plan to address topic of tbd in next OT group session.                OT Education - 10/11/24 1948     Education Details Sleep           OT Short Term Goals - 10/01/24 1251       OT SHORT TERM GOAL #1   Title Patient will identify and demonstrate at least two grounding or coping strategies to manage emotional distress, reporting decreased intensity of negative thoughts or anxiety during group participation and daily activities.    Time 4    Period Weeks    Status On-going    Target Date 10/09/24      OT SHORT TERM GOAL #  2   Title atient will develop and implement a structured daily routine that incorporates at least one productive, one self-care, and one leisure activity to improve consistency, balance, and sense of purpose throughout the week.    Status On-going      OT SHORT TERM GOAL #3   Title Patient will identify one personal goal related to daily functioning or wellness and apply the SMART goal framework to create a clear, measurable, and achievable plan to improve engagement in meaningful roles.    Status On-going                   Plan - 10/11/24 1949      Psychosocial Skills Interpersonal Interaction;Habits;Environmental  Adaptations;Routines and Behaviors;Coping Strategies          Patient will benefit from skilled therapeutic intervention in order to improve the following deficits and impairments:       Psychosocial Skills: Interpersonal Interaction, Habits, Environmental  Adaptations, Routines and Behaviors, Coping Strategies   Visit Diagnosis: Difficulty coping    Problem List Patient Active Problem List   Diagnosis Date Noted   Anxiety disorder 02/21/2024   MDD (major depressive disorder), recurrent episode 02/12/2024   Alcohol use disorder, severe, in early remission (HCC) 10/22/2023   GAD (generalized anxiety disorder) 10/22/2023   Severe episode of recurrent major depressive disorder, without psychotic features (HCC) 10/22/2023   Hypophosphatemia 09/05/2023   Alcoholic ketoacidosis 09/04/2023   Lactic acidosis 09/04/2023   SIRS (systemic inflammatory response syndrome) (HCC) 09/04/2023   Transaminitis 09/04/2023   Elevated CK 09/04/2023   Hypokalemia 09/04/2023   Hypocalcemia 09/04/2023   COVID-19 virus infection 09/04/2023   Essential hypertension 09/04/2023   Aspiration into airway 09/04/2023   Alcohol use disorder 03/19/2023   Alcohol use disorder, moderate, dependence (HCC) 02/12/2023    [name], OT 10/11/2024, 7:49 PM  [name], OT    [facility] [address] [place], [place], [address] Phone: [phone]   Fax:  [phone]  Name: Stephanie Riley MRN: [id number] Date of Birth: [birth year]

## 2024-10-12 ENCOUNTER — Ambulatory Visit (INDEPENDENT_AMBULATORY_CARE_PROVIDER_SITE_OTHER): Admitting: Licensed Clinical Social Worker

## 2024-10-12 ENCOUNTER — Ambulatory Visit (HOSPITAL_COMMUNITY)

## 2024-10-12 DIAGNOSIS — F332 Major depressive disorder, recurrent severe without psychotic features: Secondary | ICD-10-CM

## 2024-10-12 DIAGNOSIS — R4589 Other symptoms and signs involving emotional state: Secondary | ICD-10-CM

## 2024-10-12 DIAGNOSIS — F411 Generalized anxiety disorder: Secondary | ICD-10-CM

## 2024-10-13 ENCOUNTER — Ambulatory Visit (HOSPITAL_COMMUNITY)

## 2024-10-13 ENCOUNTER — Ambulatory Visit (INDEPENDENT_AMBULATORY_CARE_PROVIDER_SITE_OTHER): Admitting: Licensed Clinical Social Worker

## 2024-10-13 DIAGNOSIS — R4589 Other symptoms and signs involving emotional state: Secondary | ICD-10-CM

## 2024-10-13 DIAGNOSIS — F411 Generalized anxiety disorder: Secondary | ICD-10-CM

## 2024-10-13 DIAGNOSIS — F332 Major depressive disorder, recurrent severe without psychotic features: Secondary | ICD-10-CM | POA: Diagnosis not present

## 2024-10-16 ENCOUNTER — Encounter (HOSPITAL_COMMUNITY): Payer: Self-pay

## 2024-10-16 NOTE — Therapy (Signed)
 [facility] [address] [place], [place], [address] Phone: [phone]   Fax:  [phone]  Occupational Therapy Treatment  Patient Details  Name: Stephanie Riley MRN: [id number] Date of Birth: 03-19-1951 No data recorded  Encounter Date: 10/05/2024   OT End of Session - 10/16/24 2101     Visit Number 5    Number of Visits 15    Date for Recertification  10/09/24    OT Start Time 1230    OT Stop Time 1330    OT Time Calculation (min) 60 min          Past Medical History:  Diagnosis Date   Alcohol abuse    Anxiety    Benzodiazepine withdrawal without complication (HCC) 02/12/2023   Depression    HTN (hypertension)    PTSD (post-traumatic stress disorder)     Past Surgical History:  Procedure Laterality Date   CYSTECTOMY Left    breast cyst removed many years ago    There were no vitals filed for this visit.   Subjective Assessment - 10/16/24 2101     Currently in Pain? No/denies    Pain Score 0-No pain               Group Session:  S: Feeling better today.   O: During the group therapy session, the occupational therapist discussed the impact of sleep disturbances on daily activities and overall health and wellbeing.   The OT also reviewed various types of sleep disorders, including insomnia, sleep apnea, restless leg syndrome, and narcolepsy, and their associated symptoms. Strategies for managing and treating sleep disturbances were also discussed, such as establishing a consistent sleep routine, avoiding stimulants before bedtime, and engaging in relaxation techniques.   Today's group also included information on how sleep disturbances can cause fatigue, mood changes, cognitive impairment, and physical health problems, and emphasizes the importance of seeking prompt treatment to maintain overall health and wellbeing.   A: In today's session, the patient demonstrated active engagement with the topic of The  Importance of Sleep. They eagerly asked questions, contributed personal experiences, and showcased a noticeable eagerness to apply the discussed principles. Their participation indicated not only a strong understanding of the subject matter but also an intrinsic motivation to implement better sleep practices in their daily routine. Based on their proactive involvement, it is assessed that the patient greatly benefited from today's treatment and will likely make efforts to incorporate the insights gained.    P: Continue to attend PHP OT group sessions 5x week for 4 weeks to promote daily structure, social engagement, and opportunities to develop and utilize adaptive strategies to maximize functional performance in preparation for safe transition and integration back into school, work, and the community. Plan to address topic of tbd in next OT group session.                 OT Education - 10/16/24 2101     Education Details Sleep           OT Short Term Goals - 10/01/24 1251       OT SHORT TERM GOAL #1   Title Patient will identify and demonstrate at least two grounding or coping strategies to manage emotional distress, reporting decreased intensity of negative thoughts or anxiety during group participation and daily activities.    Time 4    Period Weeks    Status On-going    Target Date 10/09/24      OT SHORT  TERM GOAL #2   Title atient will develop and implement a structured daily routine that incorporates at least one productive, one self-care, and one leisure activity to improve consistency, balance, and sense of purpose throughout the week.    Status On-going      OT SHORT TERM GOAL #3   Title Patient will identify one personal goal related to daily functioning or wellness and apply the SMART goal framework to create a clear, measurable, and achievable plan to improve engagement in meaningful roles.    Status On-going                   Plan - 10/16/24 2101      Psychosocial Skills Interpersonal Interaction;Habits;Environmental  Adaptations;Routines and Behaviors;Coping Strategies          Patient will benefit from skilled therapeutic intervention in order to improve the following deficits and impairments:       Psychosocial Skills: Interpersonal Interaction, Habits, Environmental  Adaptations, Routines and Behaviors, Coping Strategies   Visit Diagnosis: Difficulty coping    Problem List Patient Active Problem List   Diagnosis Date Noted   Anxiety disorder 02/21/2024   MDD (major depressive disorder), recurrent episode 02/12/2024   Alcohol use disorder, severe, in early remission (HCC) 10/22/2023   GAD (generalized anxiety disorder) 10/22/2023   Severe episode of recurrent major depressive disorder, without psychotic features (HCC) 10/22/2023   Hypophosphatemia 09/05/2023   Alcoholic ketoacidosis 09/04/2023   Lactic acidosis 09/04/2023   SIRS (systemic inflammatory response syndrome) (HCC) 09/04/2023   Transaminitis 09/04/2023   Elevated CK 09/04/2023   Hypokalemia 09/04/2023   Hypocalcemia 09/04/2023   COVID-19 virus infection 09/04/2023   Essential hypertension 09/04/2023   Aspiration into airway 09/04/2023   Alcohol use disorder 03/19/2023   Alcohol use disorder, moderate, dependence (HCC) 02/12/2023    Stephanie Riley, OT 10/16/2024, 9:02 PM  Stephanie Riley, OT   [facility] [address] [place], [place], [address] Phone: [phone]   Fax:  [phone]  Name: Stephanie Riley MRN: [id number] Date of Birth: October 26, 1951

## 2024-10-17 ENCOUNTER — Ambulatory Visit (HOSPITAL_COMMUNITY)

## 2024-10-18 ENCOUNTER — Ambulatory Visit (HOSPITAL_COMMUNITY)

## 2024-10-19 ENCOUNTER — Ambulatory Visit (HOSPITAL_COMMUNITY)

## 2024-10-19 ENCOUNTER — Telehealth (HOSPITAL_COMMUNITY): Payer: Self-pay | Admitting: Psychiatry

## 2024-10-19 NOTE — Telephone Encounter (Signed)
 D:  PHP referred pt to virtual MH-IOP; but unfortunately management has requested there be a hold on taking patients with any form of Medicare at this time.  Apparently, patients with Medicare are only to be seen in person only.  A:  Placed call to inform pt.  Provided patient with [facility] information and encouraged her to call them for groups in person.  R:  Pt receptive.

## 2024-10-20 ENCOUNTER — Ambulatory Visit (HOSPITAL_COMMUNITY)

## 2024-10-20 NOTE — Progress Notes (Signed)
  [facility] Behavioral Health Partial outpatient [facility] Discharge Summary  Quantasia Stegner Dufault [id number]  Admission date: 09/19/2024 Discharge date: 10/11/2024  Reason for admission:per admission assessment note: Sydnie Sigmund is a 73 year old Caucasian female who presents due to worsening depression and difficulty coping.  Prior to attending partial hospitalization outpatient programming.  Patient was discharged from [place] [facility] due to suicidal ideations and increased depression symptoms.  Reports overall her mood is stabilized.  Some confusion is related to medication as she continues to be followed by multiple specialists.  She reports a fair appetite.  States she is resting okay throughout the night.  Can be difficult to follow but are easily redirectable.  Patient is a poor historia n.  Chemical Use History: Alcohol misuse currently attending AA meetings  Family of Origin Issues: Reported feeling abandoned and limited support by family stay referring to husband and daughter moved out of the family home  Progress in Program Toward Treatment Goals: Progressing patient attended and participated with daily groups session sporadically.  It was reported that patient had plans to stepdown to intensive outpatient programming.  Patient was not seen at discharge  Progress (rationale): Patient to follow-up with intensive outpatient programming  Collaboration of Care: Medication Management AEB continue medications as directed  Patient/Guardian was advised Release of Information must be obtained prior to any record release in order to collaborate their care with an outside provider. Patient/Guardian was advised if they have not already done so to contact the registration department to sign all necessary forms in order for us  to release information regarding their care.   Consent: Patient/Guardian gives verbal consent for treatment and assignment of benefits for services provided  during this visit. Patient/Guardian expressed understanding and agreed to proceed.   Staci Kerns NP  10/11/2024

## 2024-10-23 ENCOUNTER — Encounter (HOSPITAL_COMMUNITY): Payer: Self-pay

## 2024-10-23 NOTE — Therapy (Signed)
 [facility] [address] [place], [place], [address] Phone: [phone]   Fax:  [phone]  Occupational Therapy Treatment  Patient Details  Name: Stephanie Riley MRN: [id number] Date of Birth: 1951-11-17 No data recorded  Encounter Date: 10/10/2024   OT End of Session - 10/23/24 2116     Visit Number 6    Number of Visits 15    Date for Recertification  10/09/24    OT Start Time 1230    OT Stop Time 1330    OT Time Calculation (min) 60 min          Past Medical History:  Diagnosis Date   Alcohol abuse    Anxiety    Benzodiazepine withdrawal without complication (HCC) 02/12/2023   Depression    HTN (hypertension)    PTSD (post-traumatic stress disorder)     Past Surgical History:  Procedure Laterality Date   CYSTECTOMY Left    breast cyst removed many years ago    There were no vitals filed for this visit.   Subjective Assessment - 10/23/24 2116     Currently in Pain? No/denies    Pain Score 0-No pain            Group Session:  S: Overall, doing better today  O: The primary objective of this topic is to explore and understand the concept of occupational balance in the context of daily living. The term occupational balance is defined broadly, encompassing all activities that occupy an individual's time and energy, including self-care, leisure, and work-related tasks. The goal is to guide participants towards achieving a harmonious blend of these activities, tailored to their personal values and life circumstances. This balance is aimed at enhancing overall well-being, not by equally distributing time across activities, but by ensuring that daily engagements are fulfilling and not draining. The content delves into identifying various barriers that individuals face in achieving occupational balance, such as overcommitment, misaligned priorities, external pressures, and lack of effective time management. The impact of  these barriers on occupational performance, roles, and lifestyles is examined, highlighting issues like reduced efficiency, strained relationships, and potential health problems. Strategies for cultivating occupational balance are a key focus. These strategies include practical methods like time blocking, prioritizing tasks, establishing self-care rituals, decluttering, connecting with nature, and engaging in reflective practices. These approaches are designed to be adaptable and applicable to a wide range of life scenarios, promoting a proactive and mindful approach to daily living. The overall aim is to equip participants with the knowledge and tools to create a balanced lifestyle that supports their mental, emotional, and physical health, thereby improving their functional performance in daily life.   A:  The patient demonstrated a high level of engagement and active participation throughout the session on occupational balance. The patient frequently contributed to discussions, offering insightful reflections on personal experiences related to the barriers and strategies for achieving occupational balance. There was a clear understanding of the concept and an ability to relate it to their own life. The patient showed enthusiasm in learning and applying the strategies discussed, such as time blocking and self-care rituals, indicating a strong motivation to improve their occupational balance. The patient's proactive approach and responsiveness to the topic suggest a high potential for implementing these strategies effectively in their daily routine.    P: Continue to attend PHP OT group sessions 5x week for 3 weeks to promote daily structure, social engagement, and opportunities to develop and utilize adaptive strategies to maximize  functional performance in preparation for safe transition and integration back into school, work, and the community. Plan to address topic of tbd in next OT group  session.                      OT Education - 10/23/24 2116     Education Details Occupational Balance 3           OT Short Term Goals - 10/01/24 1251       OT SHORT TERM GOAL #1   Title Patient will identify and demonstrate at least two grounding or coping strategies to manage emotional distress, reporting decreased intensity of negative thoughts or anxiety during group participation and daily activities.    Time 4    Period Weeks    Status On-going    Target Date 10/09/24      OT SHORT TERM GOAL #2   Title atient will develop and implement a structured daily routine that incorporates at least one productive, one self-care, and one leisure activity to improve consistency, balance, and sense of purpose throughout the week.    Status On-going      OT SHORT TERM GOAL #3   Title Patient will identify one personal goal related to daily functioning or wellness and apply the SMART goal framework to create a clear, measurable, and achievable plan to improve engagement in meaningful roles.    Status On-going                   Plan - 10/23/24 2117     Psychosocial Skills Interpersonal Interaction;Habits;Environmental  Adaptations;Routines and Behaviors;Coping Strategies          Patient will benefit from skilled therapeutic intervention in order to improve the following deficits and impairments:       Psychosocial Skills: Interpersonal Interaction, Habits, Environmental  Adaptations, Routines and Behaviors, Coping Strategies   Visit Diagnosis: Difficulty coping    Problem List Patient Active Problem List   Diagnosis Date Noted   Anxiety disorder 02/21/2024   MDD (major depressive disorder), recurrent episode 02/12/2024   Alcohol use disorder, severe, in early remission (HCC) 10/22/2023   GAD (generalized anxiety disorder) 10/22/2023   Severe episode of recurrent major depressive disorder, without psychotic features (HCC) 10/22/2023    Hypophosphatemia 09/05/2023   Alcoholic ketoacidosis 09/04/2023   Lactic acidosis 09/04/2023   SIRS (systemic inflammatory response syndrome) (HCC) 09/04/2023   Transaminitis 09/04/2023   Elevated CK 09/04/2023   Hypokalemia 09/04/2023   Hypocalcemia 09/04/2023   COVID-19 virus infection 09/04/2023   Essential hypertension 09/04/2023   Aspiration into airway 09/04/2023   Alcohol use disorder 03/19/2023   Alcohol use disorder, moderate, dependence (HCC) 02/12/2023    [name], OT 10/23/2024, 9:17 PM  [name], OT    [facility] [address] [place], [place], [address] Phone: [phone]   Fax:  [phone]  Name: Stephanie Riley MRN: [id number] Date of Birth: 02/06/1951

## 2024-10-23 NOTE — Therapy (Signed)
 [facility] [address] [place], [place], [address] Phone: [phone]   Fax:  [phone]  Occupational Therapy Treatment  Patient Details  Name: Stephanie Riley MRN: [id number] Date of Birth: 11/13/1951 No data recorded  Encounter Date: 10/13/2024   OT End of Session - 10/23/24 2215     Visit Number 8    Number of Visits 15    Date for Recertification  10/09/24    OT Start Time 1230    OT Stop Time 1330    OT Time Calculation (min) 60 min          Past Medical History:  Diagnosis Date   Alcohol abuse    Anxiety    Benzodiazepine withdrawal without complication (HCC) 02/12/2023   Depression    HTN (hypertension)    PTSD (post-traumatic stress disorder)     Past Surgical History:  Procedure Laterality Date   CYSTECTOMY Left    breast cyst removed many years ago    There were no vitals filed for this visit.   Subjective Assessment - 10/23/24 2215     Currently in Pain? No/denies    Pain Score 0-No pain             Group Session:  S: Feeling better today.   O: The primary objective of this topic is to explore and understand the concept of occupational balance in the context of daily living. The term occupational balance is defined broadly, encompassing all activities that occupy an individual's time and energy, including self-care, leisure, and work-related tasks. The goal is to guide participants towards achieving a harmonious blend of these activities, tailored to their personal values and life circumstances. This balance is aimed at enhancing overall well-being, not by equally distributing time across activities, but by ensuring that daily engagements are fulfilling and not draining. The content delves into identifying various barriers that individuals face in achieving occupational balance, such as overcommitment, misaligned priorities, external pressures, and lack of effective time management. The impact of these  barriers on occupational performance, roles, and lifestyles is examined, highlighting issues like reduced efficiency, strained relationships, and potential health problems. Strategies for cultivating occupational balance are a key focus. These strategies include practical methods like time blocking, prioritizing tasks, establishing self-care rituals, decluttering, connecting with nature, and engaging in reflective practices. These approaches are designed to be adaptable and applicable to a wide range of life scenarios, promoting a proactive and mindful approach to daily living. The overall aim is to equip participants with the knowledge and tools to create a balanced lifestyle that supports their mental, emotional, and physical health, thereby improving their functional performance in daily life.   A:  The patient demonstrated a high level of engagement and active participation throughout the session on occupational balance. The patient frequently contributed to discussions, offering insightful reflections on personal experiences related to the barriers and strategies for achieving occupational balance. There was a clear understanding of the concept and an ability to relate it to their own life. The patient showed enthusiasm in learning and applying the strategies discussed, such as time blocking and self-care rituals, indicating a strong motivation to improve their occupational balance. The patient's proactive approach and responsiveness to the topic suggest a high potential for implementing these strategies effectively in their daily routine.   P: Continue to attend PHP OT group sessions 5x week for 4 weeks to promote daily structure, social engagement, and opportunities to develop and utilize adaptive strategies to maximize  functional performance in preparation for safe transition and integration back into school, work, and the community. Plan to address topic of tbd in next OT group  session.                   OT Education - 10/23/24 2215     Education Details Occupational Balance           OT Short Term Goals - 10/01/24 1251       OT SHORT TERM GOAL #1   Title Patient will identify and demonstrate at least two grounding or coping strategies to manage emotional distress, reporting decreased intensity of negative thoughts or anxiety during group participation and daily activities.    Time 4    Period Weeks    Status On-going    Target Date 10/09/24      OT SHORT TERM GOAL #2   Title atient will develop and implement a structured daily routine that incorporates at least one productive, one self-care, and one leisure activity to improve consistency, balance, and sense of purpose throughout the week.    Status On-going      OT SHORT TERM GOAL #3   Title Patient will identify one personal goal related to daily functioning or wellness and apply the SMART goal framework to create a clear, measurable, and achievable plan to improve engagement in meaningful roles.    Status On-going                   Plan - 10/23/24 2215     Psychosocial Skills Interpersonal Interaction;Habits;Environmental  Adaptations;Routines and Behaviors;Coping Strategies          Patient will benefit from skilled therapeutic intervention in order to improve the following deficits and impairments:       Psychosocial Skills: Interpersonal Interaction, Habits, Environmental  Adaptations, Routines and Behaviors, Coping Strategies   Visit Diagnosis: Difficulty coping    Problem List Patient Active Problem List   Diagnosis Date Noted   Anxiety disorder 02/21/2024   MDD (major depressive disorder), recurrent episode 02/12/2024   Alcohol use disorder, severe, in early remission (HCC) 10/22/2023   GAD (generalized anxiety disorder) 10/22/2023   Severe episode of recurrent major depressive disorder, without psychotic features (HCC) 10/22/2023    Hypophosphatemia 09/05/2023   Alcoholic ketoacidosis 09/04/2023   Lactic acidosis 09/04/2023   SIRS (systemic inflammatory response syndrome) (HCC) 09/04/2023   Transaminitis 09/04/2023   Elevated CK 09/04/2023   Hypokalemia 09/04/2023   Hypocalcemia 09/04/2023   COVID-19 virus infection 09/04/2023   Essential hypertension 09/04/2023   Aspiration into airway 09/04/2023   Alcohol use disorder 03/19/2023   Alcohol use disorder, moderate, dependence (HCC) 02/12/2023    [name], OT 10/23/2024, 10:16 PM  [name], OT   [facility] [address] [place], [place], [address] Phone: [phone]   Fax:  [phone]  Name: ABBY TUCHOLSKI MRN: [id number] Date of Birth: October 22, 1951

## 2024-10-23 NOTE — Therapy (Signed)
 [facility] [address] [place], [place], [address] Phone: [phone]   Fax:  [phone]  Occupational Therapy Treatment  Patient Details  Name: Stephanie Riley MRN: [id number] Date of Birth: 11/30/1951 No data recorded  Encounter Date: 10/12/2024   OT End of Session - 10/23/24 2159     Visit Number 7    Number of Visits 15    Date for Recertification  10/09/24    OT Start Time 1230    OT Stop Time 1330    OT Time Calculation (min) 60 min          Past Medical History:  Diagnosis Date   Alcohol abuse    Anxiety    Benzodiazepine withdrawal without complication (HCC) 02/12/2023   Depression    HTN (hypertension)    PTSD (post-traumatic stress disorder)     Past Surgical History:  Procedure Laterality Date   CYSTECTOMY Left    breast cyst removed many years ago    There were no vitals filed for this visit.   Subjective Assessment - 10/23/24 2158     Currently in Pain? No/denies    Pain Score 0-No pain                Group Session:  S: Feeling better today  O: The primary objective of this topic is to explore and understand the concept of occupational balance in the context of daily living. The term occupational balance is defined broadly, encompassing all activities that occupy an individual's time and energy, including self-care, leisure, and work-related tasks. The goal is to guide participants towards achieving a harmonious blend of these activities, tailored to their personal values and life circumstances. This balance is aimed at enhancing overall well-being, not by equally distributing time across activities, but by ensuring that daily engagements are fulfilling and not draining. The content delves into identifying various barriers that individuals face in achieving occupational balance, such as overcommitment, misaligned priorities, external pressures, and lack of effective time management. The impact of  these barriers on occupational performance, roles, and lifestyles is examined, highlighting issues like reduced efficiency, strained relationships, and potential health problems. Strategies for cultivating occupational balance are a key focus. These strategies include practical methods like time blocking, prioritizing tasks, establishing self-care rituals, decluttering, connecting with nature, and engaging in reflective practices. These approaches are designed to be adaptable and applicable to a wide range of life scenarios, promoting a proactive and mindful approach to daily living. The overall aim is to equip participants with the knowledge and tools to create a balanced lifestyle that supports their mental, emotional, and physical health, thereby improving their functional performance in daily life.   A:  The patient demonstrated a high level of engagement and active participation throughout the session on occupational balance. The patient frequently contributed to discussions, offering insightful reflections on personal experiences related to the barriers and strategies for achieving occupational balance. There was a clear understanding of the concept and an ability to relate it to their own life. The patient showed enthusiasm in learning and applying the strategies discussed, such as time blocking and self-care rituals, indicating a strong motivation to improve their occupational balance. The patient's proactive approach and responsiveness to the topic suggest a high potential for implementing these strategies effectively in their daily routine.    P: Continue to attend PHP OT group sessions 5x week for 4 weeks to promote daily structure, social engagement, and opportunities to develop and utilize adaptive  strategies to maximize functional performance in preparation for safe transition and integration back into school, work, and the community. Plan to address topic of tbd in next OT group  session.                OT Education - 10/23/24 2159     Education Details Occupational Balance           OT Short Term Goals - 10/01/24 1251       OT SHORT TERM GOAL #1   Title Patient will identify and demonstrate at least two grounding or coping strategies to manage emotional distress, reporting decreased intensity of negative thoughts or anxiety during group participation and daily activities.    Time 4    Period Weeks    Status On-going    Target Date 10/09/24      OT SHORT TERM GOAL #2   Title atient will develop and implement a structured daily routine that incorporates at least one productive, one self-care, and one leisure activity to improve consistency, balance, and sense of purpose throughout the week.    Status On-going      OT SHORT TERM GOAL #3   Title Patient will identify one personal goal related to daily functioning or wellness and apply the SMART goal framework to create a clear, measurable, and achievable plan to improve engagement in meaningful roles.    Status On-going                   Plan - 10/23/24 2159     Psychosocial Skills Interpersonal Interaction;Habits;Environmental  Adaptations;Routines and Behaviors;Coping Strategies          Patient will benefit from skilled therapeutic intervention in order to improve the following deficits and impairments:       Psychosocial Skills: Interpersonal Interaction, Habits, Environmental  Adaptations, Routines and Behaviors, Coping Strategies   Visit Diagnosis: Difficulty coping    Problem List Patient Active Problem List   Diagnosis Date Noted   Anxiety disorder 02/21/2024   MDD (major depressive disorder), recurrent episode 02/12/2024   Alcohol use disorder, severe, in early remission (HCC) 10/22/2023   GAD (generalized anxiety disorder) 10/22/2023   Severe episode of recurrent major depressive disorder, without psychotic features (HCC) 10/22/2023   Hypophosphatemia  09/05/2023   Alcoholic ketoacidosis 09/04/2023   Lactic acidosis 09/04/2023   SIRS (systemic inflammatory response syndrome) (HCC) 09/04/2023   Transaminitis 09/04/2023   Elevated CK 09/04/2023   Hypokalemia 09/04/2023   Hypocalcemia 09/04/2023   COVID-19 virus infection 09/04/2023   Essential hypertension 09/04/2023   Aspiration into airway 09/04/2023   Alcohol use disorder 03/19/2023   Alcohol use disorder, moderate, dependence (HCC) 02/12/2023    [name], OT 10/23/2024, 9:59 PM  [name], OT   [facility] [address] [place], [place], [address] Phone: [phone]   Fax:  [phone]  Name: AVARAE ZWART MRN: [id number] Date of Birth: 08-Feb-1951
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
## 2024-11-03 NOTE — Psych (Signed)
 [facility] BH PHP THERAPIST PROGRESS NOTE  Stephanie Riley [id number]   Session Time: 9:00 am - 10:00 am  Participation Level: Active  Behavioral Response: CasualAlertAnxious and Depressed  Type of Therapy: Group Therapy  Treatment Goals addressed: Coping  Progress Towards Goals: Progressing  Interventions: CBT, DBT, Solution Focused, Strength-based, Supportive, and Reframing  Therapist Response: Clinician led check-in regarding current stressors and situation, and review of patient completed daily inventory. Clinician utilized active listening and empathetic response and validated patient emotions. Clinician facilitated processing group on pertinent issues.?   Summary: Patient arrived within time allowed. Patient rates their depression at a 9 and anxiety at a 9 on a scale of 1-10 with 10 being high. Pt reports they slept 7 hours last night and ate 3x yesterday. Pt reports struggle with rumination yesterday re: old friends. Pt states she rejoined [employer] and old friends verbalized they were happy to have her back and then have not said much since. Pt exhibits catastrophizing and personalization and is able to work on challenges with group. Pt identifies hopelessness during the rumination and denies SI/HI. Pt able to process.?Pt engaged in discussion.?       Session Time: 10:00 am - 11:00 am  Participation Level: Active  Behavioral Response: CasualAlertAnxious and Depressed  Type of Therapy: Group Therapy  Treatment Goals addressed: Coping  Progress Towards Goals: Progressing  Interventions: CBT, DBT, Solution Focused, Strength-based, Supportive, and Reframing  Therapist Response: Cln led processing group for pt's current struggles. Group members shared stressors and provided support and feedback. Cln brought in topics of boundaries, healthy relationships, and unhealthy thought processes to inform discussion.    Summary:  Pt able to process and provide support to group.           Session Time: 11:00 am - 12:00 pm   Participation Level: Active   Behavioral Response: CasualAlertAnxious   Type of Therapy: Group Therapy   Treatment Goals addressed: Coping   Progress Towards Goals: Progressing   Interventions: CBT, DBT, Solution Focused, Strength-based, Supportive, and Reframing   Therapist Response: Cln introduced DBT distress tolerance skill IMPROVE.  Cln discussed how this set of skills are for when you have to sit through an undesirable feeling and wait for it to pass. Group discussed how to apply the IMPROVE skills to decrease distress at the undesired feeling.    Summary:   Pt engaged in discussion and is able to identify ways to apply the skill.              Session Time: 12:00 pm - 12:30 pm   Participation Level: Active   Behavioral Response: CasualAlertAnxious   Type of Therapy: Group Therapy   Treatment Goals addressed: Coping   Progress Towards Goals: Progressing   Interventions: Psychologist, Occupational, Supportive   Therapist Response: Reflection Group: Patients encouraged to practice skills and interpersonal techniques or work on mindfulness and relaxation techniques. The importance of self-care and making skills part of a routine to increase usage were stressed.   Summary: Patient engaged and participated appropriately.           Session Time: 12:30 pm - 1:30 pm   Participation Level: Active   Behavioral Response: CasualAlertAnxious   Type of Therapy: Group Therapy   Treatment Goals addressed: Coping   Progress Towards Goals: Progressing   Interventions: Occupational Therapy   Therapist Response: Cln E Hollan led occupational therapy group.    Summary: See note  Session Time: 1:30 pm - 1:45 pm   Participation Level: Active   Behavioral Response: CasualAlertAnxious   Type of Therapy: Group Therapy   Treatment Goals addressed: Coping   Progress Towards Goals: Progressing   Interventions: CBT,  DBT, Solution Focused, Strength-based, Supportive, and Reframing   Therapist Response: 1:30 pm - 1:45 pm: Clinician led check-out. Clinician assessed for immediate needs, medication compliance and efficacy, and safety concerns?   Summary: 1:30 pm - 1:45 pm: At check-out, patient reports no immediate concerns. Patient demonstrates progress as evidenced by engagement and responsiveness to treatment. Patient denies SI/HI/self-harm thoughts at the end of group.   Suicidal/Homicidal: Nowithout intent/plan  Plan: ?Pt will continue in PHP and medication management while continuing to work on decreasing depression symptoms,?SI, and anxiety symptoms,?and increasing the ability to self manage symptoms.     Collaboration of Care: Medication Management AEB Staci Kerns, NP  Patient/Guardian was advised Release of Information must be obtained prior to any record release in order to collaborate their care with an outside provider. Patient/Guardian was advised if they have not already done so to contact the registration department to sign all necessary forms in order for us  to release information regarding their care.   Consent: Patient/Guardian gives verbal consent for treatment and assignment of benefits for services provided during this visit. Patient/Guardian expressed understanding and agreed to proceed.   Diagnosis: Severe episode of recurrent major depressive disorder, without psychotic features (HCC) [F33.2]    1. Severe episode of recurrent major depressive disorder, without psychotic features (HCC)   2. GAD (generalized anxiety disorder)       Stephanie Bastos, LCSW
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
## 2024-12-14 ENCOUNTER — Telehealth (HOSPITAL_COMMUNITY): Payer: Self-pay | Admitting: Psychiatry

## 2024-12-14 NOTE — Telephone Encounter (Signed)
 D:  Randall Bastos, LCSW informed the MH-IOP Case Manager that pt had reached out to her stating interest in virtual MH-IOP.  A:  Placed call to orient pt, but there was no answer.  Left vm requesting pt to call the case manager back.  Informed Randall.

## 2024-12-15 ENCOUNTER — Telehealth (HOSPITAL_COMMUNITY): Payer: Self-pay | Admitting: Psychiatry

## 2024-12-15 NOTE — Telephone Encounter (Signed)
 D:  Placed another call to pt re: virtual MH-IOP.  Inquired when does pt want to start virtual MH-IOP?  Patient states she had talked to [place] (PHP group facilitator).  I told Randall that I know I was having issues with my insurance but I wanted to attend IOP.   Informed pt that MH-IOP takes [employer].  Ok, I was wanting to do the group with Kayla.  Case Manager informed pt that she couldn't confirm or deny who is in the group.  Asked pt again, when would she like to start?  Well, I want to talk to [facility] first and see.  Encouraged pt to give the case manager a call whenever she decides.  A:  Inform treatment team.  R:  Pt receptive.
